# Patient Record
Sex: Female | Born: 1950 | Race: White | Hispanic: No | Smoking: Current every day smoker
Health system: Southern US, Community
[De-identification: ages and names within clinical notes are randomized; demographics above are authoritative.]

## PROBLEM LIST (undated history)

## (undated) DIAGNOSIS — K449 Diaphragmatic hernia without obstruction or gangrene: Secondary | ICD-10-CM

## (undated) DIAGNOSIS — I739 Peripheral vascular disease, unspecified: Secondary | ICD-10-CM

## (undated) DIAGNOSIS — G459 Transient cerebral ischemic attack, unspecified: Secondary | ICD-10-CM

## (undated) DIAGNOSIS — I509 Heart failure, unspecified: Secondary | ICD-10-CM

## (undated) DIAGNOSIS — Z87891 Personal history of nicotine dependence: Secondary | ICD-10-CM

## (undated) DIAGNOSIS — K222 Esophageal obstruction: Secondary | ICD-10-CM

## (undated) DIAGNOSIS — J449 Chronic obstructive pulmonary disease, unspecified: Secondary | ICD-10-CM

## (undated) DIAGNOSIS — E785 Hyperlipidemia, unspecified: Secondary | ICD-10-CM

## (undated) DIAGNOSIS — F329 Major depressive disorder, single episode, unspecified: Secondary | ICD-10-CM

## (undated) DIAGNOSIS — I10 Essential (primary) hypertension: Secondary | ICD-10-CM

## (undated) DIAGNOSIS — I779 Disorder of arteries and arterioles, unspecified: Secondary | ICD-10-CM

## (undated) DIAGNOSIS — F3289 Other specified depressive episodes: Secondary | ICD-10-CM

## (undated) DIAGNOSIS — I2510 Atherosclerotic heart disease of native coronary artery without angina pectoris: Secondary | ICD-10-CM

## (undated) DIAGNOSIS — A0472 Enterocolitis due to Clostridium difficile, not specified as recurrent: Secondary | ICD-10-CM

## (undated) DIAGNOSIS — F411 Generalized anxiety disorder: Secondary | ICD-10-CM

## (undated) DIAGNOSIS — Z923 Personal history of irradiation: Secondary | ICD-10-CM

## (undated) DIAGNOSIS — K219 Gastro-esophageal reflux disease without esophagitis: Secondary | ICD-10-CM

## (undated) DIAGNOSIS — Z9981 Dependence on supplemental oxygen: Secondary | ICD-10-CM

## (undated) DIAGNOSIS — C3490 Malignant neoplasm of unspecified part of unspecified bronchus or lung: Secondary | ICD-10-CM

## (undated) DIAGNOSIS — R51 Headache: Secondary | ICD-10-CM

## (undated) DIAGNOSIS — D126 Benign neoplasm of colon, unspecified: Secondary | ICD-10-CM

## (undated) DIAGNOSIS — I219 Acute myocardial infarction, unspecified: Secondary | ICD-10-CM

## (undated) DIAGNOSIS — R634 Abnormal weight loss: Secondary | ICD-10-CM

## (undated) DIAGNOSIS — K279 Peptic ulcer, site unspecified, unspecified as acute or chronic, without hemorrhage or perforation: Secondary | ICD-10-CM

## (undated) DIAGNOSIS — B3781 Candidal esophagitis: Secondary | ICD-10-CM

## (undated) DIAGNOSIS — J45909 Unspecified asthma, uncomplicated: Secondary | ICD-10-CM

## (undated) DIAGNOSIS — K224 Dyskinesia of esophagus: Secondary | ICD-10-CM

## (undated) DIAGNOSIS — K2940 Chronic atrophic gastritis without bleeding: Secondary | ICD-10-CM

## (undated) DIAGNOSIS — G629 Polyneuropathy, unspecified: Secondary | ICD-10-CM

## (undated) DIAGNOSIS — I4891 Unspecified atrial fibrillation: Secondary | ICD-10-CM

## (undated) DIAGNOSIS — Z9289 Personal history of other medical treatment: Secondary | ICD-10-CM

## (undated) HISTORY — DX: Personal history of other medical treatment: Z92.89

## (undated) HISTORY — DX: Diaphragmatic hernia without obstruction or gangrene: K44.9

## (undated) HISTORY — DX: Candidal esophagitis: B37.81

## (undated) HISTORY — DX: Chronic atrophic gastritis without bleeding: K29.40

## (undated) HISTORY — PX: CARPAL TUNNEL RELEASE: SHX101

## (undated) HISTORY — DX: Transient cerebral ischemic attack, unspecified: G45.9

## (undated) HISTORY — DX: Benign neoplasm of colon, unspecified: D12.6

## (undated) HISTORY — DX: Major depressive disorder, single episode, unspecified: F32.9

## (undated) HISTORY — PX: SUBCLAVIAN VEIN ANGIOPLASTY / STENTING: SHX2453

## (undated) HISTORY — DX: Acute myocardial infarction, unspecified: I21.9

## (undated) HISTORY — DX: Personal history of irradiation: Z92.3

## (undated) HISTORY — DX: Peptic ulcer, site unspecified, unspecified as acute or chronic, without hemorrhage or perforation: K27.9

## (undated) HISTORY — DX: Dyskinesia of esophagus: K22.4

## (undated) HISTORY — DX: Hyperlipidemia, unspecified: E78.5

## (undated) HISTORY — DX: Disorder of arteries and arterioles, unspecified: I77.9

## (undated) HISTORY — DX: Chronic obstructive pulmonary disease, unspecified: J44.9

## (undated) HISTORY — DX: Polyneuropathy, unspecified: G62.9

## (undated) HISTORY — DX: Gastro-esophageal reflux disease without esophagitis: K21.9

## (undated) HISTORY — DX: Peripheral vascular disease, unspecified: I73.9

## (undated) HISTORY — DX: Malignant neoplasm of unspecified part of unspecified bronchus or lung: C34.90

## (undated) HISTORY — DX: Enterocolitis due to Clostridium difficile, not specified as recurrent: A04.72

## (undated) HISTORY — DX: Esophageal obstruction: K22.2

## (undated) HISTORY — DX: Generalized anxiety disorder: F41.1

## (undated) HISTORY — PX: LAMINECTOMY: SHX219

## (undated) HISTORY — DX: Other specified depressive episodes: F32.89

## (undated) HISTORY — DX: Personal history of nicotine dependence: Z87.891

## (undated) HISTORY — PX: BREAST SURGERY: SHX581

## (undated) HISTORY — DX: Headache: R51

## (undated) HISTORY — DX: Abnormal weight loss: R63.4

---

## 1972-05-24 HISTORY — PX: TONSILLECTOMY: SUR1361

## 1974-05-24 HISTORY — PX: MANDIBLE FRACTURE SURGERY: SHX706

## 1994-02-01 HISTORY — PX: CORONARY ARTERY BYPASS GRAFT: SHX141

## 1996-05-24 HISTORY — PX: VAGINAL HYSTERECTOMY: SUR661

## 1997-10-24 ENCOUNTER — Inpatient Hospital Stay (HOSPITAL_COMMUNITY): Admission: EM | Admit: 1997-10-24 | Discharge: 1997-10-25 | Payer: Self-pay | Admitting: Emergency Medicine

## 1997-11-14 ENCOUNTER — Ambulatory Visit (HOSPITAL_COMMUNITY): Admission: RE | Admit: 1997-11-14 | Discharge: 1997-11-14 | Payer: Self-pay | Admitting: Internal Medicine

## 1998-04-04 ENCOUNTER — Encounter: Payer: Self-pay | Admitting: Internal Medicine

## 1998-04-04 ENCOUNTER — Ambulatory Visit (HOSPITAL_COMMUNITY): Admission: RE | Admit: 1998-04-04 | Discharge: 1998-04-04 | Payer: Self-pay | Admitting: Internal Medicine

## 1998-04-13 ENCOUNTER — Emergency Department (HOSPITAL_COMMUNITY): Admission: EM | Admit: 1998-04-13 | Discharge: 1998-04-13 | Payer: Self-pay | Admitting: Emergency Medicine

## 1998-04-13 ENCOUNTER — Encounter: Payer: Self-pay | Admitting: Emergency Medicine

## 1998-04-29 ENCOUNTER — Observation Stay (HOSPITAL_COMMUNITY): Admission: RE | Admit: 1998-04-29 | Discharge: 1998-04-30 | Payer: Self-pay | Admitting: Neurosurgery

## 1998-04-29 ENCOUNTER — Encounter: Payer: Self-pay | Admitting: Neurosurgery

## 1999-02-04 ENCOUNTER — Ambulatory Visit (HOSPITAL_COMMUNITY): Admission: RE | Admit: 1999-02-04 | Discharge: 1999-02-04 | Payer: Self-pay | Admitting: Cardiovascular Disease

## 1999-02-04 ENCOUNTER — Encounter: Payer: Self-pay | Admitting: Cardiovascular Disease

## 1999-05-15 ENCOUNTER — Other Ambulatory Visit: Admission: RE | Admit: 1999-05-15 | Discharge: 1999-05-15 | Payer: Self-pay | Admitting: Internal Medicine

## 1999-05-20 ENCOUNTER — Encounter: Payer: Self-pay | Admitting: Internal Medicine

## 1999-05-20 ENCOUNTER — Encounter: Admission: RE | Admit: 1999-05-20 | Discharge: 1999-05-20 | Payer: Self-pay | Admitting: Internal Medicine

## 1999-06-10 ENCOUNTER — Encounter: Admission: RE | Admit: 1999-06-10 | Discharge: 1999-06-10 | Payer: Self-pay | Admitting: Allergy

## 1999-06-10 ENCOUNTER — Encounter: Payer: Self-pay | Admitting: Allergy

## 1999-06-25 ENCOUNTER — Ambulatory Visit (HOSPITAL_COMMUNITY): Admission: RE | Admit: 1999-06-25 | Discharge: 1999-06-25 | Payer: Self-pay | Admitting: Gastroenterology

## 1999-09-10 ENCOUNTER — Encounter: Payer: Self-pay | Admitting: Emergency Medicine

## 1999-09-10 ENCOUNTER — Encounter: Payer: Self-pay | Admitting: Cardiovascular Disease

## 1999-09-10 ENCOUNTER — Inpatient Hospital Stay (HOSPITAL_COMMUNITY): Admission: EM | Admit: 1999-09-10 | Discharge: 1999-09-16 | Payer: Self-pay | Admitting: Emergency Medicine

## 1999-09-13 ENCOUNTER — Encounter: Payer: Self-pay | Admitting: Cardiovascular Disease

## 1999-09-14 ENCOUNTER — Encounter: Payer: Self-pay | Admitting: *Deleted

## 1999-09-15 ENCOUNTER — Encounter: Payer: Self-pay | Admitting: Cardiovascular Disease

## 2000-02-03 ENCOUNTER — Encounter: Payer: Self-pay | Admitting: Internal Medicine

## 2000-02-03 ENCOUNTER — Encounter: Admission: RE | Admit: 2000-02-03 | Discharge: 2000-02-03 | Payer: Self-pay | Admitting: Internal Medicine

## 2000-05-11 ENCOUNTER — Ambulatory Visit (HOSPITAL_COMMUNITY): Admission: RE | Admit: 2000-05-11 | Discharge: 2000-05-11 | Payer: Self-pay | Admitting: Cardiovascular Disease

## 2000-05-11 ENCOUNTER — Encounter: Payer: Self-pay | Admitting: Cardiovascular Disease

## 2000-08-28 ENCOUNTER — Inpatient Hospital Stay (HOSPITAL_COMMUNITY): Admission: EM | Admit: 2000-08-28 | Discharge: 2000-08-30 | Payer: Self-pay | Admitting: Emergency Medicine

## 2000-08-28 ENCOUNTER — Encounter: Payer: Self-pay | Admitting: Emergency Medicine

## 2000-08-29 HISTORY — PX: CORONARY ANGIOPLASTY WITH STENT PLACEMENT: SHX49

## 2000-10-28 ENCOUNTER — Emergency Department (HOSPITAL_COMMUNITY): Admission: EM | Admit: 2000-10-28 | Discharge: 2000-10-28 | Payer: Self-pay | Admitting: Emergency Medicine

## 2000-10-28 ENCOUNTER — Encounter: Payer: Self-pay | Admitting: Emergency Medicine

## 2000-11-03 ENCOUNTER — Ambulatory Visit (HOSPITAL_COMMUNITY): Admission: RE | Admit: 2000-11-03 | Discharge: 2000-11-03 | Payer: Self-pay | Admitting: Gastroenterology

## 2001-02-07 ENCOUNTER — Inpatient Hospital Stay (HOSPITAL_COMMUNITY): Admission: EM | Admit: 2001-02-07 | Discharge: 2001-02-08 | Payer: Self-pay

## 2001-02-07 ENCOUNTER — Encounter: Payer: Self-pay | Admitting: Cardiovascular Disease

## 2001-02-22 ENCOUNTER — Encounter: Admission: RE | Admit: 2001-02-22 | Discharge: 2001-02-22 | Payer: Self-pay | Admitting: Internal Medicine

## 2001-02-22 ENCOUNTER — Encounter: Payer: Self-pay | Admitting: Internal Medicine

## 2001-02-23 ENCOUNTER — Ambulatory Visit (HOSPITAL_COMMUNITY): Admission: RE | Admit: 2001-02-23 | Discharge: 2001-02-23 | Payer: Self-pay | Admitting: Neurosurgery

## 2001-02-23 ENCOUNTER — Encounter: Payer: Self-pay | Admitting: Neurosurgery

## 2001-02-27 ENCOUNTER — Other Ambulatory Visit: Admission: RE | Admit: 2001-02-27 | Discharge: 2001-02-27 | Payer: Self-pay | Admitting: *Deleted

## 2001-03-06 ENCOUNTER — Encounter: Admission: RE | Admit: 2001-03-06 | Discharge: 2001-06-04 | Payer: Self-pay | Admitting: Anesthesiology

## 2001-06-09 ENCOUNTER — Encounter: Admission: RE | Admit: 2001-06-09 | Discharge: 2001-09-07 | Payer: Self-pay | Admitting: Anesthesiology

## 2001-07-15 ENCOUNTER — Encounter: Payer: Self-pay | Admitting: Emergency Medicine

## 2001-07-15 ENCOUNTER — Emergency Department (HOSPITAL_COMMUNITY): Admission: EM | Admit: 2001-07-15 | Discharge: 2001-07-15 | Payer: Self-pay | Admitting: Emergency Medicine

## 2001-09-22 ENCOUNTER — Encounter: Payer: Self-pay | Admitting: Emergency Medicine

## 2001-09-22 ENCOUNTER — Inpatient Hospital Stay (HOSPITAL_COMMUNITY): Admission: EM | Admit: 2001-09-22 | Discharge: 2001-09-26 | Payer: Self-pay | Admitting: Emergency Medicine

## 2001-09-23 ENCOUNTER — Encounter: Payer: Self-pay | Admitting: Cardiovascular Disease

## 2002-01-03 ENCOUNTER — Observation Stay (HOSPITAL_COMMUNITY): Admission: EM | Admit: 2002-01-03 | Discharge: 2002-01-04 | Payer: Self-pay | Admitting: Emergency Medicine

## 2002-01-03 ENCOUNTER — Encounter: Payer: Self-pay | Admitting: Emergency Medicine

## 2002-01-16 ENCOUNTER — Inpatient Hospital Stay (HOSPITAL_COMMUNITY): Admission: EM | Admit: 2002-01-16 | Discharge: 2002-01-19 | Payer: Self-pay | Admitting: *Deleted

## 2002-01-17 HISTORY — PX: CARDIAC CATHETERIZATION: SHX172

## 2002-01-23 ENCOUNTER — Encounter (INDEPENDENT_AMBULATORY_CARE_PROVIDER_SITE_OTHER): Payer: Self-pay | Admitting: *Deleted

## 2002-01-23 ENCOUNTER — Encounter: Payer: Self-pay | Admitting: Cardiology

## 2002-01-23 ENCOUNTER — Encounter: Admission: RE | Admit: 2002-01-23 | Discharge: 2002-01-23 | Payer: Self-pay | Admitting: Cardiology

## 2002-03-05 ENCOUNTER — Other Ambulatory Visit: Admission: RE | Admit: 2002-03-05 | Discharge: 2002-03-05 | Payer: Self-pay | Admitting: *Deleted

## 2002-06-08 ENCOUNTER — Inpatient Hospital Stay (HOSPITAL_COMMUNITY): Admission: EM | Admit: 2002-06-08 | Discharge: 2002-06-09 | Payer: Self-pay | Admitting: Emergency Medicine

## 2002-06-08 ENCOUNTER — Encounter: Payer: Self-pay | Admitting: Emergency Medicine

## 2003-06-03 ENCOUNTER — Emergency Department (HOSPITAL_COMMUNITY): Admission: EM | Admit: 2003-06-03 | Discharge: 2003-06-03 | Payer: Self-pay | Admitting: Emergency Medicine

## 2003-07-29 ENCOUNTER — Other Ambulatory Visit: Admission: RE | Admit: 2003-07-29 | Discharge: 2003-07-29 | Payer: Self-pay | Admitting: *Deleted

## 2003-08-22 ENCOUNTER — Encounter: Admission: RE | Admit: 2003-08-22 | Discharge: 2003-08-22 | Payer: Self-pay | Admitting: Internal Medicine

## 2003-10-14 ENCOUNTER — Encounter (INDEPENDENT_AMBULATORY_CARE_PROVIDER_SITE_OTHER): Payer: Self-pay | Admitting: *Deleted

## 2003-10-14 ENCOUNTER — Ambulatory Visit (HOSPITAL_COMMUNITY): Admission: RE | Admit: 2003-10-14 | Discharge: 2003-10-14 | Payer: Self-pay | Admitting: Gastroenterology

## 2003-10-31 ENCOUNTER — Ambulatory Visit (HOSPITAL_COMMUNITY): Admission: RE | Admit: 2003-10-31 | Discharge: 2003-10-31 | Payer: Self-pay | Admitting: Gastroenterology

## 2003-10-31 ENCOUNTER — Encounter (INDEPENDENT_AMBULATORY_CARE_PROVIDER_SITE_OTHER): Payer: Self-pay | Admitting: *Deleted

## 2004-02-12 ENCOUNTER — Inpatient Hospital Stay (HOSPITAL_COMMUNITY): Admission: AD | Admit: 2004-02-12 | Discharge: 2004-02-14 | Payer: Self-pay | Admitting: Cardiovascular Disease

## 2004-02-13 HISTORY — PX: CARDIAC CATHETERIZATION: SHX172

## 2004-11-04 ENCOUNTER — Ambulatory Visit: Payer: Self-pay | Admitting: Gastroenterology

## 2004-12-29 ENCOUNTER — Emergency Department (HOSPITAL_COMMUNITY): Admission: EM | Admit: 2004-12-29 | Discharge: 2004-12-29 | Payer: Self-pay | Admitting: Family Medicine

## 2005-01-08 ENCOUNTER — Ambulatory Visit: Payer: Self-pay | Admitting: Gastroenterology

## 2005-01-21 ENCOUNTER — Encounter: Payer: Self-pay | Admitting: Internal Medicine

## 2005-01-21 ENCOUNTER — Encounter (INDEPENDENT_AMBULATORY_CARE_PROVIDER_SITE_OTHER): Payer: Self-pay | Admitting: *Deleted

## 2005-01-21 ENCOUNTER — Ambulatory Visit: Payer: Self-pay | Admitting: Gastroenterology

## 2005-01-21 DIAGNOSIS — D126 Benign neoplasm of colon, unspecified: Secondary | ICD-10-CM | POA: Insufficient documentation

## 2005-01-21 DIAGNOSIS — K649 Unspecified hemorrhoids: Secondary | ICD-10-CM | POA: Insufficient documentation

## 2005-06-08 ENCOUNTER — Emergency Department (HOSPITAL_COMMUNITY): Admission: EM | Admit: 2005-06-08 | Discharge: 2005-06-08 | Payer: Self-pay | Admitting: Emergency Medicine

## 2005-07-13 ENCOUNTER — Observation Stay (HOSPITAL_COMMUNITY): Admission: EM | Admit: 2005-07-13 | Discharge: 2005-07-15 | Payer: Self-pay | Admitting: Emergency Medicine

## 2005-07-14 ENCOUNTER — Ambulatory Visit: Payer: Self-pay | Admitting: Internal Medicine

## 2005-07-14 HISTORY — PX: CARDIAC CATHETERIZATION: SHX172

## 2005-07-15 ENCOUNTER — Encounter (INDEPENDENT_AMBULATORY_CARE_PROVIDER_SITE_OTHER): Payer: Self-pay | Admitting: *Deleted

## 2005-11-17 ENCOUNTER — Emergency Department (HOSPITAL_COMMUNITY): Admission: EM | Admit: 2005-11-17 | Discharge: 2005-11-17 | Payer: Self-pay | Admitting: Family Medicine

## 2006-02-10 ENCOUNTER — Other Ambulatory Visit: Admission: RE | Admit: 2006-02-10 | Discharge: 2006-02-10 | Payer: Self-pay | Admitting: *Deleted

## 2006-03-16 ENCOUNTER — Ambulatory Visit: Payer: Self-pay | Admitting: Gastroenterology

## 2006-03-17 ENCOUNTER — Ambulatory Visit: Payer: Self-pay | Admitting: Gastroenterology

## 2006-03-17 DIAGNOSIS — K2940 Chronic atrophic gastritis without bleeding: Secondary | ICD-10-CM | POA: Insufficient documentation

## 2006-03-17 DIAGNOSIS — K449 Diaphragmatic hernia without obstruction or gangrene: Secondary | ICD-10-CM | POA: Insufficient documentation

## 2006-03-27 ENCOUNTER — Emergency Department (HOSPITAL_COMMUNITY): Admission: EM | Admit: 2006-03-27 | Discharge: 2006-03-27 | Payer: Self-pay | Admitting: Emergency Medicine

## 2006-04-05 ENCOUNTER — Encounter: Admission: RE | Admit: 2006-04-05 | Discharge: 2006-04-05 | Payer: Self-pay | Admitting: Internal Medicine

## 2006-05-24 HISTORY — PX: EPIGASTRIC HERNIA REPAIR: SUR1177

## 2006-07-19 ENCOUNTER — Ambulatory Visit: Payer: Self-pay | Admitting: Gastroenterology

## 2006-07-22 ENCOUNTER — Inpatient Hospital Stay (HOSPITAL_COMMUNITY): Admission: EM | Admit: 2006-07-22 | Discharge: 2006-07-24 | Payer: Self-pay | Admitting: Emergency Medicine

## 2006-07-24 ENCOUNTER — Encounter (INDEPENDENT_AMBULATORY_CARE_PROVIDER_SITE_OTHER): Payer: Self-pay | Admitting: *Deleted

## 2006-08-02 ENCOUNTER — Encounter: Admission: RE | Admit: 2006-08-02 | Discharge: 2006-08-02 | Payer: Self-pay | Admitting: Gastroenterology

## 2006-08-02 ENCOUNTER — Ambulatory Visit: Payer: Self-pay | Admitting: Thoracic Surgery (Cardiothoracic Vascular Surgery)

## 2006-08-23 ENCOUNTER — Ambulatory Visit: Payer: Self-pay | Admitting: Thoracic Surgery (Cardiothoracic Vascular Surgery)

## 2006-08-23 ENCOUNTER — Encounter (INDEPENDENT_AMBULATORY_CARE_PROVIDER_SITE_OTHER): Payer: Self-pay | Admitting: *Deleted

## 2006-08-23 ENCOUNTER — Observation Stay (HOSPITAL_COMMUNITY)
Admission: RE | Admit: 2006-08-23 | Discharge: 2006-08-23 | Payer: Self-pay | Admitting: Thoracic Surgery (Cardiothoracic Vascular Surgery)

## 2006-09-08 ENCOUNTER — Ambulatory Visit: Payer: Self-pay | Admitting: Thoracic Surgery (Cardiothoracic Vascular Surgery)

## 2006-11-01 ENCOUNTER — Encounter
Admission: RE | Admit: 2006-11-01 | Discharge: 2006-11-01 | Payer: Self-pay | Admitting: Thoracic Surgery (Cardiothoracic Vascular Surgery)

## 2006-11-01 ENCOUNTER — Ambulatory Visit: Payer: Self-pay | Admitting: Thoracic Surgery (Cardiothoracic Vascular Surgery)

## 2006-12-13 ENCOUNTER — Ambulatory Visit: Payer: Self-pay | Admitting: Internal Medicine

## 2006-12-13 LAB — CONVERTED CEMR LAB
Basophils Absolute: 0 10*3/uL (ref 0.0–0.1)
Eosinophils Absolute: 0 10*3/uL (ref 0.0–0.6)
Eosinophils Relative: 0.3 % (ref 0.0–5.0)
HCT: 41.5 % (ref 36.0–46.0)
Lymphocytes Relative: 32.1 % (ref 12.0–46.0)
Monocytes Absolute: 0.4 10*3/uL (ref 0.2–0.7)
Neutro Abs: 4.5 10*3/uL (ref 1.4–7.7)
Platelets: 279 10*3/uL (ref 150–400)
RBC: 4.43 M/uL (ref 3.87–5.11)
RDW: 12 % (ref 11.5–14.6)
Sed Rate: 11 mm/hr (ref 0–25)
TSH: 0.92 microintl units/mL (ref 0.35–5.50)
Tissue Transglutaminase Ab, IgA: 0 units (ref <7)
WBC: 7.2 10*3/uL (ref 4.5–10.5)

## 2006-12-15 ENCOUNTER — Encounter: Payer: Self-pay | Admitting: Internal Medicine

## 2006-12-18 ENCOUNTER — Emergency Department (HOSPITAL_COMMUNITY): Admission: EM | Admit: 2006-12-18 | Discharge: 2006-12-18 | Payer: Self-pay | Admitting: Family Medicine

## 2006-12-31 ENCOUNTER — Emergency Department (HOSPITAL_COMMUNITY): Admission: EM | Admit: 2006-12-31 | Discharge: 2006-12-31 | Payer: Self-pay | Admitting: Emergency Medicine

## 2007-02-19 ENCOUNTER — Emergency Department (HOSPITAL_COMMUNITY): Admission: EM | Admit: 2007-02-19 | Discharge: 2007-02-19 | Payer: Self-pay | Admitting: Family Medicine

## 2007-03-06 ENCOUNTER — Ambulatory Visit: Payer: Self-pay | Admitting: Internal Medicine

## 2007-03-21 ENCOUNTER — Ambulatory Visit: Payer: Self-pay | Admitting: Internal Medicine

## 2007-03-21 ENCOUNTER — Encounter: Payer: Self-pay | Admitting: Internal Medicine

## 2007-03-27 ENCOUNTER — Emergency Department (HOSPITAL_COMMUNITY): Admission: EM | Admit: 2007-03-27 | Discharge: 2007-03-27 | Payer: Self-pay | Admitting: Emergency Medicine

## 2007-03-28 ENCOUNTER — Emergency Department (HOSPITAL_COMMUNITY): Admission: EM | Admit: 2007-03-28 | Discharge: 2007-03-28 | Payer: Self-pay | Admitting: Emergency Medicine

## 2007-03-28 ENCOUNTER — Encounter (INDEPENDENT_AMBULATORY_CARE_PROVIDER_SITE_OTHER): Payer: Self-pay | Admitting: *Deleted

## 2007-03-29 ENCOUNTER — Emergency Department (HOSPITAL_COMMUNITY): Admission: EM | Admit: 2007-03-29 | Discharge: 2007-03-29 | Payer: Self-pay | Admitting: Emergency Medicine

## 2007-04-14 ENCOUNTER — Inpatient Hospital Stay (HOSPITAL_COMMUNITY): Admission: RE | Admit: 2007-04-14 | Discharge: 2007-04-20 | Payer: Self-pay | Admitting: Neurosurgery

## 2007-04-23 ENCOUNTER — Inpatient Hospital Stay (HOSPITAL_COMMUNITY): Admission: EM | Admit: 2007-04-23 | Discharge: 2007-04-25 | Payer: Self-pay | Admitting: Emergency Medicine

## 2007-04-28 ENCOUNTER — Emergency Department (HOSPITAL_COMMUNITY): Admission: EM | Admit: 2007-04-28 | Discharge: 2007-04-28 | Payer: Self-pay | Admitting: Emergency Medicine

## 2007-04-30 ENCOUNTER — Inpatient Hospital Stay (HOSPITAL_COMMUNITY): Admission: EM | Admit: 2007-04-30 | Discharge: 2007-05-01 | Payer: Self-pay | Admitting: Emergency Medicine

## 2007-05-06 ENCOUNTER — Emergency Department (HOSPITAL_COMMUNITY): Admission: EM | Admit: 2007-05-06 | Discharge: 2007-05-06 | Payer: Self-pay | Admitting: Emergency Medicine

## 2007-05-22 ENCOUNTER — Observation Stay (HOSPITAL_COMMUNITY): Admission: EM | Admit: 2007-05-22 | Discharge: 2007-05-23 | Payer: Self-pay | Admitting: *Deleted

## 2007-07-30 ENCOUNTER — Encounter: Admission: RE | Admit: 2007-07-30 | Discharge: 2007-07-30 | Payer: Self-pay | Admitting: Neurosurgery

## 2007-07-31 DIAGNOSIS — K279 Peptic ulcer, site unspecified, unspecified as acute or chronic, without hemorrhage or perforation: Secondary | ICD-10-CM | POA: Insufficient documentation

## 2007-07-31 DIAGNOSIS — F329 Major depressive disorder, single episode, unspecified: Secondary | ICD-10-CM

## 2007-07-31 DIAGNOSIS — J45909 Unspecified asthma, uncomplicated: Secondary | ICD-10-CM | POA: Insufficient documentation

## 2007-07-31 DIAGNOSIS — F411 Generalized anxiety disorder: Secondary | ICD-10-CM

## 2007-07-31 DIAGNOSIS — E785 Hyperlipidemia, unspecified: Secondary | ICD-10-CM

## 2007-07-31 DIAGNOSIS — K219 Gastro-esophageal reflux disease without esophagitis: Secondary | ICD-10-CM | POA: Insufficient documentation

## 2007-07-31 DIAGNOSIS — I219 Acute myocardial infarction, unspecified: Secondary | ICD-10-CM | POA: Insufficient documentation

## 2007-07-31 DIAGNOSIS — I2510 Atherosclerotic heart disease of native coronary artery without angina pectoris: Secondary | ICD-10-CM

## 2007-08-09 ENCOUNTER — Emergency Department (HOSPITAL_COMMUNITY): Admission: EM | Admit: 2007-08-09 | Discharge: 2007-08-09 | Payer: Self-pay | Admitting: Emergency Medicine

## 2007-08-24 ENCOUNTER — Encounter: Admission: RE | Admit: 2007-08-24 | Discharge: 2007-09-21 | Payer: Self-pay | Admitting: Neurosurgery

## 2008-01-25 ENCOUNTER — Emergency Department (HOSPITAL_COMMUNITY): Admission: EM | Admit: 2008-01-25 | Discharge: 2008-01-25 | Payer: Self-pay | Admitting: Emergency Medicine

## 2008-06-30 ENCOUNTER — Emergency Department (HOSPITAL_COMMUNITY): Admission: EM | Admit: 2008-06-30 | Discharge: 2008-06-30 | Payer: Self-pay | Admitting: Emergency Medicine

## 2008-09-20 ENCOUNTER — Observation Stay (HOSPITAL_COMMUNITY): Admission: EM | Admit: 2008-09-20 | Discharge: 2008-09-21 | Payer: Self-pay | Admitting: Emergency Medicine

## 2008-09-23 ENCOUNTER — Telehealth: Payer: Self-pay | Admitting: Internal Medicine

## 2008-09-26 ENCOUNTER — Ambulatory Visit (HOSPITAL_COMMUNITY): Admission: RE | Admit: 2008-09-26 | Discharge: 2008-09-26 | Payer: Self-pay | Admitting: Cardiovascular Disease

## 2008-09-26 HISTORY — PX: CARDIAC CATHETERIZATION: SHX172

## 2009-01-10 ENCOUNTER — Encounter (INDEPENDENT_AMBULATORY_CARE_PROVIDER_SITE_OTHER): Payer: Self-pay | Admitting: *Deleted

## 2009-05-15 ENCOUNTER — Encounter: Admission: RE | Admit: 2009-05-15 | Discharge: 2009-05-15 | Payer: Self-pay | Admitting: Internal Medicine

## 2009-05-24 DIAGNOSIS — I219 Acute myocardial infarction, unspecified: Secondary | ICD-10-CM

## 2009-05-24 DIAGNOSIS — I509 Heart failure, unspecified: Secondary | ICD-10-CM

## 2009-05-24 HISTORY — DX: Heart failure, unspecified: I50.9

## 2009-05-24 HISTORY — DX: Acute myocardial infarction, unspecified: I21.9

## 2009-05-31 ENCOUNTER — Ambulatory Visit: Payer: Self-pay | Admitting: Internal Medicine

## 2009-05-31 ENCOUNTER — Inpatient Hospital Stay (HOSPITAL_COMMUNITY): Admission: EM | Admit: 2009-05-31 | Discharge: 2009-06-07 | Payer: Self-pay | Admitting: Emergency Medicine

## 2009-05-31 HISTORY — PX: CARDIAC CATHETERIZATION: SHX172

## 2009-06-02 ENCOUNTER — Encounter (INDEPENDENT_AMBULATORY_CARE_PROVIDER_SITE_OTHER): Payer: Self-pay | Admitting: Cardiovascular Disease

## 2009-06-04 ENCOUNTER — Encounter: Payer: Self-pay | Admitting: Internal Medicine

## 2009-06-18 ENCOUNTER — Encounter: Payer: Self-pay | Admitting: Internal Medicine

## 2009-07-25 ENCOUNTER — Inpatient Hospital Stay (HOSPITAL_COMMUNITY): Admission: EM | Admit: 2009-07-25 | Discharge: 2009-07-29 | Payer: Self-pay | Admitting: Emergency Medicine

## 2009-07-29 ENCOUNTER — Encounter (INDEPENDENT_AMBULATORY_CARE_PROVIDER_SITE_OTHER): Payer: Self-pay | Admitting: *Deleted

## 2009-08-14 HISTORY — PX: TRANSTHORACIC ECHOCARDIOGRAM: SHX275

## 2009-08-14 HISTORY — PX: OTHER SURGICAL HISTORY: SHX169

## 2009-08-20 ENCOUNTER — Encounter: Admission: RE | Admit: 2009-08-20 | Discharge: 2009-08-20 | Payer: Self-pay | Admitting: Neurosurgery

## 2009-08-22 ENCOUNTER — Encounter: Payer: Self-pay | Admitting: Internal Medicine

## 2009-10-02 ENCOUNTER — Observation Stay (HOSPITAL_COMMUNITY): Admission: EM | Admit: 2009-10-02 | Discharge: 2009-10-03 | Payer: Self-pay | Admitting: Emergency Medicine

## 2009-10-02 ENCOUNTER — Encounter (INDEPENDENT_AMBULATORY_CARE_PROVIDER_SITE_OTHER): Payer: Self-pay | Admitting: *Deleted

## 2009-10-03 ENCOUNTER — Encounter (INDEPENDENT_AMBULATORY_CARE_PROVIDER_SITE_OTHER): Payer: Self-pay | Admitting: *Deleted

## 2009-10-03 HISTORY — PX: CARDIAC CATHETERIZATION: SHX172

## 2009-11-03 ENCOUNTER — Ambulatory Visit: Payer: Self-pay | Admitting: Internal Medicine

## 2009-11-06 ENCOUNTER — Ambulatory Visit: Payer: Self-pay | Admitting: Internal Medicine

## 2009-11-07 ENCOUNTER — Telehealth: Payer: Self-pay | Admitting: Internal Medicine

## 2009-11-10 ENCOUNTER — Encounter: Payer: Self-pay | Admitting: Internal Medicine

## 2009-12-03 ENCOUNTER — Telehealth: Payer: Self-pay | Admitting: Internal Medicine

## 2009-12-04 ENCOUNTER — Ambulatory Visit: Payer: Self-pay | Admitting: Internal Medicine

## 2009-12-04 DIAGNOSIS — B3781 Candidal esophagitis: Secondary | ICD-10-CM | POA: Insufficient documentation

## 2009-12-04 DIAGNOSIS — R195 Other fecal abnormalities: Secondary | ICD-10-CM

## 2010-02-06 ENCOUNTER — Emergency Department (HOSPITAL_COMMUNITY): Admission: EM | Admit: 2010-02-06 | Discharge: 2010-02-06 | Payer: Self-pay | Admitting: Family Medicine

## 2010-02-17 ENCOUNTER — Telehealth: Payer: Self-pay | Admitting: Nurse Practitioner

## 2010-03-25 ENCOUNTER — Ambulatory Visit: Payer: Self-pay | Admitting: Internal Medicine

## 2010-03-25 DIAGNOSIS — J449 Chronic obstructive pulmonary disease, unspecified: Secondary | ICD-10-CM

## 2010-03-25 DIAGNOSIS — R05 Cough: Secondary | ICD-10-CM

## 2010-03-25 DIAGNOSIS — J4489 Other specified chronic obstructive pulmonary disease: Secondary | ICD-10-CM | POA: Insufficient documentation

## 2010-04-29 ENCOUNTER — Ambulatory Visit: Payer: Self-pay | Admitting: Internal Medicine

## 2010-05-01 ENCOUNTER — Telehealth: Payer: Self-pay | Admitting: Internal Medicine

## 2010-05-28 ENCOUNTER — Ambulatory Visit: Admit: 2010-05-28 | Payer: Self-pay | Admitting: Internal Medicine

## 2010-05-29 ENCOUNTER — Telehealth: Payer: Self-pay | Admitting: Internal Medicine

## 2010-06-11 ENCOUNTER — Telehealth (INDEPENDENT_AMBULATORY_CARE_PROVIDER_SITE_OTHER): Payer: Self-pay | Admitting: *Deleted

## 2010-06-15 ENCOUNTER — Ambulatory Visit
Admission: RE | Admit: 2010-06-15 | Discharge: 2010-06-15 | Payer: Self-pay | Source: Home / Self Care | Attending: Internal Medicine | Admitting: Internal Medicine

## 2010-06-15 ENCOUNTER — Encounter: Payer: Self-pay | Admitting: Internal Medicine

## 2010-06-17 ENCOUNTER — Encounter
Admission: RE | Admit: 2010-06-17 | Discharge: 2010-06-17 | Payer: Self-pay | Source: Home / Self Care | Attending: Orthopaedic Surgery | Admitting: Orthopaedic Surgery

## 2010-06-23 NOTE — Assessment & Plan Note (Signed)
Summary: Pulmonary/ new pt eval for copd/ab > start symbicort, hfa 50%   Copy to:  Dr. Beaulah Dinning Primary Provider/Referring Provider:  Larina Earthly, MD   CC:  Dyspnea.  History of Present Illness: 43 yowf quit smoking January 2011 admit for heart by Dr [name]   March 25, 2010 cc doe overall better than at [facility] but can't get from one room to next despite using spiriva daily and proventil hfa twice daily. wakes up every day with very congested cough> mucoid thick sputum and can't get clear until uses am proventil but not typically waking up at night with resp issues.    Pt denies any significant sore throat, dysphagia, itching, sneezing,  nasal congestion or excess secretions,  fever, chills, sweats, unintended wt loss, pleuritic or exertional cp, hempoptysis, change in activity tolerance  orthopnea pnd or leg swelling Pt also denies any obvious fluctuation in symptoms with weather or environmental change or other alleviating or aggravating factors.       Current Medications (verified): 1)  Nitrostat 0.4 Mg Subl (Nitroglycerin) .... As Needed 2)  Hydrocodone-Acetaminophen 5-325 Mg Tabs (Hydrocodone-Acetaminophen) .... As Needed For Pain 3)  Bayer Low Strength 81 Mg Tbec (Aspirin) .... One Tablet By Mouth Once Daily 4)  Effient 10 Mg Tabs (Prasugrel Hcl) .... One Tablet By Mouth Once Daily 5)  Lipitor 80 Mg Tabs (Atorvastatin Calcium) .... One Tablet By Mouth Once Daily 6)  Zetia 10 Mg Tabs (Ezetimibe) .... One Tablet By Mouth Once Daily 7)  Coreg 3.125 Mg Tabs (Carvedilol) .... One Tablet By Mouth Two Times A Day 8)  Singulair 10 Mg Tabs (Montelukast Sodium) .... One Tablet By Mouth Once Daily 9)  Nasonex 50 Mcg/act Susp (Mometasone Furoate) .... As Directed 10)  Proventil Hfa 108 (90 Base) Mcg/act Aers (Albuterol Sulfate) .... As Directed 11)  Spiriva Handihaler 18 Mcg Caps (Tiotropium Bromide Monohydrate) .Marland Kitchen.. 1 Puff Daily 12)  Nexium 40 Mg Cpdr (Esomeprazole Magnesium) .Marland Kitchen..  1 By Mouth Two Times A Day  Allergies (verified): 1)  ! * Amiodarone 2)  ! * Multaq 3)  ! 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) HEMORRHOIDS (ICD-455.6) POLYP, COLON (ICD-211.3) ESOPHAGEAL STRICTURE (ICD-530.3) HIATAL HERNIA (ICD-553.3) GASTRITIS, CHRONIC (ICD-535.10) CAD (ICD-414.00) PERIPHERAL NEUROPATHY (ICD-356.9) HYPERLIPIDEMIA (ICD-272.4) PEPTIC ULCER DISEASE (ICD-533.90) ATHEROSCLEROTIC CARDIOVASCULAR DISEASE (ICD-429.2) DEPRESSION (ICD-311) ANXIETY (ICD-300.00) COPD..................................................................Marland KitchenWert     - PFt's rec March 25, 2010  GERD (ICD-530.81)  Family History: Family History of Heart Disease: Mother, Sister, Father No FH of Colon Cancer: Lung Cancer, Father  Social History: Patient is a former smoker. Quit in Jan 2011- smoked for 30 trs up to 2 ppd Alcohol Use - no Illicit Drug Use - no Married   1 boy and 1 girl Disabled with back problems  Review of Systems       The patient complains of shortness of breath with activity, shortness of breath at rest, productive cough, acid heartburn, indigestion, loss of appetite, weight change, difficulty swallowing, sore throat, joint stiffness or pain, and change in color of mucus.  The patient denies non-productive cough, coughing up blood, chest pain, irregular heartbeats, abdominal pain, tooth/dental problems, headaches, nasal congestion/difficulty breathing through nose, sneezing, itching, ear ache, anxiety, depression, hand/feet swelling, rash, and fever.    Vital Signs:  Patient profile:   60 year old female Weight:      115.13 pounds O2 Sat:      98 % on  Room air Temp:     97.7 degrees F oral Pulse rate:   66 / minute BP sitting:   102 / 64  (left arm)  Vitals Entered By: Vernie Murders (March 25, 2010 9:50 AM)  O2 Flow:  Room air  Physical Exam  Additional Exam:  thin amb wf  with peculiar affect and congested sounding cough wt 115 March 25, 2010 HEENT mild turbinate edema.  Oropharynx no thrush or excess pnd or cobblestoning.  No JVD or cervical adenopathy. Mild accessory muscle hypertrophy. Trachea midline, nl thryroid. Chest was hyperinflated by percussion with diminished breath sounds and moderate increased exp time without wheeze. Hoover sign positive at mid inspiration. Regular rate and rhythm without murmur gallop or rub or increase P2 or edema.  Abd: no hsm, nl excursion. Ext warm without cyanosis or clubbing.     CXR  Procedure date:  03/25/2010  Findings:      Comparison: 10/02/2009   Findings: Trachea is midline.  Heart size normal.  Lungs are hyperinflated but clear.  No pleural fluid.  Postoperative changes are seen in the spine.   IMPRESSION: No acute findings.  Impression & Recommendations:  Problem # 1:  COPD UNSPECIFIED (ICD-496) DDX of  difficult airways managment all start with A and  include Adherence, Ace Inhibitors, Acid Reflux, Active Sinus Disease, Alpha 1 Antitripsin deficiency, Anxiety masquerading as Airways dz,  ABPA,  allergy(esp in young), Aspiration (esp in elderly), Adverse effects of DPI,  Active smokers, plus one B  = Beta blocker use.. and one C=  CHF  Active smoking stopped per pt.  Adherence:  I spent extra time with the patient today explaining optimal mdi  technique.  This improved from  25-50% - since using so much proventil would be better off using symbicort two times a day and try to reduce proaire rx  Acid reflux: optimal rx, diet reviewed  Medications Added to Medication List This Visit: 1)  Nexium 40 Mg Cpdr (Esomeprazole magnesium) .... Take one 30-60 min before first and last meals of the day 2)  Symbicort 160-4.5 Mcg/act Aero (Budesonide-formoterol fumarate) .... 2 puffs first thing  in am and 2 puffs again in pm about 12 hours later  Other Orders: T-2 View CXR (71020TC) New Patient Level V  ([address])  Patient Instructions: 1)  symbicort 160 2 puffs first thing  in am and 2 puffs again in pm about 12 hours later 2)  Change nexium to Take one 30-60 min before first and last meals of the day  3)  mucinex as needed for cough, take per bottle  4)  Please schedule a follow-up appointment in 4 weeks, sooner if needed  5)  GERD (REFLUX)  is a common cause of respiratory symptoms. It commonly presents without heartburn and can be treated with medication, but also with lifestyle changes including avoidance of late meals, excessive alcohol, smoking cessation, and avoid fatty foods, chocolate, peppermint, colas, red wine, and acidic juices such as orange juice. NO MINT OR MENTHOL PRODUCTS SO NO COUGH DROPS  6)  USE SUGARLESS CANDY INSTEAD (jolley ranchers)  7)  NO OIL BASED VITAMINS

## 2010-06-23 NOTE — Letter (Signed)
Summary: Patient [facility] Biopsy Results  [facility]  [address] [place], [place] [address]   Phone: [phone]  Fax: [phone]        November 10, 2009 MRN: [id number]    DONTE KARY [address] [place], [place]  [address]    Dear Ms. Kelter,  I am pleased to inform you that the biopsies taken during your recent endoscopic examination did not show any evidence of cancer upon pathologic examination.Yeast infection found on the biopsies  Additional information/recommendations:  __No further action is needed at this time.  Please follow-up with      your primary care physician for your other healthcare needs.  __ Please call [phone] to schedule a return visit to review      your condition.  _x_ Continue with the treatment plan as outlined on the day of your      exam.  __   Please call us if you are having persistent problems or have questions about your condition that have not been fully answered at this time.  Sincerely,  Hart Carwin MD  This letter has been electronically signed by your physician.  Appended Document: Patient Notice-Endo Biopsy Results letter mailed.

## 2010-06-23 NOTE — Assessment & Plan Note (Signed)
Summary: YEAST IN MOUTH/THROAT, DARK, FOUL SMELLING STOOLS   (DR.BRODI...    History of Present Illness Visit Type: Follow-up Visit Primary GI MD: [name] MD Primary Provider: Larina Earthly, MD  Requesting Provider: [name], MD Chief Complaint: c/o yeast in mouth and throat, BMs are black and stringy History of Present Illness:   Patient is a 60 year old female known to Dr. [name] for non-cardiac chest pain, candida esophagitis (recent), C-difficile, chronic intermittent diarrhea. She has a complicated cardiac history.   A month ago patient underwent EGD for recurrent chest pain and dysphagia with history of stricture. Found to have candida esophagitis, took five days of Diflucan with resolution of symptoms.Then, a week or so later she developed recurrent symptoms. Now her tongue and gums hurt and has visible thrush.  Uses steroid inhalers but has done so for years and rinses after use. No recent antibiotics or Prednisone.  Not diabetic.  Complains of black stringy, foul smelling stools.    GI Review of Systems      Denies abdominal pain, acid reflux, belching, bloating, chest pain, dysphagia with liquids, dysphagia with solids, heartburn, loss of appetite, nausea, vomiting, vomiting blood, weight loss, and  weight gain.      Reports black tarry stools, change in bowel habits, and  diarrhea.     Denies anal fissure, constipation, diverticulosis, fecal incontinence, heme positive stool, hemorrhoids, irritable bowel syndrome, jaundice, light color stool, liver problems, rectal bleeding, and  rectal pain.    Current Medications (verified): 1)  Nitrostat 0.4 Mg Subl (Nitroglycerin) .... As Needed 2)  Hydrocodone-Acetaminophen 5-325 Mg Tabs (Hydrocodone-Acetaminophen) .... As Needed For Pain 3)  Bayer Low Strength 81 Mg Tbec (Aspirin) .... One Tablet By Mouth Once Daily 4)  Effient 10 Mg Tabs (Prasugrel Hcl) .... One Tablet By Mouth Once Daily 5)  Lipitor 80 Mg Tabs (Atorvastatin  Calcium) .... One Tablet By Mouth Once Daily 6)  Zetia 10 Mg Tabs (Ezetimibe) .... One Tablet By Mouth Once Daily 7)  Folbic 2.5-25-2 Mg Tabs (Fa-Pyridoxine-Cyancobalamin) .... One Tablet By Mouth Once Daily 8)  Coreg 3.125 Mg Tabs (Carvedilol) .... One Tablet By Mouth Two Times A Day 9)  Singulair 10 Mg Tabs (Montelukast Sodium) .... One Tablet By Mouth Once Daily 10)  Nasonex 50 Mcg/act Susp (Mometasone Furoate) .... As Directed 11)  Proventil Hfa 108 (90 Base) Mcg/act Aers (Albuterol Sulfate) .... As Directed 12)  Carafate 1 Gm/43ml Susp (Sucralfate) .... Take 2 Teaspoons By Mouth Two Times A Day 13)  Spiriva Handihaler 18 Mcg Caps (Tiotropium Bromide Monohydrate) .Marland Kitchen.. 1 Puff Daily 14)  Nexium 40 Mg Cpdr (Esomeprazole Magnesium) .Marland Kitchen.. 1 By Mouth Two Times A Day  Allergies (verified): 1)  ! * Amiodarone 2)  ! * Multaq 3)  ! * Plavix  Past History:  Past Medical History: Reviewed history from 07/31/2007 and no changes required. 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 Family History of Heart Disease: Mother, Sister,  Father No FH of  Colon Cancer:  Social History: Reviewed history from 10/29/2009 and no changes required. Patient is a former smoker.  Alcohol Use - no Illicit Drug Use - no Married   1 boy and 1 girl  Review of Systems       The patient complains of cough and sore throat.  The patient denies allergy/sinus, anemia, anxiety-new, arthritis/joint pain, back pain, blood in urine, breast changes/lumps, change in vision, confusion, coughing up blood, depression-new, fainting, fatigue, fever, headaches-new, hearing problems, heart murmur, heart rhythm changes, itching, muscle pains/cramps, night sweats, nosebleeds, shortness of breath, skin rash, sleeping problems, swelling of feet/legs, swollen lymph glands, thirst - excessive, urination - excessive, urination changes/pain, urine leakage, vision changes, and voice change.    Vital Signs:  Patient profile:   60 year old female Height:      66 inches Weight:      115 pounds BMI:     18.63 Pulse rate:   64 / minute Pulse rhythm:   regular BP sitting:   78 / 48  (left arm)  Vitals Entered By: Milford Cage NCMA (December 04, 2009 9:51 AM)  Physical Exam  General:  Well developed, well nourished, no acute distress. Head:  Normocephalic and atraumatic. Eyes:  Conjunctiva pink, no icterus.  Mouth:  Thick white plaque covering erythematous tongue Neck:  no obvious masses  Lungs:  Clear throughout to auscultation. Heart:  Regular rate and rhythm; no murmurs, rubs,  or bruits. Abdomen:  Abdomen soft, nontender, nondistended. No obvious masses or hepatomegaly.Normal bowel sounds.  Rectal:  No external or internal lesions appreciated. Stool light [place], heme negative.  Msk:  Symmetrical with no gross deformities. Normal posture. Neurologic:  Alert and  oriented x4;  grossly normal neurologically. Skin:  Intact without significant lesions or rashes. Cervical Nodes:  No significant cervical adenopathy. Psych:  Alert and cooperative. Normal mood and  affect.   Impression & Recommendations:  Problem # 1:  CANDIDIASIS, ESOPHAGEAL (ICD-112.84) Assessment Deteriorated Recurrent, not sure why. Encouraged her to continue rinsing mouth out well after steroid inhalers. Will give another course of Diflucan, seven days this time. Also, add Nystatin swish and swallow. If refractory to treatment patient may need different anti-fungal agent, possibly intravenously.   Problem # 2:  ATHEROSCLEROTIC CARDIOVASCULAR DISEASE (ICD-429.2) Assessment: Comment Only  Problem # 3:  CAD (ICD-414.00) Assessment: Comment Only Remote CABG. Acute MI in January 2011.   Problem # 4:  GERD (ICD-530.81) Assessment: Comment Only Continue PPI.   Problem # 5:  NONSPECIFIC ABNORMAL FINDING IN STOOL CONTENTS (ICD-792.1) Assessment: Comment Only Complains of stringy, black stool (no Pepto or iron products).  On exam stool is light [place] and heme negative so gastrointestinal bleed not likely.  Patient will continue to monitor stools as she is on Effient.   Patient Instructions: 1)  We phone in prescriptions for Nystatin swish and swallow and Diflucan tablets to [facility]. 2)  Nexium 40 Mg capsule samples. 3)  Copy sent to : Marijo File, MD  4)                         [name], Md 5)  The medication list was reviewed and reconciled.  All changed / newly prescribed medications were explained.  A complete medication list was provided to the patient / caregiver. Prescriptions: NYSTATIN 100000 UNIT/ML SUSP (NYSTATIN) Swish and swallow 5 cc 4 times daily  #300 cc x 1  Entered by:   Lowry Ram NCMA   Authorized by:   Willette Cluster NP   Signed by:   Lowry Ram NCMA on 12/04/2009   Method used:   Telephoned to ...       [id number]       [address]       [place], [place]  [address]       Ph: [phone]       Fax: [phone]   RxID:   [id number] DIFLUCAN 100 MG TABS (FLUCONAZOLE) Take 1 tab  daily x 7 days  #7 x 0   Entered by:   Lowry Ram NCMA   Authorized by:   Willette Cluster NP   Signed by:   Lowry Ram NCMA on 12/04/2009   Method used:   Telephoned to ...       [id number]       [address]       [place], [place]  [address]       Ph: [phone]       Fax: [phone]   RxID:   [id number]

## 2010-06-23 NOTE — Assessment & Plan Note (Signed)
Summary: Pulmonary/ ext ov with hfa 50% p coaching   Copy to:  Dr. Beaulah Dinning Primary Provider/Referring Provider:  Larina Earthly, MD   CC:  Dyspnea-somewhat improved.  History of Present Illness: 60 yowf quit smoking January 2011 when admit for heart problems  by Dr [name]   March 25, 2010 cc doe overall better than [facility] but can't get from one room to next despite using spiriva daily and proventil hfa twice daily. wakes up every day with very congested cough> mucoid thick sputum and can't get clear until uses am proventil but not typically waking up at night with resp issues.   rec symbicort 160 2 puffs first thing  in am and 2 puffs again in pm about 12 hours later Change nexium to Take one 30-60 min before first and last meals of the day  mucinex as needed for cough, take per bottle > can't really   GERD diet   April 29, 2010 ov Dyspnea-somewhat improved, overall satisfied with less sob, less cough and no need for saba.  Pt denies any significant sore throat, dysphagia, itching, sneezing,  nasal congestion or excess secretions,  fever, chills, sweats, unintended wt loss, pleuritic or exertional cp, hempoptysis, change in activity tolerance  orthopnea pnd or leg swelling Pt also denies any obvious fluctuation in symptoms with weather or environmental change or other alleviating or aggravating factors.         Clinical Reports Reviewed:  CXR:  03/25/2010: CXR Results:  Comparison: 10/02/2009   Findings: Trachea is midline.  Heart size normal.  Lungs are hyperinflated but clear.  No pleural fluid.  Postoperative changes are seen in the spine.   IMPRESSION: No acute findings.   Current Medications (verified): 1)  Bayer Low Strength 81 Mg Tbec (Aspirin) .... One Tablet By Mouth Once Daily 2)  Effient 10 Mg Tabs (Prasugrel Hcl) .... One Tablet By Mouth Once Daily 3)  Lipitor 80 Mg Tabs (Atorvastatin Calcium) .... One Tablet By Mouth Once Daily 4)  Zetia 10 Mg  Tabs (Ezetimibe) .... One Tablet By Mouth Once Daily 5)  Coreg 3.125 Mg Tabs (Carvedilol) .... One Tablet By Mouth Two Times A Day 6)  Singulair 10 Mg Tabs (Montelukast Sodium) .... One Tablet By Mouth Once Daily 7)  Nasonex 50 Mcg/act Susp (Mometasone Furoate) .... As Directed 8)  Spiriva Handihaler 18 Mcg Caps (Tiotropium Bromide Monohydrate) .Marland Kitchen.. 1 Puff Daily 9)  Nexium 40 Mg Cpdr (Esomeprazole Magnesium) .... Take One 30-60 Min Before First and Last Meals of The Day 10)  Symbicort 160-4.5 Mcg/act  Aero (Budesonide-Formoterol Fumarate) .... 2 Puffs First Thing  in Am and 2 Puffs Again in Pm About 12 Hours Later 11)  Proventil Hfa 108 (90 Base) Mcg/act Aers (Albuterol Sulfate) .... As Directed 12)  Nitrostat 0.4 Mg Subl (Nitroglycerin) .... As Needed 13)  Hydrocodone-Acetaminophen 5-325 Mg Tabs (Hydrocodone-Acetaminophen) .... As Needed For Pain 14)  Mucinex Dm 30-600 Mg Xr12h-Tab (Dextromethorphan-Guaifenesin) .... Per Bottle As Needed  Allergies (verified): 1)  ! * Amiodarone 2)  ! * Multaq 3)  ! 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) HEMORRHOIDS (ICD-455.6) POLYP, COLON (ICD-211.3) ESOPHAGEAL STRICTURE (ICD-530.3) HIATAL HERNIA (ICD-553.3) GASTRITIS, CHRONIC (ICD-535.10) CAD (ICD-414.00) PERIPHERAL NEUROPATHY (ICD-356.9) HYPERLIPIDEMIA (ICD-272.4) PEPTIC ULCER DISEASE (ICD-533.90) ATHEROSCLEROTIC CARDIOVASCULAR DISEASE (ICD-429.2) DEPRESSION (ICD-311) ANXIETY (ICD-300.00) COPD..................................................................Marland KitchenWert     - PFt's rec  April 29, 2010      - [facility]  50% p coaching April 29, 2010  GERD (ICD-530.81)  Vital Signs:  Patient profile:   60 year old female Weight:      115.38 pounds O2 Sat:      96 % on Room air Temp:     97.9 degrees F oral Pulse rate:   73 / minute BP sitting:   112 / 80  (left arm)  Vitals Entered By: Vernie Murders  (April 29, 2010 9:36 AM)  O2 Flow:  Room air  Physical Exam  Additional Exam:  thin amb wf with peculiar affect and minimal congested sounding cough wt 115 March 25, 2010 > 115 December 7 , 2011  HEENT mild turbinate edema.  Oropharynx no thrush or excess pnd or cobblestoning.  No JVD or cervical adenopathy. Mild accessory muscle hypertrophy. Trachea midline, nl thryroid. Chest was hyperinflated by percussion with diminished breath sounds and moderate increased exp time without wheeze. Hoover sign positive at mid inspiration. Regular rate and rhythm without murmur gallop or rub or increase P2 or edema.  Abd: no hsm, nl excursion. Ext warm without cyanosis or clubbing.     Impression & Recommendations:  Problem # 1:  COPD UNSPECIFIED (ICD-496) DDX of  difficult airways managment all start with A and  include Adherence, Ace Inhibitors, Acid Reflux, Active Sinus Disease, Alpha 1 Antitripsin deficiency, Anxiety masquerading as Airways dz,  ABPA,  allergy(esp in young), Aspiration (esp in elderly), Adverse effects of DPI,  Active smokers, plus one B  = Beta blocker use.. and one C=  CHF  Active smoking stopped per pt.  Adherence:  I spent extra time with the patient today explaining optimal mdi  technique.  This improved from  25-50%  but still needs work   confused with meds and inhalers. discussed better organization with her.   Acid reflux: optimal rx, diet reviewed  Baseline pft's requested next ov    Each maintenance medication was reviewed in detail including most importantly the difference between maintenance and as needed and under what circumstances the prns are to be used. See instructions for specific recommendations   Medications Added to Medication List This Visit: 1)  Proventil Hfa 108 (90 Base) Mcg/act Aers (Albuterol sulfate) .... 2 puffs up to every 4 hours 2)  Mucinex Dm 30-600 Mg Xr12h-tab (Dextromethorphan-guaifenesin) .... Per bottle as needed 3)  Nasonex 50  Mcg/act Susp (Mometasone furoate) .Marland Kitchen.. 1-2 puffs every 12 hours as needed 4)  Prednisone 10 Mg Tabs (Prednisone) .... 4 each am x 2days, 2x2days, 1x2days and stop  Other Orders: Est. Patient Level IV ([address])  Patient Instructions: 1)  Work on inhaler technique:  relax and blow all the way out then take a nice smooth deep breath back in, triggering the inhaler at same time you start breathing in and hold for a few seconds 2)  only proventil if needed 3)  stop qvar 4)  Prednisone 4 each am x 2days, 2x2days, 1x2days and stop  5)   Think of your medications in 3 separate categories and keep them separate and bring them back in separate bags  6)  a )  The ones you take no matter what daily on a scheduled basis 7)  b)  The ones you only take if needed for specific problems 8)  c)   The ones you take for a short course and stop, like antibiotics and prednisone. 9)  Please schedule a follow-up appointment in 4 weeks, sooner if needed with pft's on return  10)    Prescriptions: PREDNISONE 10 MG  TABS (PREDNISONE) 4 each am x 2days, 2x2days, 1x2days and stop  #14 x 0   Entered and Authorized by:   Nyoka Cowden MD   Signed by:   Nyoka Cowden MD on 04/29/2010   Method used:   [facility]ronically to        [phone]       [address]       [place], [place]  [address]       Ph: [phone]       Fax: [phone]   RxID:   [id number]

## 2010-06-23 NOTE — Op Note (Signed)
Summary: Epigastric Hernia   NAME:  [name], [name]               ACCOUNT NO.:  [id number]   MEDICAL RECORD NO.:  [id number]          PATIENT TYPE:  INP   LOCATION:  2899                         FACILITY:  [facility]   PHYSICIAN:  Salvatore Decent. Dorris Fetch, M.D.DATE OF BIRTH:  Aug 01, 1950   DATE OF PROCEDURE:  08/23/2006  DATE OF DISCHARGE:  08/23/2006                               OPERATIVE REPORT   PREOPERATIVE DIAGNOSIS:  Epigastric hernia.   POSTOPERATIVE DIAGNOSIS:  Epigastric hernia.   PROCEDURE:  Repair of epigastric hernia with Prolene mesh.   SURGEON:  Salvatore Decent. Dorris Fetch, M.D.   ASSISTANT:  General.   FINDINGS:  Small defect in the subxiphoid location, no clearly defined  edges, diastasis of the rectus sheath.   CLINICAL NOTE:  [name] is a 60 year old female with a history of  coronary bypass grafting in the past by Dr. Particia Lather. She recently  has been having difficulty with pain in the epigastric area.  She has  been through an extensive workup with no identifiable cause.  Recently,  the patient has been experiencing the sensation that her intestine is  getting caught just below the bottom of her breast bone. She feels a  lump there which then will subsequently subside with relief of her  symptoms.  On examination, there was no clearly defined hernia defect.  The patient was advised that exploration of the area was possible or she  could be managed conservatively.  She strongly wished to proceed with  exploration with a plan to repair any defect with mesh. The indications,  risks, benefits and alternatives were discussed in detail with the  patient.  She did see Dr. [name] and have cardiac clearance  prior to the procedure.   OPERATIVE NOTE:  [name] was brought the preop holding area on August 23, 2006.  There, intravenous access was obtained by anesthesia.  Intravenous antibiotics were administered.  The patient had PAS hose  placed for DVT prophylaxis.   She was taken to the operating room,  anesthetized, and intubated without incident.  The chest and abdomen  were prepped and draped in the usual sterile fashion.   An incision was made over the subxiphoid area and carried onto the upper  epigastrium.  It was carried through the skin and subcutaneous tissue.  The tissue overlying the rectus sheath was elevated.  There was a small  defect without clearly defined edges just below the xiphoid. There were  no herniated contents present.  There was thinning of the fascia over  the anterior/superior portion of the rectus muscle bilaterally. The  relatively small defect could not be closed primarily. The area then was  covered with a polypropylene mesh and the smaller defect was sutured in  the midline with running 3-0 Prolene suture incorporating the  polypropylene mesh. The outer edges of the polypropylene mesh then were  sutured circumferentially to the rectus fascia inferiorly and laterally  and to the xiphoid and costal margin superiorly, reinforcing the entire  area.  Meticulous hemostasis was achieved.  The wound was copiously  irrigated with warm  saline. The incision was closed in three layers with  a running #1 Vicryl suture in  the deep subcutaneous tissue, running 2-0 Vicryl sutures superficially,  and a 3-0 Vicryl subcuticular suture.  All sponge, needle and instrument  counts were correct at the end of the procedure.  The patient was taken  from the operating room to the post anesthesia care unit extubated and  in good condition.      Salvatore Decent Dorris Fetch, M.D.  Electronically Signed     SCH/MEDQ  D:  08/23/2006  T:  08/23/2006  Job:  [id number]

## 2010-06-23 NOTE — Progress Notes (Signed)
Summary: Triage  Medications Added DIFLUCAN 100 MG TABS (FLUCONAZOLE) 1 by mouth once daily for 10 days       Phone Note Call from Patient Call back at [facility] Phone [phone]   Caller: Patient Call For: [name] Summary of Call: Throat and Esophagus is full of "white sores" Initial call taken by: Karna Christmas,  February 17, 2010 8:07 AM  Follow-up for Phone Call        The pt saw [name] RNP in July.  She is having recurrent white sores on her throat and mouth. This started about 1 week ago.  She still had some of the Nystatin but it doesn't seem to be helping.  The pt's primary GI MD is Dr. [name]. Follow-up by: Joselyn Glassman,  February 17, 2010 9:52 AM  Additional Follow-up for Phone Call Additional follow up Details #1::        Spoke to [name] RNP, who was working at [facility] .  She said she hasn't seen her since July so she thinks I should send this to Dr Delia Chimes nurse.  Additional Follow-up by: Joselyn Glassman,  February 17, 2010 9:57 AM    Additional Follow-up for Phone Call Additional follow up Details #2::    please restart Diflucan 100mg , #10 1 by mouth once daily, 1 refill. Follow-up by: Hart Carwin MD,  February 17, 2010 12:36 PM  Additional Follow-up for Phone Call Additional follow up Details #3:: Details for Additional Follow-up Action Taken: Patient  aware of Dr. Regino Schultze recommendations Additional Follow-up by: Darcey Nora RN, CGRN,  February 17, 2010 2:02 PM  New/Updated Medications: DIFLUCAN 100 MG TABS (FLUCONAZOLE) 1 by mouth once daily for 10 days Prescriptions: DIFLUCAN 100 MG TABS (FLUCONAZOLE) 1 by mouth once daily for 10 days  #10 x 1   Entered by:   Darcey Nora RN, CGRN   Authorized by:   Hart Carwin MD   Signed by:   Darcey Nora RN, CGRN on 02/17/2010   Method used:   [facility]  [address] [phone]       [address]       [place], [place]  [address]  Ph: [phone]       Fax: [phone]   RxID:   [id number]

## 2010-06-23 NOTE — Miscellaneous (Signed)
Summary: rr med  Clinical Lists Changes  Observations: Added new observation of ALLERGY REV: Done (11/06/2009 15:40)

## 2010-06-23 NOTE — Assessment & Plan Note (Signed)
Summary: non cardiac chest pain.[name]    History of Present Illness Visit Type: consult  Primary GI MD: [name] MD Primary Provider: Larina Earthly, MD  Requesting Provider: [name], MD Chief Complaint: Chest pain non-cardiac, GERD, and dysphagia History of Present Illness:   This is a 60 year old African American female with a complicated GI and cardiac history. She has  recurrent dysphagia and odynophagia as well chest pain. A hospitalization on 5/12/11ruled out cardiac cause. She had a history of cardiac arrest and an MI in January 2011. She had a repair of an incisional hernia in April 2008 and was hospitalized for pseudomembranous colitis in the past. She is a former patient of Dr Victorino [place], who was evaluated for esophageal reflux.in the past. A barium esophagram in September 2003 showed abnormal peristalsis, 13 mm tablet passed without delay. A repeat upper endoscopy in 2007 required dilatation of the stricture. Her last endoscopy in October 2008 was negative and a CT scan of the abdomen in May 2005 showed no significant lesion. A repeat CT scan in November 2008 showed some diverticuli and degenerative joint disease of the lumbosacral spine. Her last colonoscopy in 2006 did not show any polyps.   GI Review of Systems    Reports abdominal pain, acid reflux, chest pain, dysphagia with liquids, dysphagia with solids, and  heartburn.     Location of  Abdominal pain: upper abdomen.    Denies belching, bloating, loss of appetite, nausea, vomiting, vomiting blood, weight loss, and  weight gain.        Denies anal fissure, black tarry stools, change in bowel habit, constipation, diarrhea, diverticulosis, fecal incontinence, heme positive stool, hemorrhoids, irritable bowel syndrome, jaundice, light color stool, liver problems, rectal bleeding, and  rectal pain.    Current Medications (verified): 1)  Nitrostat 0.4 Mg Subl (Nitroglycerin) .... As Needed 2)  Hydrocodone-Acetaminophen 5-325  Mg Tabs (Hydrocodone-Acetaminophen) .... As Needed For Pain 3)  Omeprazole 20 Mg Cpdr (Omeprazole) .... One Tablet By Mouth Two Times A Day 4)  Bayer Low Strength 81 Mg Tbec (Aspirin) .... One Tablet By Mouth Once Daily 5)  Effient 10 Mg Tabs (Prasugrel Hcl) .... One Tablet By Mouth Once Daily 6)  Lipitor 80 Mg Tabs (Atorvastatin Calcium) .... One Tablet By Mouth Once Daily 7)  Zetia 10 Mg Tabs (Ezetimibe) .... One Tablet By Mouth Once Daily 8)  Folbic 2.5-25-2 Mg Tabs (Fa-Pyridoxine-Cyancobalamin) .... One Tablet By Mouth Once Daily 9)  Coreg 3.125 Mg Tabs (Carvedilol) .... One Tablet By Mouth Two Times A Day 10)  Singulair 10 Mg Tabs (Montelukast Sodium) .... One Tablet By Mouth Once Daily 11)  Nasonex 50 Mcg/act Susp (Mometasone Furoate) .... As Directed 12)  Proventil Hfa 108 (90 Base) Mcg/act Aers (Albuterol Sulfate) .... As Directed  Allergies (verified): 1)  ! * Amiodarone 2)  ! * Multaq 3)  ! * Plavix  Past History:  Past Medical History: Reviewed history from 07/31/2007 and no changes required. 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 Patient is a former smoker.  Alcohol Use - no Illicit Drug Use - no  Review of Systems       The patient complains of allergy/sinus.  The patient denies anemia, anxiety-new, arthritis/joint pain, back pain, blood in urine, breast changes/lumps, change in vision, confusion, cough, coughing up blood, depression-new, fainting, fatigue, fever, headaches-new, hearing problems, heart murmur, heart rhythm changes, itching, menstrual pain, muscle pains/cramps, night sweats, nosebleeds, pregnancy symptoms, shortness of breath, skin rash, sleeping problems, sore throat, swelling of feet/legs, swollen lymph glands, thirst - excessive , urination - excessive , urination changes/pain, urine leakage, vision changes, and voice change.         Pertinent positive and negative review of systems were noted in the above HPI. All other ROS was otherwise negative.   Vital Signs:  Patient profile:   60 year old female Height:      66 inches Weight:      118 pounds BMI:     19.11 BSA:     1.60 Pulse rate:   68 / minute Pulse rhythm:   regular BP sitting:   124 / 60  (left arm) Cuff size:   regular  Vitals Entered By: Ok Anis CMA (November 03, 2009 10:21 AM)  Physical Exam  General:  Well developed, well nourished, no acute distress. Eyes:  PERRLA, no icterus. Mouth:  No deformity or lesions, dentition normal. Neck:  Supple; no masses or thyromegaly. Lungs:  Clear throughout to auscultation. Heart:  Regular rate and rhythm; no murmurs, rubs,  or bruits. Msk:  Symmetrical with no gross deformities. Normal posture. Extremities:  no edema.   Impression & Recommendations:  Problem # 1:  ESOPHAGEAL STRICTURE (ICD-530.3) Patient has recurrent solid food dysphagia and odynophagia. We need to rule out a recurrent esophageal stricture. We will switch her from Prilosec to samples of Nexium 40 mg twice a  day and add Carafate slurry 10 cc p.o. b.i.d. We will schedule the patient for a repeat upper endoscopy with dilatation as well.  Problem # 2:  MYOCARDIAL INFARCTION (ICD-410.90) Patient has chest pain although cardiac causes are ruled out. I believe her symptoms are chronically related to her esophagus.  Problem # 3:  POLYP, COLON (ICD-211.3) There was a polyp mentioned on colonoscopy in 2006 but no pathology was obtained. A history of prior polyps was also mention on a colonoscopy in 2001. Her first colonoscopy was done in 1995 and it was normal. She has no family history of colon cancer. I think her recall colonoscopy should be in 10 years which would be in August 2016.  Other Orders: EGD SAV (EGD SAV)  Patient Instructions: 1)  Nexium 40 mg p.o. b.i.d. take samples. 2)  Upper endoscopy with dilatation. 3)  Carafate slurry 10 cc p.o. b.i.d. 4)  Recall colonoscopy August 2016. 5)  Copy sent to : Dr Elvera Lennox.Avva 6)  The medication list was reviewed and reconciled.  All changed / newly prescribed medications were explained.  A complete medication list was provided to the patient / caregiver. Prescriptions: CARAFATE 1 GM/10ML SUSP (SUCRALFATE) Take 2 teaspoons by mouth two times a day  #8 ounces x 0   Entered by:   Lamona Curl CMA (AAMA)   Authorized by:   Hart Carwin MD   Signed by:   Lamona Curl CMA (AAMA) on 11/03/2009  Method used:   [facility]       [address]       [place], [place]  [address]       Ph: [phone]       Fax: [phone]   RxID:   [id number]

## 2010-06-23 NOTE — Letter (Signed)
Summary: EGD Instructions  [facility]  [address] [place], [place] [address]   Phone: [phone]  Fax: [phone]       MAISON AGRUSA    03/28/1951    MRN: [id number]       Procedure Day /Date: Thursday 11/06/09     Arrival Time: 2:00 pm     Procedure Time: 3:00 pm     Location of Procedure:                    _x  _ [facility] (4th Floor)  PREPARATION FOR ENDOSCOPY   On 11/06/09 THE DAY OF THE PROCEDURE:  1.   No solid foods, milk or milk products are allowed after midnight the night before your procedure.  2.   Do not drink anything colored red or purple.  Avoid juices with pulp.  No orange juice.  3.  You may drink clear liquids until 1:00 pm, which is 2 hours before your procedure.                                                                                                CLEAR LIQUIDS INCLUDE: Water Jello Ice Popsicles Tea (sugar ok, no milk/cream) Powdered fruit flavored drinks Coffee (sugar ok, no milk/cream) Gatorade Juice: apple, white grape, white cranberry  Lemonade Clear bullion, consomm, broth Carbonated beverages (any kind) Strained chicken noodle soup Hard Candy   MEDICATION INSTRUCTIONS  Unless otherwise instructed, you should take regular prescription medications with a small sip of water as early as possible the morning of your procedure.                   OTHER INSTRUCTIONS  You will need a responsible adult at least 58 years of age to accompany you and drive you home.   This person must remain in the waiting room during your procedure.  Wear loose fitting clothing that is easily removed.  Leave jewelry and other valuables at home.  However, you may wish to bring a book to read or an iPod/MP3 player to listen to music as you wait for your procedure to start.  Remove all body piercing jewelry and leave at home.  Total time from sign-in until discharge is approximately 2-3 hours.  You should  go home directly after your procedure and rest.  You can resume normal activities the day after your procedure.  The day of your procedure you should not:   Drive   Make legal decisions   Operate machinery   Drink alcohol   Return to work  You will receive specific instructions about eating, activities and medications before you leave.    The above instructions have been reviewed and explained to me by   Lamona Curl CMA Duncan Dull)  November 03, 2009 11:07 AM     I fully understand and can verbalize these instructions _____________________________ Date 11/03/09

## 2010-06-23 NOTE — Progress Notes (Signed)
Summary: Medication  Medications Added DIFLUCAN 100 MG TABS (FLUCONAZOLE) Take 1 tablet by mouth once a day x 5 days       Phone Note Call from Patient Call back at Home Phone [phone]   Caller: Patient Call For: Dr. [name] Reason for Call: Talk to Nurse Summary of Call: Diflucan rx [phone]  Initial call taken by: Karna Christmas,  November 07, 2009 8:13 AM  Follow-up for Phone Call        looks like prescription was never sent from Endo...Marland KitchenMarland KitchenI will [facility]. Follow-up by: Lamona Curl CMA Duncan Dull),  November 07, 2009 9:01 AM    New/Updated Medications: DIFLUCAN 100 MG TABS (FLUCONAZOLE) Take 1 tablet by mouth once a day x 5 days Prescriptions: DIFLUCAN 100 MG TABS (FLUCONAZOLE) Take 1 tablet by mouth once a day x 5 days  #5 x 0   Entered by:   Lamona Curl CMA (AAMA)   Authorized by:   Hart Carwin MD   Signed by:   Lamona Curl CMA (AAMA) on 11/07/2009   Method used:   [facility]  [address] [phone][facility] (retail)       [address]       [place], [place]  [address]       Ph: [phone]       Fax: [phone]   RxID:   [id number]

## 2010-06-23 NOTE — Progress Notes (Signed)
Summary: TRIAGE   Phone Note Call from Patient Call back at Home Phone [phone]   Caller: Patient Call For: Dr. [name] Reason for Call: Talk to Nurse Summary of Call: would like samaples of Nexium also reporting yeast infection in mouth and down throat also reportin that "bowels are stringy" Initial call taken by: Vallarie Mare,  December 03, 2009 12:19 PM  Follow-up for Phone Call        Message left for patient to callback. Laureen Ochs LPN  December 03, 2009 2:16 PM  Message left for patient to callback. Laureen Ochs LPN  December 03, 2009 4:37 PM   Additional Follow-up for Phone Call Additional follow up Details #1::        1)Was given Diflucan for 5 days, after Candida was found on Endoscopy 11-06-09.  Pt. states her yeast returned 2 weeks ago, her PCP gave her mouthwash, but pt. states the yeast keeps comming back.   2) Pt. c/o 'stringy" dark and foul smelling stools for 3 days. Also abd. tenderness.  Pt. would like to be seen soon. She will see Willette Cluster NP this morning at 9:30am. Additional Follow-up by: Laureen Ochs LPN,  December 04, 2009 8:50 AM

## 2010-06-23 NOTE — Discharge Summary (Signed)
Summary: Chest Pain    NAME:  [name], [name]               ACCOUNT NO.:  [id number]      MEDICAL RECORD NO.:  [id number]          PATIENT TYPE:  INP      LOCATION:  2807                         FACILITY:  [facility]      PHYSICIAN:  [name], M.D.   DATE OF BIRTH:  11-May-1951      DATE OF ADMISSION:  10/02/2009   DATE OF DISCHARGE:  10/03/2009                                  DISCHARGE SUMMARY      DISCHARGE DIAGNOSES:   1. Chest pain, worrisome for unstable angina, myocardial infarction       ruled out this admission, catheterization done by Dr. Lynnea Ferrier       showing no significant progression of coronary artery disease,       please see his cath note for complete details.   2. Known coronary artery disease with coronary artery bypass grafting       in 1995, out-of-hospital arrest and myocardial infarction in       January 2011 with catheterization at that time showing an occluded       marginal that is being treated medically.   3. Mild left ventricular dysfunction with an ejection fraction of 50%       as an outpatient.   4. Chronic obstructive pulmonary disease with prior history of       smoking.   5. Peripheral vascular disease with prior left subclavian artery       stenting that was patent when the patient was cath-ed in January       2011.   6. Treated hypertension.   7. Chronic back pain followed by Dr. Jordan Likes.   8. Treated dyslipidemia.      HOSPITAL COURSE:  Ms. [name] is a well-known 60 year old female with   coronary artery disease as described above.  She has had multiple   admissions for chest pain.  In January of this year, she presented with   an out-of-hospital VF arrest and was shocked 3 times.  Catheterization   then revealed an occluded marginal that Dr. [name] felt was the culprit   lesion and she has been treated medically.  She was evaluated by the EP   Service at that time and it was not felt ultimately that she needed a   defibrillator or amiodarone.   She presented to the emergency room Oct 02, 2009, with severe 9/10 chest pain while walking into the school.   She took two nitroglycerin without relief.  She came to the emergency   room and continued to have pain despite 40 mcg of nitroglycerin.  Her   enzymes were negative and her EKG was normal.  Morphine eventually eased   her symptoms.  She was admitted and her enzymes have been negative.  She   saw Dr. Clarene Duke in the morning of the 13th and was not having chest pain.   We suggested she ambulate to see if her symptoms recurred, if so we will   plan on catheterization.  Later in the afternoon of the  13th, the   patient felt that she wanted to proceed with catheterization since she   felt like her symptoms on admission were similar to her pre-MI symptoms.   She was taken to Cath Lab by Dr. Lynnea Ferrier who reportedly saw no   significant progression of coronary artery disease from her June 03, 2009, cath.  He feels she can be discharged later on the 13th.  Please   see med rec for complete details of her discharge medications.      LABORATORY DATA:  White count 5.9, hemoglobin 12, hematocrit 34.1, and   platelets 194.  CK-MB and troponins are negative x4.  Urinalysis is   unremarkable.  TSH is 0.59.  Sodium 142, potassium 3.5, BUN 4, and   creatinine 0.54.  Liver functions are normal.  EKG, sinus rhythm without   acute changes.  Chest x-ray shows COPD and emphysema.  D-dimer on   admission was less than 0.22.  INR was 1.15.      DISPOSITION:  The patient is discharged in stable condition and will   follow up with Dr. [name] in a couple of weeks.               [name], P.A.         ______________________________   [name], M.D.         Lenard Lance  D:  10/03/2009  T:  10/04/2009  Job:  [id number]      Electronically Signed by Corine Shelter P.A. on 10/06/2009 04:51:01 PM   Electronically Signed by [name] M.D. on 10/07/2009 04:54:09 PM

## 2010-06-23 NOTE — Discharge Summary (Signed)
Summary: Chest Pain  NAME:  [name], [name]               ACCOUNT NO.:  [id number]   MEDICAL RECORD NO.:  [id number]          PATIENT TYPE:  INP   LOCATION:  6526                         FACILITY:  [facility]   PHYSICIAN:  [name], M.D.   DATE OF BIRTH:  [birth year]   DATE OF ADMISSION:  07/13/2005  DATE OF DISCHARGE:  07/15/2005                                 DISCHARGE SUMMARY   [name] is a 60 year old female patient of Dr. [name] who presented to the  hospital with complaints of chest pain. She has a history of coronary artery  disease and in the past underwent coronary artery bypass grafting in 1995.  This time the pain continued through the night and the patient was unable to  sleep. Also had shortness of breath and had to take three nitroglycerins  with partial relief. Eventually she was brought to the emergency room.   She was admitted on rule out MI protocol and underwent coronary angiography.   HOSPITAL PROCEDURE:  Cardiac catheterization performed on July 14, 2005,  by Dr. [name]. It revealed patent grafts, but the patient does have severe  native vessel disease with ___________ grafts and previously stented areas  of the native RCA are patent.   HOSPITAL CONSULTATION:  [facility] saw the patient on July 14, 2005. Dr.  Leone Payor did feel like at this time that GI workup would be necessary. They  suggest that the patient continue PPI and to see how things are going and if  she does not improve then they will see her an outpatient.   We cycled her enzymes and they were negative. Laboratories were as follows:  Hemoglobin was 12.2, hematocrit 34.7, white blood cell count 5.9, platelet  count 166,000. Sodium 142, potassium 3.7,  BUN 6, creatinine 0.7.   Her lipid profile revealed a total cholesterol of 304, triglycerides 180,  HDL 46, LDL 222.   Dr. [name] saw the patient on the day of discharge and considered her to be  stable for discharge home.   DISCHARGE  MEDICATIONS:  1.  Norvasc 5 mg daily.  2.  Lipitor 80 mg daily.  3.  Aspirin 81 mg daily.  4.  Nexium 40 mg daily.  5.  Foltx daily.  6.  Singulair 10 mg daily.  7.  Albuterol p.r.n. as before.  8.  Nasonex p.r.n. as before.   DISCHARGE DIET:  Low fat, low cholesterol diet.   DISCHARGE ACTIVITY:  No driving, no lifting greater than 5 pounds for three  days; no strenuous activity. The patient was instructed to avoid spicy fried  food.   DISCHARGE FOLLOWUP:  Dr. [name] will see her on March 14th at 2:30 p.m.      Raymon Mutton, P.A.      [name], M.D.  Electronically Signed    MK/MEDQ  D:  07/15/2005  T:  07/15/2005  Job:  [id number]   cc:   [facility]

## 2010-06-23 NOTE — Letter (Signed)
Summary: [facility]  [facility]   Imported By: Sherian Rein 10/30/2009 11:52:40  _____________________________________________________________________  External Attachment:    Type:   Image     Comment:   External Document

## 2010-06-23 NOTE — Consult Note (Signed)
Summary: Chest Pain   NAME:  [name], [name]               ACCOUNT NO.:  [id number]   MEDICAL RECORD NO.:  [id number]          PATIENT TYPE:  INP   LOCATION:  6526                         FACILITY:  [facility]   PHYSICIAN:  Iva Boop, M.D. LHCDATE OF BIRTH:  [birth year]   DATE OF CONSULTATION:  07/14/2005  DATE OF DISCHARGE:  07/15/2005                                   CONSULTATION   REQUESTING PHYSICIAN:  Dr. [name].   REASON FOR CONSULTATION:  Chest pain, ? gastrointestinal causes.   ASSESSMENT:  Chest pain.  Cardiac catheterization has shown patent grafts  and she has ruled out for myocardial infarction.  She had about a day and a  half of chest pain.  She is on Prilosec twice daily, so I do not think  esophageal reflux is the cause, as she had good control of heartburn and has  not had repeated episodes of chest pain and given the duration.  The  possibility of esophageal spasm may exist, though again I would not think  that it would last for a day and a half.  In addition, panic attack is  possible.  She had some fatigue as well.   RECOMMENDATIONS AND PLAN:  I would observe her and continue her current  medications.  It has not happened frequently enough to initiate any therapy  for panic attacks or esophageal spasm, as best I can tell.  She had an EGD  in 2002 by Dr. Victorino Dike that demonstrated what he thought was some  esophageal spasm.  She had a hiatal hernia.  He dilated her esophagus.  She  does not have dysphagia now.  She had a barium swallow in 2003 that was  normal.  She has also had an ultrasound of the abdomen and a HIDA scan with  ejection fraction in 2005 for right upper quadrant that were okay.  There  was some question of renal abnormality, but this was not confirmed on CT  scanning in 2005.   HISTORY:  [age]-year-old Native American woman that was admitted with  severe substernal chest pressure like an elephant on her chest that went on  for  about a day and a half and was not relieved by nitroglycerin.  She has  known coronary artery disease and had a coronary artery bypass grafting  before and had a cardiac catheterization today which showed patent grafts.  There was a question of noncardiac chest pain.  She denies any dysphagia,  weight loss or anorexia.  For about a day and a half, she was very tired and  had this severe pressure-like pain.  She was a little sweaty, a little  nauseated and a little tachycardic.  She does not sleep well, but that has  always been that way.  She denies any overt anxiety, though she says I've  had a lot on me.  Her husband had been very ill in the summer, but is  recovering.  They have a 2-year-old child.  She did not elaborate, though  she did go on to say she did not want  any nerve pills.   DRUG ALLERGIES:  PLAVIX causes hives.  METHYLPREDNISOLONE causes swelling.   MEDICATIONS ON ADMISSION:  1.  Prilosec 20 mg twice daily.  2.  Singulair 10 mg daily.  3.  Lipitor 80 mg daily.  4.  Foltx daily.  5.  Aspirin 81 mg twice a day.  6.  Albuterol two puffs 4 times a day.  7.  Nasonex p.r.n.  8.  Nitroglycerin p.r.n.   PAST MEDICAL HISTORY:  1.  Coronary artery disease, as above.  2.  Peripheral vascular disease.  3.  Reflux disease and hiatal hernia as mentioned.  4.  Hypertension.  5.  Dyslipidemia.  6.  Anxiety.  7.  Colonoscopy, August '06 showing mixed hemorrhoids (small), 4-mm polypoid      lesion removed, but it was normal tissue.  8.  History of bleeding peptic ulcer disease.  9.  Lumbar laminectomy with chronic back pain.  10. Prior carpal tunnel release.  11. Hysterectomy without oophorectomy in 1998.  12. 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   SOCIAL HISTORY:  Former smoker, married, lives in [place], social  history as otherwise noted.   FAMILY HISTORY:  Family history of lung disease, diabetes, breast cancer.   REVIEW OF SYSTEMS:  She has had some increased  dyspnea on exertion.  She has  had some urinary frequency.  She has been a little weak, as noted.  She has  nocturia.  All other systems appear negative, other than those things  mentioned above.   PHYSICAL EXAMINATION:  GENERAL:  Physical exam reveals a pleasant middle-  aged Native American woman in no acute distress.  VITAL SIGNS:  Temperature 98.2, blood pressure 106/52, pulse 54,  respirations 20.  HEENT:  Eyes anicteric.  Mouth free of lesions.  NECK:  Supple.  CHEST:  Clear.  Nontender chest wall.  HEART:  S1 and S2.  No rubs or gallops.  ABDOMEN:  Soft and nontender without organomegaly or mass.  EXTREMITIES:  There is a pressure dressing in the right groin.  Extremities  are without edema.  SKIN:  No acute rash.  NEUROLOGIC:  She is alert and oriented x3.  Affect is slightly flat I think.   LABORATORY DATA:  Hemoglobin 12, hematocrit 34, white count 5.9, platelets  256,000.  Coagulations normal.  CMET normal except for albumin 3.6, total  protein 5.6, cholesterol 304, triglycerides 180.  Troponins and CK-MBs  negative x2.   EKG:  Normal except what looks like perhaps slight low voltage.  She had  sinus bradycardia on one.   ASSESSMENT:  Additional thoughts would be perhaps pericarditis, though I  suspect that would be seen at catheterization or evident on x-ray, though  she had what I think are some low voltages in her limb leads, but not so  much in the precordial leads.  Other consideration would be microvascular  angina, though that seems unlike.   Also another consideration would be a respiratory process, given her asthma,  and so forth, though it is atypical for that.   I appreciate the opportunity to care for this patient.      Iva Boop, M.D. [facility]  Electronically Signed     CEG/MEDQ  D:  07/14/2005  T:  07/15/2005  Job:  [id number]   cc:   [name], M.D.  Fax: [phone]   Larina Earthly, M.D.  Fax: [phone]

## 2010-06-23 NOTE — Progress Notes (Signed)
Summary: update   Phone Note Call from Patient Call back at Home Phone [phone]   Caller: Patient Call For: Dr. [name] Reason for Call: Talk to Nurse Summary of Call: yeast infection not improving per pt Initial call taken by: Vallarie Mare,  May 01, 2010 11:00 AM  Follow-up for Phone Call        No answer at home number. Jesse Fall RN  May 01, 2010 11:12 AM Spoke with patient and explained to her that the yeast infection is coming from all the inhalers and steriods she is on. Instructed patient to call the MD that has prescribed all of these and have them treat her. Follow-up by: Jesse Fall RN,  May 01, 2010 11:50 AM

## 2010-06-23 NOTE — Procedures (Signed)
Summary: CATH    NAMEMAELIN, KURKOWSKI               ACCOUNT NO.:  [id number]      MEDICAL RECORD NO.:  [id number]          PATIENT TYPE:  INP      LOCATION:  2807                         FACILITY:  [facility]      PHYSICIAN:  Ritta Slot, MD     DATE OF BIRTH:  [birth year]      DATE OF PROCEDURE:  10/03/2009   DATE OF DISCHARGE:                               CARDIAC CATHETERIZATION      PATIENT PROFILE:  Ms. [name] is a very pleasant 60 year old   African American female who underwent coronary artery bypass grafting in   1995 as well as history of left subclavian artery stenting in the past.   She comes in now with complaining of chest pain.  Troponin is negative   as well as ECG.  She is brought to the [facility] Cardiac   Catheter[facility] for further evaluation and possible   progression of coronary artery disease.      After informed consent, she was placed in supine position in the Second   Floor [facility] Cardiac Catheter[facility] Laboratory.  The right groin   was prepped and draped in the usual sterile fashion.  She received 2 mg   of Versed, 50 mcg of fentanyl for light sedation.  Lidocaine 10 cc of 1%   was infiltrated into the right groin.  The RFA was accessed using the   Seldinger technique with a 5-French sheath.  After each access, the RFA   was performed without complications.  A JR-4 catheter was passed into   the aorta.  Visualization of the RCA, SVG to RCA, SVG to D1 and OM1 and   limit the LAD was used for the JL-4 catheter.  The JL-4 catheter was   then exchanged over an exchange wire for the JL-4 catheter.  So, the JL-   4 catheter was then engaged in the left main coronary artery.  The   visualization of the left coronary circulation was then obtained.      FINDINGS:   1. AO pressure 103/51/71.  LV pressure 113/10 with a mean of 12, LVP       130/4 (19), AOP 124/60 (86).   2. Coronaries.  The right coronary artery had a 95% occlusion   proximally with a stent in its midportion.  It was occluded and the       distal proximal stent 100%.  The LAD had a 99% occlusion at the       proximal portion, but did transmit all the way down to the apical       portion of the LAD.  There was retrograde competitive flow with the       lead insertion to the LAD.  There is a 100% occlusion in the       proximal side.      SVG to RCA is found to be patent, free of any disease and inserting into   bifurcation the PLA and PDA.  There is no significant disease in the SVG  to RCA.  The SVG to the sequential D1 and OM1 was found to be patent,   free of any disease and its subsequent territories with the diagonal OM1   territory was also free of any disease.  The LIMA to the LAD was found   to be patent and with a good insertion to the LAD and free of any   disease.  Left subclavian stent was also found to be patent without any   evidence of obstruction.      LV function was performed with 36 cm contrast at 12 mL per second.   There was no significant wall motion abnormalities with normal EF of   65%.      FINAL CONCLUSIONS:  Patent LIMA to the LAD, patient SVG to the D1 and   OM1, patent SVGs to RCA, patent left subclavian artery stent, 100%   native RCA, 100% circumflex, 100% distal LAD occlusion.  Normal LV   function.      PLAN:  Continue her medical therapy.  There is no obvious cardiac chest   pain.  She will continue on with the current medical therapy.               Ritta Slot, MD            HS/MEDQ  D:  10/03/2009  T:  10/04/2009  Job:  [id number]      Electronically Signed by Ritta Slot MD on 10/14/2009 12:19:49 PM

## 2010-06-23 NOTE — Discharge Summary (Signed)
Summary: CHEST PAIN-NON CARDIAC   NAME:  [name], [name]               ACCOUNT NO.:  [id number]   MEDICAL RECORD NO.:  [id number]          PATIENT TYPE:  INP   LOCATION:  2009                         FACILITY:  [facility]   PHYSICIAN:  [name], M.D.   DATE OF BIRTH:  18-Aug-1950   DATE OF ADMISSION:  07/22/2006  DATE OF DISCHARGE:  07/24/2006                               DISCHARGE SUMMARY   DISCHARGE DIAGNOSES:  1. Chest pain, myocardial infarction ruled out.  2. Palpitations.  3. Known coronary disease with a history of coronary artery bypass      grafting in the past with multiple catheterizations since, last one      being February of 2007 revealing patent grafts and normal left      ventricular function.  4. Anxiety.  5. Treated hypotension.  6. Gastroesophageal reflux disease.  7. Treated dyslipidemia.   HOSPITAL COURSE:  The patient is a 60 year old female followed by Dr.  [name] with a history of coronary disease.  She had bypass surgery in the  past and has had multiple caths and interventions since, her last  catheterization was in February of 2007.  She had patent grafts and she  is being treated medically.  She presented to the emergency room on  July 22, 2006 with chest pain worrisome for unstable angina.  She  was admitted to telemetry, started on IV heparin, CK/MB and troponin  were negative.  Her heparin was stopped the next day.  She was a little  hypotensive and we held her Norvasc.  She admit to some PVCs and  palpitations prior to having chest pain.  We added low dose beta  blocker.  The patient was seen by Dr. Tresa Endo on July 24, 2006.  She  related to Dr. Tresa Endo after a long discussion that she has a lot of  stress at home, apparently her children have had problems with drug  abuse and she is caring for a grandson of one of her children.  Dr.  Tresa Endo started her on Lexapro.  We feel that she can be followed up as an  outpatient.  At discharge, it was noted  that the patient was taking  Nexium, Zegerid, Prilosec and omeprazole.  I asked her to contact Dr.  Richarda Osmond office about this and clarify the medicine of choice.  Other medicines at discharge, Lipitor 80 mg daily, Zetia 10 mg daily,  aspirin 81 mg daily, Singulair 10 mg daily, Spiriva once daily,  omeprazole 20 mg daily, Lexapro 10 mg daily, Coreg 3.125 mg daily,  Foltex once daily.  She will stop her Norvasc.   LABORATORY DATA:  White count 7.5, hemoglobin 12.2, hematocrit 35.1,  platelets 238.  Sodium 141, potassium 3.4, BUN 3, creatinine 0.56.  LDL  is 102, cholesterol is 172, CK/MB and troponin are negative times three.  Chest x-ray shows no acute findings.  INR is 1.2.   DISPOSITION:  The patient is discharged in stable condition.  She will  follow up with Dr. [name].  She has been instructed to contact Dr. Richarda Osmond office  as well.   DICTATED BY:  Abelino Derrick, P.A.      [name], M.D.  Electronically Signed     JB/MEDQ  D:  07/24/2006  T:  07/24/2006  Job:  [id number]   cc:   Ulyess Mort, MD

## 2010-06-23 NOTE — Procedures (Signed)
Summary: Upper Endoscopy w/DIL  Patient: [name] Note: All result statuses are Final unless otherwise noted.  Tests: (1) Upper Endoscopy w/DIL (UED)  UED Upper Endoscopy w/DIL                             DONE     [facility]     [address]     [place], [place]  [address]           ENDOSCOPY PROCEDURE REPORT           PATIENT:  Whitney, [name]  MR#:  [id number]     BIRTHDATE:  [birth year], 58 yrs. old  GENDER:  female           ENDOSCOPIST:  Hedwig Morton. [name], MD     ASSISTANT:           PROCEDURE DATE:  11/06/2009     PROCEDURE:  EGD with biopsy     ASA CLASS:  Class III     INDICATIONS:  1) chest pain  2) dysphagia EGD 2007 with dilation           MEDICATIONS:   Versed 5 mg, Fentanyl 50 mcg     TOPICAL ANESTHETIC:  Exactacain Spray           DESCRIPTION OF PROCEDURE:   After the risks benefits and     alternatives of the procedure were thoroughly explained, informed     consent was obtained.  The [id number] endoscope was     introduced through the mouth and advanced to the second portion of     the duodenum, without limitations.  The instrument was slowly     withdrawn as the mucosa was carefully examined.     <<PROCEDUREIMAGES>>           Esophagitis was found in the total esophagus. candida esophagitis,     heavy exudate predominantly in proximal stomach With standard     forceps, a biopsy was obtained and sent to pathology (see image1,     image2, image7, and image8).  The examination was otherwise normal     (see image3, image4, image5, and image6).    Dilation was then     performed at the           COMPLICATIONS:  None           ENDOSCOPIC IMPRESSION:     1) Esophagitis in the total esophagus     2) Otherwise normal examination.     Candida esophagitis, no stricture     RECOMMENDATIONS:     Diflucan 100mg , # 5 1 po qd,           REPEAT EXAM:  In 0 year(s) for.           ______________________________     Hedwig Morton. [name], MD         CC:  Chilton Greathouse, M.D.           n.     eSIGNED:   Hedwig Morton. Rakwon Letourneau at 11/06/2009 03:35 PM           Nicholaus Corolla, [id number]  Note: An exclamation mark (!) indicates a result that was not dispersed into the flowsheet. Document Creation Date: 11/06/2009 3:36 PM _______________________________________________________________________  (1) Order result status: Final Collection or observation date-time: 11/06/2009 15:19 Requested date-time:  Receipt date-time:  Reported date-time:  Referring Physician:   Ordering Physician: Verlee Monte  [name] [phone]) Specimen Source:  Source: Launa Grill Order Number: [id number] Lab site:

## 2010-06-23 NOTE — Discharge Summary (Signed)
Summary: Coronary Artery Disease    NAME:  [name], [name]               ACCOUNT NO.:  [id number]      MEDICAL RECORD NO.:  [id number]          PATIENT TYPE:  INP      LOCATION:  3706                         FACILITY:  [facility]      PHYSICIAN:  Nicki Guadalajara, M.D.     DATE OF BIRTH:  [birth year]      DATE OF ADMISSION:  07/25/2009   DATE OF DISCHARGE:  07/29/2009                                  DISCHARGE SUMMARY      DISCHARGE DIAGNOSES:   1. Coronary artery disease with recent ventricular fibrillation arrest       in January 2011.   2. Paroxysmal atrial fibrillation with maintaining sinus rhythm now.   3. Ejection fraction of 40% by catheterization in January 2011.   4. Positive tobacco abuse.   5. Chronic obstructive pulmonary disease.   6. Dyslipidemia.   7. Gastroesophageal reflux disease.   8. Chronic back pain.   9. Symptomatic bradycardia secondary to beta-blocker therapy.      DISCHARGE MEDICATIONS:   1. Amiodarone 200 mg p.o. daily.   2. Effient 10 mg p.o. daily.   3. Zetia 10 mg p.o. nightly.4. Lipitor 80 mg p.o. nightly.   5. Spiriva 18 mcg 1 inhalation daily.   6. Omeprazole 20 mg p.o. daily.   7. Albuterol inhaler b.i.d. p.r.n.   8. Nasonex nasal spray 2 sprays each nostril daily.   9. Singulair 10 mg p.o. nightly.   10.Aspirin 81 mg p.o. daily.   11.Nitroglycerin sublingual 0.4 mg q.5 minutes p.r.n. x3.   12.Vicodin q.4 h. p.r.n.   13.Folic acid over-the-counter 1 tablet daily.   14.Please note the discontinuation of all beta-blockers upon discharge       home.      BRIEF HOSPITAL COURSE:  [name] is a pleasant 60 year old female   patient of Dr. Hazle Coca who is well known to him.  She was recently   admitted following a v fib arrest in January 2011.  She was also noted   to have paroxysmal AFib and was started on amiodarone at that time.  She   has a history of COPD and continues tobacco abuse.  She is allergic to   PLAVIX and is unable to take secondary to  hives.  She also has   intolerance to MULTAQ.  She stated that she started having chest pain on   the morning of admission of July 25, 2009.  She stated that it felt like   an elephant was sitting on her chest.  Nitroglycerin sublingual did not   relieve this pain and it was associated with diaphoresis, dyspnea, and   nausea.  She was unable to receive nitroglycerin IV because of   borderline hypotension in the emergency department.  She was admitted   and started on IV heparin.  Her EKG showed no acute changes and her   cardiac enzymes were negative x3.  She continued to have some occasional   chest pain despite morphine, GI cocktail, and Vicodin.  She was able to  sleep well.  Over the next couple of days, her chest pain dissipated.   She did experience some symptomatic bradycardia beginning on July 26, 2009.  She complained of general weakness and fatigue.  Dr. [name]   thought that this was secondary to her beta-blocker therapy and beta-   blockers were then held from that point on.  The patient continued to   improve over the next 24 hours and then on the morning of July 27, 2009,   Dr. [name] thought that one more day in the hospital for beta-blocker   washout would suffice before discharge home.  The patient ambulated in   the hallway without chest pain or shortness of breath.  The patient was   seen and examined on the morning of July 29, 2009, by Dr. Nicki Guadalajara   who feels that she is stable for discharge home.  She did continue to   have some bradycardia throughout the night without chest pain or   shortness of breath.  The patient will be discharged home and placed on   a cardiac event monitor as an outpatient.  She will follow up with Dr.   [name] in several weeks.  She already has test scheduled for   echocardiogram and stress test following her v fib arrest from January   of this year.  The patient will have these tests performed and then see   Dr. [name] on August 22, 2009.        LABORATORY STUDIES ON THIS ADMISSION:  Chemistries at discharge shows a   sodium of 139, potassium 3.9, chloride 101, CO2 of 31, glucose 104, BUN   7, and creatinine 0.68.  CBC shows white blood cell count of 6.3,   hemoglobin 14.0, hematocrit 40.3, and platelets 197.  Cardiac enzymes   were negative.  Lipid profile shows a total cholesterol of 130,   triglycerides 37, HDL 51, and LDL of 72.  Hemoglobin A1c of 5.5.  TSH of   1.398.  D-dimer of less than 0.22.      DISCHARGE CONDITION:  Stable.      DISCHARGE INSTRUCTIONS:   1. Increase activity slowly.   2. Eat a low-sodium, heart-healthy diet.   3. May shower and bathe.   4. Please follow up with [facility] for the       scheduled outpatient tests this month.  Please follow up with       [facility] on August 06, 2009, at 9 a.m. for a       continuous cardiac event monitor to be placed.  Please follow up       with Dr. [name] in April 2011 to review the results of these tests.      FOLLOWUP APPOINTMENTS:   1. On August 06, 2009, at 9 a.m. for cardiac event monitor.   2. Dr. [name] on Friday, August 22, 2009, at 9:45 for followup testing       and hospitalization.               Sudie Grumbling, FNP-C         ______________________________   Nicki Guadalajara, M.D.         TY/MEDQ  D:  07/29/2009  T:  07/30/2009  Job:  [id number]      Electronically Signed by Sudie Grumbling  on 08/07/2009 07:35:40 AM   Electronically Signed by Nicki Guadalajara M.D. on 09/01/2009 05:30:22 PM

## 2010-06-23 NOTE — Letter (Signed)
Summary: [facility]   Imported By: Sherian Rein 10/30/2009 11:51:41  _____________________________________________________________________  External Attachment:    Type:   Image     Comment:   External Document

## 2010-06-25 NOTE — Assessment & Plan Note (Signed)
Summary: Pulmonary/ f/u copd with hfa @ 75% p coaching   Copy to:  Dr. Beaulah Dinning Primary Provider/Referring Provider:  Larina Earthly, MD   CC:  Follow-up w/ PFT's...pt c/o cough w/ yellowish sputum but says it is better...no change in her breathing.  History of Present Illness: 13 yowf quit smoking January 2011 when admit for heart problems  by Dr [name]   March 25, 2010 cc doe overall better than at discharge from [facility] but can't get from one room to next despite using spiriva daily and proventil hfa twice daily. wakes up every day with very congested cough> mucoid thick sputum and can't get clear until uses am proventil but not typically waking up at night with resp issues.   rec symbicort 160 2 puffs first thing  in am and 2 puffs again in pm about 12 hours later Change nexium to Take one 30-60 min before first and last meals of the day  mucinex as needed for cough, take per bottle > can't really   GERD diet   April 29, 2010 ov Dyspnea-somewhat improved, overall satisfied with less sob, less cough and no need for saba.  Work on inhaler technique:  relax and blow all the way out then take a nice smooth deep breath back in, triggering the inhaler at same time you start breathing in and hold for a few seconds only proventil if needed stop qvar Prednisone 4 each am x 2days, 2x2days, 1x2days and stop   June 15, 2010  ov cc  cough w/ yellowish sputum but says it is better...no change in her breathing overall. Pt denies any significant sore throat, dysphagia, itching, sneezing,  nasal congestion or excess secretions,  fever, chills, sweats, unintended wt loss, pleuritic or exertional cp, hempoptysis, change in activity tolerance  orthopnea pnd or leg swelling Pt also denies any obvious fluctuation in symptoms with weather or environmental change or other alleviating or aggravating factors.       Preventive Screening-Counseling & Management  Alcohol-Tobacco     Smoking Status:  quit  Current Medications (verified): 1)  Bayer Low Strength 81 Mg Tbec (Aspirin) .... One Tablet By Mouth Once Daily 2)  Effient 10 Mg Tabs (Prasugrel Hcl) .... One Tablet By Mouth Once Daily 3)  Lipitor 80 Mg Tabs (Atorvastatin Calcium) .... One Tablet By Mouth Once Daily 4)  Zetia 10 Mg Tabs (Ezetimibe) .... One Tablet By Mouth Once Daily 5)  Singulair 10 Mg Tabs (Montelukast Sodium) .... One Tablet By Mouth Once Daily 6)  Spiriva Handihaler 18 Mcg Caps (Tiotropium Bromide Monohydrate) .Marland Kitchen.. 1 Puff Daily 7)  Omeprazole 20 Mg Cpdr (Omeprazole) .Marland Kitchen.. 1 By Mouth Two Times A Day 8)  Symbicort 160-4.5 Mcg/act  Aero (Budesonide-Formoterol Fumarate) .... 2 Puffs First Thing  in Am and 2 Puffs Again in Pm About 12 Hours Later 9)  Proventil Hfa 108 (90 Base) Mcg/act Aers (Albuterol Sulfate) .... 2 Puffs Up To Every 4 Hours 10)  Nitrostat 0.4 Mg Subl (Nitroglycerin) .... As Needed 11)  Hydrocodone-Acetaminophen 5-325 Mg Tabs (Hydrocodone-Acetaminophen) .... As Needed For Pain 12)  Mucinex Dm 30-600 Mg Xr12h-Tab (Dextromethorphan-Guaifenesin) .... Per Bottle As Needed 13)  Fluticasone Propionate 50 Mcg/act Susp (Fluticasone Propionate) .Marland Kitchen.. 1-2 Sprays Two Times A Day As Needed 14)  Diflucan 100 Mg Tabs (Fluconazole) .Marland Kitchen.. 1 By Mouth Once Daily X 5 Days  Allergies (verified): 1)  ! * Amiodarone 2)  ! * Multaq 3)  ! 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) HEMORRHOIDS (ICD-455.6) POLYP, COLON (ICD-211.3) ESOPHAGEAL STRICTURE (ICD-530.3) HIATAL HERNIA (ICD-553.3) GASTRITIS, CHRONIC (ICD-535.10) CAD (ICD-414.00) PERIPHERAL NEUROPATHY (ICD-356.9) HYPERLIPIDEMIA (ICD-272.4) PEPTIC ULCER DISEASE (ICD-533.90) ATHEROSCLEROTIC CARDIOVASCULAR DISEASE (ICD-429.2) DEPRESSION (ICD-311) ANXIETY (ICD-300.00) COPD..................................................................Marland KitchenWert     - PFt's 06/15/10 FEV1  1.54 (61%) 55  ratio no better p B2 and DLC0 62%     - HFA  50% p coaching April 29, 2010 > 75% June 15, 2010  GERD (ICD-530.81)  Vital Signs:  Patient profile:   60 year old female Height:      66 inches (167.64 cm) Weight:      117 pounds (53.18 kg) BMI:     18.95 O2 Sat:      96 % on Room air Temp:     98.3 degrees F (36.83 degrees C) oral Pulse rate:   63 / minute BP sitting:   118 / 78  (left arm) Cuff size:   regular  O2 Sat at Rest %:  96 O2 Flow:  Room air CC: Follow-up w/ PFT's...pt c/o cough w/ yellowish sputum but says it is better...no change in her breathing Comments Medications reviewed with patient Michel Bickers [facility]  June 15, 2010 11:34 AM   Physical Exam  Additional Exam:  thin amb wf with peculiar affect and minimal congested sounding cough wt 115 March 25, 2010 > 115 December 7 , 2011 > 117 June 15, 2010  HEENT mild turbinate edema.  Oropharynx no thrush or excess pnd or cobblestoning.  No JVD or cervical adenopathy. Mild accessory muscle hypertrophy. Trachea midline, nl thryroid. Chest was hyperinflated by percussion with diminished breath sounds and moderate increased exp time without wheeze. Hoover sign positive at mid inspiration. Regular rate and rhythm without murmur gallop or rub or increase P2 or edema.  Abd: no hsm, nl excursion. Ext warm without cyanosis or clubbing.     Impression & Recommendations:  Problem # 1:  COPD UNSPECIFIED (ICD-496) GOLD II and relatively well compensated on present rx   I spent extra time with the patient today explaining optimal mdi  technique.  This improved from  50-75% with coaching   Each maintenance medication was reviewed in detail including most importantly the difference between maintenance and as needed and under what circumstances the prns are to be used. See instructions for specific recommendations   Medications Added to Medication List This Visit: 1)  Omeprazole 20 Mg Cpdr (Omeprazole) .Marland Kitchen.. 1 by mouth two  times a day 2)  Fluticasone Propionate 50 Mcg/act Susp (Fluticasone propionate) .Marland Kitchen.. 1-2 sprays two times a day as needed  Other Orders: Est. Patient Level IV ([address])  Patient Instructions: 1)  If your breathing continues to bother you the next step is to have Dr [name] try you on a different form coreg since blocks the full effect of symbicort  2)  omeprazole should be taken 30-60 min before first and last meal  3)  Please schedule a follow-up appointment as needed.  Appended Document: Pulmonary/ f/u copd with hfa @ 75% p coaching Late add: note she is on coreg and has airflow obstruction but not much asthma/ if worsens need to consider Bystolic, the most beta -1  selective Beta blocker available in sample form, with bisoprolol the most selective generic choice  on the market.

## 2010-06-25 NOTE — Progress Notes (Signed)
Summary: nos appt  Phone Note Call from Patient   Caller: [email or web address]  Call For: wert Summary of Call: Rsc nos from 1/5 to 1/23. Initial call taken by: Darletta Moll,  May 29, 2010 9:57 AM

## 2010-06-25 NOTE — Progress Notes (Signed)
Summary: thrush > rx diflucan  Phone Note Call from Patient Call back at Home Phone [phone]   Caller: Patient Call For: wert Reason for Call: Talk to Nurse Summary of Call: Patient calling saying she has thrush.  She states on roof of mouth, side of cheeks, and tongue.  Patient says she has used magic mouthwash with no help.  Asking for rx to help w/ this.  [facility] [address] Initial call taken by: Lehman Prom,  June 11, 2010 12:27 PM  Follow-up for Phone Call        The patient c/o white patches on the back of her tongue, sides of her cheeks and in her throat. She has been using MMW but this is nor helping. The Symbicort is new for her and she says she has been rinsing her mouth well after using the Symbicort. She is requesting Diflucan be called to her pharmacy. Pls advise.Michel Bickers CMA  June 11, 2010 2:18 PM ok 100mg  daily x 5 days Follow-up by: Nyoka Cowden MD,  June 11, 2010 4:25 PM    New/Updated Medications: DIFLUCAN 100 MG TABS (FLUCONAZOLE) 1 by mouth once daily x 5 days Prescriptions: DIFLUCAN 100 MG TABS (FLUCONAZOLE) 1 by mouth once daily x 5 days  #5 x 0   Entered by:   Vernie Murders   Authorized by:   Nyoka Cowden MD   Signed by:   Vernie Murders on 06/11/2010   Method used:   [facility]  [address] [phone][facility] (retail)       [address]       [place], [place]  [address]       Ph: [phone]       Fax: [phone]   RxID:   [id number]   Appended Document: thrush > rx diflucan Rx was sent to pharm. pt aware.

## 2010-06-26 NOTE — Consult Note (Signed)
[facility]   Imported By: Roderic Ovens 06/16/2009 12:27:06  _____________________________________________________________________  External Attachment:    Type:   Image     Comment:   External Document

## 2010-07-13 ENCOUNTER — Telehealth: Payer: Self-pay | Admitting: Internal Medicine

## 2010-07-14 ENCOUNTER — Telehealth (INDEPENDENT_AMBULATORY_CARE_PROVIDER_SITE_OTHER): Payer: Self-pay | Admitting: *Deleted

## 2010-07-14 DIAGNOSIS — Z9289 Personal history of other medical treatment: Secondary | ICD-10-CM

## 2010-07-14 HISTORY — DX: Personal history of other medical treatment: Z92.89

## 2010-07-15 ENCOUNTER — Telehealth (INDEPENDENT_AMBULATORY_CARE_PROVIDER_SITE_OTHER): Payer: Self-pay | Admitting: *Deleted

## 2010-07-21 NOTE — Progress Notes (Signed)
Summary: PA for [facility] until 07-15-2012  Phone Note Other Incoming   Caller: [facility] [address] Summary of Call: [facility]'s S[facility]ort need prior auth.  Called ins at [phone] and got this approved x 2 years.  Approval will expire on 07-15-2012.  Called and spoke with [facility] and informed them med approved.  Aundra Millet Reynolds LPN  July 15, 2010 4:20 PM  Initial call taken by: Arman Filter LPN,  July 15, 2010 4:20 PM

## 2010-07-21 NOTE — Progress Notes (Signed)
Summary: Medication   Phone Note Call from Patient Call back at Home Phone [phone]   Caller: Patient Call For: Dr. [name] Reason for Call: Talk to Nurse Summary of Call: Needs her Symbicort refilled.........[facility] Initial call taken by: Karna Christmas,  July 13, 2010 10:52 AM  Follow-up for Phone Call        left message for patient that she needs to get symbicort from Dr Judie Petit.Wert since he is her pulmonologist and this is a pulmonology drug. Follow-up by: Lamona Curl CMA (AAMA),  July 13, 2010 1:52 PM

## 2010-07-21 NOTE — Progress Notes (Signed)
Summary: rx-SYMBICORT  Phone Note Call from Patient Call back at Home Phone [phone]   Caller: Patient Call For: wert Reason for Call: Refill Medication, Talk to Nurse Summary of Call: Patient states she has been getting samples of symbicort but is now needing a rx sent to pharmacy.  [facility] [address] Initial call taken by: Lehman Prom,  July 14, 2010 11:57 AM  Follow-up for Phone Call        Pt aware that RX has been sent to pharmacy as requested.Reynaldo Minium CMA  July 14, 2010 12:05 PM     Prescriptions: SYMBICORT 160-4.5 MCG/ACT  AERO (BUDESONIDE-FORMOTEROL FUMARATE) 2 puffs first thing  in am and 2 puffs again in pm about 12 hours later  #1 x 5   Entered by:   Reynaldo Minium CMA   Authorized by:   Nyoka Cowden MD   Signed by:   Reynaldo Minium CMA on 07/14/2010   Method used:   [facility]        [facility]  [address] [phone][facility] (retail)       [address]       [place], [place]  [address]       Ph: [phone]       Fax: [phone]   RxID:   [id number]

## 2010-07-30 ENCOUNTER — Telehealth (INDEPENDENT_AMBULATORY_CARE_PROVIDER_SITE_OTHER): Payer: Self-pay | Admitting: *Deleted

## 2010-07-31 ENCOUNTER — Encounter (HOSPITAL_COMMUNITY)
Admission: RE | Admit: 2010-07-31 | Discharge: 2010-07-31 | Disposition: A | Discharge status: Home / Self Care | Payer: Medicaid Other | Source: Ambulatory Visit | Attending: Orthopedic Surgery | Admitting: Orthopedic Surgery

## 2010-07-31 LAB — COMPREHENSIVE METABOLIC PANEL
Albumin: 4.8 g/dL (ref 3.5–5.2)
BUN: 3 mg/dL — ABNORMAL LOW (ref 6–23)
Creatinine, Ser: 0.66 mg/dL (ref 0.4–1.2)
Total Bilirubin: 0.7 mg/dL (ref 0.3–1.2)
Total Protein: 7.4 g/dL (ref 6.0–8.3)

## 2010-07-31 LAB — DIFFERENTIAL
Basophils Relative: 1 % (ref 0–1)
Eosinophils Absolute: 0 10*3/uL (ref 0.0–0.7)
Lymphs Abs: 2.1 10*3/uL (ref 0.7–4.0)
Neutrophils Relative %: 69 % (ref 43–77)

## 2010-07-31 LAB — URINALYSIS, ROUTINE W REFLEX MICROSCOPIC
Nitrite: NEGATIVE
Specific Gravity, Urine: 1.005 (ref 1.005–1.030)
pH: 6 (ref 5.0–8.0)

## 2010-07-31 LAB — APTT: aPTT: 35 seconds (ref 24–37)

## 2010-07-31 LAB — PROTIME-INR: INR: 0.98 (ref 0.00–1.49)

## 2010-07-31 LAB — SURGICAL PCR SCREEN: Staphylococcus aureus: NEGATIVE

## 2010-07-31 LAB — CBC
Platelets: 256 10*3/uL (ref 150–400)
RBC: 4.97 MIL/uL (ref 3.87–5.11)
WBC: 8.6 10*3/uL (ref 4.0–10.5)

## 2010-07-31 LAB — VITAMIN D 25 HYDROXY (VIT D DEFICIENCY, FRACTURES): Vit D, 25-Hydroxy: 12 ng/mL — ABNORMAL LOW (ref 30–89)

## 2010-08-04 ENCOUNTER — Inpatient Hospital Stay (HOSPITAL_COMMUNITY): Payer: Medicaid Other

## 2010-08-04 ENCOUNTER — Other Ambulatory Visit: Payer: Self-pay | Admitting: Orthopedic Surgery

## 2010-08-04 ENCOUNTER — Inpatient Hospital Stay (HOSPITAL_COMMUNITY)
Admission: RE | Admit: 2010-08-04 | Discharge: 2010-08-11 | DRG: 457 | Disposition: A | Discharge status: Home / Self Care | Payer: Medicaid Other | Source: Ambulatory Visit | Attending: Orthopedic Surgery | Admitting: Orthopedic Surgery

## 2010-08-04 DIAGNOSIS — B370 Candidal stomatitis: Secondary | ICD-10-CM | POA: Diagnosis not present

## 2010-08-04 DIAGNOSIS — Z01810 Encounter for preprocedural cardiovascular examination: Secondary | ICD-10-CM

## 2010-08-04 DIAGNOSIS — I2510 Atherosclerotic heart disease of native coronary artery without angina pectoris: Secondary | ICD-10-CM | POA: Diagnosis present

## 2010-08-04 DIAGNOSIS — D649 Anemia, unspecified: Secondary | ICD-10-CM | POA: Diagnosis present

## 2010-08-04 DIAGNOSIS — Z01812 Encounter for preprocedural laboratory examination: Secondary | ICD-10-CM

## 2010-08-04 DIAGNOSIS — M4000 Postural kyphosis, site unspecified: Principal | ICD-10-CM | POA: Diagnosis present

## 2010-08-04 DIAGNOSIS — IMO0002 Reserved for concepts with insufficient information to code with codable children: Secondary | ICD-10-CM | POA: Diagnosis present

## 2010-08-04 DIAGNOSIS — E876 Hypokalemia: Secondary | ICD-10-CM | POA: Diagnosis present

## 2010-08-04 LAB — PROTIME-INR
INR: 1.4 (ref 0.00–1.49)
Prothrombin Time: 17.4 seconds — ABNORMAL HIGH (ref 11.6–15.2)

## 2010-08-04 LAB — CBC
HCT: 25 % — ABNORMAL LOW (ref 36.0–46.0)
Hemoglobin: 8.7 g/dL — ABNORMAL LOW (ref 12.0–15.0)
MCH: 32 pg (ref 26.0–34.0)
MCHC: 34.8 g/dL (ref 30.0–36.0)
MCV: 91.9 fL (ref 78.0–100.0)
RBC: 2.72 MIL/uL — ABNORMAL LOW (ref 3.87–5.11)

## 2010-08-04 LAB — FIBRINOGEN: Fibrinogen: 203 mg/dL — ABNORMAL LOW (ref 204–475)

## 2010-08-04 NOTE — Progress Notes (Signed)
Summary: thrush---rx for diflucan  Phone Note Call from Patient Call back at Home Phone [phone]   Caller: Patient Call For: wert Reason for Call: Talk to Nurse Summary of Call: The patient c/o white patches on the back of her tongue, sides of her cheeks and in her throat.  She has had this before from using Symbicort asking for rx.  [facility] call taken by: Lehman Prom,  July 30, 2010 4:50 PM  Follow-up for Phone Call        called and spoke with pt.  pt states she has had thrush in the past and believe she has it again.  pt c/o white patches on her throat and cheeks and mouth is sore.  pt states she uses symbicort and does rinse mouth out well after use.  pt requesting rx for thrush.  please advise.  thank you.  Aundra Millet Reynolds LPN  July 29, 1476 5:16 PM  Allergies (verified):  1)  ! * Amiodarone 2)  ! * Multaq 3)  ! * Plavix  Additional Follow-up for Phone Call Additional follow up Details #1::        OK to send diflucan 150 mg, # 3, 1 daily, no ref Additional Follow-up by: Waymon Budge MD,  July 30, 2010 5:34 PM    Additional Follow-up for Phone Call Additional follow up Details #2::    rx sent to pharmacy.  pt aware. Arman Filter LPN  July 30, 2954 5:37 PM   New/Updated Medications: DIFLUCAN 150 MG TABS (FLUCONAZOLE) Take 1 tablet by mouth once a day until gone. Prescriptions: DIFLUCAN 150 MG TABS (FLUCONAZOLE) Take 1 tablet by mouth once a day until gone.  #3 x 0   Entered by:   Arman Filter LPN   Authorized by:   Waymon Budge MD   Signed by:   Arman Filter LPN on 21/30/8657   Method used:   [facility]        [facility]  [address] [phone][facility] ([facility]ail[facility]       [address]       [place], [place]  [address]       Ph: [phone]       Fax: [phone]   RxID:   [id number]

## 2010-08-05 LAB — TYPE AND SCREEN
ABO/RH(D): B POS
Antibody Screen: POSITIVE
PT AG Type: NEGATIVE

## 2010-08-05 LAB — CBC
Hemoglobin: 12 g/dL (ref 12.0–15.0)
MCH: 31.4 pg (ref 26.0–34.0)
Platelets: 158 10*3/uL (ref 150–400)
RBC: 3.82 MIL/uL — ABNORMAL LOW (ref 3.87–5.11)

## 2010-08-05 LAB — BASIC METABOLIC PANEL
BUN: 6 mg/dL (ref 6–23)
Calcium: 7.6 mg/dL — ABNORMAL LOW (ref 8.4–10.5)
GFR calc non Af Amer: >60 mL/min (ref >60)
Glucose, Bld: 124 mg/dL — ABNORMAL HIGH (ref 70–99)
Potassium: 3.7 mEq/L (ref 3.5–5.1)
Sodium: 137 mEq/L (ref 135–145)

## 2010-08-05 LAB — PROTIME-INR
INR: 1.4 (ref 0.00–1.49)
Prothrombin Time: 17.4 seconds — ABNORMAL HIGH (ref 11.6–15.2)
Prothrombin Time: 16.2 seconds — ABNORMAL HIGH (ref 11.6–15.2)

## 2010-08-06 LAB — POCT I-STAT 7, (LYTES, BLD GAS, ICA,H+H)
Acid-Base Excess: 1 mmol/L (ref 0.0–2.0)
Acid-base deficit: 1 mmol/L (ref 0.0–2.0)
Bicarbonate: 25.6 mEq/L — ABNORMAL HIGH (ref 20.0–24.0)
Calcium, Ion: 1.2 mmol/L (ref 1.12–1.32)
HCT: 26 % — ABNORMAL LOW (ref 36.0–46.0)
HCT: 33 % — ABNORMAL LOW (ref 36.0–46.0)
O2 Saturation: 100 %
O2 Saturation: 100 %
Potassium: 4.2 mEq/L (ref 3.5–5.1)
Sodium: 139 mEq/L (ref 135–145)
TCO2: 27 mmol/L (ref 0–100)
pO2, Arterial: 444 mmHg — ABNORMAL HIGH (ref 80.0–100.0)

## 2010-08-06 LAB — CBC
HCT: 26.5 % — ABNORMAL LOW (ref 36.0–46.0)
Hemoglobin: 9.1 g/dL — ABNORMAL LOW (ref 12.0–15.0)
MCH: 32.3 pg (ref 26.0–34.0)
MCHC: 35.1 g/dL (ref 30.0–36.0)
RDW: 13.4 % (ref 11.5–15.5)

## 2010-08-06 LAB — BASIC METABOLIC PANEL
CO2: 28 mEq/L (ref 19–32)
Calcium: 6.9 mg/dL — ABNORMAL LOW (ref 8.4–10.5)
Creatinine, Ser: 0.4 mg/dL (ref 0.4–1.2)
GFR calc non Af Amer: >60 mL/min (ref >60)
Glucose, Bld: 90 mg/dL (ref 70–99)

## 2010-08-06 LAB — PROTIME-INR
INR: 1.31 (ref 0.00–1.49)
Prothrombin Time: 16.5 seconds — ABNORMAL HIGH (ref 11.6–15.2)

## 2010-08-07 LAB — CBC
HCT: 31.2 % — ABNORMAL LOW (ref 36.0–46.0)
Hemoglobin: 10.7 g/dL — ABNORMAL LOW (ref 12.0–15.0)
MCV: 91.5 fL (ref 78.0–100.0)
RBC: 3.41 MIL/uL — ABNORMAL LOW (ref 3.87–5.11)
RDW: 12.7 % (ref 11.5–15.5)
WBC: 8.1 10*3/uL (ref 4.0–10.5)

## 2010-08-07 LAB — BASIC METABOLIC PANEL
CO2: 29 mEq/L (ref 19–32)
Chloride: 102 mEq/L (ref 96–112)
Creatinine, Ser: 0.44 mg/dL (ref 0.4–1.2)
GFR calc Af Amer: >60 mL/min (ref >60)
Potassium: 4 mEq/L (ref 3.5–5.1)
Sodium: 135 mEq/L (ref 135–145)

## 2010-08-07 LAB — PROTIME-INR: INR: 1.14 (ref 0.00–1.49)

## 2010-08-08 LAB — BASIC METABOLIC PANEL
BUN: 3 mg/dL — ABNORMAL LOW (ref 6–23)
CO2: 29 mEq/L (ref 19–32)
Chloride: 100 mEq/L (ref 96–112)
Potassium: 3.6 mEq/L (ref 3.5–5.1)

## 2010-08-08 LAB — CBC
Hemoglobin: 10.6 g/dL — ABNORMAL LOW (ref 12.0–15.0)
MCV: 89.8 fL (ref 78.0–100.0)
Platelets: 175 10*3/uL (ref 150–400)
RBC: 3.32 MIL/uL — ABNORMAL LOW (ref 3.87–5.11)
WBC: 6.4 10*3/uL (ref 4.0–10.5)

## 2010-08-08 LAB — TYPE AND SCREEN
ABO/RH(D): B POS
Antibody Screen: POSITIVE
Donor AG Type: NEGATIVE
Unit division: 0

## 2010-08-08 LAB — PROTIME-INR
INR: 1.1 (ref 0.00–1.49)
Prothrombin Time: 14.4 seconds (ref 11.6–15.2)

## 2010-08-09 LAB — CBC
HCT: 28.6 % — ABNORMAL LOW (ref 36.0–46.0)
HCT: 30.4 % — ABNORMAL LOW (ref 36.0–46.0)
HCT: 37.9 % (ref 36.0–46.0)
Hemoglobin: 9.9 g/dL — ABNORMAL LOW (ref 12.0–15.0)
Hemoglobin: 10.9 g/dL — ABNORMAL LOW (ref 12.0–15.0)
Hemoglobin: 13.2 g/dL (ref 12.0–15.0)
MCHC: 34.8 g/dL (ref 30.0–36.0)
MCHC: 34.9 g/dL (ref 30.0–36.0)
MCHC: 35.3 g/dL (ref 30.0–36.0)
MCHC: 35.5 g/dL (ref 30.0–36.0)
MCV: 95.6 fL (ref 78.0–100.0)
MCV: 95.8 fL (ref 78.0–100.0)
MCV: 96.2 fL (ref 78.0–100.0)
MCV: 96.6 fL (ref 78.0–100.0)
Platelets: 156 10*3/uL (ref 150–400)
Platelets: 178 10*3/uL (ref 150–400)
Platelets: 186 10*3/uL (ref 150–400)
Platelets: 206 10*3/uL (ref 150–400)
RBC: 3.21 MIL/uL — ABNORMAL LOW (ref 3.87–5.11)
RBC: 3.22 MIL/uL — ABNORMAL LOW (ref 3.87–5.11)
RBC: 3.37 MIL/uL — ABNORMAL LOW (ref 3.87–5.11)
RBC: 3.72 MIL/uL — ABNORMAL LOW (ref 3.87–5.11)
RBC: 3.94 MIL/uL (ref 3.87–5.11)
RDW: 12.6 % (ref 11.5–15.5)
RDW: 12.7 % (ref 11.5–15.5)
WBC: 15.6 10*3/uL — ABNORMAL HIGH (ref 4.0–10.5)
WBC: 5.4 10*3/uL (ref 4.0–10.5)
WBC: 5.7 10*3/uL (ref 4.0–10.5)
WBC: 9.5 10*3/uL (ref 4.0–10.5)

## 2010-08-09 LAB — HEPARIN LEVEL (UNFRACTIONATED)
Heparin Unfractionated: 0.16 IU/mL — ABNORMAL LOW (ref 0.30–0.70)
Heparin Unfractionated: 0.34 IU/mL (ref 0.30–0.70)

## 2010-08-09 LAB — PROTIME-INR
INR: 1.17 (ref 0.00–1.49)
Prothrombin Time: 15.1 seconds (ref 11.6–15.2)
Prothrombin Time: 14.6 seconds (ref 11.6–15.2)

## 2010-08-09 LAB — BASIC METABOLIC PANEL
BUN: 5 mg/dL — ABNORMAL LOW (ref 6–23)
CO2: 22 mEq/L (ref 19–32)
CO2: 27 mEq/L (ref 19–32)
CO2: 27 mEq/L (ref 19–32)
CO2: 29 mEq/L (ref 19–32)
Calcium: 7.7 mg/dL — ABNORMAL LOW (ref 8.4–10.5)
Calcium: 8.6 mg/dL (ref 8.4–10.5)
Calcium: 8.6 mg/dL (ref 8.4–10.5)
Calcium: 8.6 mg/dL (ref 8.4–10.5)
Chloride: 103 mEq/L (ref 96–112)
Chloride: 105 mEq/L (ref 96–112)
Chloride: 105 mEq/L (ref 96–112)
Chloride: 110 mEq/L (ref 96–112)
Creatinine, Ser: 0.57 mg/dL (ref 0.4–1.2)
Creatinine, Ser: 0.64 mg/dL (ref 0.4–1.2)
Creatinine, Ser: 0.65 mg/dL (ref 0.4–1.2)
GFR calc Af Amer: >60 mL/min (ref >60)
GFR calc Af Amer: >60 mL/min (ref >60)
GFR calc Af Amer: >60 mL/min (ref >60)
GFR calc Af Amer: >60 mL/min (ref >60)
GFR calc Af Amer: >60 mL/min (ref >60)
GFR calc non Af Amer: >60 mL/min (ref >60)
GFR calc non Af Amer: >60 mL/min (ref >60)
GFR calc non Af Amer: >60 mL/min (ref >60)
Glucose, Bld: 116 mg/dL — ABNORMAL HIGH (ref 70–99)
Potassium: 3.4 mEq/L — ABNORMAL LOW (ref 3.5–5.1)
Potassium: 3.1 mEq/L — ABNORMAL LOW (ref 3.5–5.1)
Sodium: 136 mEq/L (ref 135–145)
Sodium: 133 mEq/L — ABNORMAL LOW (ref 135–145)
Sodium: 135 mEq/L (ref 135–145)
Sodium: 137 mEq/L (ref 135–145)
Sodium: 141 mEq/L (ref 135–145)
Sodium: 141 mEq/L (ref 135–145)

## 2010-08-09 LAB — LIPID PANEL
Cholesterol: 161 mg/dL (ref 0–200)
HDL: 59 mg/dL (ref >39)
Total CHOL/HDL Ratio: 2.7 RATIO
Triglycerides: 55 mg/dL (ref <150)

## 2010-08-09 LAB — POCT I-STAT, CHEM 8
Creatinine, Ser: 0.6 mg/dL (ref 0.4–1.2)
Hemoglobin: 12.9 g/dL (ref 12.0–15.0)
Potassium: 3 mEq/L — ABNORMAL LOW (ref 3.5–5.1)
Sodium: 137 mEq/L (ref 135–145)

## 2010-08-09 LAB — HEPATIC FUNCTION PANEL
AST: 55 U/L — ABNORMAL HIGH (ref 0–37)
Albumin: 3.1 g/dL — ABNORMAL LOW (ref 3.5–5.2)
Alkaline Phosphatase: 88 U/L (ref 39–117)
Total Bilirubin: 0.8 mg/dL (ref 0.3–1.2)

## 2010-08-09 LAB — DIFFERENTIAL
Basophils Relative: 0 % (ref 0–1)
Basophils Relative: 0 % (ref 0–1)
Basophils Relative: 1 % (ref 0–1)
Eosinophils Absolute: 0.1 10*3/uL (ref 0.0–0.7)
Eosinophils Absolute: 0.1 10*3/uL (ref 0.0–0.7)
Eosinophils Relative: 1 % (ref 0–5)
Eosinophils Relative: 1 % (ref 0–5)
Lymphocytes Relative: 27 % (ref 12–46)
Lymphs Abs: 0.9 10*3/uL (ref 0.7–4.0)
Lymphs Abs: 1.3 10*3/uL (ref 0.7–4.0)
Lymphs Abs: 1.7 10*3/uL (ref 0.7–4.0)
Lymphs Abs: 1.8 10*3/uL (ref 0.7–4.0)
Lymphs Abs: 5.5 10*3/uL — ABNORMAL HIGH (ref 0.7–4.0)
Monocytes Absolute: 0.4 10*3/uL (ref 0.1–1.0)
Monocytes Absolute: 0.5 10*3/uL (ref 0.1–1.0)
Monocytes Absolute: 0.9 10*3/uL (ref 0.1–1.0)
Monocytes Relative: 6 % (ref 3–12)
Monocytes Relative: 6 % (ref 3–12)
Monocytes Relative: 7 % (ref 3–12)
Monocytes Relative: 7 % (ref 3–12)
Monocytes Relative: 8 % (ref 3–12)
Monocytes Relative: 9 % (ref 3–12)
Neutro Abs: 3.7 10*3/uL (ref 1.7–7.7)
Neutro Abs: 4 10*3/uL (ref 1.7–7.7)
Neutro Abs: 4.2 10*3/uL (ref 1.7–7.7)
Neutro Abs: 7 10*3/uL (ref 1.7–7.7)
Neutrophils Relative %: 58 % (ref 43–77)
Neutrophils Relative %: 64 % (ref 43–77)
Neutrophils Relative %: 66 % (ref 43–77)
Neutrophils Relative %: 72 % (ref 43–77)
Neutrophils Relative %: 74 % (ref 43–77)
Neutrophils Relative %: 75 % (ref 43–77)

## 2010-08-09 LAB — CARDIAC PANEL(CRET KIN+CKTOT+MB+TROPI)
CK, MB: 1.8 ng/mL (ref 0.3–4.0)
CK, MB: 1.9 ng/mL (ref 0.3–4.0)
CK, MB: 106.1 ng/mL (ref 0.3–4.0)
CK, MB: 118.9 ng/mL (ref 0.3–4.0)
CK, MB: 75.8 ng/mL (ref 0.3–4.0)
Relative Index: 12.5 — ABNORMAL HIGH (ref 0.0–2.5)
Relative Index: 14.2 — ABNORMAL HIGH (ref 0.0–2.5)
Total CK: 113 U/L (ref 7–177)
Total CK: 124 U/L (ref 7–177)
Total CK: 155 U/L (ref 7–177)
Total CK: 738 U/L — ABNORMAL HIGH (ref 7–177)
Total CK: 746 U/L — ABNORMAL HIGH (ref 7–177)
Total CK: 953 U/L — ABNORMAL HIGH (ref 7–177)
Troponin I: 1.2 ng/mL (ref 0.00–0.06)

## 2010-08-09 LAB — COMPREHENSIVE METABOLIC PANEL
Albumin: 2.7 g/dL — ABNORMAL LOW (ref 3.5–5.2)
Alkaline Phosphatase: 71 U/L (ref 39–117)
BUN: 3 mg/dL — ABNORMAL LOW (ref 6–23)
Calcium: 8.1 mg/dL — ABNORMAL LOW (ref 8.4–10.5)
Potassium: 3.8 mEq/L (ref 3.5–5.1)
Total Protein: 4.8 g/dL — ABNORMAL LOW (ref 6.0–8.3)

## 2010-08-09 LAB — EXPECTORATED SPUTUM ASSESSMENT W GRAM STAIN, RFLX TO RESP C

## 2010-08-09 LAB — POCT CARDIAC MARKERS
CKMB, poc: <1 ng/mL — ABNORMAL LOW (ref 1.0–8.0)
Myoglobin, poc: 101 ng/mL (ref 12–200)

## 2010-08-09 LAB — BRAIN NATRIURETIC PEPTIDE: Pro B Natriuretic peptide (BNP): 508 pg/mL — ABNORMAL HIGH (ref 0.0–100.0)

## 2010-08-10 LAB — CBC
HCT: 33.9 % — ABNORMAL LOW (ref 36.0–46.0)
MCHC: 35.1 g/dL (ref 30.0–36.0)
Platelets: 269 10*3/uL (ref 150–400)
RDW: 12.3 % (ref 11.5–15.5)

## 2010-08-10 LAB — BASIC METABOLIC PANEL
BUN: 5 mg/dL — ABNORMAL LOW (ref 6–23)
Calcium: 8.9 mg/dL (ref 8.4–10.5)
GFR calc non Af Amer: >60 mL/min (ref >60)
Glucose, Bld: 93 mg/dL (ref 70–99)
Sodium: 137 mEq/L (ref 135–145)

## 2010-08-10 LAB — PROTIME-INR
INR: 1.11 (ref 0.00–1.49)
Prothrombin Time: 14.5 seconds (ref 11.6–15.2)

## 2010-08-11 LAB — CARDIAC PANEL(CRET KIN+CKTOT+MB+TROPI)
CK, MB: 1.3 ng/mL (ref 0.3–4.0)
Relative Index: INVALID (ref 0.0–2.5)
Total CK: 69 U/L (ref 7–177)
Troponin I: 0.01 ng/mL (ref 0.00–0.06)

## 2010-08-11 LAB — URINALYSIS, ROUTINE W REFLEX MICROSCOPIC
Glucose, UA: NEGATIVE mg/dL
Protein, ur: NEGATIVE mg/dL
pH: 6 (ref 5.0–8.0)

## 2010-08-11 LAB — COMPREHENSIVE METABOLIC PANEL
ALT: 15 U/L (ref 0–35)
AST: 18 U/L (ref 0–37)
Albumin: 3.4 g/dL — ABNORMAL LOW (ref 3.5–5.2)
Alkaline Phosphatase: 87 U/L (ref 39–117)
BUN: 4 mg/dL — ABNORMAL LOW (ref 6–23)
Chloride: 112 mEq/L (ref 96–112)
GFR calc Af Amer: >60 mL/min (ref >60)
Potassium: 3.5 mEq/L (ref 3.5–5.1)
Sodium: 142 mEq/L (ref 135–145)
Total Bilirubin: 0.5 mg/dL (ref 0.3–1.2)

## 2010-08-11 LAB — CBC
HCT: 32.9 % — ABNORMAL LOW (ref 36.0–46.0)
HCT: 34.1 % — ABNORMAL LOW (ref 36.0–46.0)
Hemoglobin: 12 g/dL (ref 12.0–15.0)
MCH: 31.3 pg (ref 26.0–34.0)
MCV: 89.8 fL (ref 78.0–100.0)
Platelets: 295 10*3/uL (ref 150–400)
Platelets: 201 10*3/uL (ref 150–400)
RBC: 3.6 MIL/uL — ABNORMAL LOW (ref 3.87–5.11)
RDW: 12.2 % (ref 11.5–15.5)
WBC: 7.3 10*3/uL (ref 4.0–10.5)
WBC: 5.9 10*3/uL (ref 4.0–10.5)
WBC: 7.1 10*3/uL (ref 4.0–10.5)
WBC: 8 10*3/uL (ref 4.0–10.5)

## 2010-08-11 LAB — CK TOTAL AND CKMB (NOT AT ARMC)
Relative Index: INVALID (ref 0.0–2.5)
Total CK: 77 U/L (ref 7–177)

## 2010-08-11 LAB — PROTIME-INR
Prothrombin Time: 14.9 seconds (ref 11.6–15.2)
Prothrombin Time: 14.6 seconds (ref 11.6–15.2)

## 2010-08-11 LAB — BASIC METABOLIC PANEL
BUN: 4 mg/dL — ABNORMAL LOW (ref 6–23)
Calcium: 8.9 mg/dL (ref 8.4–10.5)
Creatinine, Ser: 0.51 mg/dL (ref 0.4–1.2)
Creatinine, Ser: 0.52 mg/dL (ref 0.4–1.2)
GFR calc Af Amer: >60 mL/min (ref >60)
GFR calc non Af Amer: >60 mL/min (ref >60)
GFR calc non Af Amer: >60 mL/min (ref >60)

## 2010-08-11 LAB — DIFFERENTIAL
Basophils Absolute: 0 10*3/uL (ref 0.0–0.1)
Basophils Relative: 0 % (ref 0–1)
Eosinophils Absolute: 0 10*3/uL (ref 0.0–0.7)
Eosinophils Relative: 0 % (ref 0–5)
Monocytes Absolute: 0.5 10*3/uL (ref 0.1–1.0)

## 2010-08-11 LAB — D-DIMER, QUANTITATIVE: D-Dimer, Quant: <0.22 ug/mL-FEU (ref 0.00–0.48)

## 2010-08-11 LAB — POCT CARDIAC MARKERS: Myoglobin, poc: 33.2 ng/mL (ref 12–200)

## 2010-08-17 LAB — CBC
HCT: 35.1 % — ABNORMAL LOW (ref 36.0–46.0)
HCT: 35.6 % — ABNORMAL LOW (ref 36.0–46.0)
HCT: 40.3 % (ref 36.0–46.0)
Hemoglobin: 12.4 g/dL (ref 12.0–15.0)
MCHC: 34.7 g/dL (ref 30.0–36.0)
MCHC: 34.7 g/dL (ref 30.0–36.0)
MCV: 96.9 fL (ref 78.0–100.0)
MCV: 97.3 fL (ref 78.0–100.0)
MCV: 97.6 fL (ref 78.0–100.0)
MCV: 97.6 fL (ref 78.0–100.0)
Platelets: 191 10*3/uL (ref 150–400)
Platelets: 197 10*3/uL (ref 150–400)
Platelets: 200 10*3/uL (ref 150–400)
Platelets: 216 10*3/uL (ref 150–400)
RBC: 3.63 MIL/uL — ABNORMAL LOW (ref 3.87–5.11)
RBC: 3.91 MIL/uL (ref 3.87–5.11)
RDW: 13.5 % (ref 11.5–15.5)
RDW: 14.1 % (ref 11.5–15.5)
WBC: 6.3 10*3/uL (ref 4.0–10.5)
WBC: 7 10*3/uL (ref 4.0–10.5)
WBC: 7.9 10*3/uL (ref 4.0–10.5)
WBC: 8.1 10*3/uL (ref 4.0–10.5)

## 2010-08-17 LAB — HEPARIN LEVEL (UNFRACTIONATED)
Heparin Unfractionated: 0.3 IU/mL (ref 0.30–0.70)
Heparin Unfractionated: 0.32 IU/mL (ref 0.30–0.70)
Heparin Unfractionated: 0.42 IU/mL (ref 0.30–0.70)
Heparin Unfractionated: <0.1 IU/mL — ABNORMAL LOW (ref 0.30–0.70)

## 2010-08-17 LAB — BASIC METABOLIC PANEL
BUN: 4 mg/dL — ABNORMAL LOW (ref 6–23)
BUN: 6 mg/dL (ref 6–23)
BUN: 7 mg/dL (ref 6–23)
CO2: 31 mEq/L (ref 19–32)
Chloride: 101 mEq/L (ref 96–112)
Chloride: 105 mEq/L (ref 96–112)
Chloride: 106 mEq/L (ref 96–112)
Creatinine, Ser: 0.62 mg/dL (ref 0.4–1.2)
Creatinine, Ser: 0.68 mg/dL (ref 0.4–1.2)
GFR calc Af Amer: >60 mL/min (ref >60)
GFR calc Af Amer: >60 mL/min (ref >60)
GFR calc non Af Amer: >60 mL/min (ref >60)
Glucose, Bld: 104 mg/dL — ABNORMAL HIGH (ref 70–99)
Glucose, Bld: 95 mg/dL (ref 70–99)
Potassium: 3.7 mEq/L (ref 3.5–5.1)
Potassium: 4.1 mEq/L (ref 3.5–5.1)
Potassium: 4.1 mEq/L (ref 3.5–5.1)
Sodium: 140 mEq/L (ref 135–145)

## 2010-08-17 LAB — COMPREHENSIVE METABOLIC PANEL
AST: 23 U/L (ref 0–37)
Albumin: 3.5 g/dL (ref 3.5–5.2)
BUN: 5 mg/dL — ABNORMAL LOW (ref 6–23)
Calcium: 8 mg/dL — ABNORMAL LOW (ref 8.4–10.5)
Creatinine, Ser: 0.63 mg/dL (ref 0.4–1.2)
GFR calc Af Amer: >60 mL/min (ref >60)
Total Bilirubin: 0.4 mg/dL (ref 0.3–1.2)
Total Protein: 5.5 g/dL — ABNORMAL LOW (ref 6.0–8.3)

## 2010-08-17 LAB — CARDIAC PANEL(CRET KIN+CKTOT+MB+TROPI)
Relative Index: INVALID (ref 0.0–2.5)
Troponin I: 0.02 ng/mL (ref 0.00–0.06)

## 2010-08-17 LAB — DIFFERENTIAL
Basophils Absolute: 0 10*3/uL (ref 0.0–0.1)
Basophils Relative: 0 % (ref 0–1)
Eosinophils Absolute: 0 10*3/uL (ref 0.0–0.7)
Eosinophils Relative: 0 % (ref 0–5)
Lymphocytes Relative: 27 % (ref 12–46)
Lymphs Abs: 1.7 10*3/uL (ref 0.7–4.0)
Monocytes Absolute: 0.5 10*3/uL (ref 0.1–1.0)
Monocytes Relative: 6 % (ref 3–12)
Neutro Abs: 5.4 10*3/uL (ref 1.7–7.7)
Neutrophils Relative %: 67 % (ref 43–77)

## 2010-08-17 LAB — URINALYSIS, ROUTINE W REFLEX MICROSCOPIC
Bilirubin Urine: NEGATIVE
Hgb urine dipstick: NEGATIVE
Ketones, ur: NEGATIVE mg/dL
Specific Gravity, Urine: 1.007 (ref 1.005–1.030)
Urobilinogen, UA: 0.2 mg/dL (ref 0.0–1.0)

## 2010-08-17 LAB — TSH: TSH: 1.398 u[IU]/mL (ref 0.350–4.500)

## 2010-08-17 LAB — LIPID PANEL
LDL Cholesterol: 72 mg/dL (ref 0–99)
Total CHOL/HDL Ratio: 2.5 RATIO
VLDL: 7 mg/dL (ref 0–40)

## 2010-08-17 LAB — PROTIME-INR
INR: 1.14 (ref 0.00–1.49)
Prothrombin Time: 14.5 seconds (ref 11.6–15.2)

## 2010-08-17 LAB — BRAIN NATRIURETIC PEPTIDE: Pro B Natriuretic peptide (BNP): 44 pg/mL (ref 0.0–100.0)

## 2010-08-17 LAB — CK TOTAL AND CKMB (NOT AT ARMC)
CK, MB: 1.2 ng/mL (ref 0.3–4.0)
Relative Index: INVALID (ref 0.0–2.5)
Total CK: 75 U/L (ref 7–177)

## 2010-08-17 LAB — POCT I-STAT, CHEM 8
BUN: 4 mg/dL — ABNORMAL LOW (ref 6–23)
Calcium, Ion: 1.13 mmol/L (ref 1.12–1.32)
HCT: 37 % (ref 36.0–46.0)
Sodium: 140 mEq/L (ref 135–145)
TCO2: 27 mmol/L (ref 0–100)

## 2010-08-17 LAB — POCT CARDIAC MARKERS
Myoglobin, poc: 49.6 ng/mL (ref 12–200)
Troponin i, poc: <0.05 ng/mL (ref 0.00–0.09)

## 2010-08-17 LAB — APTT: aPTT: 37 seconds (ref 24–37)

## 2010-08-17 LAB — HEMOGLOBIN A1C: Mean Plasma Glucose: 111 mg/dL

## 2010-08-17 LAB — TROPONIN I: Troponin I: 0.01 ng/mL (ref 0.00–0.06)

## 2010-08-17 LAB — MAGNESIUM: Magnesium: 1.7 mg/dL (ref 1.5–2.5)

## 2010-08-17 NOTE — Op Note (Signed)
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  POSTOPERATIVE DIAGNOSIS:  Thoracolumbar kyphosis status post L2-S1 fusion, solid L2-3 fusion.  OPERATIVE PROCEDURE:  Osteotomy L1-2 including L1-2 disk excision, correction of thoracolumbar kyphosis, fusion T10-L2 utilizing autogenous iliac crest and local graft plus Bioglass posteriorly and posterolaterally; insertion of pedicle screws bilaterally T10 through L1 with rods.  OPERATIVE NOTE:  The patient was placed under general endotracheal anesthesia.  Foley catheter was placed in the bladder.  Intravenous vancomycin was infused prophylactically.  Sequential compression devices were placed on both lower extremities.  She was positioned prone on a Jackson frame.  Thoracolumbar area was prepped with DuraPrep and draped in rectangular fashion.  The drapes were secured with Ioban.  A time-out was held and the usual parameters identified and discussed.  The previous midline incision was re-incised in its upper one-half and the incision extended proximally to approximately the T9-10 junction. The skin was initially scored, the subcutaneous tissue injected with mixture of 0.25% plain Marcaine and 1% lidocaine with epinephrine. Dissection was then carried down to the intact spinous processes and the paraspinal muscles mobilized bilaterally.  Great care was taken at the upper level of the previous fusion because of the fact there had been a laminectomy at that level.  Because of the  kyphosis and the patient's body habitus, very thin, the location of the screws was easily identified.  Cross-table lateral radiograph was taken to determine the degree to which the kyphosis had corrected with foam positioning.  It had corrected some, but certainly incompletely.  Therefore, we knew we would need to do an osteotomy.  When we had exposed the transverse processes of T10, T11, T12, and L1, pedicle holes were made initially on the right side and then on the left.  This was done in the usual fashion and each instance, identifying the base of the transverse process and junction with superior articular process, perforating the pedicle and making a hole through the pedicle into the vertebral body.  Each hole was carefully palpated with a ball-tip probe to make sure it was circumferentially intact and sounded for depth.  The holes were all then pretapped and the 5.5 mm screws inserted.  Cross-table lateral radiographs were taken to ensure satisfactory position of screws.  One screw at T11 on the right appeared possibly to have penetrated through the superior endplate and was removed and the hole palpated and the circumference of the hole was intact and the screw was reinserted.  We then performed a posterior osteotomy at L1-2.  There was a large left disk herniation at L1-2 which I felt should be removed because when we straightened  that it would straighten out the kyphosis it probably would compress the exiting L1 nerve root.  Having removed the facet joint completely as part of the osteotomy, we exposed the lateral part of the disk and incised it and enucleated a moderate quantity of degenerate material and found one extruded fragment deep to the L1 nerve root.  I then attached side-to-side rod connectors between the L2 and L3 screws of the previously placed construct and then measured and cut rods which would extend from those side connectors which were placed laterally on the  existing rod and connected to rods proximally.  This was done in such a manner that we reduced the proximal screws to the rods to reduce the kyphosis.  When I had partial reduction, a cross-table lateral radiograph was taken which showed that there was no translocation occurring of the L1 on the L2 body posteriorly.  I had visualized the operative area on numerous occasions during the reduction.  We then continued further reduction of the proximal spine onto the rods which were anchored with the side-to-side connectors at the L2-3 level.  Once we had completed the reduction, another lateral radiograph was taken as well as a PA radiograph showing satisfactory reduction of the kyphosis without introducing any coronal plane balance.  At this point, all of the major screw couplings were given a final coupling maneuver and the side-to-side connector screws tightened and the appropriate screws torqued.  Not mentioned above is the fact that there was no hint of loosening or failure of healing at the L2-3 level previously fused in conjunction with the L2 to S1 fusion.  I did harvest a right posterior iliac bone graft through a small oblique incision over the right posterior iliac crest using a graft-retaining bone harvester.  This was mixed with bone which was removed during the osteotomy and with 10 mL of Bioglass.  I decorticated the transverse processes on the right side of L1 and L2 and placed copious quantities of graft between the transverse processes at that level.  I took a high-speed bur and performed thoracic facet joint resections at T10-11, T11-12, and T12-L1.  More mixture of graft and Bioglass was inserted at those levels as well.  We had another final inspection of the site of the osteotomy.  The dura did not appear compressed.  The defect was filled with FloSeal.  A 15-gauge Blake drain was then placed subfascially and brought out through the skin to the right side where it  was secured with a nylon suture.  The thoracolumbar fascia was closed using combination of interrupted and continuous #1 Vicryl sutures.  An 8-inch drain was run from the bone graft harvest site medially into the central wound and out through the skin to the right side and again secured with 2-0 nylon suture.  The deep fascia over the bone graft site was closed with one #1 Vicryl suture.  The subcutaneous tissue was closed with 2-0 interrupted Vicryl sutures.  The subcutaneous tissue of the main central wound was closed using interrupted and continuous 2-0 Vicryl suture.  The skin of both wounds was closed using continuous subcuticular running undyed 3-0 Vicryl suture.  Skin edges were reinforced with Steri-Strips. Nonadherent dressing was then applied.  There were no intraoperative complications.  Throughout the procedure, spinal cord monitoring was carried out.  Prior to attachment of rods, all screws were stimulated and levels of distal EMG activity were all in a safe zone.  Motor potentials and somatosensory evoked potentials  were stable throughout the procedure.  One of the tibialis anterior responses decreased in amplitude but nothing else and this was apparently at a point, there was change in anesthesia agents.  It was judged that it was not of significance because it was not associated with any other motor changes in either lower extremity and the site of action would have been L1-2 where one would not expect to have a specific and singular root affected.  Blood loss estimated between 1 and 1.5 liters.  The patient received Cell Saver blood and donor blood.  At the time of dictation, she has not yet been transferred to recovery room, so no examination is recorded here.  She will be transferred from the recovery room to a Step-Down Unit.  Sponge and needle counts were correct.     Nelda Severe, MD     MT/MEDQ  D:  08/04/2010  T:  08/05/2010  Job:   [id number]  Electronically Signed by Nelda Severe MD on 08/17/2010 02:13:23 PM

## 2010-08-26 NOTE — Discharge Summary (Signed)
  [name], [name]               ACCOUNT NO.:  [id number]  MEDICAL RECORD NO.:  [id number]           PATIENT TYPE:  I  LOCATION:  5003                         FACILITY:  [facility]  PHYSICIAN:  Nelda Severe, MD      DATE OF BIRTH:  February 13, 1951  DATE OF ADMISSION:  08/04/2010 DATE OF DISCHARGE:  08/11/2010                              DISCHARGE SUMMARY   FINAL DIAGNOSIS:  Thoracic kyphosis status post L2 to S1 fusion with L2- 3 pseudoarthrosis.  BRIEF HISTORY:  Ms. [name] was brought to [facility] on August 04, 2010, and underwent surgery by Dr. Nelda Severe to include osteotomy L1-2, disk excision at L1-2, thoracolumbar fusion with iliac crest bone graft harvest.  She was then admitted to step-down unit on 2600 for close observation.  A cardiac doctor helped [place] with her care due to her cardiac history.  Her cardiologist is Dr. [name] with [facility].  Her stay was uneventful.  She was then transferred to 5000.  She continued with physical therapy for ambulation and mobility.  Her hemoglobin was stabilized.  Potassium was dropped as low as 2.8.  She was given multiple doses of potassium currently at 3.4. She did get thrush in her mouth which is a usual occurrence for this lady because of all the oral medication she takes to control her seasonal allergies.  We did order Magic mouthwash and fluconazole p.o. tablets to help with the thrush.  Currently, she is walking independently with a rolling walker.  Her incision looks excellent. There is no sign of infection, no erythema, no active drainage.  She does have a TLSO brace that she can don and doff with minimal amount of assistance.  Both lower extremities are grossly neurovascularly motor intact.  She will be discharged home under the care of her family.  She will continue with her current home medications.  Medications that we are sending her home on will be: 1. Magic mouthwash swish and swallow 5 mL  q.4 p.r.n. 2. Hydrocodone 10/325 one to two q.4 p.r.n. for pain control. 3. Fentanyl patch 25 mcg q.72 h. 4. Robaxin [phone] mg q.6 p.r.n. for muscle spasms.  She will follow up in our office in approximately 3 weeks.  Walk for exercise.  No bending, stooping, lifting.  Diet is regular.  No dressing is needed.  Disposition is stable.     Lianne Cure, P.A.   ______________________________ Nelda Severe, MD    MC/MEDQ  D:  08/11/2010  T:  08/11/2010  Job:  [id number]  Electronically Signed by Lianne Cure P.A. on 08/20/2010 08:37:02 AM Electronically Signed by Nelda Severe MD on 08/26/2010 09:05:38 AM

## 2010-09-01 LAB — TROPONIN I: Troponin I: <0.01 ng/mL (ref 0.00–0.06)

## 2010-09-01 LAB — COMPREHENSIVE METABOLIC PANEL
ALT: 15 U/L (ref 0–35)
CO2: 29 mEq/L (ref 19–32)
Calcium: 8.4 mg/dL (ref 8.4–10.5)
Chloride: 106 mEq/L (ref 96–112)
Creatinine, Ser: 0.57 mg/dL (ref 0.4–1.2)
GFR calc non Af Amer: >60 mL/min (ref >60)
Glucose, Bld: 85 mg/dL (ref 70–99)
Sodium: 138 mEq/L (ref 135–145)
Total Bilirubin: 0.6 mg/dL (ref 0.3–1.2)

## 2010-09-01 LAB — CARDIAC PANEL(CRET KIN+CKTOT+MB+TROPI)
CK, MB: 1.6 ng/mL (ref 0.3–4.0)
Total CK: 85 U/L (ref 7–177)

## 2010-09-01 LAB — LIPID PANEL
Cholesterol: 181 mg/dL (ref 0–200)
LDL Cholesterol: 110 mg/dL — ABNORMAL HIGH (ref 0–99)

## 2010-09-01 LAB — CBC
Hemoglobin: 13.3 g/dL (ref 12.0–15.0)
MCHC: 34.7 g/dL (ref 30.0–36.0)
MCV: 95.8 fL (ref 78.0–100.0)
RBC: 3.99 MIL/uL (ref 3.87–5.11)
WBC: 6.1 10*3/uL (ref 4.0–10.5)

## 2010-09-01 LAB — CK TOTAL AND CKMB (NOT AT ARMC)
CK, MB: 1.5 ng/mL (ref 0.3–4.0)
Relative Index: INVALID (ref 0.0–2.5)
Total CK: 109 U/L (ref 7–177)

## 2010-09-02 LAB — CBC
HCT: 38.3 % (ref 36.0–46.0)
Hemoglobin: 13.6 g/dL (ref 12.0–15.0)
MCV: 94.4 fL (ref 78.0–100.0)
Platelets: 210 10*3/uL (ref 150–400)
RBC: 4.06 MIL/uL (ref 3.87–5.11)
WBC: 8.1 10*3/uL (ref 4.0–10.5)

## 2010-09-02 LAB — POCT CARDIAC MARKERS
CKMB, poc: <1 ng/mL — ABNORMAL LOW (ref 1.0–8.0)
Myoglobin, poc: 44.3 ng/mL (ref 12–200)
Troponin i, poc: <0.05 ng/mL (ref 0.00–0.09)
Troponin i, poc: <0.05 ng/mL (ref 0.00–0.09)

## 2010-09-02 LAB — DIFFERENTIAL
Eosinophils Absolute: 0 10*3/uL (ref 0.0–0.7)
Eosinophils Relative: 0 % (ref 0–5)
Lymphocytes Relative: 28 % (ref 12–46)
Lymphs Abs: 2.3 10*3/uL (ref 0.7–4.0)
Monocytes Absolute: 0.5 10*3/uL (ref 0.1–1.0)
Monocytes Relative: 7 % (ref 3–12)

## 2010-09-02 LAB — D-DIMER, QUANTITATIVE: D-Dimer, Quant: 0.26 ug/mL-FEU (ref 0.00–0.48)

## 2010-09-02 LAB — BASIC METABOLIC PANEL
BUN: 7 mg/dL (ref 6–23)
Chloride: 103 mEq/L (ref 96–112)
GFR calc non Af Amer: >60 mL/min (ref >60)
Potassium: 3.5 mEq/L (ref 3.5–5.1)
Sodium: 138 mEq/L (ref 135–145)

## 2010-09-08 LAB — CBC
HCT: 43.8 % (ref 36.0–46.0)
Platelets: 230 10*3/uL (ref 150–400)
WBC: 6.7 10*3/uL (ref 4.0–10.5)

## 2010-09-08 LAB — BASIC METABOLIC PANEL
BUN: 3 mg/dL — ABNORMAL LOW (ref 6–23)
Calcium: 9.7 mg/dL (ref 8.4–10.5)
GFR calc non Af Amer: >60 mL/min (ref >60)
Potassium: 4 mEq/L (ref 3.5–5.1)
Sodium: 140 mEq/L (ref 135–145)

## 2010-09-08 LAB — DIFFERENTIAL
Eosinophils Relative: 0 % (ref 0–5)
Lymphocytes Relative: 18 % (ref 12–46)
Lymphs Abs: 1.2 10*3/uL (ref 0.7–4.0)
Neutro Abs: 4.8 10*3/uL (ref 1.7–7.7)

## 2010-09-08 LAB — POCT CARDIAC MARKERS
CKMB, poc: 1 ng/mL (ref 1.0–8.0)
Myoglobin, poc: 58.5 ng/mL (ref 12–200)
Troponin i, poc: <0.05 ng/mL (ref 0.00–0.09)

## 2010-09-08 NOTE — H&P (Signed)
  [name], [name]               ACCOUNT NO.:  [id number]  MEDICAL RECORD NO.:  [id number]           PATIENT TYPE:  O  LOCATION:  SDS                          FACILITY:  [facility]  PHYSICIAN:  Nelda Severe, MD      DATE OF BIRTH:  28-Nov-1950  DATE OF ADMISSION:  07/31/2010 DATE OF DISCHARGE:  07/31/2010                             HISTORY & PHYSICAL   BRIEF HISTORY:  She has had lumbar pain for many years.  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  Medical history includes a drug allergy to Plavix.  Current medications include: 1. Nitrostat 0.4 mg p.r.n. 2. Norco 5 p.r.n. 3. Omeprazole 20 mg twice daily. 4. Baby aspirin daily. 5. Effient 10 mg daily. 6. Lipitor 80 mg at bedtime. 7. Zetia 10 mg daily. 8. Carvedilol 3.125 b.i.d. 9. Singulair 10 mg daily. 10.Symbicort 160/4.5 two puffs twice daily. 11.Nasonex twice daily. 12.Spiriva 18 mg once daily. 13.Amitriptyline 25 mg once at bedtime.  Past surgical history includes open heart surgery with 8-vessel bypass in 1995, left arterial stent in 1995, and then another stent in 2000. She has also had the 2 lumbar surgeries as mentioned above.  Medical history includes asthma, bronchitis, COPD, coronary artery disease, congestive heart failure, hiatal hernia, reflux, gastric ulcer.  Family history includes mother and father both have coronary artery disease, brain cancer and lung cancer causing death.  REVIEW OF SYSTEMS:  Today, she reports no fever, no chills, no chest pain.  She does have positive thrush in her mouth secondary to inhaler use.  No chronic cough.  She does have shortness of breath upon exertion.  No nausea or vomiting noted.  She does have occasional diarrhea.  On general exam, she is normocephalic in appearance.  Her pupils are equal, round, reactive to light.  Neck is supple.  No adenopathy. Active range of motion is full.  Chest is  clear to auscultation bilaterally.  No wheezes are noted.  Heart regular rate and rhythm.  No murmurs noted.  Abdomen is soft, nontender to palpation.  Positive bowel sounds were auscultated.  Skin on her thoracolumbar area is clean, dry, and intact.  No rashes noted.  She has pseudoarthrosis on MRI at L2-3 as well as on CT scan.  Our operative plan is to extend her fusion T10 to L3 and to check S1 with posterior iliac crest bone graft harvest by Dr. Nelda Severe.     Lianne Cure, P.A.   ______________________________ Nelda Severe, MD    MC/MEDQ  D:  07/31/2010  T:  08/01/2010  Job:  [id number]  Electronically Signed by Lianne Cure P.A. on 09/04/2010 08:48:12 AM Electronically Signed by Nelda Severe MD on 09/08/2010 05:54:17 PM

## 2010-10-06 NOTE — Cardiovascular Report (Signed)
NAMEMEILING, HENDRIKS NO.:  [id number]   MEDICAL RECORD NO.:  [id number]          PATIENT TYPE:  OIB   LOCATION:  2899                         FACILITY:  [facility]   PHYSICIAN:  [name], M.D.   DATE OF BIRTH:  02-13-1951   DATE OF PROCEDURE:  DATE OF DISCHARGE:  09/26/2008                            CARDIAC CATHETERIZATION   [name] is a 60 year old, thin-appearing, married, native American  female whose husband, [name], is also patient of mine.  She has a history  of coronary artery bypass grafting in 1995 as well as left subclavian  artery PTA and stenting for LIMA steal at that time.   The other problems include hypertension, hyperlipidemia, and GERD.   Gastroenterologist is Dr. Lina [name].  I catheterized her on July 14, 2005, revealing patent grafts, normal LV function.  Left subclavian  stent was patent as well.  She did have a 50% left renal artery stenosis  which we were following by duplex ultrasound.  She suffers from chronic  back pain and has required multiple surgeries by Dr. Jordan Likes.  She was seen  in the ER on February 7 as well as recently on April 30 for chest pain,  rule out MI.  She says the pain was similar to her cardiac pain.  She  presents now for outpatient diagnostic coronary arteriography to define  her anatomy, rule out ischemic etiology.   DESCRIPTION OF PROCEDURE:  The patient was brought to the second floor  [facility] cardiac cath lab in the postabsorptive state.  She was  premedicated with p.o. Valium, IV Versed, and fentanyl.  Her right groin  was prepped and shaved in usual sterile fashion.  Xylocaine 1% was used  for local anesthesia.  A 6-French sheath was inserted into the right  femoral artery using standard Seldinger technique.  A 6-French right and  left Judkins diagnostic catheter as well as 6-French pigtail catheter  were used for selective coronary angiography, left ventriculography,  selective vein graft  angiography, selective left subclavian and IMA  angiography, distal abdominal aortography, and selective right and left  renal artery angiography.  Visipaque dye was used for the entirety of  the case.  Retrograde aortic , left ventricular, and pullback pressures  were recorded.   HEMODYNAMICS:  1. Aortic systolic pressure 140, diastolic pressure 68.  2. Left ventricular systolic pressure 141, end-diastolic pressure 16.   SELECTIVE CORONARY ANGIOGRAPHY:  1. Left main:  Left main had 50-70% ostial stenosis.  2. LAD:  The LAD was diffusely and segmentally diseased in the 70-80%      range, both in the proximal and mid third.  There was competitive      flow distally.  3. Left circumflex:  Occluded proximally.  4. Right coronary:  Dominant vessel with 90% proximal stenosis      followed by occlusion of the genu of the vessel.  There was a      visible stent that was patent in the mid RCA.  5. Vein graft to the PDA and PLA sequentially, widely patent.  6. Vein graft to the diag,  OM1 and OM2 sequentially, widely patent.  7. Sequential LIMA to the diag and mid LAD widely patent with a patent      subclavian stent.   LEFT VENTRICULOGRAPHY:  RAO left ventriculogram was performed using 25  cc of Visipaque dye at 12ccper second.  The overall LVEF was estimated  greater than 60% without focal wall motion abnormalities.   DISTAL ABDOMINAL AORTOGRAPHY:  Distal abdominal aortogram was performed  using 20 cc of Visipaque dye at 20 cc per second.  The right renal  artery appeared to have mild disease.  The left renal artery was  obscured by her back hardware.  A renal abdominal aorta and iliac  bifurcation appear free of significant atherosclerotic changes.   Selective right and left renal artery angiography revealed 30% ostial  right and 40% ostial left renal artery stenosis.   IMPRESSION:  [name] has patent grafts, normal left ventricular  function, and patent subclavian stent.  Anatomy is  unchanged from prior  catheterization 3 years ago.  I think her back and chest pain is  noncardiac.  She will be discharged home later today as an outpatient  and will see me back in 1-2 weeks for followup.      [name], M.D.  Electronically Signed     JB/MEDQ  D:  09/26/2008  T:  09/27/2008  Job:  [id number]   cc:   [facility]  [facility]  Larina Earthly, M.D.

## 2010-10-06 NOTE — Assessment & Plan Note (Signed)
OFFICE VISIT   [name], [name]  DOB:  03-04-1951                                        November 01, 2006  CHART #:  [id number]   The patient is a 60 year old woman who presented earlier this year with  a complaint of an epigastric hernia.  She underwent a repair of that on  08/23/2006.  The Prolene mesh was used.  She did have weakness and  diastasis of the rectus sheath, and there was a small defect in the  subxyphoid area.  She was still having some soreness at that office  visit, which was only about 2 weeks postoperatively, and that  subsequently resolved.  She said then about 3 weeks ago, without any  antecedent trauma or coughing or other event, she started having pain in  that area once again, it was localized to the lower portion of her rib  cage as well as at the surgical incision site.  She says the pain is  there all the time.  She has been taking Tylenol.  She has not felt any  bulging, and she has not been able to take nonsteroidals due to a  history of bleeding ulcers.   PHYSICAL EXAMINATION:  The patient is a well-appearing, 60 year old  female in no acute distress.  Her blood pressure is 106/60, pulse 60,  respirations are 18.  Her oxygen saturation is 98% on room air.  Her  incision is well healed.  There is no evidence of herniation.  There is  some tenderness along the lower costal margins bilaterally, more so  there than at the incision site.   Chest x-ray shows her wires intact, no obvious abnormality.  There is  some calcification in the costal cartilages seen on the lateral view.   IMPRESSION:  The patient is doing well at this point in time.  It is  sort of unusual, and I do not have a good explanation for her pain  without antecedent trauma.  She may have some costochondritis due to  coughing and things of that nature.  I do not feel any hernia, and the  wound is well healed without signs of infection.  I have encouraged her  to  use Tylenol as needed for pain, and give this some time and it should  resolve with time.   Salvatore Decent Dorris Fetch, M.D.  Electronically Signed   SCH/MEDQ  D:  11/01/2006  T:  11/01/2006  Job:  [id number]   cc:   [name], M.D.  Larina Earthly, M.D.

## 2010-10-06 NOTE — Discharge Summary (Signed)
[name], KOPECKY               ACCOUNT NO.:  [id number]   MEDICAL RECORD NO.:  [id number]          PATIENT TYPE:  INP   LOCATION:  3001                         FACILITY:  [facility]   PHYSICIAN:  Kathaleen Maser. Pool, M.D.    DATE OF BIRTH:  1951-05-17   DATE OF ADMISSION:  04/14/2007  DATE OF DISCHARGE:  04/20/2007                               DISCHARGE SUMMARY   SERVICE:  Neurosurgery.   FINAL DIAGNOSES:  L2-3, L3-4, L4-5 and L5-S1 degenerative disease with  stenosis and chronic pain.   HISTORY OF PRESENT ILLNESS:  Ms. Buresh is a 60 year old female with  history of severe back and bilateral extremity pain, worse on the left  side.  The patient is status post previous left-sided L4-5 laminotomy  and microdiskectomy.  She presents now with severe pain, failing all  conservative management.  Workup demonstrates evidence of stenosis at L2-  3, L3-4, L4-5, and L5-S1.  The patient presents now for a four-level  decompression and fusion with instrumentation.   HOSPITAL COURSE:  The patient was taken to the operating room where an  uncomplicated 4-level decompression and fusion with instrumentation was  performed.  Postoperatively, the patient did reasonably well, although  she awakened with some new numbness, paresthesias and weakness in her  right L5 nerve root distribution.  This gradually improved throughout  the patient's hospital course.  The patient had voluntary dorsiflexion,  but it was 3/5 initially postop and 4/5 at time of discharge.  Throughout her hospital course, the patient's wound is healing well.  There is no infection or other problems.  Main difficulty was that of  postoperative pain management.  This was gradually improved with various  oral narcotic medications.  The patient was mobilized with assistance of  physical and occupational therapy.  She was tolerating a regular diet.  At the time of discharge, her overall pain management and activity level  was adequate.   CONDITION ON DISCHARGE:  Improved.   DISCHARGE DISPOSITION:  The patient was discharged home.  She will  follow up my office in 1 week.           ______________________________  Kathaleen Maser. Pool, M.D.     HAP/MEDQ  D:  05/16/2007  T:  05/16/2007  Job:  [id number]

## 2010-10-06 NOTE — Discharge Summary (Signed)
[name], [name]               ACCOUNT NO.:  [id number]   MEDICAL RECORD NO.:  [id number]          PATIENT TYPE:  OBV   LOCATION:  3703                         FACILITY:  [facility]   PHYSICIAN:  [name], M.D.   DATE OF BIRTH:  09/28/1950   DATE OF ADMISSION:  09/20/2008  DATE OF DISCHARGE:  09/21/2008                               DISCHARGE SUMMARY   DISCHARGE DIAGNOSES:  1. Back pain felt to be musculoskeletal.  2. Coronary artery disease with history of bypass in 1995.  Last      catheterization in 2007.  The left internal mammary artery was      patent and the saphenous vein graft was patent to the posterior      descending artery and the diagonal.  3. Peripheral vascular disease with history of stent to the left      subclavian artery.  4. Dyslipidemia.  5. Chronic lower back pain with surgical repair.  6. Gastroesophageal reflux disease.  7. History of chronic obstructive pulmonary disease, asthma.  8. Tobacco use.  9. Sinus bradycardia, unable to use beta-blockers.   DISCHARGE CONDITION:  Improved.   PROCEDURES:  None.   DISCHARGE MEDICATIONS:  1. Albuterol inhaler 2 puffs every 4 hours.  2. Nasonex spray 2 sprays daily.  3. Omeprazole 20 mg daily.  4. Nitroglycerin 0.4 mg sublingual as needed.  5. Propoxyphene/ACT as needed.  6. Singulair 10 mg daily.  7. Spiriva 18 mcg 1 puff daily.  8. Zetia 10 mg daily.  9. Vicodin as before for pain.  10.Nitroglycerin 1/150 as before as needed.   DISCHARGE INSTRUCTIONS:  1. Low-sodium heart-healthy diet.  2. Stop smoking.  3. Follow with Dr. [name] and office will call with date and time.  4. We will schedule you for a monitor to wear a secondary to slow      heart rate.   HISTORY OF PRESENT ILLNESS:  A 60 year old female with complex cardiac  history including bypass grafting, multiple interventions, presented to  [facility] on September 20, 2008 via EMS with complaints of back and  chest pain, has had fatigue and  tiredness for several days.  While  sitting in a picnic table, she experienced severe pain between her  shoulder blades and radiating into her chest straight through from the  back to the front.  She complained of shortness of breath and  diaphoresis with the pain and reported 10/10 on arrival to the emergency  room.  It was worse than she had had in the past, morphine with minimal  relief.  EKG revealed sinus rhythm without acute changes and negative  cardiac markers.  She continued to experience mild discomfort.  She does  have chronic back pain, but this is lower back pain.  Her current pain  on admission was more T3.  No associated symptoms except is already  stated.   PAST MEDICAL HISTORY:  1. Coronary artery disease as stated.  EF 60%.  2. Vascular disease.  3. Dyslipidemia.  4. Chronic back pain.  5. COPD.  6. Continued tobacco use.   Please note  on review of systems, she did complain of increasing fatigue  at home.   ALLERGIES:  PLAVIX.   PHYSICAL EXAMINATION AT DISCHARGE:  Blood pressure 97/40, pulse 51,  respiratory rate is 18, temperature 98.3, oxygen saturation 98% on 3  liters.  Lungs were clear.  Heart sounds S1, S2 and regular rate and  rhythm, does well.   CBC on admission; hemoglobin 13.6, hematocrit 38.3, WBC 8.1, platelets  210.  Basic metabolic panel; sodium 138, potassium 3.5, chloride 103,  CO2 27, glucose 104, BUN 7, creatinine 0.71, calcium 8.7.  LFTs; total  bili 0.6, SGOT 19, alkaline phos 96, SGPT 15, total protein 5.7, albumin  3.4, and calcium 8.4.   Lipids; total cholesterol 181, triglycerides 58, HDL 59, LDL 110.   D-dimer was 0.26.  Cardiac markers were negative, then CK-MB 109 with MB  of 1.5 and troponin I of 0.01 and followup with 78, CK-MB 1.6, troponin  I less than 0.01 and third CK-MB was negative and troponin as well.   TSH 4.202.  Glycohemoglobin 5.4.  PTT was 36 and protime was 14.3 with  an INR of 1.1.   RADIOLOGY:  Emphysema  without acute cardiopulmonary process.   HOSPITAL COURSE:  The patient was brought in for chest pain and back  pain more specifically back pain.  Her cardiac enzymes were negative and  by the next morning, her pain had resolved and she had no complaints.  Dr. Jacinto Halim saw her and felt she was stable to discharge home.  She will  continue her Vicodin at home and follow up with  primary care, but additionally during her time in the hospital, heart  rates were dropped down to 45.  She had complained of increasing fatigue  and we will put on CardioNet monitor to ensure she is not overly  bradycardic which could be aggravating her complaints.  The patient was  discharged home and will follow up.      Darcella Gasman. Annie Paras, N.P.      [name], M.D.  Electronically Signed    LRI/MEDQ  D:  09/23/2008  T:  09/24/2008  Job:  [id number]

## 2010-10-06 NOTE — Op Note (Signed)
NAMEANNALISSE, [name]               ACCOUNT NO.:  [id number]   MEDICAL RECORD NO.:  [id number]          PATIENT TYPE:  INP   LOCATION:  3001                         FACILITY:  [facility]   PHYSICIAN:  Kathaleen Maser. Pool, M.D.    DATE OF BIRTH:  [birth year]   DATE OF PROCEDURE:  04/14/2007  DATE OF DISCHARGE:                               OPERATIVE REPORT   PREOPERATIVE DIAGNOSIS:  L2-3, L3-4, L4-5 and L5-S1  stenosis/degenerative disk disease.   POSTOPERATIVE DIAGNOSIS:  L2-3, L3-4, L4-5 and L5-S1  stenosis/degenerative disk disease.   PROCEDURE NAME:  L4-5 redo laminectomy with L4-L5 foraminotomies.  L2-3,  L3-4 and L5-S1 decompressive laminectomies with foraminotomies, more  than would be required for interbody fusion alone.  L2-3, L3-4, L4-5,  and L5-S1 posterior lumbar interbody fusion utilizing Tangent interbody  allograft wedge, Telamon interbody PEEK cage and local autografting.  L2  through S1 posterolateral arthrodesis utilizing segmental pedicle screw  fixation and local autograft.   SURGEON:  Kathaleen Maser. Pool, M.D.   ASSISTANT:  Tia Alert, M.D.   ANESTHESIA:  General orotracheal.   PREMEDICATION:  [name] is a 60 year old female with history of severe  back and bilateral lower extremity pain, worse on the left.  The patient  is status post previous left-sided L4-5 laminotomy and microdiskectomy.  She presents now with severe pain, failing all conservative management.  Workup demonstrates evidence of severe stenosis at L2-3, L3-4 and L4-5  with some element of recurrent disk herniation, L4-5, on the left side.  She has marked facet arthropathy and stenosis at L5-S1.  We discussed  options of her management including the possibly of moving forward with  a L2-S1 decompression and fusion with instrumentation.  The patient is  aware of the risks and benefits and wishes to proceed.   OPERATIVE NOTE:  The patient was placed on operating table in supine  position.  After adequate  level of anesthesia achieved, the patient  positioned prone onto Wilson frame and appropriate padded.  The  patient's lumbar regions were prepped and draped sterilely.  A 10 blade  was used to make a linear skin incision overlying the L1, 2, 3, 4, 5 and  S1 levels.  This was carried down sharply in the midline. Subperiosteal  dissection was performed utilizing the lamina and facet joints of L2,  L3, L4, L5 and S1 as well as transverse processes of the aforementioned  level and the sacral ala bilaterally.  Deep self-retaining retractor was  placed.  Intraoperative fluoroscopy was used.  Levels were confirmed.  Decompressive laminectomies were then performed using Leksell rongeurs,  Kerrison rongeurs, and high-speed drill to remove the entire lamina of  L2, entire lamina of L3, entire lamina of L4, entire lamina of L5 and  the superior aspect of the lamina of S1.  Previous epidural scar at L4-5  was dissected free and cleared prior to the laminectomy.  Inferior  facetectomies at L2, L3, L4, and L5 were performed bilaterally.  Superior facetectomies at L3, L4, L5 and S1 were performed bilaterally.  All bone was cleaned and used in later  autografting.  Ligament flavum  was then elevated and resected in piecemeal fashion using Kerrison  rongeurs.  Decompressive foraminotomies were then performed along the  course of exiting L2, 3, 4 and 5 and S1 nerve roots bilaterally.  Starting first at L5-S1, thecal sac and nerve roots gently mobilized and  retracted towards the midline starting on the patient's right side.  Epidural venous plexus was coagulated and cut.  Disk space was then  incised with a 15 blade in rectangular fashion.  Wide disk clean out was  then achieved using pituitary rongeurs, upward angled pituitary rongeurs  and Epstein curettes.  All elements of the disk herniation and loose  __________  degenerative disk material was removed from the interspace.  The procedure then repeated on  the contralateral side and then repeated  bilaterally at L4-5, L3-4 and L2-3.  Returning back to L5-S1, disk space  distracted to 8 mm.  An 8-mm distractor was left in the patient's right  side.  Thecal sac nerve root was protected on the left side.  Disk space  was then reamed and then cut with 8-mm Tangent instruments.  Soft  tissues were removed from the interspace.  Disk space further  curettaged.  An 8 x 26-mm Tangent wedge was then impacted into place and  recessed approximately 1 mm from the posterior cortical margin.  Distractor was removed.  The patient's right side thecal sac nerve root  was protected on the right side.  The disk space once again reamed and  then cut with 8-mm Tangent instrument.  Soft tissue was removed from the  interspace.  Morcellized autograft was packed in the interspace.  An 8 x  22-mm Telamon cage was then impacted in the interspace and recessed  approximately 2 mm in the posterior cortical margin.  Interbody fusion  was achieved at L4-5, L3-4 and L2-3 in a similar fashion.  Pedicles at  L2, 3, 4, 5 and S1 were then identified using surface landmarks and  intraoperative fluoroscopy.  Superficial bone around the pedicles was  then removed using high-speed drill.  Pedicles were then probed using  pedicle awl.  Each pedicle awl track was probed and found be solid in  bone.  Each pedicle awl track was then tapped with a 4.25-mm screw tap.  Screw tap hole was probed and found be solid in bone.  The 5.75 x 40-mm  radius screws were placed bilaterally at L2.  The 5.75 x 45-mm screws  were placed bilaterally at L3 and L4.  The 5.75 x 40-mm screws were  placed bilaterally at L5.  The 6.75 x 35-mm screws were placed  bilaterally at S1.  Transverse processes and sacral ala were then  decorticated using a high speed drill.  Morselized autograft was packed  posterolaterally for later fusion.  Titanium rod was then cut, contoured  and placed over the screw heads from  L2-S1.  Locking caps were then  placed over the screw heads.  Locking caps were engaged with construct  under compression.  Transverse connectors were placed.  Final images  revealed good position of bone grafts and hardware and proper operative  level and normal alignment of the spine.  Gelfoam was placed topically  for hemostasis and found to be good.  A medium Hemovac drain was left in  the operative space.  Wounds were then closed in layers with Vicryl  suture.  Steri-Strips and sterile dressing were applied.  There were no  complications.  The patient tolerated  the procedure well, and she  returned to the recovery room for postoperative care.           ______________________________  Kathaleen Maser. Pool, M.D.     HAP/MEDQ  D:  04/14/2007  T:  04/15/2007  Job:  [id number]

## 2010-10-06 NOTE — H&P (Signed)
[name], [name] NO.:  [id number]   MEDICAL RECORD NO.:  [id number]          PATIENT TYPE:  EMS   LOCATION:  MAJO                         FACILITY:  [facility]   PHYSICIAN:  [name], M.D.   DATE OF BIRTH:  Jul 08, 1950   DATE OF ADMISSION:  01/22/2007  DATE OF DISCHARGE:  12/18/2006                              HISTORY & PHYSICAL   CHIEF COMPLAINT:  Shoulder pain.   HISTORY OF PRESENT ILLNESS:  Patient is a 60 year old female with a  history of coronary disease.  She had bypass surgery in 1995.  She has  had multiple subsequent catheterizations.  The last catheterization was  in February of 2007 and at that time she had patent grafts.  She  recently was seen in the office and had a nuclear study March 03, 2007  that was negative for ischemia.  She has normal LV function.  She was  admitted in March of this year with noncardiac pain which was felt to be  emotional related.  She presents now to the emergency room with knife-  like pain across her shoulders.  She said at home, she had some  associated diaphoresis and nausea.  She took nitroglycerin with no  relief.  She presents now for further evaluation.   PAST MEDICAL HISTORY:  Remarkable for:  1. Coronary disease as described above.  2. She also has vascular disease and had a left subclavian artery      angioplasty in 1999 because of jeopardized LIMA circulation.  Her      last Doppler studies were in August of 2007 and these were good.  3. She has had a previous ventral hernia repair after her bypass.  4. She has had a remote hysterectomy, tonsillectomy and carpal tunnel      syndrome surgery.  5. She has had previous back surgery in 1999.  6. She has gastroesophageal reflux and peptic ulcer disease and is      followed by Dr. [name].  7. She has COPD.  8. She has treated dyslipidemia.   CURRENT MEDICATIONS:  Are:  1. Lipitor 81 mg a day.  2. Zetia 10 mg a day.  3. Norvasc 5 mg a day.  4.  Aspirin 81 mg a day.  5. Prilosec 20 mg a day.  6. Singulair 10 mg a day.  7. Spiriva once a day.  8. Albuterol inhaler p.r.n.   SHE IS ALLERGIC TO PLAVIX WHICH CAUSED HIVES.   SOCIAL HISTORY:  She smokes half a pack a day.  She is married with to  children and two grandchildren.   FAMILY HISTORY:  Remarkable for coronary artery disease.  Her father had  an MI in his 96's.  She has two brothers with coronary disease in their  40's.   REVIEW OF SYSTEMS:  Recently, she was treated by Dr. Beaulah Dinning for  bronchitis.  She was given a course of antibiotics and steroids.  She  has also been seen by Dr. [name] and recently had a colon and small-  bowel biopsy for weight loss and chronic diarrhea.  PHYSICAL EXAMINATION:  VITAL SIGNS:  Blood pressure 107/60, pulse 63,  temp 98.8.  GENERAL:  She is a thin Native-American female in no acute distress.  HEENT:  Normocephalic.  Extraocular movements are intact.  Sclerae are  not icteric.  NECK:  Without JVD or bruit.  CHEST:  Reveals few expiratory wheezes.  CARDIAC EXAM:  Reveals regular rate and rhythm without murmur, rub or  gallop.  ABDOMEN:  Nontender.  No hepatosplenomegaly.  No bruits.  EXTREMITIES:  Reveals essentially equal pulses.  No femoral artery  bruits.  NEURO EXAM:  Grossly intact.   EKG shows sinus rhythm without acute changes.   Troponins are negative.  Hemoglobin 13.9, sodium 139, potassium 4.5, BUN  5.   IMPRESSION:  1. Shoulder pain, rule out myocardial infarction.  2. Coronary disease with coronary artery bypass grafting in 1995,      negative nuclear study March 03, 2007.  3. Peripheral vascular disease with left subclavian artery PTA in      1999.  4. Treated dyslipidemia.  5. Gastroesophageal reflux and chronic diarrhea, followed by Dr.      [name].  6. Smoking, COPD, followed by Dr. Beaulah Dinning.  7. PLAVIX ALLERGY.   PLAN:  Patient was seen by Dr. Jacinto Halim in the emergency room.  He  suggested we  consider Ranexa although reviewing her last cath data there  did not appear to be any small-vessel disease.  Will have her follow up  with Dr. [name].      Abelino Derrick, P.A.      [name], M.D.  Electronically Signed    LKK/MEDQ  D:  03/27/2007  T:  03/27/2007  Job:  [id number]

## 2010-10-06 NOTE — Assessment & Plan Note (Signed)
[facility]                         GASTROENTEROLOGY OFFICE NOTE   MECHELE, KITTLESON                      MRN:          [id number]  DATE:03/06/2007                            DOB:          [birth year]    Ms. Riolo is a 60 year old white female, former patient of Dr.  Doreatha Martin  [facility], followed by Dr.  Felipa Eth. We have been treating her for past 2-  1/2 months for diarrhea. She is having 4-5 bowel movements a day with 2-  3 of them at night. Her stool cultures, O&P, C-diff and cultures, WBCs  were all negative. Her tissue transglutaminase level  have been normal.  She has not responded to Flagyl 250 t.i.d. for ten days or Bentyl 10 mg  p.o. t.i.d. She is otherwise feeling well. Denies any abdominal pain,  fever, but she has continued to lose weight. She is now down to 126  pounds. Normal weight being 135 pounds. She denies any excessive stress.   MEDICATIONS:  1. Lipitor 80 mg p.o. daily.  2. Zetia 10 mg p.o. daily.  3. Amlodipine besylate 5 mg p.o. daily.  4. Folic acid.  5. Nitrostat.  6. Aspirin 81 mg p.o. daily.  7. Singulair.  8. Proventil.  9. Spiriva.  10.Nasonex.  11.She propoxyphene/APAP p.r.n. bedtime.  12.Albuterol 90 mg two puffs q4 hours.   PHYSICAL EXAMINATION:  Blood pressure 100/60, pulse 62 and weight 126  pounds. She was alert and oriented in no distress.  Sclerae nonicteric.  SKIN: Warm and dry. No palmar erythema.  LUNGS: Clear to auscultation.  COR: Normal S1, normal S2.  ABDOMEN: Soft with markedly hyperactive bowel sounds in all quadrants.  There was mild tenderness diffusely and with some tympany and moderate  distention. Liver edge at costal margin.  RECTAL: Not repeated.  EXTREMITIES: No edema.   IMPRESSION:  A 60 year old white female with ongoing diarrhea which  occurs during the day as well as nocturnally suggesting organic  etiology. She has not responded to the usual treatments and has so far  negative GI  evaluation.   PLAN:  1. Upper endoscopy with small bowel biopsies.  2. Flexible sigmoidoscopy with colon biopsy to rule out microscopic      colitis.  3. Decrease Prilosec to 200 mg p.o. daily because may cause diarrhea.  4. Trial of Cipro 250 p.o. b.i.d. x10 days.  5. Questran 4 grams daily in a pack with water one hour away from all      other medications.     Hedwig Morton. [name], MD  Electronically Signed    DMB/MedQ  DD: 03/06/2007  DT: 03/06/2007  Job #: [id number]   cc:   Larina Earthly, M.D.

## 2010-10-06 NOTE — Discharge Summary (Signed)
NAMEMARAJADE, [name]               ACCOUNT NO.:  [id number]   MEDICAL RECORD NO.:  [id number]          PATIENT TYPE:  OBV   LOCATION:  2023                         FACILITY:  [facility]   PHYSICIAN:  [name], M.D.   DATE OF BIRTH:  [birth year]   DATE OF ADMISSION:  05/22/2007  DATE OF DISCHARGE:  05/23/2007                               DISCHARGE SUMMARY   DISCHARGE DIAGNOSES:  1. Chest pain, myocardial infarction ruled out.  2. Known coronary disease with bypass surgery in 1995, catheterization      in February 2007 showing patent grafts and negative nuclear study      as an outpatient in October 2008.  3. Fatigue.  4. Anxiety and depression.  5. Treated hypertension.  6. Treated dyslipidemia.  7. Smoking.  8. Esophageal reflux.  9. Peripheral vascular disease with previous left subclavian artery      angioplasty.   HOSPITAL COURSE:  [name] is a 60 year old female well-known to [place]  with history of coronary disease as described above.  She has had a  negative cath in February 2007 and negative nuclear study in October  2008.  She had back surgery in November 2008.  Since then she says she  hasn't been right.  She presented to the emergency room May 22, 2007 with generalized fatigue and some sharp chest pain.  She was  actually evaluated by the emergency room physician and discharged home  but the husband refused to take her.  They asked that we be consulted.  We went ahead and admitted the patient to telemetry to rule out MI.  Initial enzymes were negative.  We have prescribed her antidepressants  in the past as her primary care doctor, but she does not take them.  She  complained of back pain.  She has had back pain since her back surgery.  She is on Dilaudid but ran out at home.  She was admitted to telemetry,  started on Dilaudid p.r.n. for back pain.  Her enzymes were negative.  Dr. Lynnea Ferrier wanted to get an echocardiogram and feels she can be  discharged May 23, 2007.  At discharge, the patient again had  generalized complaints.  I offered to call her primary care doctor to  see if they could see her but she declined.  The husband and the patient  agreed to be discharged and will follow up with Dr. Felipa Eth as an  outpatient.  I set her up to see Dr. Jordan Likes tomorrow on May 24, 2007  at noon for back pain.  She has been given a prescription for 10 2 mg  Dilaudid tablets.  Dr. [name] will see her in follow up and we can follow  up her echocardiogram at that time.  We have encouraged her to  reconsider resuming her antidepressants as that seems to be  contributing.   LABORATORY DATA:  White count 8, hemoglobin 12.5, hematocrit 37.5,  platelets 333.  Sodium 140, potassium 3.8, BUN 1.2, creatinine 0.4.  LFTs were normal.  CK-MB and troponins were negative.  TSH is pending at  discharge.  D-dimer is 0.4.  UA is negative.  INR is 1.  EKG shows sinus  rhythm with nonspecific T-wave inversion in V2.   DISPOSITION:  The patient is discharged in stable condition and will  follow up with Dr. Jordan Likes tomorrow for her back pain.  We have encouraged  her to follow up with her primary care doctor to see if we cannot find a  antidepressant that will suit her.      Abelino Derrick, P.A.      [name], M.D.  Electronically Signed    LKK/MEDQ  D:  05/23/2007  T:  05/23/2007  Job:  [id number]   cc:   [name], M.D.  Larina Earthly, M.D.  Henry A. Pool, M.D.

## 2010-10-06 NOTE — Assessment & Plan Note (Signed)
[name]                         GASTROENTEROLOGY OFFICE NOTE   [name], [name]                      MRN:          [id number]  DATE:12/13/2006                            DOB:          [birth year]    HISTORY OF PRESENT ILLNESS:  [name] is a very nice 60 year old former  patient of Dr. Victorino Dike who is here with acute diarrhea.  She has had  diarrhea off and on now for several months.  She was actually seen as an  acute walk-in by Dr. Russella Dar in February, but the main problem at that  time was incisional hernia which was since then repaired surgically by  Dr. Simona Huh.  The diarrhea occurs postprandially, but also at night.  She  is having 5-6 loose bowel movements a day.  There have not been any  normal stools.  There is occasional blood when she wipes related to  rectal irritation . She denies fever.  Her weight has decreased about 4  pounds from 132 to 128 pounds.  The patient has been tolerating regular  food.  [name] has history of pseudomembranous colitis in 1995.  She  denies taking a recent antibiotic or traveling.  She is an ex-smoker  with coronary artery disease status post coronary artery bypass graft in  1995.  She has COPD, high blood pressure.  Dr. Corinda Gubler has done numerous  upper endoscopies and colonoscopies.  Last colonoscopy on August 2006  showed colon polyps.  Last endoscopy with dilatation, October 2007  showed gastritis.  The patient has been maintained on omeprazole 20 mg  daily.   MEDICATIONS:  1. Lipitor 80 mg p.o. daily.  2. Zetia 10 mg p.o. daily.  3. Amlodipine 5 mg p.o. daily.  4. Multivitamin.  5. Singulair 10 mg p.o. daily.  6. Qvar inhaler.  7. Proventil inhaler.  8. Spiriva spray.  9. Nasonex.  10.Darvocet p.r.n.  11.Omeprazole 200 mg p.o. q.i.d.  12.Albuterol 90 mg two puffs q.4 h.   PHYSICAL EXAMINATION:  Blood pressure 120/74, pulse 64, weight 128  pounds.  She appeared overall suntanned, in no  distress.  Sclerae nonicteric.  NECK:  Supple.  No adenopathy.  LUNGS:  Clear to auscultation.  COR:  With post sternotomyscar, well-healed, and recent repair of the  ventral hernia.  Normal S1, S2.  ABDOMEN:  Soft, but diffusely tender in all quadrants, mostly in the  left lower quadrant.  There was no distention. Bowel sounds were  somewhat hypoactive.  Left upper quadrant and right upper quadrant did  not show any palpable mass.  RECTAL:  Stool soft, hemoccult negative.   IMPRESSION:  A 60 year old white female with subacute diarrheal illness  who had infectious colitis who had inflammatory bowel disease.  Last  colonoscopy one and a half years ago.  Did not show evidence of IBD.  We  need to think of possibility of Crohn's pseudomembranous colitis,  although, there is no clear history of her taking antibiotics.  1. 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  The nocturnal diarrhea usually does not  occur in      IBS, and that is why I am suspecting there may be some organic      problem.  Other course of diarrhea would be sprue or      hyperthyroidism.  Get a TSH too.   PLAN:  1. Stool for O&P, WBC, culture, and C. difficile toxin.  2. CBC, sed rate, tissue transglutaminase and TSH.  3. Flagyl 250 mg p.o. q.i.d. for 10 days with one refill by E.      prescribing.  4. Bentyl 10 mg p.o. t.i.d. a.c., dispensed 90, also by E.      prescribing.  5. I will see her again in six weeks.     Hedwig Morton. [name], MD  Electronically Signed    DMB/MedQ  DD: 12/13/2006  DT: 12/13/2006  Job #: [id number]   cc:   Larina Earthly, M.D.

## 2010-10-09 NOTE — Op Note (Signed)
NAMELARRY, [name]               ACCOUNT NO.:  [id number]   MEDICAL RECORD NO.:  [id number]          PATIENT TYPE:  INP   LOCATION:  2899                         FACILITY:  [facility]   PHYSICIAN:  Salvatore Decent. Dorris Fetch, M.D.DATE OF BIRTH:  1950/06/16   DATE OF PROCEDURE:  08/23/2006  DATE OF DISCHARGE:  08/23/2006                               OPERATIVE REPORT   PREOPERATIVE DIAGNOSIS:  Epigastric hernia.   POSTOPERATIVE DIAGNOSIS:  Epigastric hernia.   PROCEDURE:  Repair of epigastric hernia with Prolene mesh.   SURGEON:  Salvatore Decent. Dorris Fetch, M.D.   ASSISTANT:  General.   FINDINGS:  Small defect in the subxiphoid location, no clearly defined  edges, diastasis of the rectus sheath.   CLINICAL NOTE:  [name] is a 60 year old female with a history of  coronary bypass grafting in the past by Dr. Particia Lather. She recently  has been having difficulty with pain in the epigastric area.  She has  been through an extensive workup with no identifiable cause.  Recently,  the patient has been experiencing the sensation that her intestine is  getting caught just below the bottom of her breast bone. She feels a  lump there which then will subsequently subside with relief of her  symptoms.  On examination, there was no clearly defined hernia defect.  The patient was advised that exploration of the area was possible or she  could be managed conservatively.  She strongly wished to proceed with  exploration with a plan to repair any defect with mesh. The indications,  risks, benefits and alternatives were discussed in detail with the  patient.  She did see Dr. [name] and have cardiac clearance  prior to the procedure.   OPERATIVE NOTE:  [name] was brought the preop holding area on August 23, 2006.  There, intravenous access was obtained by anesthesia.  Intravenous antibiotics were administered.  The patient had PAS hose  placed for DVT prophylaxis.  She was taken to the  operating room,  anesthetized, and intubated without incident.  The chest and abdomen  were prepped and draped in the usual sterile fashion.   An incision was made over the subxiphoid area and carried onto the upper  epigastrium.  It was carried through the skin and subcutaneous tissue.  The tissue overlying the rectus sheath was elevated.  There was a small  defect without clearly defined edges just below the xiphoid. There were  no herniated contents present.  There was thinning of the fascia over  the anterior/superior portion of the rectus muscle bilaterally. The  relatively small defect could not be closed primarily. The area then was  covered with a polypropylene mesh and the smaller defect was sutured in  the midline with running 3-0 Prolene suture incorporating the  polypropylene mesh. The outer edges of the polypropylene mesh then were  sutured circumferentially to the rectus fascia inferiorly and laterally  and to the xiphoid and costal margin superiorly, reinforcing the entire  area.  Meticulous hemostasis was achieved.  The wound was copiously  irrigated with warm saline. The incision was closed  in three layers with  a running #1 Vicryl suture in  the deep subcutaneous tissue, running 2-0 Vicryl sutures superficially,  and a 3-0 Vicryl subcuticular suture.  All sponge, needle and instrument  counts were correct at the end of the procedure.  The patient was taken  from the operating room to the post anesthesia care unit extubated and  in good condition.      Salvatore Decent Dorris Fetch, M.D.  Electronically Signed     SCH/MEDQ  D:  08/23/2006  T:  08/23/2006  Job:  [id number]

## 2010-10-09 NOTE — Cardiovascular Report (Signed)
[place]. [facility]  Patient:    MYLEIGH, AMARA Visit Number: [id number] MRN: [id number]          Service Type: MED Location: [phone] Attending Physician:  Berry, Jonathan [place] Dictated by:   Lennette Bihari, M.D. Proc. Date: 09/25/01 Admit Date:  09/22/2001 Discharge Date: 09/26/2001                          Cardiac Catheterization  INDICATIONS:  Ms. Liberta Gimpel is a pleasant 60 year old female with known coronary artery disease.  She had suffered a DMI in 1995.  She is status post CBG revascularization surgery, with a LIMA to the LAD and second diagonal sequential vein to the first diagonal, OM1 circumflex sequential vein to the RCA PDA and PLA vessels.  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  Her last coronary intervention was done on the native right coronary artery proximal; to have been totally occluded due to high-grade stenosis proximal to an acute marginal, jeopardizing flow in this region.  The patient has had recurrent episodes of chest pain, leading to her hospitalization with possible unstable angina.  She is now referred for repeat catheterization.  PROCEDURAL DETAILS:  Left heart catheterization was done via the right femoral artery utilizing 6-French Judkins 4 left and right coronary catheters.  A 6-French pigtail catheter was used for biplane cine left ventriculography, as well as distal aortography.  Selective angiography was also done in the left subclavian artery, because of the patients mild bruit in the right subclavian region; this was done as well.  Hemostasis was achieved with direct manual pressure.  HEMODYNAMIC DATA: 1. Central aortic pressure:  145/65. 2. Left ventricular pressure:  145/06. Post a-wave 27.  ANGIOGRAPHIC RESULTS: 1. LEFT MAIN CORONARY ARTERY:  Had ostial disease of 70-80%, and bifurcated    into LAD and left  circumflex system. 2. LEFT ANTERIOR DESCENDING CORONARY ARTERY:  The proximal LAD had 95%    stenoses, and a region of several septal perforator arteries. A flushing    fill was seen distally, and also a diagonal noted competitive flow. 3. LEFT CIRCUMFLEX CORONARY ARTERY:  The native proximal circumflex was    totally occluded. 4. RIGHT CORONARY ARTERY:  Had calcium ostially, with narrowing of 20%    approximately.  There was a widely patent stent in the proximal to mid    RCA.  There was 50% in the area of narrowing just proximal to the acute    marginal branch, and then there was 70% stenosis distal to this acute    marginal branch, prior to the vessel being totally occluded. 5. GRAFTS:    a. The vein graft supplying the right coronary artery was widely patent.       There was no significant distal disease beyond the graft anastomosis.    b. The vein graft sequentially supplying the diagonal circumflex marginal       and distal circumflex vessel was widely patent.    c. The LIMA to the LAD was widely patent.  SELECTIVE ANGIOGRAPHY:  Done into the left subclavian showed a widely patent stent, without evidence for in-stent restenosis.  There was no evidence for significant narrowing in the left subclavian region.  LEFT VENTRICULOGRAPHY:  Biplane cine left ventriculography revealed normal global LV contractility, with focal inferior and posterolateral contractility.  AORTOGRAPHY:  Distal aortography showed  no significant renal artery narrowings.  There was mild 20% narrowing in the infrarenal aorta.  IMPRESSION: 1. Normal global LV function, with mild inferior and posterolateral    hypocontractility. 2. Severe native coronary artery obstructive disease, with 70-80% ostial    left main stenosis, 95% proximal LAD stenosis, a total occlusion of    the proximal circumflex, and total occlusion of the distal right coronary    with a widely patent stent in the proximal to mid right coronary  artery    (with narrowings of 50% just proximal to the acute margin takeoff). 3. Patent vein graft supplying the right coronary artery PD/PLA system. 4. Patent sequential vein graft to the diagonal one obtuse marginal and    distal circumflex. 5. Patent LIMA supplying the LAD diagonal two distribution. 6. Widely patent stent in the left subclavian artery, without any potential    for jeopardized flow in the LIMA to LAD system. 7. Mild infrarenal aortic narrowing of approximately 20%.  RECOMMENDATION:  Medical therapy.  Due to the patients chronic angina, she may be a good candidate for ECP therapy. Dictated by:   Lennette Bihari, M.D. Attending Physician:  Berry, Jonathan [place] DD:  09/25/01 TD:  09/26/01 Job: [id number]

## 2010-10-09 NOTE — H&P (Signed)
[facility]  Patient:    [name], [name] Visit Number: [id number] MRN: [id number]          Service Type: PMG Location: TPC Attending Physician:  Rolly Salter Dictated by:   Jewel Baize. Stevphen Rochester, M.D. Admit Date:  03/06/2001   CC:         Julio Sicks, M.D.   History and Physical  REASON FOR CONSULTATION:  [name] comes to center pain management today as a kind referral from Julio Sicks, M.D. We are consulted for consideration of lumbar epidural injection.  HISTORY OF PRESENT ILLNESS:  She is a pleasant, 60 year old complaining of pain in the paralumbar region with left radicular component relating her pain as an 8/10 on a subjective scale; dull, achy and throbbing. It is extends in L5-S1 and L4-5 distribution. I reviewed the MRI and imaging which reveals degenerative components at multiple levels. She has apparently had surgical intervention at the lower lumbar segments and has had good relief cycling but is breaking through. She relates her pain as progressive for a number of months with declining restorative sleep capacity. It does improve with medications, made worse with walking, bending, and sitting. She relates no antecedent injury.  MEDICATIONS:  Altace, albuterol inhalers, Singular, amoxicillin, Atrovent, Prilosec, Darvocet, Estratest, Nasonex, Zocor, nitroglycerin p.r.n.  ALLERGIES:  She has no known drug allergies.  REVIEW OF SYSTEMS:  States no wish to harm self or others. Fourteen-point review of systems, health and history form reviewed.  PAST MEDICAL HISTORY:  Four MIs, first in 1995. She has had six stent placements, CABG with angioplasty four months ago but is currently asymptomatic. She is not anticoagulated.  PAST SURGICAL HISTORY:  Coronary artery bypass in 1995, breast biopsies that are negative, and a hysterectomy.  FAMILY HISTORY:  Remarkable for lung disease, cancer, diabetes, hypertension, and heart disease. Many  members of her family have heart disease.  SOCIAL HISTORY:  She is a previous smoker at 50-pack year, has quit. She is currently not working and was in [employer]. She is married. Does not drink alcohol.  REVIEW OF SYSTEMS, FAMILY AND SOCIAL HISTORY:  Otherwise noncontributory to the pain problem.  PHYSICAL EXAMINATION:  GENERAL:  A pleasant female sitting comfortably in bed. Gait, affect, appearance is normal. Oriented x 3.  HEENT:  Unremarkable.  CHEST:  Clear to auscultation and percussion.  HEART:  She has a regular rate and rhythm without murmur, gallop, or rub.  ABDOMEN:  Soft, nontender, benign. No hepatosplenomegaly.  MUSCULOSKELETAL:  She has diffuse paralumbar myofascial discomfort, impaired flexion, extension lateral rotational pain.  NEUROLOGICAL:  ______ and Luisa Hart equivocal. Straight leg lift impaired, left greater than right. No other overt neurological deficit in motor or sensory reflexes.  IMPRESSION:  ______ spinal disease, lumbar spine.  PLAN: 1. Transforaminal epidural injection or possibly translaminar injection in    combination, directed leftward to most symptomatic side. We will see her in    follow-up after she completes her amoxicillin course, and she does not have    a driver today. She is instructed to bring a driver to follow up with [place] in    one week. 2. We will provide analgesic capacity with Norco at least for a short period    of time with full intent to move to non-narcotic medication alternatives    over time as we hopefully have improvement in response to the injection    cycling. Neurontin will also be used for radicular component and trazodone  for restorative sleep capacity.  Full disclosure. Risks, complications, and options fully outlined. Instructed to maintain contact with primary care and Julio Sicks, M.D. Dictated by:   Jewel Baize Stevphen Rochester, M.D. Attending Physician:  Rolly Salter DD:  03/07/01 TD:  03/07/01 Job:  [id number]

## 2010-10-09 NOTE — Assessment & Plan Note (Signed)
[facility]                           GASTROENTEROLOGY OFFICE NOTE   JENNET, SCROGGIN                      MRN:          [id number]  DATE:03/16/2006                            DOB:          [birth year]    [name] comes in, says she is having some dysphagia with acid reflux.  No  vomiting, some trouble swallowing.  Also states everything I eat gives me  diarrhea, been going on for 3 weeks.   PHYSICAL EXAMINATION:  She looks great as always.  Weighs 130.  Blood  pressure 100/58.  Pulse 68 and regular.  Oropharynx was negative.  NECK:  Was negative.  CHEST:  Was clear.  HEART:  Regular rhythm.  ABDOMEN:  Soft, no mass or organomegaly.  No bruits or rubs.  RECTAL:  Deferred.  EXTREMITIES:  Unremarkable.   IMPRESSION:  1. Gastroesophageal reflux disease with probably stricture requiring      dilatation.  I asked her whether she thought we ought to do this and      see what more she felt that was indicated.  2. Had recent increasing diarrhea of questionable etiology.  3. History of asthma.  4. History of anxiety/depression.  5. History of peptic ulcer disease.  6. 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  7. Status post colon polyps.   RECOMMENDATIONS:  To get an EGD with dilatation.  I put her on some Xifaxan,  I gave her samples, two 200 mg tablets daily and probiotics and we will see  how she responds to the above.  In the meantime, we will schedule for her  the endo with dilatation and we will be able to see how she responds after  we do the procedure and treat her more specifically.            ______________________________  Ulyess Mort, MD      SML/MedQ  DD:  03/16/2006  DT:  03/16/2006  Job #:  [id number]

## 2010-10-09 NOTE — Assessment & Plan Note (Signed)
[facility]                         GASTROENTEROLOGY OFFICE NOTE   ASTORIA, CONDON                      MRN:          [id number]  DATE:07/19/2006                            DOB:          [birth year]    [name] is seen today in Dr. Richarda Osmond absence for recurrent  problems with intermittent bulging in her epigastric area associated  with significant pain. She is status post CABG and has noticed the bulge  at the inferior aspect of her incision over the xiphoid process and  epigastric area. She is able to reduce the bulge, but it does cause  significant pain and cramping in her abdomen and then she will  frequently have a small amount of diarrhea after this. She has no other  gastrointestinal complaints today.   CURRENT MEDICATIONS:  As listed on the chart - updated and reviewed.   MEDICATION ALLERGIES:  PLAVIX.   PHYSICAL EXAMINATION:  GENERAL:  In no acute distress.  VITAL SIGNS:  Weight 132.2 pounds, blood pressure 124/78, pulse 60 and  regular.  CHEST:  Clear to auscultation bilaterally.  CARDIAC:  Regular rate and rhythm without murmurs appreciated. There is  a small defect at the inferior aspect of the incision.  ABDOMEN:  Soft, nontender, nondistended, normoactive bowel sounds. No  palpable organomegaly or masses or hernias.   ASSESSMENT/PLAN:  Suspected small incisional hernia at the inferior  aspect of her median sternotomy. She will return to [facility] for further  evaluation and management.     Venita Lick. Russella Dar, MD, [facility]  Electronically Signed    MTS/MedQ  DD: 07/19/2006  DT: 07/19/2006  Job #: [id number]

## 2010-10-09 NOTE — Discharge Summary (Signed)
[place]. [facility]  Patient:    [name], [name]                      MRN: [id number] Adm. Date:  [id number] Disc. Date: [id number] Attending:  Virgina Evener CC:         Runell Gess, M.D.                           Discharge Summary  ADMISSION DIAGNOSES: 1. Unstable angina, rule out myocardial infarction. 2. Peripheral vascular disease, status post left subclavian stent. 3. Hyperlipidemia. 4. Asthma.  DISCHARGE DIAGNOSES: 1. Unstable angina, status post percutaneous transluminal coronary angioplasty    stent to right coronary artery. 2. Atherosclerotic peripheral vascular disease, status post left subclavian    artery stent insertion. 3. Hyperlipidemia. 4. Asthma.  DISCHARGE MEDICATIONS: 1. Enteric-coated aspirin 325 mg q.d. p.o. 2. Plavix 75 mg q.d. p.o. 3. Altace 205 mg q.d. p.o. 4. Zocor 80 mg q.d. p.o. 5. Singulair 10 mg q.d. p.o. 6. Albuterol two puffs p.r.n. 7. Atrovent inhaler two puffs p.r.n.  PROCEDURE:  Cardiac catheterization with PTCA and stent to proximal RCA performed on April 7, by [name], M.D.  COMPLICATIONS:  None.  CONSULTING PHYSICIANS:  None.  HOSPITAL COURSE:  The patient was admitted on August 28, 2000, with complaints of recurrent chest pain that was not elevated by Nitroderm patch and increased shortness of breath.  The patient has a long history of coronary artery disease and she underwent a coronary artery bypass graft in 1995 (LIMA to LAD, SVG to obtuse marginal, SVG to PDA).  A recent catheterization was performed in September of 2000 which showed patent saphenuos vein graft and no intervention required at that time.  On admission her enzymes were CK 115, CK-MB 16, and troponin less than 0.01.  CBC showed white blood cells 7.1, hemoglobin 13.7, hematocrit 39.5, and platelets 281.  The patient was started on nitroglycerin, Plavix, and heparin.   The next day, April 8, Dr. Tresa Endo performed cardiac  catheterization and it showed a high grade lesion in native RCA 95% stenosis jeopardizing large area.  PTCA was performed and stent placed to proximal native RCA with reduction of stenotic area from 95% to 0%.  The patient tolerated the procedure well.  She was given a bolus and drip of Integrilin IV and transferred to the unit in stable condition.  The next day she was ready to discharge home and her physical examination on discharge day was benign.  The patient was afebrile. Heart rate was 58, respirations 20, blood pressure 102/60, and saturations O2 99% on 2 liters.  The chest was clear to auscultation bilaterally and heart revealed a regular rate and rhythm.  Abdomen was benign.  No lower extremity edema with intact peripheral pulses.  At the time of discharge, the metabolic panel revealed a sodium 140, potassium 3.1, chloride 108, CO2 28, glucose 113, BUN 9, creatinine 0.6, and calcium 8.5.  The patient was given 40 mEq of potassium chloride to replete hypokalemia.  EKG showed sinus rhythm with marked bradycardia, but the patient stayed asymptomatic.  Her cardiac enzymes stayed negative throughout the hospital stay.  She was discharged home in stable condition.  DISCHARGE INSTRUCTIONS:  Avoid driving or any strenuous physical activity the first 72 hours after the procedure.  Call our office if any signs of bleeding, oozing, redness, or swelling observed.  Discharge diet low  cholesterol, low fat, low salt diet.  FOLLOW-UP:  A follow-up appointment was scheduled with Dr. [name] on Sep 21, 2000, at 10 a.m.  The full cardiac report dictated by Dr. Tresa Endo follows. DD:  08/30/00 TD:  08/30/00 Job: [id number]

## 2010-10-09 NOTE — Discharge Summary (Signed)
[place]  Patient:    [name], [name] Visit Number: [id number] MRN: [id number]          Service Type: MED Location: 2000 2014 01 Attending Physician:  Berry, Jonathan [place] Dictated by:   Raymon Mutton, P.A. Admit Date:  02/07/2001 Discharge Date: 02/08/2001                             Discharge Summary  DATE OF BIRTH:  [birth year].  DISCHARGE DIAGNOSES: 1. Coronary artery disease, status post coronary artery bypass grafting and    multiple percutaneous coronary interventions. 2. Gastroesophageal reflux disease. 3. Hypertension. 4. Hyperlipidemia.  DISCHARGE MEDICATIONS: 1. Aspirin 325 mg daily. 2. Altace 2.5 mg daily. 3. Zocor 80 mg daily. 4. Singulair 10 mg daily. 5. Albuterol and Atrovent, resume previous doses. 6. Estratest one pill daily. 7. Prilosec 20 mg two pills daily.  HOSPITAL PROCEDURES:  On February 08, 2001, the patient underwent coronary angiography performed by Runell Gess, M.D.  The procedure revealed left ventricular systolic function with ejection fraction 60% without wall motion abnormalities, 70% stenosis of the left main artery, 90% stenosis of the LAD in the proximal portion, and second diagonal branch had 95% stenosis.  His circumflex artery was totalled proximally, right coronary artery totalled in the mid portion after the takeoff of marginal branch.  Proximal RCA had 40 to 50% segmental stenosis. Vein graft remained widely patent to the diagonal ramus and obtuse marginal branch and to the distal RCA and PLA.  LIMA to the LAD was widely patent as well as patent was subclavian stent.  HOSPITAL COMPLICATIONS:  None.  HOSPITAL CONSULTS:  None.  HOSPITAL COURSE:  A 60 year old [place] female with history of coronary artery disease and status post CABG in 1995 who was admitted to the hospital after she developed an episode of one week history of recurrent chest pain and pressure which was  nitrate responsive.  She underwent cardiac catheterization in April of 2002, and at that time she had stent placed to the mid right coronary artery. This was performed by Dr. Nicholaus Bloom.  At that time, during coronary angiography, grafts were patent. Cardiac enzymes were negative with CK 86, CK-MB 0.6 and troponin 0.02 first set and CK 71, CK-MB 0.5 and troponin 0.01 second set; CK 73, CK-MB 0.4 and troponin less than 0.01 third set.  Her EKG showed sinus bradycardia with 62 beats per minute and T-wave inversion in lead aVL and this was old.  No new acute changes are noted on EKG.  She was admitted to rule out early in-stent restenosis and underwent coronary angiography on February 08, 2001, with results outlined above.  Dr. [name] started treating her empirically with reflux and planned to obtained a gastroenterology evaluation and possible esophagogastroduodenoscopy.  Later that evening I was paged to come and evaluate the patient because she was very determined to be discharged without being evaluated by gastroenterologist.  She said that she underwent EGD and colonoscopy approximately three months ago which was done by Ulyess Mort, M.D.  She was found to have hiatal hernia and gastroesophageal reflux disease and esophageal stricture which was dilated during the procedure.  At the time of evaluation, she was pain-free and free of shortness of breath. Her groin was stable and did not show any signs of hematoma, external bleeding, or oozing.  I can talk to Dr. [name] and obtain his approval for her discharge  home and discharge the patient later on February 08, 2001, with plan to have follow-up with Dr. [name] on outpatient basis on February 28, 2001, at 2:40 p.m.  LABORATORY:  White blood cell count 6.1, hemoglobin 14.1, hematocrit 40.1, platelets 235.  Potassium 141, sodium 3.6, chloride 1.8, CO2 27, glucose 98, BUN 7, creatinine 0.7.  Calcium 9.6.  Liver function test was within  normal range.  DISPOSITION:  The patient was discharged home with instructions to avoid driving or any strenuous vigorous physical activity for 72 hours.  She was advised to call our office should she develop any problems like increased swelling or pain, bleeding or oozing from the groin site.  DISCHARGE FOLLOW-UP:  Appointment with Dr. [name] on February 28, 2001, at 2:40 p.m.  DISCHARGE DIET:  Low fat, low cholesterol, low sodium diet. Dictated by:   Raymon Mutton, P.A. Attending Physician:  Berry, Jonathan [place] DD:  02/23/01 TD:  02/24/01 Job: [id number]

## 2010-10-09 NOTE — Discharge Summary (Signed)
NAME:  [name], [name]                         ACCOUNT NO.:  [id number]   MEDICAL RECORD NO.:  [id number]                   PATIENT TYPE:  INP   LOCATION:  3714                                 FACILITY:  [facility]   PHYSICIAN:  Runell Gess, M.D.             DATE OF BIRTH:  16-Jan-1951   DATE OF ADMISSION:  01/16/2002  DATE OF DISCHARGE:  01/19/2002                                 DISCHARGE SUMMARY   ADMISSION DIAGNOSES:  1. Unstable angina.  2. Coronary artery disease; a)  Status post CABG times seven vessels in     1995; b)  Status post multiple percutaneous interventions in the past.     Last catheterization Sep 25, 2001 with plans for medical therapy and EECP.  3. Hypertension.  4. Hyperlipidemia.  5. Chronic obstructive pulmonary disease/asthma.  6. Peripheral vascular disease status post left subclavian stenting in the     past.  7. Remote tobacco use.   DISCHARGE DIAGNOSES:  1. Unstable angina.  2. Coronary artery disease; a)  Status post CABG times seven vessels in     1995; b)  Status post multiple percutaneous interventions in the past.     Last catheterization Sep 25, 2001 with plans for medical therapy and EECP.  3. Hypertension.  4. Hyperlipidemia.  5. Chronic obstructive pulmonary disease/asthma.  6. Peripheral vascular disease status post left subclavian stenting in the     past.  7. Remote tobacco use.  8. Status post cardiac catheterization on January 17, 2002 revealing patent     bypass grafts.  No significant change from last catheterization and plans     for continued medical therapy.   HISTORY OF PRESENT ILLNESS:  The patient is a 60 year-old female who  presented to the emergency room on January 16, 2002 with complaints of chest  pain.  At about 12:30 p.m. that day she had onset of anterior chest pain  that went to her left arm. At that time she was walking in her house.  She  did have shortness of breath, nausea and diaphoresis associated with it. She  took three sublingual nitroglycerin five minutes apart with minimal relief  but then the pain returned.  She came to the emergency room at about 4 p.m.  with her husband. She described the pain as a heaviness and pressure, 8/10.  She was given IV nitroglycerin in the emergency room and it was still an  8/10.  She was given 2 mg IV of morphine and it came to a 7/10.  She also  said that she had had an increasing amount of tiredness and fatigue with  minimal physical activity recently.  Her last anginal episode was about  three weeks ago and she was admitted at that time and ruled out with enzymes  and discharged home.  She had been in E[facility]P prior to that, but did miss the  last week of  the EECP therapy because her husband had been in the hospital  at that time.   On exam at that time there were no significant physical abnormalities.  She  had a heart rate of 79 and blood pressure 103/55.  The first set of enzymes  were negative.  EKG showed no significant change from January 04, 2002.   At this time she was on IV heparin and IV nitroglycerin.  We would plan to  admit her, check serial enzymes and rule out MI.  We would have to review  films and the case with Dr. [name] given her recurrent chest pain and  frequent readmissions with chest pain.  She had just been hospitalized  several weeks ago and discharged home with no repeat catheterization.  It  was felt that we would decide in the morning whether repeat catheterization  was necessary.   HOSPITAL COURSE:  On January 17, 2002 the patient continued to complain of  continuing chest pain.  Enzymes remained negative and EKG remained without  significant change.  Dr. Alanda Amass discussed the risks and benefits of the  cardiac catheterization with the patient and she was willing to proceed.   On January 17, 2002 the patient underwent cardiac catheterization by Dr.  Susa Griffins.  She was found to have:  A patent LIMA to LAD, patent SVG  to  diagonal OM and OM, patent SVG  to RCA, patent RCA stent, patent left  subclavian artery stent.  It was felt that she had no significant unbypassed  disease. Dr. Alanda Amass felt that we need to continue medical therapy and  gave the patient reassurance. As well, he felt that she needed follow-up  with GI and as well she needed a psychiatry consult.  The ejection fraction  was greater than 50%.  There seemed to be psychosocial overlay and this is  the reason for feeling that a psychiatric evaluation was necessary.   On January 18, 2002 we obtained a GI consult and planned to obtain a  psychiatric consult.   On January 18, 2002 she was evaluated by Dr. [name] from GI.  It was felt  that she had chronic chest pain both exertional and at rest.  She had a past  history of GERD.  As well, she had a history of colon polyps and colonoscopy  in June 2002.  As well, she had esophageal dyskinesia.  At that time, Dr.  [name] recommended a barium swallow with cine esophagogram as an outpatient.  She should also try a trial of Flexeril 10 mg t.i.d.   On January 19, 2002 we had originally been planning to await psychiatric  consult. However, at this time the patient refused to wait for psychiatric  evaluation and refused their consultation.  Therefore, at that time we  planned for discharge home.  She had remained medically stable.  She was  hemodynamically stable with a blood pressure of 97/44 and a heart rate of  48.  Her medications were actually adjusted because of this low blood  pressure.  Norvasc and Imdur were stopped. At this time she was seen and  evaluated by Dr. Lenise Herald who deemed her stable for discharge home  prior to psychiatric consultation.  As well, we reviewed with her the  possible need for antidepressants and she did not want any antidepressants.   HOSPITAL PROCEDURES:  1. Cardiac catheterization on January 17, 2002 by Dr. Susa Griffins.    Please see dictated report as it  is quite  lengthy.  She was found to     have:  a)  Mid RCA stent to protect the obtuse marginal branch which was     widely patent; b)  Mild segmental wall motion abnormality with a well     preserved left ventricular function with an ejection fraction greater     than 50%; c)  Widely patent left subclavian stent; d)  Widely patent     graft unchanged from previous multiple catheterizations the last of which     was May 5; e)  Patent LIMA to LAD, patent SVG to diagonal and OM, patent     SVG to RCA.  As well, she had no significant native vessel disease distal     to the anastomoses sites.  Dr. Alanda Amass gave her reassurance and planned     to continue medical therapy.  He planned a GI consult and psychiatric     consult for significant depression and psychosocial overlay.   HOSPITAL CONSULTS:  1. Gastrointestinal consultation by Dr. [name] on January 18, 2002 which     was for evaluation of her ongoing chronic chest pain.  It was felt that     she had multifactorial chest pain which was chronic.  She planned for a     barium swallow with cine esophagram as an outpatient.  She planned for a     trial of Flexeril 10 mg t.i.d.  It was noted that the patient had a     history of GERD.  As well, she has had a history of colon polyps at     colonoscopy in June 2002.  As well, she had esophageal dyskinesia and     also had some dysphagia secondary to the esophageal dyskinesis.  2. Psychiatric consultation had been called, but the patient refused this     evaluation and wanted to be discharged home without psychiatric     consultation.  As well, she refused to take antidepressants as they were     offered to her at the time of discharge.   LABORATORY DATA:  Cholesterol 243, triglycerides 60, HDL 70, LDL 161.  Cardiac enzymes showed CK's of 83, 72 and 74; MB's of 1.3, 1.3 and 1.1;  Troponin 0.03 and 0.02.  Sodium 141, potassium 3.8, chloride 106, glucose  78, BUN 3, creatinine 0.6.  Liver  function tests were normal.  PT 13.2.  INR  1.0. PTT was elevated on IV heparin.  Admission white count 9.1, hemoglobin  13.6, hematocrit 39.7 and platelets 302,000; these parameters remained  stable with no significant variation during the hospitalization.   RADIOLOGY:  Chest x-ray showed no active disease.   EKG showed normal sinus rhythm.  There was some T wave inversion in AVL and  V2 but this was not significantly changed from her EKG on January 04, 2002.   DISCHARGE MEDICATIONS:  1. Altace 2.5 mg once a day.  2. Foltx two tablets a day.  3. Aspirin 325 mg a day.  4. Singulair 10 mg once a day.  5. Estratest once a day.  6. Lipitor 80 mg once a day.  7. Prilosec 20 mg increased to twice a day for the next one or two weeks and     then decrease back to once a day.  8. Albuterol and Atrovent inhalers as before.  9. Nitroglycerin sublingual as needed.  10.      Flexeril 10 mg three times a day per Dr. [name].  ACTIVITY:  No strenuous activity, lifting greater than five pounds, driving  or sexual activity for three days.   DIET:  Low salt, low fat, low cholesterol, low spice diet.   WOUND CARE:  May gently wash groin site with warm water and soap.   SPECIAL INSTRUCTIONS:  Call [phone] if any bruising, bleeding or increased  pain at groin site.   FOLLOW UP:  1. Keep appointment with Dr. [name] for September 4.  2. Call Dr. Regino Schultze office and make an appointment to see her after     September 2.  She was scheduled for a swallowing study at [facility] on Tuesday, September 2 at 10:45 a.m.  She will need to     follow-up with Dr. [name] after this.   She is to stop taking Norvasc and Imdur.   Again, I have offered her a prescription for an antidepressant and she  refused.  As well, she refused psychiatric consultation prior to her  discharge home.       Mary B. Remer Macho, P.A.-C.                   Runell Gess, M.D.    MBE/MEDQ  D:  01/31/2002   T:  02/01/2002  Job:  [id number]   cc:   Hedwig Morton. [name], M.D. [facility]

## 2010-10-09 NOTE — Discharge Summary (Signed)
NAMESHAENA, [name]               ACCOUNT NO.:  [id number]   MEDICAL RECORD NO.:  [id number]          PATIENT TYPE:  INP   LOCATION:  2009                         FACILITY:  [facility]   PHYSICIAN:  [name], M.D.   DATE OF BIRTH:  05-28-1950   DATE OF ADMISSION:  07/22/2006  DATE OF DISCHARGE:  07/24/2006                               DISCHARGE SUMMARY   DISCHARGE DIAGNOSES:  1. Chest pain, myocardial infarction ruled out.  2. Palpitations.  3. Known coronary disease with a history of coronary artery bypass      grafting in the past with multiple catheterizations since, last one      being February of 2007 revealing patent grafts and normal left      ventricular function.  4. Anxiety.  5. Treated hypotension.  6. Gastroesophageal reflux disease.  7. Treated dyslipidemia.   HOSPITAL COURSE:  The patient is a 60 year old female followed by Dr.  [name] with a history of coronary disease.  She had bypass surgery in the  past and has had multiple caths and interventions since, her last  catheterization was in February of 2007.  She had patent grafts and she  is being treated medically.  She presented to the emergency room on  July 22, 2006 with chest pain worrisome for unstable angina.  She  was admitted to telemetry, started on IV heparin, CK/MB and troponin  were negative.  Her heparin was stopped the next day.  She was a little  hypotensive and we held her Norvasc.  She admit to some PVCs and  palpitations prior to having chest pain.  We added low dose beta  blocker.  The patient was seen by Dr. Tresa Endo on July 24, 2006.  She  related to Dr. Tresa Endo after a long discussion that she has a lot of  stress at home, apparently her children have had problems with drug  abuse and she is caring for a grandson of one of her children.  Dr.  Tresa Endo started her on Lexapro.  We feel that she can be followed up as an  outpatient.  At discharge, it was noted that the patient was taking  Nexium, Zegerid, Prilosec and omeprazole.  I asked her to contact Dr.  Richarda Osmond office about this and clarify the medicine of choice.  Other medicines at discharge, Lipitor 80 mg daily, Zetia 10 mg daily,  aspirin 81 mg daily, Singulair 10 mg daily, Spiriva once daily,  omeprazole 20 mg daily, Lexapro 10 mg daily, Coreg 3.125 mg daily,  Foltex once daily.  She will stop her Norvasc.   LABORATORY DATA:  White count 7.5, hemoglobin 12.2, hematocrit 35.1,  platelets 238.  Sodium 141, potassium 3.4, BUN 3, creatinine 0.56.  LDL  is 102, cholesterol is 172, CK/MB and troponin are negative times three.  Chest x-ray shows no acute findings.  INR is 1.2.   DISPOSITION:  The patient is discharged in stable condition.  She will  follow up with Dr. [name].  She has been instructed to contact Dr. Richarda Osmond office as well.   DICTATED BY:  Abelino Derrick, P.A.      [name], M.D.  Electronically Signed     JB/MEDQ  D:  07/24/2006  T:  07/24/2006  Job:  [id number]   cc:   Ulyess Mort, MD

## 2010-10-09 NOTE — Cardiovascular Report (Signed)
[place]  Patient:    [name], [name]                      MRN: [id number] Proc. Date: 08/29/00 Adm. Date:  [id number] Attending:  Virgina Evener CC:         Runell Gess, M.D.  Orville Govern (Office)  Cath Lab   Cardiac Catheterization  NAME OF PROCEDURE:   CARDIOLOGIST:  Lennette Bihari, M.D., F.A.C.C.  INDICATIONS:  Ms. [name] is a 60 year old black female who is status post initial bypass surgery in 1995 with a LIMA to the LAD, vein sequentially to her diagonal, intermediate and marginal, and vein to the PDA.  Her last catheterization September 2000 showed patent grafts.  The patient also has peripheral vascular disease and is status post left subclavian stenting, March 1999.  Additional problems include GERD, asthma and hyperlipidemia.  She was admitted to [facility] yesterday with recurrent symptoms of chest pain similar to her prior angina.  She was treated with Lovenox as well as IV nitroglycerin.  She did have mild T wave inversion in lead V2 on ECG. She is now referred for extended repeat catheterization.  HEMODYNAMIC DATA:  Central aortic pressure 120/56.  Left ventricular pressure 120/15.  ANGIOGRAPHIC DATA:  Left main coronary artery had diffuse 70-80% ostial and proximal stenosis.  The left anterior descending is a moderate-sized vessel that gave rise to two proximal diagonal vessels.  There was diffuse 90% stenosis between the first and second diagonal vessels.  There also was 70% narrowing of the region of the first diagonal vessel.  The circumflex vessel was totally occluded proximally.  The right coronary artery had 50% proximal stenosis.  There was a 95% eccentric stenosis in the proximal right coronary artery before takeoff of a large acute marginal branch.  After the acute marginal branch the right coronary artery had diffuse narrowing of 60-70% and it was totally occluded. The  vein graft to the right coronary artery was widely patent.  There was no disease in the PDA or PLA system.  Of note, this vein graft did not supple the acute marginal branch, which was jeopardized from 95% proximal native right coronary artery stenosis.  The sequential vein graft to the first diagonal, obtuse marginal/intermediate and distal circumflex was widely patent.  Next, the left subclavian artery stent was widely patent.  The LIMA anastomosed into junction of the second diagonal and LAD, and was free of disease.  Biplane cine left ventriculography revealed normal LV function with mild hypocontractility inferiorly and posterolaterally.  Ejection fraction is probably 50-55%.  Next, distal aortography showed 20% left renal artery smooth narrowing.  There was 20% mild infrarenal narrowing.  After completion of the diagnostic catheterization the angiograms were reviewed with the patient in detail.  The patient continues to have patent grafts.  However, she does have progressive disease in her native proximal right coronary artery with 95% stenosis, which is jeopardizing a large acute marginal branch.  Even though the vein graft to the distal right coronary artery was widely patent this acute marginal branch was not supplied by this saphenous vein conduit. For this reason decision was made to attempt coronary intervention.  Six French sheath was upgraded to a 7 [place] system.  Double bolus of Integrelin and weight-adjusted heparinization were administered, and the patient received 3,000 units of heparin.  Initial attempt was performed with a 7 [place] FR4 guide.  However, catheter backup  was difficult and ultimately this was exchanged for a hockey stick 7 [place] guide.  A Patriot wire was advanced down the acute marginal vessel.  Initial attempts at performing primary stenting were unsuccessful.  Consequently the stent was removed and predilatation was done with a 2.5 x 15 mm  Maverick balloon up to four atmospheres.  A 3.0 x 18 mm S7 stent was then successfully deployed and dilated to 3.15 mm size.  Scout angiography confirmed an excellent angiographic result.  The 95% stenosis was reduced to 0%.  There was TIMII-3 flow.  There was no evidence for dissection.  IMPRESSIONS: 1. Normal left ventricular function with mild inferior and posterolateral    hypocontractility. 2. Significant native coronary obstructive disease with 70-80% diffuse left    main stenosis; 90% diffuse proximal left anterior descending stenosis with    70% first diagonal stenosis; total occlusion of the proximal right coronary    artery; and, progressive 95% eccentric stenosis in the proximal native    right coronary artery jeopardizing a large acute marginal branch, which    arose from the right coronary artery prior to the distal right coronary    artery occlusion. 3. Patent vein graft to the posterior descending artery, posterolateral    artery system. 4. Patent sequential vein graft to the first diagonal, obtuse marginal vessel    and distal circumflex coronary artery. 5. Patent stent in the left subclavian artery with a patent left internal    mammary artery to the second diagonal left anterior descending territory. 6. Successful percutaneous transluminal coronary angioplasty stenting of the    native right coronary artery with a 95% stenosis being reduced to 0%    (3.0 x 18 mm S7 stent) done with double bolus Integrelin and weight-    adjusted heparinization. DD:  08/29/00 TD:  08/29/00 Job: [id number]

## 2010-10-09 NOTE — Procedures (Signed)
[facility]  Patient:    [name], [name] Visit Number: [id number] MRN: [id number]          Service Type: PMG Location: TPC Attending Physician:  Rolly Salter Proc. Date: 03/21/01 Admit Date:  03/06/2001   CC:         Julio Sicks, M.D.   Procedure Report  [name] comes to [facility] today for continuation and management of low back pain.  We are planning a transforaminal epidural injection at L4-5 and 5-1, as she does lateralize her symptoms.  She is instructed to maintain contact with Dr. Jordan Likes.  In fact, she has had significant improvement after transforaminal epidural #1, with improvement in range of motion, functional indices, and decreased medication usage.  She relates no new neurological features.  Do not believe further diagnostics are warranted at this time.  Objectively, she has diffuse paralumbar myofascial discomfort, impaired flexion and extension, lateral rotational pain.  She has no new neurological features motor, sensory, reflex.  IMPRESSION:  Degenerative spinal disease of lumbar spine.  PLAN:  Transforaminal epidural injection.  This will be at independent needle injection points, L4-5 and 5-1, left side.  Follow up with Dr. Jordan Likes for further directed care as well as with [place] if further management physician is desired.  DESCRIPTION OF PROCEDURE:  The patient is taken to the fluoroscopy suite and placed in the prone position, the back prepped and draped in the usual fashion.  Using a 22 gauge spinal needle, I advanced to the left transforaminal epidural space at L5-S1 and L4-5 at independent needle access points.  I confirmed placement in multiple fluoroscopic positions.  I used ICP-200.  With withdraw technique, I inject 2 cc of lidocaine 1% MPF and 20 mg of Aristocort at each location.  She tolerated this procedure well.  No complication from the injection, discharge instructions given.  She was  discussed within the context of activities of daily living, risk of fall instructions given.  Extensive consultation. Attending Physician:  Rolly Salter DD:  03/21/01 TD:  03/22/01 Job: [id number]

## 2010-10-09 NOTE — H&P (Signed)
[facility]  Patient:    MAISLEY, HAINSWORTH Visit Number: [id number] MRN: [id number]          Service Type: PMG Location: TPC Attending Physician:  Rolly Salter Dictated by:   Jewel Baize Stevphen Rochester, M.D. Admit Date:  03/06/2001                           History and Physical  There was no dictation on this report. Dictated by:   Jewel Baize Stevphen Rochester, M.D. Attending Physician:  Rolly Salter DD:  03/07/01 TD:  03/07/01 Job: [id number]

## 2010-10-09 NOTE — Cardiovascular Report (Signed)
NAME:  [name], FESSENDEN                         ACCOUNT NO.:  [id number]   MEDICAL RECORD NO.:  [id number]                   PATIENT TYPE:  INP   LOCATION:  3714                                 FACILITY:  [facility]   PHYSICIAN:  Richard A. Alanda Amass, M.D.          DATE OF BIRTH:  [birth year]   DATE OF PROCEDURE:  01/17/2002  DATE OF DISCHARGE:                              CARDIAC CATHETERIZATION   PROCEDURES PERFORMED:  1. Retrograde central aortic catheterization.  2. Selective coronary angiography by Judkins technique.  3. Left ventricular angiogram, RAO, LAO projection.  4. Subselective left internal mammary artery, left subclavian injection,     saphenous vein graft angiographies.  5. Abdominal angiogram, hand injection.   DESCRIPTION OF PROCEDURE:  Catheterization was performed under 1% Xylocaine  anesthesia with the patient in the postabsorptive state after 5 mg of Valium  p.o. premedication. The CRFA was entered with an anterior puncture using an  18 thin wall needle and a 6 [place] Daig side-arm sheath was inserted without  difficulty. Diagnostic coronary angiography was performed with a 6 French 4  cm taper preformed coronary and pigtail catheters using Omnipaque dye  throughout the procedure. Selective LIMA was done with the right coronary  catheter, subselective. Left subclavian injection was done by hand injection  with the right coronary catheter.  Saphenous vein graft angiography was done  using the right coronary catheter. LV angiogram was done in the RAO and LAO  projection at 25 cc 14 cc/sec. and 20 cc 12 cc/sec. respectively.  Pullback  pressure of the CA showed no gradient across the aortic valve.  Hand  injections above the level of the renal arteries revealed single patent  renal arteries with no significant narrowing visualized and no immediate  infrarenal atherosclerotic disease of significance. The catheter was  removed, side-arm sheath was flushed.  Heparin was  on hold on coming to the  catheterization lab.  ACT was 120. During the procedure, the patient  received 2 mg of morphine sulfate and 25 mg of fentanyl for sedation. She  tolerated the procedure well and was transferred to the holding area for  sheath removal and pressure hemostasis.   HEMODYNAMIC DATA:  PRESSURES:  1. LV: 143/0; LVEDP 16-18 mmHg.  2. CA:  143/70 mmHg.  3. There is no gradient across the aortic valve on catheter pullback.  4. Fluoroscopy reveals 1 to 2+ right and left coronary calcification.  No     intracardiac or valvular calcification.   LEFT VENTRICULAR ANGIOGRAM:  LV angiogram demonstrated hypokinesis of the  distal third of the anterolateral wall and the inferior wall.  There was  hypo - akinesis of the posterior apical segment.  Estimated EF was greater  than 50%. There was no mitral regurgitation present.   CORONARY ANGIOGRAPHY:  1. The main left coronary had 50-70% narrowing with catheter damping with a     6 French catheter. This is unchanged.  2. The left anterior descending artery had a 90-95% stenosis beyond the     first diagonal branch, which was grafted. This was unchanged. There was     bidirectional flow to the LIMA and good flow to the native circumflex     through the sequential diagonal graft. The distal LAD had no significant     stenosis, was a fairly large vessel, coursed to the apex of the heart and     there were two small diagonal branches, patent beyond the LIMA insertion     after the mid LAD.  3. The first diagonal branch had 90-95% proximal stenosis which was     unchanged and was a relatively small vessel.  4. The circumflex was totally occluded after a small marginal branch in the     proximal third and this is unchanged.  5. The right coronary had 60% narrowing in the proximal third, a widely     patent stent in the midportion (April 8, 202002) and 40-50% narrowing     beyond the stent before the RV branch. The RV branch was widely  patent,     trifurcated distally with good flow. The right coronary was totally     occluded beyond the RV branch.  6. Saphenous vein graft to the distal RV supplied the PDA and PLA branches     and an intermediate branch was widely patent.  There is mild irregularity     near the ostia but no significant stenosis, good flow throughout and no     significant disease beyond the distal anastomosis.  7. The LIMA was widely patent.  There was irregularity with 40% subclavian     narrowing beyond the LIMA.  The left vertebral was antegrade and the left     subclavian had less than 20% narrowing within the stent with no     transstenotic gradient.  The LIMA was widely patent with an excellent     anastomosis to the mid LAD.  8. Sequential saphenous vein grafts to the diagonal #1 and two mid and     distal OM branches was widely patent.  The branches were relatively     small, but there was good flow and no anastomotic stenosis or stenosis     beyond and this is unchanged.   DISCUSSION:  This debilitated 60 year old Native American [place] is married,  has had considerable stress at home with her husband, continues to smoke and  has had a numerable hospital admissions for chest pain, and  catheterizations. She underwent multivessel CABG on February 01, 1994, for  severe three-vessel and main left coronary disease. Since that time, she has  had multiple catheterizations showing widely patent grafts which were  essentially unchanged from her current angiogram. She has had an  intercurrent stent placed to the mid RCA before the total occlusion in the  event that she was having ischemia from the RV branch (August 29, 2000, Dr.  Tresa Endo 3.0/18 S7 stent). There has been no long-term re-stenosis of this and  there is minor disease beyond this and the RV branch does not appear  significantly jeopardized.  She is readmitted now with chest pain without myocardial infarction with a  very flat and somewhat  inappropriate affect, and admitting to some  considerable anxiety at home. She has had prior upper GI disease under the  care of Dr. Victorino Dike.   The etiology of her chest pain is not clear.  She had received a  course of  EECP as an outpatient, which may have been somewhat helpful but is difficult  to measure. I believe she may be a candidate for GI evaluation which has not  been done for over a year in case there is a GI component to her refractory  and recurrent chest pain syndrome, which does not appear to be coronary  related or typically related to any findings to suggest coronary spasm.   Recommend reassurance. Consider for GI consultation and psychiatry  consultation.  Cardiac followup with Dr. [name].   CATHETERIZATION DIAGNOSES:  1. Arteriosclerotic heart disease, status post coronary artery bypass graft     x5, February 01, 1994.  2. Mid right coronary artery stent to protect obtuse marginal branch widely     patent on this study (August 29, 2000, stent).  3. Mild segmental wall motion abnormalities, well preserved left ventricular     function, ejection fraction greater than 50%.  4. Widely patent left subclavian stent, remote.  5. Widely patent grafts, unchanged from previous multiple catheterizations,     last of which was Sep 25, 2001.  6. Anxiety and depression.  7. Cigarette abuse.  8. Hyperlipidemia.  9. Systemic hypertension, normal renal arteries.  10.      Chronic obstructive pulmonary disease, bronchitis.  11.      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                                               Richard A. Alanda Amass, M.D.    RAW/MEDQ  D:  01/17/2002  T:  01/19/2002  Job:  [id number]   cc:   Runell Gess, M.D.  [phone], [address]  [place]  [place] [address]  Fax: [phone]   [facility]   Virgilina R. Felipa Eth, M.D.   Lennette Bihari, M.D.  [address]  [place]  [place] [address]  Fax: [phone]   Ulyess Mort, M.D. [facility]

## 2010-10-09 NOTE — Discharge Summary (Signed)
[place]  Patient:    [name], [name] Visit Number: [id number] MRN: [id number]          Service Type: MED Location: [phone] Attending Physician:  Berry, Jonathan [place] Dictated by:   Abelino Derrick, P.A.C. Admit Date:  09/22/2001 Discharge Date: 09/26/2001   CC:         Ravi R. Felipa Eth, M.D.   Discharge Summary  DISCHARGE DIAGNOSES: 1. Unstable angina. 2. Known coronary disease with catheterization this admission demonstrating    patent grafts. 3. Coronary artery bypass grafting in 1995. 4. Treated hyperlipidemia. 5. Treated hypertension. 6. Asthmatic chronic obstructive pulmonary disease. 7. Gastroesophageal reflux disease.  HOSPITAL COURSE:  Patient is a 60 year old female known to Dr. [name] with a history of coronary disease.  She had bypass surgery after a DMI in 1995.  She has been catheterized previously.  Last year she had patent grafts and normal LV function.  She presented Sep 22, 2001 with recurrent chest pain consistent with unstable angina.  She was seen by Dr. [name], started on heparin, and set up for catheterization.  Enzymes were negative.  Catheterization was done by Dr. Tresa Endo Sep 25, 2001 which revealed patent grafts with a patent LIMA to the LAD, patent SVG to the diagonal OM I and OM II, and a patent SVG to the RCA. She had good LV function with normal renal arteries, normal iliacs.  PLAN:  Medical therapy.  Dr. Tresa Endo has arranged for her to have CCP as an outpatient.  She will follow up with Dr. [name] after this.  DISCHARGE MEDICATIONS:  1. Altace 2.5 mg q.d.  2. Lipitor 80 mg q.d.  3. Toprol XL 25 mg one-half tablet q.d.  4. Imdur 30 mg q.d.  5. Nitroglycerin sublingual p.r.n.  6. Foltx two tablets q.d.  7. Prilosec 20 mg q.d.  8. Estratest q.d.  9. Coated aspirin q.d. 10. Inhalers as taken at home which include albuterol, Atrovent, Flovent, and     Serevent. 11. Singulair 10 mg q.d. 12. Imdur 30 mg  q.d.  LABORATORIES:  White count 7.1, hemoglobin 12, hematocrit 34, platelets 219,000.  INR 1.0.  Kidney function shows sodium 142, potassium 3.6, BUN 8, creatinine 0.8.  CK, MB, troponin negative x3.  Lipid profile showed a total cholesterol of 182, HDL 52, LDL 120.  Chest x-ray showed prior median sternotomy with no active disease.  EKG revealed sinus rhythm, sinus bradycardia without acute changes.  DISPOSITION:  Patient is discharged in stable condition.  Will follow up with Dr. [name] in a few weeks. Dictated by:   Abelino Derrick, P.A.C. Attending Physician:  Berry, Jonathan [place] DD:  09/26/01 TD:  09/28/01 Job: [id number]

## 2010-10-09 NOTE — Consult Note (Signed)
[name], [name]               ACCOUNT NO.:  [id number]   MEDICAL RECORD NO.:  [id number]          PATIENT TYPE:  INP   LOCATION:  6526                         FACILITY:  [facility]   PHYSICIAN:  Iva Boop, M.D. LHCDATE OF BIRTH:  1950/11/18   DATE OF CONSULTATION:  07/14/2005  DATE OF DISCHARGE:  07/15/2005                                   CONSULTATION   REQUESTING PHYSICIAN:  Dr. [name].   REASON FOR CONSULTATION:  Chest pain, ? gastrointestinal causes.   ASSESSMENT:  Chest pain.  Cardiac catheterization has shown patent grafts  and she has ruled out for myocardial infarction.  She had about a day and a  half of chest pain.  She is on Prilosec twice daily, so I do not think  esophageal reflux is the cause, as she had good control of heartburn and has  not had repeated episodes of chest pain and given the duration.  The  possibility of esophageal spasm may exist, though again I would not think  that it would last for a day and a half.  In addition, panic attack is  possible.  She had some fatigue as well.   RECOMMENDATIONS AND PLAN:  I would observe her and continue her current  medications.  It has not happened frequently enough to initiate any therapy  for panic attacks or esophageal spasm, as best I can tell.  She had an EGD  in 2002 by Dr. Victorino Dike that demonstrated what he thought was some  esophageal spasm.  She had a hiatal hernia.  He dilated her esophagus.  She  does not have dysphagia now.  She had a barium swallow in 2003 that was  normal.  She has also had an ultrasound of the abdomen and a HIDA scan with  ejection fraction in 2005 for right upper quadrant that were okay.  There  was some question of renal abnormality, but this was not confirmed on CT  scanning in 2005.   HISTORY:  [age]-year-old Native American woman that was admitted with  severe substernal chest pressure like an elephant on her chest that went on  for about a day and a half  and was not relieved by nitroglycerin.  She has  known coronary artery disease and had a coronary artery bypass grafting  before and had a cardiac catheterization today which showed patent grafts.  There was a question of noncardiac chest pain.  She denies any dysphagia,  weight loss or anorexia.  For about a day and a half, she was very tired and  had this severe pressure-like pain.  She was a little sweaty, a little  nauseated and a little tachycardic.  She does not sleep well, but that has  always been that way.  She denies any overt anxiety, though she says I've  had a lot on me.  Her husband had been very ill in the summer, but is  recovering.  They have a 20-year-old child.  She did not elaborate, though  she did go on to say she did not want any nerve pills.  DRUG ALLERGIES:  PLAVIX causes hives.  METHYLPREDNISOLONE causes swelling.   MEDICATIONS ON ADMISSION:  1.  Prilosec 20 mg twice daily.  2.  Singulair 10 mg daily.  3.  Lipitor 80 mg daily.  4.  Foltx daily.  5.  Aspirin 81 mg twice a day.  6.  Albuterol two puffs 4 times a day.  7.  Nasonex p.r.n.  8.  Nitroglycerin p.r.n.   PAST MEDICAL HISTORY:  1.  Coronary artery disease, as above.  2.  Peripheral vascular disease.  3.  Reflux disease and hiatal hernia as mentioned.  4.  Hypertension.  5.  Dyslipidemia.  6.  Anxiety.  7.  Colonoscopy, August '06 showing mixed hemorrhoids (small), 4-mm polypoid      lesion removed, but it was normal tissue.  8.  History of bleeding peptic ulcer disease.  9.  Lumbar laminectomy with chronic back pain.  10. Prior carpal tunnel release.  11. Hysterectomy without oophorectomy in 1998.  12. 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   SOCIAL HISTORY:  Former smoker, married, lives in [place], social  history as otherwise noted.   FAMILY HISTORY:  Family history of lung disease, diabetes, breast cancer.   REVIEW OF SYSTEMS:  She has had some increased dyspnea on exertion.  She  has  had some urinary frequency.  She has been a little weak, as noted.  She has  nocturia.  All other systems appear negative, other than those things  mentioned above.   PHYSICAL EXAMINATION:  GENERAL:  Physical exam reveals a pleasant middle-  aged Native American woman in no acute distress.  VITAL SIGNS:  Temperature 98.2, blood pressure 106/52, pulse 54,  respirations 20.  HEENT:  Eyes anicteric.  Mouth free of lesions.  NECK:  Supple.  CHEST:  Clear.  Nontender chest wall.  HEART:  S1 and S2.  No rubs or gallops.  ABDOMEN:  Soft and nontender without organomegaly or mass.  EXTREMITIES:  There is a pressure dressing in the right groin.  Extremities  are without edema.  SKIN:  No acute rash.  NEUROLOGIC:  She is alert and oriented x3.  Affect is slightly flat I think.   LABORATORY DATA:  Hemoglobin 12, hematocrit 34, white count 5.9, platelets  256,000.  Coagulations normal.  CMET normal except for albumin 3.6, total  protein 5.6, cholesterol 304, triglycerides 180.  Troponins and CK-MBs  negative x2.   EKG:  Normal except what looks like perhaps slight low voltage.  She had  sinus bradycardia on one.   ASSESSMENT:  Additional thoughts would be perhaps pericarditis, though I  suspect that would be seen at catheterization or evident on x-ray, though  she had what I think are some low voltages in her limb leads, but not so  much in the precordial leads.  Other consideration would be microvascular  angina, though that seems unlike.   Also another consideration would be a respiratory process, given her asthma,  and so forth, though it is atypical for that.   I appreciate the opportunity to care for this patient.      Iva Boop, M.D. [facility]  Electronically Signed     CEG/MEDQ  D:  07/14/2005  T:  07/15/2005  Job:  [id number]   cc:   [name], M.D.  Fax: [phone]   Larina Earthly, M.D.  Fax: [phone]

## 2010-10-09 NOTE — Discharge Summary (Signed)
NAMEVASILISA, VORE NO.:  [id number]   MEDICAL RECORD NO.:  [id number]          PATIENT TYPE:  INP   LOCATION:  6526                         FACILITY:  [facility]   PHYSICIAN:  [name], M.D.   DATE OF BIRTH:  [birth year]   DATE OF ADMISSION:  07/13/2005  DATE OF DISCHARGE:  07/15/2005                                 DISCHARGE SUMMARY   [name] is a 60 year old female patient of Dr. [name] who presented to the  hospital with complaints of chest pain. She has a history of coronary artery  disease and in the past underwent coronary artery bypass grafting in 1995.  This time the pain continued through the night and the patient was unable to  sleep. Also had shortness of breath and had to take three nitroglycerins  with partial relief. Eventually she was brought to the emergency room.   She was admitted on rule out MI protocol and underwent coronary angiography.   HOSPITAL PROCEDURE:  Cardiac catheterization performed on July 14, 2005,  by Dr. [name]. It revealed patent grafts, but the patient does have severe  native vessel disease with ___________ grafts and previously stented areas  of the native RCA are patent.   HOSPITAL CONSULTATION:  [facility] saw the patient on July 14, 2005. Dr.  Leone Payor did feel like at this time that GI workup would be necessary. They  suggest that the patient continue PPI and to see how things are going and if  she does not improve then they will see her an outpatient.   We cycled her enzymes and they were negative. Laboratories were as follows:  Hemoglobin was 12.2, hematocrit 34.7, white blood cell count 5.9, platelet  count 166,000. Sodium 142, potassium 3.7,  BUN 6, creatinine 0.7.   Her lipid profile revealed a total cholesterol of 304, triglycerides 180,  HDL 46, LDL 222.   Dr. [name] saw the patient on the day of discharge and considered her to be  stable for discharge home.   DISCHARGE MEDICATIONS:  1.  Norvasc 5  mg daily.  2.  Lipitor 80 mg daily.  3.  Aspirin 81 mg daily.  4.  Nexium 40 mg daily.  5.  Foltx daily.  6.  Singulair 10 mg daily.  7.  Albuterol p.r.n. as before.  8.  Nasonex p.r.n. as before.   DISCHARGE DIET:  Low fat, low cholesterol diet.   DISCHARGE ACTIVITY:  No driving, no lifting greater than 5 pounds for three  days; no strenuous activity. The patient was instructed to avoid spicy fried  food.   DISCHARGE FOLLOWUP:  Dr. [name] will see her on March 14th at 2:30 p.m.      Raymon Mutton, P.A.      [name], M.D.  Electronically Signed    MK/MEDQ  D:  07/15/2005  T:  07/15/2005  Job:  [id number]   cc:   [facility]

## 2010-10-09 NOTE — Cardiovascular Report (Signed)
[name], [name]               ACCOUNT NO.:  [id number]   MEDICAL RECORD NO.:  [id number]          PATIENT TYPE:  INP   LOCATION:  4741                         FACILITY:  [facility]   PHYSICIAN:  Darlin Priestly, M.D.DATE OF BIRTH:  [birth year]   DATE OF PROCEDURE:  02/13/2004  DATE OF DISCHARGE:  02/14/2004                              CARDIAC CATHETERIZATION   PROCEDURES:  1.  Left heart catheterization.  2.  Coronary angiography.  3.  Left ventriculogram.  4.  Saphenous vein graft.  5.  Left pulmonary angiography.  6.  Abdominal aortogram.  7.  Left subclavian.   ATTENDING:  Dr. Lenise Herald.   COMPLICATIONS:  None.   INDICATIONS:  Ms. [name] is a 60 year old female, patient of Dr. [name], with a history of coronary artery disease status post coronary artery  bypass surgery approximately 10 years ago consisting of LIMA to LAD,  sequential vein graft to diagonal and first and second OM and sequential  vein graft to PDA and posterolateral branch.  She also has a history of  peripheral vascular disease status post PTA stenting of her left subclavian.  She was readmitted on February 12, 2004 with recurrent chest pain and  subsequently ruled out for myocardial infarction.  Her last Cardiolite scan  was in January 2005 with no significant ischemia.  Her last cardiac  catheterization was in 2003 revealing widely patent grafts.  She is now  referred for repeat catheterization to reassess to her coronary artery  disease.   DESCRIPTION OF PROCEDURE:  After informed written consent the patient was  brought to the cardiac catheterization lab.  The right groin was shaved,  prepped and draped in the usual sterile fashion.  ECG monitor was  established.  Using the modified Seldinger technique a #6 French arterial  sheath to the right femoral artery.  A 6 French diagnostic catheter was used  to perform diagnostic angiography.   The left main is a medium-size vessel which is  diffusely diseased up to 70%  in its proximal portion.   LAD is a medium-size vessel which coursed to the apex and goes to three  diagonal branches.  There is competitive flow into the mid and distal LAD  via a patent LIMA to the LAD.  There is no significant disease in the IMA or  distal IMA insertion.   The first diagonal is a medium-size vessel which also fills via competitive  flow.  There is a widely patent sequential vein graft to the first diagonal  and continues on to the first and second OM.  There is 50% stenosis in the  first diagonal beyond the vein graft touchdown, but no significant disease  in the OMs.   The second and third diagonals are medium-size vessels with no significant  disease.   The left circumflex was totally occluded in its proximal portion.   As previously stated, the first and second OMs fill via a sequential vein  graft from the first diagonal and are widely patent.   The right coronary artery is a medium-size vessel which is diffusely  diseased  up to 90% in its proximal segment, 60% mid, and totally occluded in  its mid segment.  The PDA and posterolateral branch fill via a patent  saphenous vein graft sequentially into the PDA and posterolateral branch.  There is no significant disease in the body of the graft, or distal to the  graft insertion.   Left ventriculogram reveals a mildly depressed EF of 45-50% with mild global  hypokinesis.   Left subclavian angiogram reveals a widely patent left subclavian stent.   Her abdominal aortogram reveals 40% left ostial renal artery stenosis with  mild distal aortic disease.   Hemodynamics:  Systemic arterial pressure 99/57, LV system pressure 100/1,  LVEP of 14.   CONCLUSION:  1.  Significant left main and three vessel coronary artery disease.  2.  Patent left internal mammary artery to left anterior descending artery.  3.  Patent sequential vein graft to the first diagonal, first and second       obtuse marginal with no significant disease beyond the graft insertion.  4.  Patent sequential vein graft to the posterior descending artery and      posterolateral branch.  5.  Mildly depressed left ventricular function with wall motion      abnormalities as noted above.  6.  Widely patent left subclavian stent.  7.  There is 40% left renal artery ostial disease.  8.  Mild distal aortic disease.       RHM/MEDQ  D:  02/13/2004  T:  02/14/2004  Job:  [id number]

## 2010-10-09 NOTE — Cardiovascular Report (Signed)
[place]  Patient:    [name], [name] Visit Number: [id number] MRN: [id number]          Service Type: MED Location: 2000 2014 01 Attending Physician:  Berry, Jonathan [place] Dictated by:   Runell Gess, M.D. Proc. Date: 02/08/01 Admit Date:  02/07/2001   CC:         Second Floor [place] [facility]  [facility], [place] [address] [place], [place] [address]             Lennon Alstrom. Felipa Eth, M.D.   Cardiac Catheterization  INDICATIONS:  [name] is a 60 year old Native American female with a long history of CAD and PVOD.  She has had coronary artery bypass grafting and left subclavian artery PTA and stenting.  Her last intervention was in April of this year, at which time Dr. Tresa Endo put in a stent in her native right, jeopardizing a large acute marginal branch.  Her grafts were patent at that time.  Over the last several days, she has noticed increasing symptoms consistent with unstable angina requiring sublingual nitroglycerin.  She was admitted from the office for IV heparin and nitroglycerin, rule out MI, and diagnostic coronary arteriography.  PROCEDURE DESCRIPTION:  The patient was brought to the second floor  Cardiac Catheterization Laboratory in the postabsorptive state.  She was premedicated with p.o. Valium.  Her right groin was prepped and shaved in the usual sterile fashion.  One percent Xylocaine was used for local anesthesia.  A 6-French sheath was inserted into the right femoral artery using the standard Seldinger technique.  Six French right and left Judkins diagnostic catheters, along with a 6-French pigtail catheter were used for selective coronary angiography, left ventriculography, left subclavian artery angiography, and selective IMA angiography, as well as vein graft angiography. Supravalvular aortography was also performed in the LAO cranial view. Omnipaque dye  was used for the entirety of the case.  Retrograde aortic and left ventricular and pullback pressures were recorded.  HEMODYNAMICS: 1. Aortic systolic pressure 110, diastolic pressure 55. 2. Left ventricular systolic pressure 110, end-diastolic pressure 18.  SELECTIVE CORONARY ANGIOGRAPHY: 1. Left main:  The left main had approximately 70% ostial/proximal stenosis. 2. Left anterior descending:  The LAD had complex segmental 95% stenosis in    the proximal portion after two moderate sized diagonal branches.  The first    diagonal branch appeared to be bypassed.  The second diagonal branch was    small and had 95% proximal stenosis. 3. Ramus intermedius branch:  Totaled ostially. 4. Circumflex:  Totaled proximally. 5. Right coronary artery:  Totaled in the mid portion after the takeoff of an    acute marginal branch.  The proximal RCA had approximately 40-50% segmental    stenosis.  The stent in the native mid RCA was widely patent.  VEIN GRAFTS: 1. Vein graft to the diagonal, ramus, and obtuse marginal branch sequentially    was widely patent. 2. Vein graft to the distal right and PLA was widely patent. 3. LIMA to the LAD was widely patent, as was the subclavian stent.  LEFT VENTRICULOGRAPHY:  RAO left ventriculogram was performed using 20 cc of Omnipaque dye at 10 cc/sec.  The overall LVEF was estimated at greater than 60%, without focal wall motion abnormalities.  SUPRAVALVULAR AORTOGRAPHY:  Supravalvular aortogram was performed in the LAO cranial view using 20 cc of Omnipaque dye at 20 cc/sec.  There was no evidence of aortic  dissection.  The arch vessels appeared intact.  IMPRESSION:  [name] has widely patent grafts with normal systolic function and no evidence of aortic dissection.  I am unclear of the etiology of her chest pain, but I do not feel that there is ischemia substrate.  IV heparin and nitroglycerin were discontinued.  An ACT was measured and the sheaths were  removed.  Pressure was held on the groin to achieve hemostasis. The patient left the laboratory in stable condition.  PLAN:  The plan is to treat her empirically for antireflux and obtain a GI evaluation, possibly resulting in EGD. Dictated by:   Runell Gess, M.D. Attending Physician:  Berry, Jonathan [place]az[place] DD:  02/08/01 TD:  02/08/01 Job: [id number]

## 2010-10-09 NOTE — Discharge Summary (Signed)
[place]  Patient:    Whitney, [name]                      MRN: [id number] Adm. Date:  [id number] Disc. Date: [id number] Attending:  Ruta Hinds Dictator:   Darcella Gasman. Annie Paras, F.N.P.C. CC:         Richard A. Alanda Amass, M.D.             Fransisco Hertz, M.D.             Ravi R. Felipa Eth, M.D.             Runell Gess, M.D.                           Discharge Summary  DISCHARGE DIAGNOSES:  1. Pneumonia.  2. Severe back and chest pain.     a. Negative myocardial infarction.     b. Negative pulmonary embolus.     c. Negative PVT.  3. Headache, resolved.     a. Normal CT of head.  4. Hypotension that was symptomatic with dizziness, improved and stable.  5. Chronic obstructive pulmonary disease.  6. Coronary artery disease.  7. Labyrinthitis.  DISCHARGE CONDITION: Improved.  OPERATION/PROCEDURE: On September 15, 1999 the patient underwent pulmonary arteriogram by Dr. Maple Hudson with radiology.  DISCHARGE MEDICATIONS:  1. Nitro-Dur patch 1 on at bedtime and off at 5 a.m.  2. Sublingual nitroglycerin p.r.n.  3. Estratest q.d.  4. Aspirin q.d.  5. Zocor 80 mg q.h.s.  6. Albuterol 90 mg 2 puffs q.i.d.  7. Atrovent inhaler 2 puffs b.i.d.  8. Flovent 110 mcg 2 puffs b.i.d.  9. Serevent MDI 2 puffs b.i.d. 10. Singulair 10 mg at h.s. 11. Nasonex spray. 12. Altace 1.25 mg q.d. 13. Augmentin 500 mg b.i.d. 14. Z-pak for five days as directed.  DISCHARGE ACTIVITY: As tolerated.  DISCHARGE DIET: Low-fat/low-salt diet.  FOLLOW-UP: See Dr. [name] on Sep 30, 1999 at 9:50 a.m.  HISTORY OF PRESENT ILLNESS: This patient is a 60 year old married white female with a history of bypass surgery in 1995 and PTCA and stents since that time, with patent grafts in September 2000, who presented to the ER on September 10, 1999.  She had been doing well but had some back pain the evening prior to admission and went to bed and on the day of admission felt bad  around 11:30 a.m.  She developed chest pressure with radiation to her back, up into her neck, and down the left arm.  Associated symptoms included nausea and vomiting x 2 and diaphoresis.  She took two sublingual nitroglycerin with little help. EMS was called and she was given another sublingual nitroglycerin without help.  She stated the angina was different from before bypass surgery, which was at that time burning in her throat.  In the emergency room she continued with significant pain with nitroglycerin drip at 5 and a total of 4 morphine IV.  EKG was without changes.  Dr. Mayford Knife evaluated her in the ER.  The pain continued.  It was difficult to go up on nitroglycerin secondary to borderline blood pressure of 100.  IV fluids were started.  PAST MEDICAL HISTORY:  1. Coronary artery bypass graft in 1995 with LIMA to the LAD, sequential     saphenous vein graft to the diagonal, intermediate, and OM, saphenous vein     graft to the potassium descending.  The last catheterization was in     September 2000 with patent grafts noted.  2. History of asthma.  3. Hyperlipidemia.  4. Stent implant to left subclavian artery in March 1999.  ALLERGIES: No known drug allergies.  OUTPATIENT MEDICATIONS:  1. Nitro-Dur patch.  2. Sublingual nitroglycerin.  3. Estratest.  4. Aspirin.  5. Zocor.  6. Albuterol.  7. Atrovent.  8. Flovent.  9. Serevent. 10. Singulair. 11. Nasonex.  FAMILY HISTORY, SOCIAL HISTORY, REVIEW OF SYSTEMS: See History and Physical.  DISCHARGE PHYSICAL EXAMINATION:  VITAL SIGNS: Blood pressure 105/60, heart rate 60, respirations 20, temperature 97.8 degrees.  CHEST: Clear to auscultation bilaterally.  HEART: S1 and S2, regular rate and rhythm, 3/6 systolic murmur.  EXTREMITIES: Without edema.  LABORATORY DATA: Hemoglobin 13.4, hematocrit 38.2, WBC 11.2, MCV 93.8 on admission, with platelets of 267,000.  After that WBC came down to 5.5, hemoglobin remained  stable.  On admission neutrophils were 74%, lymphocytes 18%, monocytes 5%, eosinophils 1%, basophils 0%, leukocytes 2%.  Sedimentation rate was 15.  Pro time 14.7, INR 1.3, PTT 35, on heparin was therapeutic.  Chemistries showed a sodium of 139, potassium 3.6, chloride 105, CO2 28, glucose 88, BUN 6, creatinine 0.5.  Calcium 9, total protein 6.8, albumin 3.8, AST 17, ALT 13, ALP 74, total bilirubin 0.4.  Magnesium 2.  Amylase 62, lipase 21.  Phosphorus 3.5.  Cardiac enzymes ranged from 71-87, MB less than 0.3, and troponin I less than 0.03.  Lipids showed a total cholesterol of 179, triglyceride 74, HDL 40, LDL 124. Urinalysis was clear.  Blood cultures showed no growth at five days x 2 sites. Urine culture showed no growth.  RADIOLOGY: On September 10, 1999 bilateral lower lobe segmental areas of atelectasis or atelectatic pneumonia were noted.  Follow-up CT of the chest with contrast showed no evidence of PE, probable pleural based pneumonia in the right mid lateral lung field; other consideration that it could represent an area of resolving pulmonary infarction, with Hampton hump type deformity. No acute pulmonary embolic disease was identified.  Views of the lower extremities revealed no DVT.  PA and lateral chest x-ray on September 13, 1999 showed stable peripheral right middle lobe pneumonia, linear atelectasis in the left lower lobe, and on September 14, 1999 CT of the head showed no intracranial hemorrhage or enhancing mass seen.  EKG on admission showed sinus rhythm, no significant change from previous tracing of 1999.  On September 11, 1999 EKG again showed sinus rhythm, no significant change, and on September 14, 1999 showed no changes.  On September 15, 1999 pulmonary angiogram showed no PE.  HOSPITAL COURSE: [name] was admitted on September 10, 1999 with significant chest pain unrelieved with nitroglycerin and morphine.  She was admitted to  the transitional care unit.  No acute EKG changes were  seen and she was placed on nitroglycerin and heparin as well as IV fluids for moderate borderline blood pressure.  She complained of headaches with no relief from Darvocet and she was switched to Fioricet.  She continued with some productive cough and chest x-ray revealed pneumonia.  Spiral CT revealed no pulmonary embolism.  By September 12, 1999 her IV fluids and heparin were discontinued and she was to ambulate in her room.  She was transferred to 2000 on September 14, 1999 and blood pressure dropped.  She was given additional boluses of IV fluids and a dopamine drip was started, which stabilized her blood pressure.  Blood cultures were negative.  She had no fever, and she also complained of headache.  Infectious disease consultation was obtained as well a CT of her head, which revealed no masses and no hemorrhage.  Secondary to CT with questionable pulmonary infarction a pulmonary angiogram was done, which was negative.  She continued to improve and by September 15, 1999 her dopamine could be weaned with a stable blood pressure, and she was ready for discharge home. She will follow up as an outpatient with Dr. Alanda Amass. DD:  10/11/99 TD:  10/14/99 Job: [id number]

## 2010-10-09 NOTE — Discharge Summary (Signed)
NAME:  [name], [name]                         ACCOUNT NO.:  [id number]   MEDICAL RECORD NO.:  [id number]                   PATIENT TYPE:  INP   LOCATION:  4709                                 FACILITY:  [facility]   PHYSICIAN:  Runell Gess, M.D.             DATE OF BIRTH:  [birth year]   DATE OF ADMISSION:  01/03/2002  DATE OF DISCHARGE:  01/04/2002                                 DISCHARGE SUMMARY   ADMISSION DIAGNOSES:  1. Chest pain, questionable etiology.  2. Hypertension.  3. Hyperlipidemia.  4. Coronary artery disease.     a. Status post myocardial infarction in 1995 with a stent to the mid        right coronary artery.     b. Status post coronary artery bypass graft with left internal mammary        artery to left anterior descending, saphenous vein graft to obtuse        marginal and OD, saphenous vein graft to right coronary artery.     c. Last catheterization Sep 25, 2001, with patent grafts and plan for        continuing medical therapy and enhanced external counter-pulsation.  5. Peripheral vascular disease.   DISCHARGE DIAGNOSES:  1. Chest pain, questionable etiology.  Negative cardiac enzymes.  EKG with     no change.  2. Hypertension.  3. Hyperlipidemia.  4. Coronary artery disease.     a. Status post myocardial infarction in 1995 with a stent to the mid        right coronary artery.     b. Status post coronary artery bypass graft with left internal mammary        artery to left anterior descending, saphenous vein graft to obtuse        marginal and OD, and saphenous vein graft to right coronary artery.     c. Last catheterization Sep 25, 2001, with patent grafts and plan for        continuing medical therapy and enhanced external counter-pulsation.  5. Peripheral vascular disease.   HISTORY OF PRESENT ILLNESS:  The patient is a 60 year old American [place]  female with a history of hypertension, hyperlipidemia, peripheral vascular  disease, and a history of  CAD.  She had an MI in 1995 with a stent to the  mid RCA.  She ultimately had bypass surgery in 1995.  Her last  catheterization was Sep 25, 2001 which revealed patent graft and she was  continued medical therapy and for EECP.   She reports that she went to all of her EECP except for the last five  treatments and this was because her husband was in the hospital at that  time.   She states that over the last several months, she has been experiencing  occasional chest tightness and occasionally receives some nitroglycerin but  would gain relief with one nitroglycerin and it was only a  slight amount of  occasional discomfort.   However, starting this past Monday, January 01, 2002, she had onset of chest  tightness while at rest.  There was radiation to the left arm.  She did have  shortness of breath and nausea and diaphoresis but no emesis.  Throughout  the day on Monday and Tuesday, she continued to have chest tightness.  She  would take a second nitroglycerin with some relief but then it would quickly  return and she would use a third nitroglycerin.  Again, she would have some  relief but it would quickly return even after the third nitroglycerin.  That  had occurred all day Monday and Tuesday.  On our interview on that Wednesday  in the emergency room, she was continuing to have 7/10 chest tightness as  well as left arm pain. She was on IV nitroglycerin at a low dose because her  systolic blood pressure was in the 90s.  At the time she was evaluated by  Cristy Hilts. Jacinto Halim, M.D.  He thought the chest pain was atypical and pleuritic.  It was reproducible with palpation and had been continuous for two days.  EKG showed no changes.  At that time, he planned pain control for NSAIDs.  He turned off the nitroglycerin and felt that we do not need to add heparin.  Plan to admit her to check serial enzymes, rule out MI, and felt that if  everything remained normal, she could be discharged home the  following day.   HOSPITAL COURSE:  On January 04, 2002, the patient has no further chest pain  or shortness of breath.  Blood pressure was stable at 98/48, heart rate is  bradycardic in the 40s and 50s.  Oxygen saturation 92% on room air.  She has  maintained sinus rhythm with no ectopy.  EKG has shown sinus rhythm with no  changes.  Cardiac enzymes were negative x3.  At this time, she was seen and  evaluated by Darlin Priestly, M.D., who deems her stable for discharge  home.   CONSULTATIONS:  None.   PROCEDURES:  None.   An EKG shows normal sinus rhythm, no STT change.   LABORATORY DATA:  White count 8.8, hemoglobin 14.2, hematocrit 41.9,  platelets 296.  Sodium 142, potassium 3.7, glucose 92, BUN 4, creatinine  0.5.  Liver function tests normal.  First set of enzymes show CK 101, MB  1.6, troponin less than 0.01.  Second set CK 83, MB 1.4, troponin less than  0.01. Third set CK 73, MB 1.2, troponin less than 0.01.   DISCHARGE MEDICATIONS:  1. Lipitor 80 mg once a day.  2. Foltx two tablets a day.  3. Estratest once a day.  4. Enteric coated aspirin 325 mg once a day.  5. Singulair as before.  6. Nasonex two sprays each nostril two times a day.  7. Altace 2.5 mg once a day.  8. Prilosec 20 mg once a day.  9. Albuterol inhaler two puffs four times a day.  10.      Imdur 30 mg once a day as before.  11.      Nitroglycerin 0.4 mg sublingual as directed.  12.      Norvasc 2.5 mg once a day.  13.      She states that she already has Vioxx and anti-inflammatories at     home and have instructed her to use those over the next week.   DIET:  Low salt,  low fat, low cholesterol diet.    FOLLOWUP:  The patient will follow up with Dr. [name] January 25, 2002, at  11:45 a.m.  I have instructed her to call the office at [phone] if she has  further significant chest pain that she is concerned about.     Mary B. Remer Macho, P.A.-C.                   Runell Gess, M.D.     MBE/MEDQ  D:  01/04/2002  T:  01/08/2002  Job:  [id number]   cc:   Daleen Bo R. Felipa Eth, M.D.   Runell Gess, M.D.

## 2010-10-09 NOTE — Cardiovascular Report (Signed)
NAMEADAMARIE, [name] NO.:  [id number]   MEDICAL RECORD NO.:  [id number]          PATIENT TYPE:  INP   LOCATION:  6526                         FACILITY:  [facility]   PHYSICIAN:  [name], M.D.   DATE OF BIRTH:  [birth year]   DATE OF PROCEDURE:  07/14/2005  DATE OF DISCHARGE:                              CARDIAC CATHETERIZATION   HISTORY OF PRESENT ILLNESS:  Ms. Schriner is a 60 year old Native American  female with history of coronary artery disease status post coronary artery  bypass grafting x5 February 01, 1994. She has had left subclavian artery  PTA and stenting. She has had multiple catheterizations in the past. Has a  history of hyperlipidemia, some hypertension, and GERD. She is admitted  July 13, 2005 for unstable angina. She ruled out for myocardial  infarction. There are no acute EKG changes. She presents now for diagnostic  coronary angiography to rule out ischemic etiology.   DESCRIPTION OF PROCEDURE:  The patient was brought to the second floor [facility] cardiac catheterization lab in the appropriate altered stated.  She was premedicated with p.o. Valium, IV Versed, and Fentanyl. The right  groin was prepped and draped in the usual sterile fashion. Xylocaine 1% was  used for local anesthesia. A 6 French sheath was inserted into the right  femoral artery using standard Seldinger technique. A 6 French right and left  Judkins diagnostic catheter as well as a 6 French pigtail catheter were used  for selective coronary angiography, left ventriculography, selective I&A  angiography, selective vein graft angiography, and distal abdominal  aortography. Visipaque dye was used for the entirety of the case. Retrograde  aortic, left ventricular, and pull-back pressures.   HEMODYNAMICS:  1.  Aortic systolic pressure 115. Diastolic pressure 77.  2.  Left ventricular systolic pressure 160. End-diastolic pressure 14.   SELECTIVE CORONARY  ANGIOGRAPHY:  1.  Left main:  60-70% percent ostial/proximal stenosis.  2.  LAD:  70% sequential/segmental proximal mid 70% and 90% stenosis. There      was competitive flow in the first diagonal branch, which had 80%      segmental proximal stenosis.  3.  Left circumflex:  Occluded proximally.  4.  Right coronary artery:  Occluded at the genu.  5.  Vein graft to the PDA/POA, widely patent.  6.  Vein graft to the diagonal, ramus, and OM widely patent.  7.  Left subclavian artery stent widely patent with LIMA to the LAD, which      is widely patent.  8.  Left ventriculography:  RAO left ventriculogram was performed using 25      cc of Visipaque dye, 12 cc per second. The overall LD EF was estimated      at greater than 60% without focal wall motion abnormalities.  9.  Distal abdominal aortography:  Performed using 20 cc of Visipaque, 20 cc      per second. There was approximately 40% to 50% proximal left renal      artery stenosis. The intra-renal abdominal aorta had an iliac      bifurcation __________  IMPRESSION/PLAN:  Ms. Freedman has essentially patent grafts, normal left  ventricular function. I believe her chest pain is non-cardiac. She does have  a history of gastroesophageal reflux disease. I am going to get Dr. Victorino Dike, her gastroenterologist, to see her for further evaluation. The  sheath was removed and pressure was held in the groin to achieve hemostasis.  The patient left the lab in stable condition.      [name], M.D.  Electronically Signed     JB/MEDQ  D:  07/14/2005  T:  07/15/2005  Job:  [id number]   cc:   [facility]  [address]  [place]. [address]   Larina Earthly, M.D.  Fax: [phone]   Cardiac Catheterization Lab Second Floor [facility]

## 2010-10-09 NOTE — Procedures (Signed)
[facility]  Patient:    [name], [name] Visit Number: [id number] MRN: [id number]          Service Type: PMG Location: TPC Attending Physician:  Rolly Salter Dictated by:   Jewel Baize. Stevphen Rochester, M.D. Proc. Date: 03/14/02 Admit Date:  03/06/2001                             Procedure Report  INDICATIONS: 1. Shirleymae Hauth [facility] for scheduled transforaminal epidural injection.  We planned left transforaminal epidural injection at L5-S1 and L4-5.  The risks, complications, and options were fully outlined.  She will assess this within context of activities of daily living to predicate further injection based on need and responsiveness. 2. RS medical devices applied.  The technologist is present. 3. Review of medications: She is appropriate.  Objectively, she has diffuse paralumbar and myofascial discomfort, impaired flexion and extension, and lateral rotational pain.  Straight leg lifting impaired, left greater than right.  She has no new neurological features.  IMPRESSION:  Degenerative spinal disease, lumbar spine.  PLAN:  Lumbar epidural by transforaminal epidural approach to the left side. She is consented.  This will be with independent needle access points.  PROCEDURE:  The patient was taken to the fluoroscopy suite and placed in the prone position.  The back was prepped and draped in the usual fashion.  Using a 22-gauge spinal needle, I advanced to the left transforaminal epidural space at L5-S1 and L4-5, and I confirmed placement of multiple fluoroscopic positions.  This is at independent needle access points.  I used Isovue 200 to confirm placement.  I then injected and withdraw needle technique at each location, 2 cc of Lidocaine 1% NPF and 20 mg of Aristocort.  He tolerated this procedure well.  No complications from the injection. Discharge instructions given.  Extensive consultation after expectations  and potential risks to follow.  She tolerated it well.  She had a good diminution of pain perception.  I will see her in followup for second series. Dictated by:   Jewel Baize Stevphen Rochester, M.D. Attending Physician:  Rolly Salter DD:  03/14/01 TD:  03/15/01 Job: [id number]

## 2010-10-09 NOTE — Consult Note (Signed)
[facility]  Patient:    [name], [name] Visit Number: [id number] MRN: [id number]          Service Type: PMG Location: TPC Attending Physician:  Rolly Salter Dictated by:   Celene Kras, M.D. Proc. Date: 06/27/01 Admit Date:  06/09/2001   CC:         Julio Sicks, M.D.   Consultation Report  [name] [facility].  I evaluate and review her health and history form, 14 point review of systems.  1. Tayllor has been doing fairly well in the interim.  She has had no decline    in functional indices or advancing neurological symptoms.  The    transforaminal epidural did help her, and I think if she has any further    decline at this point, would either contemplate a neuroplasty or possibly a    stimulator. 2. We will go ahead and renew her Darvocet, as she uses this sparingly and    appropriately, and within the context of the patient care agreement. 3. Predicate any further injection on further decline.  Lifestyle enhancements    discussed such as cigarette cessation.  OBJECTIVE:  She has diffuse paralumbar myofascial discomfort, impaired flexion and extension, lateral rotational pain.  Neurologically intact, motor, sensory, reflex.  IMPRESSION:  Degenerative spinal disease of lumbar spine.  PLAN:  Conservative management.  Discharge instructions given.  We will see her in follow-up.  This will be at 1-2 months.  Instructed to maintain contact with Dr. Dalphine Handing office as well. Dictated by:   Celene Kras, M.D. Attending Physician:  Rolly Salter DD:  06/27/01 TD:  06/27/01 Job: [id number]

## 2010-10-09 NOTE — Consult Note (Signed)
[facility]  Patient:    [name], [name] Visit Number: [id number] MRN: [id number]          Service Type: PMG Location: TPC Attending Physician:  Rolly Salter Dictated by:   Jewel Baize. Stevphen Rochester, M.D. Admit Date:  03/06/2001   CC:         Julio Sicks, M.D.   Consultation Report  Graycen Degan [facility].  I evaluate and review health and history form, 14 point review of systems.  She has had approximately 75% diminution in pain perception, improved range of motion and functional indexes.  I do not believe further interventional procedure is warranted at this time.  Best outcome position discussed, inclusive of cigarette cessation, and she is attempting in this regard.  I will follow up with her in one month.  I would like her to return to Dr. Jordan Likes for further directive care.  Should she break through, would consider moving down the decision tree for further interventional procedures as directed through his office but at this time she is doing quite well, only occasionally taking a pain medication to control symptoms and relates no advancing neurological or musculoskeletal features.  OBJECTIVE  BACK:  She has diffuse paralumbar and myofascial discomfort.  Impaired flexion/extension, lateral rotational pain.  This is improved.  No new neurological features, motor, sensory, reflexia.  IMPRESSION:  Degenerative spinal disease, lumbar spine.  PLAN:  Conservative management.  Follow up in one to two months.  Should she continue to improve, we will just see her on a p.r.n. basis as discussed with the patient.  Return to Dr. Jordan Likes. Dictated by:   Jewel Baize Stevphen Rochester, M.D. Attending Physician:  Rolly Salter DD:  04/11/01 TD:  04/11/01 Job: [id number]

## 2010-10-09 NOTE — H&P (Signed)
NAME:  [name], [name]                         ACCOUNT NO.:  [id number]   MEDICAL RECORD NO.:  [id number]                   PATIENT TYPE:  INP   LOCATION:  2005                                 FACILITY:  [facility]   PHYSICIAN:  Aram Candela. Tysinger, M.D.              DATE OF BIRTH:  27-Jan-1951   DATE OF ADMISSION:  06/08/2002  DATE OF DISCHARGE:  06/09/2002                                HISTORY & PHYSICAL   CHIEF COMPLAINT:  Chest pain.   HISTORY OF THE PRESENT ILLNESS:  This is a 60 year old female patient of Dr.  [name] with a history of coronary artery disease, who began to have  chest pain at about noon today.  It was severe and in her left chest,  radiating to her left arm and caused nausea and shortness of breath as well  as diaphoresis.  She was brought to the emergency department by her family  and has had nitroglycerin and morphine; the morphine helped just a little  bit but really has had no sustained pain relief.  She can have angina  several times a week and generally it is relieved by nitroglycerin.  She  will be admitted to telemetry.   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  On her last cardiac  catheterization, she was shown to have an ejection fraction of 50%.  Her  coronary artery bypass grafting was done on February 01, 1994 and  variously, her old records list that as being x5 or x7.  She has also had a  percutaneous coronary angioplasty with stenting in her mid right coronary  artery on August 29, 2000.  She has had a left subclavian stent and also  breast biopsies which were negative and she is status post hysterectomy.   DRUG ALLERGIES:  The patient has no known drug  allergies.   MEDICATIONS:  1. Enteric-coated aspirin 325 mg one every day.  2. Altace 2.5 mg one every day.  3. Foltx two every day.  4. Prilosec 20 mg one every day.  5. Estratest one every day.  6. Isosorbide 30 mg one every day.  7. Nasonex 50 mg two sprays each nostril bilaterally twice a day.  8. Albuterol 17 mg two puffs four times a day.  9. Nitroglycerin 0.4 mg one sublingually as needed.   FAMILY HISTORY:  The patient's family history is positive for heart disease,  lung disease, cancer, diabetes mellitus and hypertension.   SOCIAL HISTORY:  The patient is married.  She does not use alcohol.  She  does not smoke.  She does not abuse drugs.  She does use caffeine.  She is  right-handed.   REVIEW OF SYSTEMS:  CONSTITUTIONAL:  The  patient denies fevers but she has  had chills and sweats.  Her weight is about the same as usual.  She has some  ankle edema but she sleeps fair.  EYES:  The patient denies diplopia,  blurring, contacts, glaucoma and cataracts.  She uses reading glasses.  EARS, NOSE, MOUTH AND THROAT:  The patient denies deafness or tinnitus,  dysgeusia, sores in her mouth and dentures.  She does have some rhinorrhea  and occasionally sneezes.  CARDIOVASCULAR:  The patient has chest pain as  noted.  She occasionally has racing palpitations.  She had shortness of  breath with her chest pain today.  She does not have paroxysmal nocturnal  dyspnea or orthopnea.  She does have some claudication.  RESPIRATORY:  The  patient has had a cough since Thanksgiving, not productive of sputum.  She  wheezes a lot.  She does not smoke.  She snores sometimes.  GASTROINTESTINAL:  The patient denies dysphasia, diarrhea or constipation.  She had nausea today with her chest pain.  She has indigestion at times.  GENITOURINARY:  The patient denies dysuria, pyuria, hematuria, anuria,  hesitation, frequency.  She has nocturia x4 and she is post hysterectomy.  MUSCULOSKELETAL:  The patient  has low back pain.  She denies myalgias or  falls.  She does have fatigue and weakness.  Her gait is steady.  SKIN:  The  patient denies rashes or other skin problems.  BREASTS:  The patient denies  masses, lumps, tenderness or discharge of the breasts.  NEUROLOGIC:  The  patient denies faintness, syncope, dizziness, numbness, seizures, the signs  and symptoms of a stroke but she does have headaches.  PSYCHIATRIC:  The  patient denies depression, anorexia and hallucinations, but she does have  some stress and anxiety.  ENDOCRINE:  The patient denies thyroid disease,  diabetes mellitus, excessive thirst, excessive hunger and excessive urine  volume output.  HEMATOLOGIC:  The patient denies bruising or bleeding  easily.  LYMPHATIC:  The patient denies adenopathy of the neck, axillae and  groin.  ALLERGIC:  The patient has no known drug allergies and denies any  non-drug allergies.  ALL OTHER SYSTEMS:  All other systems are negative.   PHYSICAL EXAMINATION:  VITAL SIGNS:  The patient's temperature is 96.9  degrees orally, her pulse is 58, her respirations 16, her blood pressure  95/47.  Her weight is 120 pounds, her height 5 feet 6 inches.  Her O2  saturation is 95%.  Her age is 60.  GENERAL:  The patient is a well-developed, well-nourished female patient in  no acute distress.  PSYCHIATRIC:  The patient has a flat and distant affect but she is  cooperative and appropriate.  HEENT:  The patient's pupils are equal, round, reactive to light and  accommodate.  They are 2 mm in diameter.  Extraocular movements are intact.  She is normocephalic, atraumatic.  Her mouth is moist.  Her oropharynx is  benign.  NECK:  The patient's neck is supple with a midline trachea.  She is without  jugular venous distention, bruit, thyromegaly or hepatojugular reflux.  CHEST:  The patient's chest is clear to auscultation and percussion.  She has a thoracotomy scar down the center of her chest and she is  eupneic.  BREASTS:  The patient's breasts are of normal contour without discharge or  tenderness.  ADENOPATHY:  The patient is without cervical adenopathy.  CARDIAC:  The patient has a regular rate and rhythm.  S1 and S2  are clearly  heard.  She is without murmurs, gallops, rubs or clicks appreciated.  ABDOMEN:  The patient has positive bowel sounds.  Her abdomen is soft and  nontender.  She is not obese and not distended.  GU:  The patient is post hysterectomy and nontender over her bladder.  EXTREMITIES:  The patient moves all extremities x4.  Her strength is 5/5 in  her upper and lower extremities.  She is without ankle edema.  SKIN:  The patient's skin is warm and dry without jaundice, cyanosis, pallor  or rashes.  She has a brisk capillary refill.  NEUROLOGIC:  The patient is conscious, alert and oriented to person, place,  time and situation.  Cranial nerve II-XII are grossly intact.   LABORATORIES AND X-RAYS:  The patient's sodium is 141, her potassium 3.6,  her chloride 107, her CO2 26, her BUN 7, her creatinine 0.8, her blood  glucose 101, her alkaline phosphatase is high at 123.  Her PT is 13.3, her  INR is 1.0, her APPT is 37.  Her CK is 100, her MB is 1.5, her index is 1.5.  Her troponin I is 0.01.  Her white blood cell count is 8.6, her hemoglobin  is 15.0, her hematocrit is 43.0, her platelets are 273,000; her basophils  are 2%, her absolute basophils are 0.2.   Her chest x-ray shows no acute disease, status post thoracotomy.   Her EKG shows normal sinus rhythm at a rate of 64.   IMPRESSION:  1. Chest pain.  2. Coronary artery disease.  3. Hypertension.  4. Hyperlipidemia.  5. Gastroesophageal reflux disease.  6. Chronic obstructive pulmonary disease.   PLAN:  1. Admit to telemetry.  2. Admission labs and chest x-rays and EKG.  3. Heparin by pharmacy protocol.  4. Home medications.  5. Nitroglycerin drip.  6. Analgesia.     Arletha Pili. Dahlia Client. Aleen Campi, M.D.    LMK/MEDQ  D:  06/08/2002  T:  06/09/2002  Job:  [id number]

## 2010-10-29 ENCOUNTER — Ambulatory Visit (HOSPITAL_COMMUNITY)
Admission: RE | Admit: 2010-10-29 | Discharge: 2010-10-29 | Disposition: A | Discharge status: Home / Self Care | Payer: Medicaid Other | Source: Ambulatory Visit | Attending: Orthopedic Surgery | Admitting: Orthopedic Surgery

## 2010-10-29 DIAGNOSIS — M7989 Other specified soft tissue disorders: Secondary | ICD-10-CM

## 2010-10-29 DIAGNOSIS — M79609 Pain in unspecified limb: Secondary | ICD-10-CM | POA: Insufficient documentation

## 2010-11-09 ENCOUNTER — Telehealth: Payer: Self-pay | Admitting: Internal Medicine

## 2010-11-09 MED ORDER — FLUCONAZOLE 100 MG PO TABS: 100.0000 mg | ORAL_TABLET | Freq: Every day | ORAL | Status: AC

## 2010-11-09 NOTE — Telephone Encounter (Signed)
Pt c/o white sores on tongue, throat, and sides of mouth with pain x 3 days. Pt states she brushes her teeth three times a day, also rinse, gargles, and spit after she uses her inhalers. States MW has written Diflucan x 5 days in the past with mouthwash and that has been the only thing that gives her relief. Will send to "doc of the day" for review. Please advise. Thanks.

## 2010-11-09 NOTE — Telephone Encounter (Signed)
It could be yeast infection from steroids, Would use Magic mouthwash 5cc swish and swallow, #1,  is cannot get an answer from her PCP.

## 2010-11-09 NOTE — Telephone Encounter (Signed)
Ok for diflucan as before

## 2010-11-09 NOTE — Telephone Encounter (Signed)
Rx was sent to pharm- diflucan 100 mg daily x 5 days- Spoke with pt and notified her this has been taken care of.

## 2010-11-09 NOTE — Telephone Encounter (Signed)
Patient states she is using inhalers and now has "white sores in my mouth." Patient instructed to call the MD that prescribes the inhalers to treat this.

## 2010-11-10 NOTE — Telephone Encounter (Addendum)
lmom for pt to call back. There is a note from Dr Thurston Hole office - they ordered Diflucan.

## 2010-11-11 ENCOUNTER — Telehealth: Payer: Self-pay | Admitting: *Deleted

## 2010-11-11 NOTE — Telephone Encounter (Signed)
Pt returned by call and I informed her, Dr [name] was going to order MM, but I saw where Dr Mellody Drown ordered Diflucan and that will take care of her mouth. Pt is wearing a Fentanyl patch since recently having back surgery. She reports constipation and is taking 2 stool softeners bid. Advise her Miralax daily is ok to take. Pt stated understanding.

## 2010-12-14 ENCOUNTER — Telehealth: Payer: Self-pay | Admitting: Internal Medicine

## 2010-12-14 MED ORDER — FLUCONAZOLE 100 MG PO TABS: 100.0000 mg | ORAL_TABLET | Freq: Every day | ORAL | Status: AC

## 2010-12-14 NOTE — Telephone Encounter (Signed)
Ok diflucan 100  Mg daily x 3 days then ov to regroup, bring all meds

## 2010-12-14 NOTE — Telephone Encounter (Signed)
Pt aware of recs. Rx sent and appt set for 12-16-10. Carron Curie, CMA

## 2010-12-14 NOTE — Telephone Encounter (Signed)
Called and spoke with pt.  Pt states she has thrush again.  States she is rinsing mouth out after using Symbicort but has developed "painful sores in mouth".  Sores are white and on her tongue, gums, throat and cheeks.  Requesting rx for Diflucan tabs.  MW, please advise.  Thanks.

## 2010-12-16 ENCOUNTER — Ambulatory Visit (INDEPENDENT_AMBULATORY_CARE_PROVIDER_SITE_OTHER): Payer: Medicaid Other | Admitting: Internal Medicine

## 2010-12-16 ENCOUNTER — Encounter: Payer: Self-pay | Admitting: Internal Medicine

## 2010-12-16 VITALS — BP 100/62 | HR 71 | Temp 98.0°F | Ht 66.0 in | Wt 108.2 lb

## 2010-12-16 DIAGNOSIS — J449 Chronic obstructive pulmonary disease, unspecified: Secondary | ICD-10-CM

## 2010-12-16 DIAGNOSIS — J4489 Other specified chronic obstructive pulmonary disease: Secondary | ICD-10-CM

## 2010-12-16 NOTE — Patient Instructions (Addendum)
Take prilosec Take 30- 60 min before your first and last meals of the day   GERD (REFLUX)  is an extremely common cause of respiratory symptoms, many times with no significant heartburn at all.    It can be treated with medication, but also with lifestyle changes including avoidance of late meals, excessive alcohol, smoking cessation, and avoid fatty foods, chocolate, peppermint, colas, red wine, and acidic juices such as orange juice.  NO MINT OR MENTHOL PRODUCTS SO NO COUGH DROPS  USE SUGARLESS CANDY INSTEAD (jolley ranchers or Stover's)  NO OIL BASED VITAMINS   Try using  the spacer with symbicort  Work on perfecting your inhaler technique:  relax and gently blow all the way out then take a nice smooth deep breath back in, triggering the inhaler at same time you start breathing in.  Hold for up to 5 seconds if you can.  Rinse and gargle with water when done   If your mouth or throat starts to bother you,   I suggest you time the inhaler to your dental care and after using the inhaler(s) brush teeth and tongue with a baking soda containing toothpaste and when you rinse this out, gargle with it first to see if this helps your mouth and throat.     Please schedule a follow up office visit in 6 weeks, call sooner if needed - bring spacer with you 

## 2010-12-16 NOTE — Assessment & Plan Note (Signed)
GOLD II with prominent upper airway symptoms and poor use of hfa  See instructions for specific recommendations which were reviewed directly with the patient who was given a copy with highlighter outlining the key components.   The proper method of use, as well as anticipated side effects, of this metered-dose inhaler are discussed and demonstrated to the patient. Improved to 75% with coaching   ? Also acid related upper airway symptoms >  Add ppi short term

## 2010-12-16 NOTE — Progress Notes (Deleted)
dd

## 2010-12-16 NOTE — Progress Notes (Signed)
Subjective:     Patient ID: [name], female   DOB: [birth year], 60 y.o.   MRN: [id number]  HPI  32 yowf quit smoking January 2011 when admit for heart problems by Dr [name].  March 25, 2010 cc doe overall better than [facility] but can't get from one room to next despite using spiriva daily and proventil hfa twice daily. wakes up every day with very congested cough> mucoid thick sputum and can't get clear until uses am proventil but not typically waking up at night with resp issues. rec symbicort 160 2 puffs first thing in am and 2 puffs again in pm about 12 hours later  Change nexium to Take one 30-60 min before first and last meals of the day  mucinex as needed for cough, take per bottle GERD diet    April 29, 2010 ov Dyspnea-somewhat improved, overall satisfied with less sob, less cough and no need for saba.  rec no change rx  12/16/2010 ov/[name] cc thrush recurrent on symbicort rx with diflucan still poor technique, has spacer but not using  Pt denies any significant sore throat, dysphagia, itching, sneezing,  nasal congestion or excess/ purulent secretions,  fever, chills, sweats, unintended wt loss, pleuritic or exertional cp, hempoptysis, orthopnea pnd or leg swelling.    Also denies any obvious fluctuation of symptoms with weather or environmental changes or other aggravating or alleviating factors.  Sleeping ok without nocturnal  or early am exac of resp c/o's or need for noct saba.           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)  HEMORRHOIDS (ICD-455.6)  POLYP, COLON (ICD-211.3)  ESOPHAGEAL STRICTURE (ICD-530.3)  HIATAL HERNIA (ICD-553.3)  GASTRITIS, CHRONIC (ICD-535.10)  CAD (ICD-414.00)  PERIPHERAL NEUROPATHY (ICD-356.9)  HYPERLIPIDEMIA (ICD-272.4)  PEPTIC ULCER DISEASE (ICD-533.90)  ATHEROSCLEROTIC CARDIOVASCULAR DISEASE (ICD-429.2)  DEPRESSION (ICD-311)  ANXIETY (ICD-300.00)    COPD..................................................................Marland KitchenWert  - PFt's 06/15/10 GOLD II see overview GERD (ICD-530.81)      Review of Systems     Objective:   Physical Exam     thin amb wf with peculiar affect and minimal congested sounding cough  wt 115 March 25, 2010 > 115 December 7 , 2011 >  108 12/16/2010  HEENT mild turbinate edema. Oropharynx no thrush or excess pnd or cobblestoning. No JVD or cervical adenopathy. Mild accessory muscle hypertrophy. Trachea midline, nl thryroid. Chest was hyperinflated by percussion with diminished breath sounds and moderate increased exp time without wheeze. Hoover sign positive at mid inspiration. Regular rate and rhythm without murmur gallop or rub or increase P2 or edema. Abd: no hsm, nl excursion. Ext warm without cyanosis or clubbing. Assessment:         Plan:

## 2011-01-01 ENCOUNTER — Telehealth: Payer: Self-pay | Admitting: Internal Medicine

## 2011-01-01 MED ORDER — NYSTATIN 100000 UNIT/ML MT SUSP: 5.0000 mL | Freq: Three times a day (TID) | OROMUCOSAL | Status: AC

## 2011-01-01 NOTE — Telephone Encounter (Signed)
Script for Nystatin swish and spit sent to her pharmacy. Have her call next week to let us know how she is doing.

## 2011-01-01 NOTE — Telephone Encounter (Signed)
Called, spoke with pt who c/o white sores in mouth x 2 days.  States she is using the spacer with the symbicort.  After each use brushing with baking soda toothpaste.  She is requesting rx for this.  MW out of office today.  RB, pls advise.  Thanks!  [facility] Allergies verified  Allergies  Allergen Reactions  . Amiodarone   . Clopidogrel Bisulfate     REACTION: hives

## 2011-01-01 NOTE — Telephone Encounter (Signed)
Called, spoke with pt.  She is aware rx for nystatin swish and spit sent to pharmacy.  She is to use 5 mLs tid and call next week to let us know how she is doing.  She verbalized understanding of these instructions.

## 2011-01-08 ENCOUNTER — Telehealth: Payer: Self-pay | Admitting: Internal Medicine

## 2011-01-08 NOTE — Telephone Encounter (Signed)
Returning call.  [phone]

## 2011-01-08 NOTE — Telephone Encounter (Signed)
lmomtcb x1 

## 2011-01-08 NOTE — Telephone Encounter (Signed)
Pt returned call of triage nurse.  [name]

## 2011-01-08 NOTE — Telephone Encounter (Signed)
ATC x2.  Line busy.  WCB. 

## 2011-01-11 MED ORDER — FLUCONAZOLE 100 MG PO TABS: 100.0000 mg | ORAL_TABLET | Freq: Every day | ORAL | Status: AC

## 2011-01-11 NOTE — Telephone Encounter (Signed)
Pt returning call from last week can be reached at [phone].Raylene Everts

## 2011-01-11 NOTE — Telephone Encounter (Signed)
Called, spoke with pt.  She is aware MW recs she take diflucan 100mg  x 3 days and then come in for OV with all of her meds, inhalers, spacers with her to discuss this in more detail because it is recurrent.  She verbalized understanding of this and aware rx sent to [facility].  ROV scheduled for Aug 27 at 10am with MW -- pt aware.

## 2011-01-11 NOTE — Telephone Encounter (Addendum)
Pt c/o oral thrush. She says there are white patches on her tongue and the back of her throat. She also says her mouth feels sore and raw. She has been using the Nystatin but it is not helping. She did have a few days of relief back in July when she took Diflucan for 3 days but the thrush came right back. She says she is rinsing her mouth several times a day and uses a spacer with the Symbicort. Pls advise. Pls call pt back at cell # [phone].  Allergies  Allergen Reactions  . Amiodarone   . Clopidogrel Bisulfate     REACTION: hives

## 2011-01-11 NOTE — Telephone Encounter (Signed)
Ok to do diflucan 100 mg x 3 days then ov with all meds/ inhalers/spacers in hand to go over this issue in more detail since it is recurrent

## 2011-01-18 ENCOUNTER — Encounter: Payer: Medicaid Other | Admitting: Internal Medicine

## 2011-01-18 NOTE — Patient Instructions (Signed)
Take prilosec Take 30- 60 min before your first and last meals of the day   GERD (REFLUX)  is an extremely common cause of respiratory symptoms, many times with no significant heartburn at all.    It can be treated with medication, but also with lifestyle changes including avoidance of late meals, excessive alcohol, smoking cessation, and avoid fatty foods, chocolate, peppermint, colas, red wine, and acidic juices such as orange juice.  NO MINT OR MENTHOL PRODUCTS SO NO COUGH DROPS  USE SUGARLESS CANDY INSTEAD (jolley ranchers or Stover's)  NO OIL BASED VITAMINS   Try using  the spacer with symbicort  Work on perfecting your inhaler technique:  relax and gently blow all the way out then take a nice smooth deep breath back in, triggering the inhaler at same time you start breathing in.  Hold for up to 5 seconds if you can.  Rinse and gargle with water when done   If your mouth or throat starts to bother you,   I suggest you time the inhaler to your dental care and after using the inhaler(s) brush teeth and tongue with a baking soda containing toothpaste and when you rinse this out, gargle with it first to see if this helps your mouth and throat.     Please schedule a follow up office visit in 6 weeks, call sooner if needed - bring spacer with you

## 2011-01-18 NOTE — Progress Notes (Signed)
Subjective:     Patient ID: [name], female   DOB: June 18, 1950, 60 y.o.   MRN: [id number]  HPI  89 yowf quit smoking January 2011 when admit for heart problems by Dr [name].  March 25, 2010 cc doe overall better than [facility] but can't get from one room to next despite using spiriva daily and proventil hfa twice daily. wakes up every day with very congested cough> mucoid thick sputum and can't get clear until uses am proventil but not typically waking up at night with resp issues. rec symbicort 160 2 puffs first thing in am and 2 puffs again in pm about 12 hours later  Change nexium to Take one 30-60 min before first and last meals of the day  mucinex as needed for cough, take per bottle GERD diet    April 29, 2010 ov Dyspnea-somewhat improved, overall satisfied with less sob, less cough and no need for saba.  rec no change rx  12/16/2010 ov/Wert cc thrush recurrent on symbicort rx with diflucan still poor technique, has spacer but not using Take prilosec Take 30- 60 min before your first and last meals of the day   GERD (REFLUX) diet   Try using  the spacer with symbicort  Work on perfecting your inhaler technique   01/18/2011 f/u ov/Wert cc   Pt denies any significant sore throat, dysphagia, itching, sneezing,  nasal congestion or excess/ purulent secretions,  fever, chills, sweats, unintended wt loss, pleuritic or exertional cp, hempoptysis, orthopnea pnd or leg swelling.    Also denies any obvious fluctuation of symptoms with weather or environmental changes or other aggravating or alleviating factors.  Sleeping ok without nocturnal  or early am exac of resp c/o's or need for noct saba.           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)  HEMORRHOIDS (ICD-455.6)  POLYP, COLON (ICD-211.3)  ESOPHAGEAL STRICTURE (ICD-530.3)  HIATAL HERNIA (ICD-553.3)  GASTRITIS, CHRONIC (ICD-535.10)  CAD  (ICD-414.00)  PERIPHERAL NEUROPATHY (ICD-356.9)  HYPERLIPIDEMIA (ICD-272.4)  PEPTIC ULCER DISEASE (ICD-533.90)  ATHEROSCLEROTIC CARDIOVASCULAR DISEASE (ICD-429.2)  DEPRESSION (ICD-311)  ANXIETY (ICD-300.00)  COPD..................................................................Marland KitchenWert  - PFt's 06/15/10 GOLD II see overview GERD (ICD-530.81)      Review of Systems     Objective:   Physical Exam     thin amb wf with peculiar affect and minimal congested sounding cough  wt 115 March 25, 2010 > 115 December 7 , 2011 >  108 12/16/2010 > 01/18/2011  HEENT mild turbinate edema. Oropharynx no thrush or excess pnd or cobblestoning. No JVD or cervical adenopathy. Mild accessory muscle hypertrophy. Trachea midline, nl thryroid. Chest was hyperinflated by percussion with diminished breath sounds and moderate increased exp time without wheeze. Hoover sign positive at mid inspiration. Regular rate and rhythm without murmur gallop or rub or increase P2 or edema. Abd: no hsm, nl excursion. Ext warm without cyanosis or clubbing. Assessment:         Plan:

## 2011-02-01 ENCOUNTER — Ambulatory Visit: Payer: Medicaid Other | Admitting: Internal Medicine

## 2011-02-15 LAB — URINALYSIS, ROUTINE W REFLEX MICROSCOPIC
Bilirubin Urine: NEGATIVE
Ketones, ur: NEGATIVE
Nitrite: NEGATIVE
Protein, ur: NEGATIVE
Urobilinogen, UA: 0.2
pH: 6.5

## 2011-02-15 LAB — BASIC METABOLIC PANEL
BUN: 1 — ABNORMAL LOW
Calcium: 9.6
Creatinine, Ser: 0.57
GFR calc non Af Amer: >60
Glucose, Bld: 89

## 2011-02-24 LAB — RAPID STREP SCREEN (MED CTR MEBANE ONLY): Streptococcus, Group A Screen (Direct): NEGATIVE

## 2011-02-26 ENCOUNTER — Ambulatory Visit: Payer: Medicaid Other | Admitting: Internal Medicine

## 2011-02-26 LAB — DIFFERENTIAL
Lymphocytes Relative: 24
Lymphs Abs: 1.9
Monocytes Relative: 6
Neutrophils Relative %: 68

## 2011-02-26 LAB — CBC
MCHC: 33.3
MCV: 97.3
RBC: 3.86 — ABNORMAL LOW
RDW: 15.9 — ABNORMAL HIGH

## 2011-02-26 LAB — URINALYSIS, ROUTINE W REFLEX MICROSCOPIC
Bilirubin Urine: NEGATIVE
Hgb urine dipstick: NEGATIVE
Ketones, ur: NEGATIVE
Nitrite: NEGATIVE
Urobilinogen, UA: 0.2

## 2011-02-26 LAB — CARDIAC PANEL(CRET KIN+CKTOT+MB+TROPI)
CK, MB: 0.9
CK, MB: 0.9
Relative Index: INVALID
Relative Index: INVALID
Total CK: 56
Total CK: 67
Troponin I: 0.02
Troponin I: 0.03

## 2011-02-26 LAB — POCT CARDIAC MARKERS
CKMB, poc: <1 — ABNORMAL LOW
CKMB, poc: <1 — ABNORMAL LOW
Myoglobin, poc: 109
Operator id: 234501
Troponin i, poc: <0.05
Troponin i, poc: <0.05

## 2011-02-26 LAB — COMPREHENSIVE METABOLIC PANEL
ALT: 12
AST: 15
CO2: 25
Calcium: 9
Creatinine, Ser: 0.42
GFR calc Af Amer: >60
GFR calc non Af Amer: >60
Glucose, Bld: 98
Sodium: 140
Total Protein: 5.4 — ABNORMAL LOW

## 2011-02-26 LAB — D-DIMER, QUANTITATIVE: D-Dimer, Quant: 0.4

## 2011-02-26 LAB — TSH: TSH: 0.725

## 2011-02-26 NOTE — Progress Notes (Signed)
Subjective:     Patient ID: [name], female   DOB: 1950-06-28, 60 y.o.   MRN: [id number]  HPI  11 yowf quit smoking January 2011 when admit for heart problems by Dr [name].  March 25, 2010 cc doe overall better than [facility] but can't get from one room to next despite using spiriva daily and proventil hfa twice daily. wakes up every day with very congested cough> mucoid thick sputum and can't get clear until uses am proventil but not typically waking up at night with resp issues. rec symbicort 160 2 puffs first thing in am and 2 puffs again in pm about 12 hours later  Change nexium to Take one 30-60 min before first and last meals of the day  mucinex as needed for cough, take per bottle GERD diet    April 29, 2010 ov Dyspnea-somewhat improved, overall satisfied with less sob, less cough and no need for saba.  rec no change rx  12/16/2010 ov/Wert cc thrush recurrent on symbicort rx with diflucan still poor technique, has spacer but not using Take prilosec Take 30- 60 min before your first and last meals of the day   GERD (REFLUX) diet   Try using  the spacer with symbicort  Work on perfecting your inhaler technique   02/26/2011 f/u ov/Wert cc   Pt denies any significant sore throat, dysphagia, itching, sneezing,  nasal congestion or excess/ purulent secretions,  fever, chills, sweats, unintended wt loss, pleuritic or exertional cp, hempoptysis, orthopnea pnd or leg swelling.    Also denies any obvious fluctuation of symptoms with weather or environmental changes or other aggravating or alleviating factors.  Sleeping ok without nocturnal  or early am exac of resp c/o's or need for noct saba.           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)  HEMORRHOIDS (ICD-455.6)  POLYP, COLON (ICD-211.3)  ESOPHAGEAL STRICTURE (ICD-530.3)  HIATAL HERNIA (ICD-553.3)  GASTRITIS, CHRONIC (ICD-535.10)  CAD  (ICD-414.00)  PERIPHERAL NEUROPATHY (ICD-356.9)  HYPERLIPIDEMIA (ICD-272.4)  PEPTIC ULCER DISEASE (ICD-533.90)  ATHEROSCLEROTIC CARDIOVASCULAR DISEASE (ICD-429.2)  DEPRESSION (ICD-311)  ANXIETY (ICD-300.00)  COPD..................................................................Marland KitchenWert  - PFt's 06/15/10 GOLD II see overview GERD (ICD-530.81)      Review of Systems     Objective:   Physical Exam     thin amb wf with peculiar affect and minimal congested sounding cough  wt 115 March 25, 2010 > 115 December 7 , 2011 >  108 12/16/2010 > 02/26/2011  HEENT mild turbinate edema. Oropharynx no thrush or excess pnd or cobblestoning. No JVD or cervical adenopathy. Mild accessory muscle hypertrophy. Trachea midline, nl thryroid. Chest was hyperinflated by percussion with diminished breath sounds and moderate increased exp time without wheeze. Hoover sign positive at mid inspiration. Regular rate and rhythm without murmur gallop or rub or increase P2 or edema. Abd: no hsm, nl excursion. Ext warm without cyanosis or clubbing. Assessment:         Plan:

## 2011-03-01 LAB — URINE CULTURE: Colony Count: >=100000

## 2011-03-01 LAB — DIFFERENTIAL
Basophils Relative: 0
Basophils Relative: 0
Eosinophils Absolute: 0 — ABNORMAL LOW
Eosinophils Absolute: 0 — ABNORMAL LOW
Eosinophils Absolute: 0.1 — ABNORMAL LOW
Eosinophils Relative: 0
Eosinophils Relative: 1
Lymphs Abs: 1.4
Lymphs Abs: 1.6
Lymphs Abs: 1.8
Monocytes Relative: 4
Monocytes Relative: 6
Monocytes Relative: 7
Neutrophils Relative %: 66
Neutrophils Relative %: 68

## 2011-03-01 LAB — I-STAT 8, (EC8 V) (CONVERTED LAB)
BUN: 7
Chloride: 100
Chloride: 105
Glucose, Bld: 82
Glucose, Bld: 94
Hemoglobin: 11.9 — ABNORMAL LOW
Potassium: 3.8
Potassium: 3.8
Sodium: 137
TCO2: 32
pH, Ven: 7.351 — ABNORMAL HIGH
pH, Ven: 7.417 — ABNORMAL HIGH

## 2011-03-01 LAB — BASIC METABOLIC PANEL
Chloride: 106
Creatinine, Ser: 0.58
GFR calc Af Amer: >60
Potassium: 3.8
Sodium: 137

## 2011-03-01 LAB — CBC
HCT: 32.3 — ABNORMAL LOW
HCT: 35.6 — ABNORMAL LOW
Hemoglobin: 11.3 — ABNORMAL LOW
MCHC: 33.7
MCHC: 34.5
MCV: 94
MCV: 96
MCV: 96.7
RBC: 3.44 — ABNORMAL LOW
RBC: 3.47 — ABNORMAL LOW
RBC: 3.71 — ABNORMAL LOW
WBC: 6.6
WBC: 7.2
WBC: 9.1

## 2011-03-01 LAB — URINALYSIS, ROUTINE W REFLEX MICROSCOPIC
Bilirubin Urine: NEGATIVE
Bilirubin Urine: NEGATIVE
Glucose, UA: NEGATIVE
Hgb urine dipstick: NEGATIVE
Ketones, ur: 15 — AB
Ketones, ur: NEGATIVE
Nitrite: POSITIVE — AB
Protein, ur: NEGATIVE
Specific Gravity, Urine: 1.017
Urobilinogen, UA: 0.2
pH: 5.5

## 2011-03-01 LAB — URINE MICROSCOPIC-ADD ON

## 2011-03-01 LAB — POCT I-STAT CREATININE: Operator id: 282201

## 2011-03-02 LAB — CBC
HCT: 24.9 — ABNORMAL LOW
HCT: 25.6 — ABNORMAL LOW
HCT: 26.1 — ABNORMAL LOW
HCT: 34.6 — ABNORMAL LOW
HCT: 46.4 — ABNORMAL HIGH
Hemoglobin: 11.6 — ABNORMAL LOW
Hemoglobin: 15.9 — ABNORMAL HIGH
Hemoglobin: 8.1 — ABNORMAL LOW
Hemoglobin: 8.4 — ABNORMAL LOW
Hemoglobin: 8.7 — ABNORMAL LOW
Hemoglobin: 8.8 — ABNORMAL LOW
Hemoglobin: 9.2 — ABNORMAL LOW
Hemoglobin: 13.5
MCHC: 33.6
MCHC: 34.1
MCHC: 34.2
MCHC: 34.5
MCHC: 34.5
MCHC: 35.5
MCV: 94.9
MCV: 95.2
MCV: 96.1
MCV: 96.4
Platelets: 154
Platelets: 169
Platelets: 221
RBC: 2.45 — ABNORMAL LOW
RBC: 2.58 — ABNORMAL LOW
RBC: 2.69 — ABNORMAL LOW
RBC: 2.7 — ABNORMAL LOW
RBC: 3.64 — ABNORMAL LOW
RBC: 4.8
RBC: 4.14
RDW: 12.7
RDW: 13
RDW: 13.1
RDW: 13.2
RDW: 13.6
RDW: 13.9
WBC: 10.7 — ABNORMAL HIGH
WBC: 13.5 — ABNORMAL HIGH
WBC: 14.2 — ABNORMAL HIGH
WBC: 7.2
WBC: 8.9
WBC: 8.6

## 2011-03-02 LAB — BASIC METABOLIC PANEL
BUN: 1 — ABNORMAL LOW
CO2: 28
CO2: 29
Calcium: 8.5
Chloride: 103
GFR calc Af Amer: >60
GFR calc non Af Amer: >60
GFR calc non Af Amer: >60
Glucose, Bld: 88
Glucose, Bld: 96
Potassium: 4.1
Sodium: 138
Sodium: 139

## 2011-03-02 LAB — DIFFERENTIAL
Basophils Absolute: 0.7 — ABNORMAL HIGH
Basophils Relative: 7 — ABNORMAL HIGH
Eosinophils Absolute: 0 — ABNORMAL LOW
Eosinophils Relative: 0
Eosinophils Relative: 1
Lymphocytes Relative: 27
Lymphs Abs: 2.5
Lymphs Abs: 1.7
Monocytes Absolute: 0.5
Monocytes Absolute: 1
Monocytes Absolute: 0.3
Monocytes Relative: 10
Monocytes Relative: 4
Neutro Abs: 6.5
Neutrophils Relative %: 76

## 2011-03-02 LAB — URINALYSIS, ROUTINE W REFLEX MICROSCOPIC
Glucose, UA: NEGATIVE
Hgb urine dipstick: NEGATIVE
Hgb urine dipstick: NEGATIVE
Nitrite: NEGATIVE
Specific Gravity, Urine: 1.008
Specific Gravity, Urine: 1.019
Urobilinogen, UA: 0.2
pH: 5.5

## 2011-03-02 LAB — POCT CARDIAC MARKERS
CKMB, poc: <1 — ABNORMAL LOW
CKMB, poc: <1 — ABNORMAL LOW
CKMB, poc: <1 — ABNORMAL LOW
Operator id: 198171
Troponin i, poc: <0.05

## 2011-03-02 LAB — I-STAT 8, (EC8 V) (CONVERTED LAB)
BUN: 3 — ABNORMAL LOW
Chloride: 108
Chloride: 108
Glucose, Bld: 86
Hemoglobin: 13.6
Hemoglobin: 13.9
Operator id: 282201
Potassium: 3.8
Potassium: 4.5
Sodium: 138
Sodium: 139
TCO2: 28

## 2011-03-02 LAB — POCT I-STAT CREATININE
Creatinine, Ser: 0.7
Operator id: 282201

## 2011-03-02 LAB — POCT I-STAT 4, (NA,K, GLUC, HGB,HCT)
Glucose, Bld: 108 — ABNORMAL HIGH
Potassium: 4.5
Sodium: 140

## 2011-03-02 LAB — APTT: aPTT: 32

## 2011-03-02 LAB — TYPE AND SCREEN: ABO/RH(D): B POS

## 2011-03-02 LAB — CULTURE, BLOOD (ROUTINE X 2)
Culture: NO GROWTH
Culture: NO GROWTH

## 2011-03-02 LAB — PREGNANCY, URINE: Preg Test, Ur: NEGATIVE

## 2011-03-05 ENCOUNTER — Telehealth: Payer: Self-pay | Admitting: Internal Medicine

## 2011-03-05 MED ORDER — CLOTRIMAZOLE 10 MG MT TROC: OROMUCOSAL | Status: DC

## 2011-03-05 NOTE — Telephone Encounter (Signed)
I spoke with pt and she c/o thrush x 2 days. She c/o sores on her tongue, white puss in her mouth and back of throat, gums hurt x 2 days. Pt last OV 12/16/10 and NS x 2. I offered OV but pt refused stating she had to take her husband to the cancer center. Pt is requesting something be called in for her. Please advise, Dr. Sherene Sires, thanks  Allergies  Allergen Reactions  . Amiodarone   . Clopidogrel Bisulfate     REACTION: hives     Carver Fila, CMA

## 2011-03-05 NOTE — Telephone Encounter (Signed)
Pt aware of MW recs and rx has been sent. Pt aware she needs OV and no more call ins. Pt verbalized understanding but stated she could not make one right now

## 2011-03-05 NOTE — Telephone Encounter (Signed)
Clotrimazole troche 10 mg qid prn (#12)  No refills, no more call ins s ov

## 2011-03-29 ENCOUNTER — Ambulatory Visit: Payer: Medicaid Other | Admitting: Internal Medicine

## 2011-04-02 ENCOUNTER — Ambulatory Visit: Payer: Medicaid Other | Admitting: Internal Medicine

## 2011-06-13 DIAGNOSIS — R634 Abnormal weight loss: Secondary | ICD-10-CM | POA: Insufficient documentation

## 2011-06-13 HISTORY — DX: Abnormal weight loss: R63.4

## 2011-07-21 ENCOUNTER — Other Ambulatory Visit: Payer: Self-pay | Admitting: Internal Medicine

## 2011-07-23 ENCOUNTER — Telehealth: Payer: Self-pay | Admitting: Internal Medicine

## 2011-07-23 MED ORDER — BUDESONIDE-FORMOTEROL FUMARATE 160-4.5 MCG/ACT IN AERO: 2.0000 | INHALATION_SPRAY | Freq: Two times a day (BID) | RESPIRATORY_TRACT | Status: DC

## 2011-07-23 NOTE — Telephone Encounter (Signed)
Contacted pt and refilled medication. Pt aware and nothing else needed.

## 2011-07-27 ENCOUNTER — Ambulatory Visit: Payer: Medicaid Other | Attending: Rehabilitation | Admitting: Rehabilitative and Restorative Service Providers"

## 2011-07-27 DIAGNOSIS — IMO0001 Reserved for inherently not codable concepts without codable children: Secondary | ICD-10-CM | POA: Insufficient documentation

## 2011-07-27 DIAGNOSIS — M545 Low back pain, unspecified: Secondary | ICD-10-CM | POA: Insufficient documentation

## 2011-07-27 DIAGNOSIS — M6281 Muscle weakness (generalized): Secondary | ICD-10-CM | POA: Insufficient documentation

## 2011-07-30 ENCOUNTER — Ambulatory Visit (INDEPENDENT_AMBULATORY_CARE_PROVIDER_SITE_OTHER): Payer: Medicaid Other | Admitting: Internal Medicine

## 2011-07-30 ENCOUNTER — Encounter: Payer: Self-pay | Admitting: Internal Medicine

## 2011-07-30 DIAGNOSIS — B3781 Candidal esophagitis: Secondary | ICD-10-CM

## 2011-07-30 DIAGNOSIS — J449 Chronic obstructive pulmonary disease, unspecified: Secondary | ICD-10-CM

## 2011-07-30 MED ORDER — FAMOTIDINE 20 MG PO TABS: ORAL_TABLET | ORAL | Status: DC

## 2011-07-30 NOTE — Progress Notes (Signed)
Subjective:     Patient ID: [name], female   DOB: [birth year].   MRN: [id number]   Brief patient profile:  60 yowf quit smoking January 2011 when admit for heart problems by Dr [name].  March 25, 2010 cc doe overall better than [facility] but can't get from one room to next despite using spiriva daily and proventil hfa twice daily. wakes up every day with very congested cough> mucoid thick sputum and can't get clear until uses am proventil but not typically waking up at night with resp issues. rec symbicort 160 2 puffs first thing in am and 2 puffs again in pm about 12 hours later  Change nexium to Take one 30-60 min before first and last meals of the day  mucinex as needed for cough, take per bottle GERD diet    April 29, 2010 ov Dyspnea-somewhat improved, overall satisfied with less sob, less cough and no need for saba.   rec no change rx  12/16/2010 ov/Kydan Shanholtzer cc thrush recurrent on symbicort rx with diflucan still poor technique, has spacer but not using Take prilosec Take 30- 60 min before your first and last meals of the day  GERD (REFLUX) diet  Try using  the spacer with symbicort Work on perfecting your inhaler technique   07/30/2011 f/u ov/Mynor Witkop states quit smoking completely 02/2011 Patient c/o thrush in mouth x 4 weeks. Also c/o wheezing and cough with thick white mucus. Feels needs saba daily but when pulled her meds out it was qvar, not albuterol she appears to be using.  Dog ate spacer, [name] refusing to see her, confused with names and timing of meds   Pt denies any significant sore throat, dysphagia, itching, sneezing,  nasal congestion or excess/ purulent secretions,  fever, chills, sweats, unintended wt loss, pleuritic or exertional cp, hempoptysis, orthopnea pnd or leg swelling.    Also denies any obvious fluctuation of symptoms with weather or environmental changes or other aggravating or alleviating factors.  Sleeping ok without nocturnal  or early  am exac of resp c/o's or need for noct saba.           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)  HEMORRHOIDS (ICD-455.6)  POLYP, COLON (ICD-211.3)  ESOPHAGEAL STRICTURE (ICD-530.3)  HIATAL HERNIA (ICD-553.3)  GASTRITIS, CHRONIC (ICD-535.10)  CAD (ICD-414.00)  PERIPHERAL NEUROPATHY (ICD-356.9)  HYPERLIPIDEMIA (ICD-272.4)  PEPTIC ULCER DISEASE (ICD-533.90)  ATHEROSCLEROTIC CARDIOVASCULAR DISEASE (ICD-429.2)  DEPRESSION (ICD-311)  ANXIETY (ICD-300.00)  COPD..................................................................Marland KitchenWert  - PFt's 06/15/10 GOLD II see overview GERD (ICD-530.81)           Objective:   Physical Exam     thin amb wf with peculiar affect and minimal congested sounding cough  wt 115 March 25, 2010 > 115 December 7 , 2011 >  108 12/16/2010 > 07/30/2011  106 HEENT mild turbinate edema. Oropharynx no obvious thrush or excess pnd or cobblestoning. No JVD or cervical adenopathy. Mild accessory muscle hypertrophy. Trachea midline, nl thryroid. Chest was hyperinflated by percussion with diminished breath sounds and moderate increased exp time without wheeze. Hoover sign positive at mid inspiration. Regular rate and rhythm without murmur gallop or rub or increase P2 or edema. Abd: no hsm, nl excursion. Ext warm without cyanosis or clubbing. Assessment:         Plan:

## 2011-07-30 NOTE — Assessment & Plan Note (Addendum)
-   PFT's 06/15/10 FEV1  1.54 (61%) ratio 55 and DLC0 62% no better p B2   - HFA 50% p coaching 07/30/2011   DDX of  difficult airways managment all start with A and  include Adherence, Ace Inhibitors, Acid Reflux, Active Sinus Disease, Alpha 1 Antitripsin deficiency, Anxiety masquerading as Airways dz,  ABPA,  allergy(esp in young), Aspiration (esp in elderly), Adverse effects of DPI,  Active smokers, plus two Bs  = Bronchiectasis and Beta blocker use..and one C= CHF   ? Still Active smoking greatest concern > she denies  ? Beta blocker effect, admits not using coreg consistently anyway "makes me feel real bad" Adherence is always the initial "prime suspect" and is a multilayered concern that requires a "trust but verify" approach in every patient - starting with knowing how to use medications, especially inhalers, correctly, keeping up with refills and understanding the fundamental difference between maintenance and prns vs those medications only taken for a very short course and then stopped and not refilled. Struggling with concept of med reconciliation.  To keep things simple, I have asked the patient to first separate medicines that are perceived as maintenance, that is to be taken daily "no matter what", from those medicines that are taken on only on an as-needed basis and I have given the patient examples of both, and then return to see our NP to generate a  detailed  medication calendar which should be followed until the next physician sees the patient and updates it.   If not willing to do this then we will not be seeing her back in pulmonary clinic for regular f/u  The proper method of use, as well as anticipated side effects, of this metered-dose inhaler are discussed and demonstrated to the patient. Improved only to 50% despite ext coaching

## 2011-07-30 NOTE — Assessment & Plan Note (Signed)
No obvious thrush but she is at risk due poor understanding / use of hfa/ spacers. Using 2 ICS!  Rec stop qvar, regoup within 2 weeks with all active meds in hand

## 2011-07-30 NOTE — Patient Instructions (Signed)
Work on inhaler technique:  relax and gently blow all the way out then take a nice smooth deep breath back in, triggering the inhaler at same time you start breathing in.  Hold for up to 5 seconds if you can.  Rinse and gargle with water when done   If your mouth or throat starts to bother you,   I suggest you time the inhaler to your dental care and after using the inhaler(s) brush teeth and tongue with a baking soda containing toothpaste and when you rinse this out, gargle with it first to see if this helps your mouth and throat.     Add pepcid 20 mg one at bedtime  Stop qvar and coreg (corevidol)  GERD (REFLUX)  is an extremely common cause of respiratory symptoms(just like yours) , many times with no significant heartburn at all.    It can be treated with medication, but also with lifestyle changes including avoidance of late meals, excessive alcohol, smoking cessation, and avoid fatty foods, chocolate, peppermint, colas, red wine, and acidic juices such as orange juice.  NO MINT OR MENTHOL PRODUCTS SO NO COUGH DROPS  USE SUGARLESS CANDY INSTEAD (jolley ranchers or Stover's)  NO OIL BASED VITAMINS - use powdered substitutes.    See Tammy NP w/in 2 weeks with all your medications, even over the counter meds, separated in two separate bags, the ones you take no matter what vs the ones you stop once you feel better and take only as needed when you feel you need them.   Tammy  will generate for you a new user friendly medication calendar that will put [place] all on the same page re: your medication use.     Without this process, it simply isn't possible to assure that we are providing  your outpatient care  with  the attention to detail we feel you deserve.   If we cannot assure that you're getting that kind of care,  then we cannot manage your problem effectively from this clinic.  Once you have seen Tammy and we are sure that we're all on the same page with your medication use she will arrange  follow up with me.

## 2011-08-03 ENCOUNTER — Ambulatory Visit: Payer: Medicaid Other | Admitting: Rehabilitative and Restorative Service Providers"

## 2011-08-06 ENCOUNTER — Ambulatory Visit: Payer: Medicaid Other | Admitting: Rehabilitation

## 2011-08-10 ENCOUNTER — Encounter: Payer: Medicaid Other | Admitting: Rehabilitative and Restorative Service Providers"

## 2011-08-11 ENCOUNTER — Telehealth: Payer: Self-pay | Admitting: Internal Medicine

## 2011-08-11 ENCOUNTER — Encounter: Payer: Medicaid Other | Admitting: Rehabilitative and Restorative Service Providers"

## 2011-08-11 NOTE — Telephone Encounter (Signed)
Pt returned call. [name]  

## 2011-08-11 NOTE — Telephone Encounter (Signed)
lmomtcb x1 

## 2011-08-12 ENCOUNTER — Emergency Department (HOSPITAL_COMMUNITY)
Admission: EM | Admit: 2011-08-12 | Discharge: 2011-08-12 | Discharge status: Against Medical Advice | Payer: Medicaid Other | Attending: Emergency Medicine | Admitting: Emergency Medicine

## 2011-08-12 ENCOUNTER — Ambulatory Visit (HOSPITAL_COMMUNITY): Admission: RE | Admit: 2011-08-12 | Payer: Medicaid Other | Source: Ambulatory Visit

## 2011-08-12 ENCOUNTER — Encounter (HOSPITAL_COMMUNITY): Payer: Self-pay | Admitting: *Deleted

## 2011-08-12 DIAGNOSIS — R05 Cough: Secondary | ICD-10-CM | POA: Insufficient documentation

## 2011-08-12 DIAGNOSIS — R0602 Shortness of breath: Secondary | ICD-10-CM | POA: Insufficient documentation

## 2011-08-12 DIAGNOSIS — IMO0001 Reserved for inherently not codable concepts without codable children: Secondary | ICD-10-CM | POA: Insufficient documentation

## 2011-08-12 DIAGNOSIS — R059 Cough, unspecified: Secondary | ICD-10-CM | POA: Insufficient documentation

## 2011-08-12 NOTE — ED Notes (Signed)
Pt in c/o cough, congestion, body aches, sore throat, and shortness of breath x2 days

## 2011-08-12 NOTE — Telephone Encounter (Signed)
ATC line busy, WCB 

## 2011-08-13 ENCOUNTER — Encounter: Payer: Medicaid Other | Admitting: Adult Health

## 2011-08-13 NOTE — Telephone Encounter (Signed)
[email or web address].Ravonda Brecheen, CMA  

## 2011-08-16 NOTE — Telephone Encounter (Signed)
LMOMTCB x 1 

## 2011-08-17 ENCOUNTER — Ambulatory Visit: Payer: Medicaid Other | Admitting: Rehabilitative and Restorative Service Providers"

## 2011-08-17 NOTE — Telephone Encounter (Signed)
Her problem is she doesn't know her meds and has not complied with my recommendation to follow up with Tammy to get meds straightened out.  I would be happy to let her see any of the other pulmonary doctors (if they'll accept her)  but she first needs to comply with this recommendation or none of our docs will be able to help her.  Since Dr Beaulah Dinning has refused to see her the only other option she has is [facility]  Please copy this note to her primary doctor, [facility]

## 2011-08-17 NOTE — Telephone Encounter (Signed)
Spoke with pt. She states does not want to return to see MW and requests a different doc. Does not have a preference who she sees. Please advise, thanks!

## 2011-08-17 NOTE — Telephone Encounter (Signed)
lmtcb

## 2011-08-18 NOTE — Telephone Encounter (Signed)
lmomtcb  

## 2011-08-18 NOTE — Telephone Encounter (Signed)
Called, spoke with pt.  I informed her MW would be ok for her seeing another dr in the office but first, we will need her to come in to see Tammy for a med calander, and at that time, Tammy have her f/u with another dr.  Algis Downs she will need to bring ALL MEDICATIONS she is currently taking with her, even if she takes them only prn.  Advised Tammy will not be able to do this calender without her actual medications present at the OV.  She verbalized understanding of this.  Also, she c/o thrush "since my last appt with Dr. Sherene Sires."  States she has white sores in her throat and checks.  Offered OV today but pt unable to come.  Requesting a morning appt.  Scheduled med calender with TP on April 3 at 9 am and advised she will address the thrush at this time as well.  Pt ok with this date and time.  She will call back if anything further is needed.

## 2011-08-18 NOTE — Telephone Encounter (Signed)
Pt returning call

## 2011-08-25 ENCOUNTER — Ambulatory Visit (INDEPENDENT_AMBULATORY_CARE_PROVIDER_SITE_OTHER): Payer: Medicaid Other | Admitting: Adult Health

## 2011-08-25 ENCOUNTER — Ambulatory Visit (INDEPENDENT_AMBULATORY_CARE_PROVIDER_SITE_OTHER)
Admission: RE | Admit: 2011-08-25 | Discharge: 2011-08-25 | Disposition: A | Discharge status: Home / Self Care | Payer: Medicaid Other | Source: Ambulatory Visit | Attending: Adult Health | Admitting: Adult Health

## 2011-08-25 ENCOUNTER — Encounter: Payer: Self-pay | Admitting: Adult Health

## 2011-08-25 VITALS — BP 102/62 | HR 65 | Temp 97.0°F | Ht 66.0 in | Wt 105.0 lb

## 2011-08-25 DIAGNOSIS — J449 Chronic obstructive pulmonary disease, unspecified: Secondary | ICD-10-CM

## 2011-08-25 DIAGNOSIS — B3781 Candidal esophagitis: Secondary | ICD-10-CM

## 2011-08-25 DIAGNOSIS — I2510 Atherosclerotic heart disease of native coronary artery without angina pectoris: Secondary | ICD-10-CM

## 2011-08-25 DIAGNOSIS — Z23 Encounter for immunization: Secondary | ICD-10-CM

## 2011-08-25 MED ORDER — CLOTRIMAZOLE 10 MG MT TROC: 10.0000 mg | Freq: Every day | OROMUCOSAL | Status: DC

## 2011-08-25 MED ORDER — NEBIVOLOL HCL 2.5 MG PO TABS: 2.5000 mg | ORAL_TABLET | Freq: Every day | ORAL | Status: DC

## 2011-08-25 NOTE — Progress Notes (Signed)
Subjective:     Patient ID: [name], female   DOB: 07/11/1950.   MRN: [id number]   Brief patient profile:  60 yowf quit smoking January 2011 when admit for heart problems by Dr [name].  March 25, 2010 cc doe overall better than at discharge from [facility] but can't get from one room to next despite using spiriva daily and proventil hfa twice daily. wakes up every day with very congested cough> mucoid thick sputum and can't get clear until uses am proventil but not typically waking up at night with resp issues. rec symbicort 160 2 puffs first thing in am and 2 puffs again in pm about 12 hours later  Change nexium to Take one 30-60 min before first and last meals of the day  mucinex as needed for cough, take per bottle GERD diet    April 29, 2010 ov Dyspnea-somewhat improved, overall satisfied with less sob, less cough and no need for saba.   rec no change rx  12/16/2010 ov/Wert cc thrush recurrent on symbicort rx with diflucan still poor technique, has spacer but not using Take prilosec Take 30- 60 min before your first and last meals of the day  GERD (REFLUX) diet  Try using  the spacer with symbicort Work on perfecting your inhaler technique   07/30/2011 f/u ov/Wert states quit smoking completely 02/2011 Patient c/o thrush in mouth x 4 weeks. Also c/o wheezing and cough with thick white mucus. Feels needs saba daily but when pulled her meds out it was qvar, not albuterol she appears to be using.  Dog ate spacer, [name] refusing to see her, confused with names and timing of meds >>stop QVAR , and Coreg, added pepcid At bedtime    08/25/2011 Follow up and med review  Patient returns for a followup office visit and medication review. We reviewed all her medications and organize them into a medication calendar with patient education. It appears that she is taking her medications correctly. Patient states that she continues to have cough and congestion, and has been seen by her primary care  doctor and her allergist. Symptoms are starting to improve. However, she complains that she has thrush in the back of her throat. We discussed several strategies to help prevent thrush with rinsing and gargling after her inhaler use. She denies any hemoptysis, chest pain, orthopnea, PND, or leg swelling.    ROS:  Constitutional:   No  weight loss, night sweats,  Fevers, chills, + fatigue, or  lassitude.  HEENT:   No headaches,  Difficulty swallowing,  Tooth/dental problems, or  Sore throat,                No sneezing, itching, ear ache, + nasal congestion, post nasal drip,   CV:  No chest pain,  Orthopnea, PND, swelling in lower extremities, anasarca, dizziness, palpitations, syncope.   GI  No heartburn, indigestion, abdominal pain, nausea, vomiting, diarrhea, change in bowel habits, loss of appetite, bloody stools.   Resp:    No coughing up of blood.  No change in color of mucus.  No wheezing.  No chest wall deformity  Skin: no rash or lesions.  GU: no dysuria, change in color of urine, no urgency or frequency.  No flank pain, no hematuria   MS:  No joint pain or swelling.  No decreased range of motion.  No back pain.  Psych:  No change in mood or affect. No depression or anxiety.  No memory loss.  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)  HEMORRHOIDS (ICD-455.6)  POLYP, COLON (ICD-211.3)  ESOPHAGEAL STRICTURE (ICD-530.3)  HIATAL HERNIA (ICD-553.3)  GASTRITIS, CHRONIC (ICD-535.10)  CAD (ICD-414.00)  PERIPHERAL NEUROPATHY (ICD-356.9)  HYPERLIPIDEMIA (ICD-272.4)  PEPTIC ULCER DISEASE (ICD-533.90)  ATHEROSCLEROTIC CARDIOVASCULAR DISEASE (ICD-429.2)  DEPRESSION (ICD-311)  ANXIETY (ICD-300.00)  COPD..................................................................Marland KitchenWert  - PFt's 06/15/10 GOLD II see overview -med calendar  08/25/2011  GERD (ICD-530.81)           Objective:   Physical Exam   wt 115  March 25, 2010 > 115 December 7 , 2011 >  108 12/16/2010 > 07/30/2011  106 >105 08/25/2011  HEENT mild turbinate edema. Oropharynx with scattered white patches  No JVD or cervical adenopathy. Mild accessory muscle hypertrophy. Trachea midline, nl thryroid. Chest was hyperinflated by percussion with diminished breath sounds and moderate increased exp time without wheeze.  Regular rate and rhythm without murmur gallop or rub or increase P2 or edema. Abd: no hsm, nl excursion. Ext warm without cyanosis or clubbing. Assessment:         Plan:

## 2011-08-25 NOTE — Patient Instructions (Signed)
Begin Bystolic 2.5mg  daily in place of Coreg  Mycelex troche five times daily for 7 days for thrush I will call with xray results.  Pneumovax today  Follow up in 4 weeks and As needed

## 2011-08-25 NOTE — Assessment & Plan Note (Signed)
Coreg was stopped last ov , will begin low dose bystolic as she has extensive hx of CAD/PVD s/p CABG and stents   follow up with cards as planned

## 2011-08-25 NOTE — Assessment & Plan Note (Addendum)
Recurrent exacerbation. Patient is exposed to secondary smoke in her home. She seems to be clinically, improving with recent, steroids, and antibiotic use. We'll check chest x-ray today. Pneumovax. Also, today. Patient's continue on Spiriva, and Symbicort. Advised to brush rinse and gargle after inhaler use. Patient's medications were reviewed today and patient education was given. Computerized medication calendar was adjusted/completed

## 2011-08-25 NOTE — Assessment & Plan Note (Signed)
mycelex troches x 1 week

## 2011-08-26 ENCOUNTER — Telehealth: Payer: Self-pay | Admitting: Internal Medicine

## 2011-08-26 DIAGNOSIS — J449 Chronic obstructive pulmonary disease, unspecified: Secondary | ICD-10-CM

## 2011-08-26 DIAGNOSIS — J45909 Unspecified asthma, uncomplicated: Secondary | ICD-10-CM

## 2011-08-26 NOTE — Telephone Encounter (Signed)
Fine with me

## 2011-08-26 NOTE — Telephone Encounter (Signed)
lmomtcb x1 to see who pt wants to switch too

## 2011-08-26 NOTE — Telephone Encounter (Signed)
Pt seen by TP 4.3.13 for med calendar.  Requesting to change physicians, currently sees Dr Sherene Sires.  Per protocol, will forward to Dr Sherene Sires to see if switch is ok.  Dr Sherene Sires please advise, thanks.

## 2011-08-27 ENCOUNTER — Telehealth: Payer: Self-pay | Admitting: Adult Health

## 2011-08-27 NOTE — Telephone Encounter (Signed)
Bystolic is not covered by [employer]. Called [employer] (Med[employer]) at [phone]. Member ID # [id number]. The covered alternatives are : Atenolol, Metoprolol, Propranolol and Labetalol. Pls advise if one of the covered alternatives would be appropriate for the patient.  Allergies  Allergen Reactions  . Amiodarone   . Clopidogrel Bisulfate     REACTION: hives

## 2011-08-27 NOTE — Telephone Encounter (Signed)
Pt is returning call of the triage nurse.  Pt stated that she would like a doc who has good bedside manner.  Also, pt would like to know if we have received her CXR results yet.  Thanks.  [name]

## 2011-08-27 NOTE — Telephone Encounter (Signed)
I would respectfully decline.

## 2011-08-27 NOTE — Telephone Encounter (Signed)
I spoke with pt and she is requesting to change to [facility]. Also i advised pt of her cxr results. She is coming to have the repeat cxr on Monday. I have placed the order. Are you okay with this switch Dr. Shelle Iron, thanks,

## 2011-08-27 NOTE — Telephone Encounter (Signed)
Called home number and pt is not at home at this time.  Will try back later to make her aware of [facility] recs.

## 2011-08-30 ENCOUNTER — Ambulatory Visit (INDEPENDENT_AMBULATORY_CARE_PROVIDER_SITE_OTHER)
Admission: RE | Admit: 2011-08-30 | Discharge: 2011-08-30 | Disposition: A | Discharge status: Home / Self Care | Payer: Medicaid Other | Source: Ambulatory Visit | Attending: Adult Health | Admitting: Adult Health

## 2011-08-30 DIAGNOSIS — J45909 Unspecified asthma, uncomplicated: Secondary | ICD-10-CM

## 2011-08-30 DIAGNOSIS — J449 Chronic obstructive pulmonary disease, unspecified: Secondary | ICD-10-CM

## 2011-08-30 MED ORDER — BISOPROLOL FUMARATE 5 MG PO TABS: 2.5000 mg | ORAL_TABLET | Freq: Every day | ORAL | Status: DC

## 2011-08-30 NOTE — Progress Notes (Signed)
Addended by: Nita Sells on: 08/30/2011 02:42 PM   Modules accepted: Orders

## 2011-08-30 NOTE — Telephone Encounter (Signed)
Ok with me  - next available please - no double booking

## 2011-08-30 NOTE — Telephone Encounter (Signed)
Pt aware. [name], CMA  

## 2011-08-30 NOTE — Telephone Encounter (Signed)
LMTCBx1.Yarely Bebee, CMA  

## 2011-08-30 NOTE — Telephone Encounter (Signed)
LMTCBx1 to advise the pt of med change.  rx called into pharmacy and they are aware to cancel other rx. Carron Curie, CMA

## 2011-08-30 NOTE — Telephone Encounter (Signed)
I spoke with the pt and advised her of [facility] response and she states she doe not want to stay with Dr. Sherene Sires and requests to be changed to any other doctor in the practice. Dr. Vassie Loll would you like to accept this patient? Please advise.Carron Curie, CMA

## 2011-08-30 NOTE — Telephone Encounter (Signed)
May change to Bisoprolol 5mg  1/2 daily #30 , 1 refills  See if this covered .  Please contact office for sooner follow up if symptoms do not improve or worsen or seek emergency care

## 2011-08-31 NOTE — Telephone Encounter (Signed)
Pt wants a morning appt only- she will call back to schedule new consult w/ RA after receiving reminder letter for June schedule as only pm appt avail until then. Nothing further needed at this time per pt. [name]

## 2011-08-31 NOTE — Telephone Encounter (Signed)
ATC pt NA unable to leave VM

## 2011-09-03 ENCOUNTER — Emergency Department (HOSPITAL_COMMUNITY)
Admission: EM | Admit: 2011-09-03 | Discharge: 2011-09-03 | Disposition: A | Discharge status: Home / Self Care | Payer: Medicaid Other | Attending: Emergency Medicine | Admitting: Emergency Medicine

## 2011-09-03 ENCOUNTER — Emergency Department (HOSPITAL_COMMUNITY): Payer: Medicaid Other

## 2011-09-03 DIAGNOSIS — Z79899 Other long term (current) drug therapy: Secondary | ICD-10-CM | POA: Insufficient documentation

## 2011-09-03 DIAGNOSIS — Z8711 Personal history of peptic ulcer disease: Secondary | ICD-10-CM | POA: Insufficient documentation

## 2011-09-03 DIAGNOSIS — R0789 Other chest pain: Secondary | ICD-10-CM | POA: Insufficient documentation

## 2011-09-03 DIAGNOSIS — J449 Chronic obstructive pulmonary disease, unspecified: Secondary | ICD-10-CM | POA: Insufficient documentation

## 2011-09-03 DIAGNOSIS — E785 Hyperlipidemia, unspecified: Secondary | ICD-10-CM | POA: Insufficient documentation

## 2011-09-03 DIAGNOSIS — I252 Old myocardial infarction: Secondary | ICD-10-CM | POA: Insufficient documentation

## 2011-09-03 DIAGNOSIS — I2510 Atherosclerotic heart disease of native coronary artery without angina pectoris: Secondary | ICD-10-CM | POA: Insufficient documentation

## 2011-09-03 DIAGNOSIS — J4489 Other specified chronic obstructive pulmonary disease: Secondary | ICD-10-CM | POA: Insufficient documentation

## 2011-09-03 DIAGNOSIS — Z7982 Long term (current) use of aspirin: Secondary | ICD-10-CM | POA: Insufficient documentation

## 2011-09-03 DIAGNOSIS — F341 Dysthymic disorder: Secondary | ICD-10-CM | POA: Insufficient documentation

## 2011-09-03 DIAGNOSIS — K219 Gastro-esophageal reflux disease without esophagitis: Secondary | ICD-10-CM | POA: Insufficient documentation

## 2011-09-03 LAB — BASIC METABOLIC PANEL
Chloride: 101 mEq/L (ref 96–112)
Creatinine, Ser: 0.67 mg/dL (ref 0.50–1.10)
GFR calc Af Amer: >90 mL/min (ref >90)
Potassium: 4.5 mEq/L (ref 3.5–5.1)

## 2011-09-03 LAB — CBC
MCV: 96.1 fL (ref 78.0–100.0)
Platelets: 218 10*3/uL (ref 150–400)
RDW: 12.6 % (ref 11.5–15.5)
WBC: 8.9 10*3/uL (ref 4.0–10.5)

## 2011-09-03 LAB — TROPONIN I: Troponin I: <0.3 ng/mL (ref <0.30)

## 2011-09-03 MED ORDER — FENTANYL CITRATE 0.05 MG/ML IJ SOLN
50.0000 ug | Freq: Once | INTRAMUSCULAR | Status: DC
  Filled 2011-09-03: qty 2

## 2011-09-03 MED ORDER — FENTANYL CITRATE 0.05 MG/ML IJ SOLN
100.0000 ug | Freq: Once | INTRAMUSCULAR | Status: AC
  Administered 2011-09-03: 100 ug via INTRAVENOUS

## 2011-09-03 MED ORDER — FENTANYL CITRATE 0.05 MG/ML IJ SOLN
25.0000 ug | Freq: Once | INTRAMUSCULAR | Status: DC
  Filled 2011-09-03: qty 2

## 2011-09-03 MED ORDER — FENTANYL CITRATE 0.05 MG/ML IJ SOLN
25.0000 ug | Freq: Once | INTRAMUSCULAR | Status: AC
  Administered 2011-09-03: 25 ug via INTRAVENOUS
  Filled 2011-09-03: qty 2

## 2011-09-03 MED ORDER — ONDANSETRON HCL 4 MG/2ML IJ SOLN
INTRAMUSCULAR | Status: AC
  Filled 2011-09-03: qty 2

## 2011-09-03 MED ORDER — ONDANSETRON HCL 4 MG/2ML IJ SOLN
INTRAMUSCULAR | Status: AC
  Administered 2011-09-03: 4 mg via INTRAVENOUS
  Filled 2011-09-03: qty 2

## 2011-09-03 NOTE — Discharge Instructions (Signed)
Chest Pain (Nonspecific) It is often hard to give a specific diagnosis for the cause of chest pain. There is always a chance that your pain could be related to something serious, such as a heart attack or a blood clot in the lungs. You need to follow up with your caregiver for further evaluation. CAUSES   Heartburn.   Pneumonia or bronchitis.   Anxiety or stress.   Inflammation around your heart (pericarditis) or lung (pleuritis or pleurisy).   A blood clot in the lung.   A collapsed lung (pneumothorax). It can develop suddenly on its own (spontaneous pneumothorax) or from injury (trauma) to the chest.   Shingles infection (herpes zoster virus).  The chest wall is composed of bones, muscles, and cartilage. Any of these can be the source of the pain.  The bones can be bruised by injury.   The muscles or cartilage can be strained by coughing or overwork.   The cartilage can be affected by inflammation and become sore (costochondritis).  DIAGNOSIS  Lab tests or other studies, such as X-rays, electrocardiography, stress testing, or cardiac imaging, may be needed to find the cause of your pain.  TREATMENT   Treatment depends on what may be causing your chest pain. Treatment may include:   Acid blockers for heartburn.   Anti-inflammatory medicine.   Pain medicine for inflammatory conditions.   Antibiotics if an infection is present.   You may be advised to change lifestyle habits. This includes stopping smoking and avoiding alcohol, caffeine, and chocolate.   You may be advised to keep your head raised (elevated) when sleeping. This reduces the chance of acid going backward from your stomach into your esophagus.   Most of the time, nonspecific chest pain will improve within 2 to 3 days with rest and mild pain medicine.  HOME CARE INSTRUCTIONS   If antibiotics were prescribed, take your antibiotics as directed. Finish them even if you start to feel better.   For the next few  days, avoid physical activities that bring on chest pain. Continue physical activities as directed.   Do not smoke.   Avoid drinking alcohol.   Only take over-the-counter or prescription medicine for pain, discomfort, or fever as directed by your caregiver.   Follow your caregiver's suggestions for further testing if your chest pain does not go away.   Keep any follow-up appointments you made. If you do not go to an appointment, you could develop lasting (chronic) problems with pain. If there is any problem keeping an appointment, you must call to reschedule.  SEEK MEDICAL CARE IF:   You think you are having problems from the medicine you are taking. Read your medicine instructions carefully.   Your chest pain does not go away, even after treatment.   You develop a rash with blisters on your chest.  SEEK IMMEDIATE MEDICAL CARE IF:   You have increased chest pain or pain that spreads to your arm, neck, jaw, back, or abdomen.   You develop shortness of breath, an increasing cough, or you are coughing up blood.   You have severe back or abdominal pain, feel nauseous, or vomit.   You develop severe weakness, fainting, or chills.   You have a fever.  THIS IS AN EMERGENCY. Do not wait to see if the pain will go away. Get medical help at once. Call your local emergency services (911 in [place] Do not drive yourself to the hospital. MAKE SURE YOU:   Understand these instructions.     Will watch your condition.   Will get help right away if you are not doing well or get worse.  Document Released: 02/17/2005 Document Revised: 04/29/2011 Document Reviewed: 12/14/2007 [employer] Patient Information 2012 [employer]. 

## 2011-09-03 NOTE — ED Provider Notes (Signed)
History     CSN: [id number]  Arrival date & time 09/03/11  1534   First MD Initiated Contact with Patient 09/03/11 1536      Chief Complaint  Patient presents with  . 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 The pain has been constant but has waxed and waned. She was taken 324 of aspirin and 3 of her own nitroglycerin with complete resolution of pain. The patient has had a history of chest pain with multiple stents and bypasses in the past. She states that her symptoms never feels fine when she is chest pain therefore this does not feel like any previous episode of chest pain. She does have a history of esophageal disease and peptic ulcers. The patient is alert and oriented in no acute distress at this time her pain is a 7/10.   Past Medical History  Diagnosis Date  . Acute myocardial infarction, unspecified site, episode of care unspecified   . Dyskinesia of esophagus   . Unspecified hemorrhoids without mention of complication   . Benign neoplasm of colon   . Stricture and stenosis of esophagus   . Diaphragmatic hernia without mention of obstruction or gangrene   . 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 Coronary atherosclerosis of unspecified type of vessel, native or graft   . Unspecified hereditary and idiopathic peripheral neuropathy   . Other and unspecified hyperlipidemia   . Peptic ulcer, unspecified site, unspecified as acute or chronic, without mention of hemorrhage, perforation, or obstruction   . Unspecified cardiovascular disease   . Depressive disorder, not elsewhere classified   . Anxiety state, unspecified   . COPD (chronic obstructive pulmonary disease)   . GERD (gastroesophageal reflux disease)     Past Surgical History    Procedure Date  . Tonsillectomy 1974  . Fracture lt jaw 1976  . Bilateral breast benign tumor excision 1969, 1970  . Epigastric hernia repair 2008  . Multiple cardiac catherizationsw stent placements   . Coronary artery bypass graft 1995  . Carpal tunnel release   . Total abdominal hysterectomy 1998  . Laminectomy     Family History  Problem Relation Age of Onset  . Lung cancer Father   . Heart disease Mother   . Heart disease Sister   . Heart disease Father     History  Substance Use Topics  . Smoking status: Former Smoker -- 30 years    Types: Cigarettes    Quit date: 05/24/2009  . Smokeless tobacco: Not on file  . Alcohol Use: Not on file    OB History    Grav Para Term Preterm Abortions TAB SAB Ect Mult Living                  Review of Systems  Allergies  Amiodarone and Clopidogrel bisulfate  Home Medications   Current Outpatient Rx  Name Route Sig Dispense Refill  . ALBUTEROL SULFATE HFA 108 (90 BASE) MCG/ACT IN AERS Inhalation Inhale 2 puffs into the lungs every 4 (four) hours as needed. For shortness of breath    . ASPIRIN 81 MG PO TBEC Oral Take 81 mg by mouth daily.      . ATORVASTATIN CALCIUM 80 MG PO TABS Oral Take 80 mg by mouth daily.    . BUDESONIDE-FORMOTEROL FUMARATE 160-4.5  MCG/ACT IN AERO Inhalation Inhale 2 puffs into the lungs 2 (two) times daily. 1 Inhaler 3  . VITAMIN D3 5000 UNITS PO TABS Oral Take 1 tablet by mouth daily.      Marland Kitchen DM-GUAIFENESIN ER 30-600 MG PO TB12 Oral Take 1 tablet by mouth every 12 (twelve) hours.      Marland Kitchen EZETIMIBE 10 MG PO TABS Oral Take 10 mg by mouth daily.      Marland Kitchen FAMOTIDINE 20 MG PO TABS  One at bedtime 30 tablet 11  . FLUTICASONE PROPIONATE 50 MCG/ACT NA SUSP Nasal Place 2 sprays into the nose 2 (two) times daily.      Marland Kitchen HYDROCODONE-ACETAMINOPHEN 10-325 MG PO TABS Oral Take 1 tablet by mouth every 6 (six) hours as needed. 1-2 tablets every 4-6 hours as needed. For pain    . MONTELUKAST SODIUM 10 MG PO TABS Oral  Take 10 mg by mouth at bedtime.      . NEBIVOLOL HCL 2.5 MG PO TABS Oral Take 1 tablet (2.5 mg total) by mouth daily. 30 tablet 0  . NITROGLYCERIN 0.4 MG SL SUBL Sublingual Place 0.4 mg under the tongue every 5 (five) minutes as needed. For chest pain    . OMEPRAZOLE 20 MG PO CPDR Oral Take 20 mg by mouth 2 (two) times daily.      Marland Kitchen PRASUGREL HCL 10 MG PO TABS Oral Take by mouth daily.      Marland Kitchen TIOTROPIUM BROMIDE MONOHYDRATE 18 MCG IN CAPS Inhalation Place 18 mcg into inhaler and inhale daily.       BP 110/56  Pulse 56  Temp(Src) 97.9 F (36.6 C) (Oral)  Resp 14  SpO2 100%  Physical Exam  Nursing note and vitals reviewed. Constitutional: She appears well-developed and well-nourished. No distress.  HENT:  Head: Normocephalic and atraumatic.  Eyes: Pupils are equal, round, and reactive to light.  Neck: Normal range of motion. Neck supple.  Cardiovascular: Normal rate and regular rhythm.   Pulmonary/Chest: Effort normal. No respiratory distress. She has no wheezes. She has no rales. She exhibits no tenderness.  Abdominal: Soft.  Neurological: She is alert.  Skin: Skin is warm and dry.    ED Course  Procedures (including critical care time)  Labs Reviewed  CBC - Abnormal; Notable for the following:    RBC 3.86 (*)    All other components within normal limits  BASIC METABOLIC PANEL  TROPONIN I  TROPONIN I   Dg Chest 2 View  09/03/2011  *RADIOLOGY REPORT*  Clinical Data: Chest pain  CHEST - 2 VIEW  Comparison: 08/30/2011  Findings: Normal heart size with clear lung fields.  Prior CABG. Prior thoracolumbar fusion.  COPD with moderate hyperinflation. No effusion or pneumothorax.  IMPRESSION: Stable chest.  No active cardiopulmonary disease.  Original Report Authenticated By: Elsie Stain, M.D.     1. Chest pain, musculoskeletal       MDM   Date: 09/03/2011  Rate: 57  Rhythm: normal sinus rhythm  QRS Axis: normal  Intervals: normal  ST/T Wave abnormalities: normal   Conduction Disutrbances:none  Narrative Interpretation:   Old EKG Reviewed: unchanged (improved) from Oct 02, 2009  Repeat second Trop at 7:00pm.  @7 :50 PM patient asking to leave. She states that she feels better and does not want to wait for the second Troponin to result. Dr. Effie Shy has seen patient and we do not believe her pain is cardiac. Pt has appoint coming up this Friday which she has assured  me she will go to. \ Pt has been advised of the symptoms that warrant their return to the ED. Patient has voiced understanding and has agreed to follow-up with Dr. [name].         Dorthula Matas, PA 09/03/11 1953

## 2011-09-03 NOTE — ED Notes (Signed)
Per EMS Pt starting having chest pain at 11:00 this AM describes as heavy pressure 8/10 on 0/10 scale. She has extensive cardiac history with CABG and stent placements. Pt received 20g NS IV left AC, 648mg  ASA PO, took 3 of her own nitro prior to EMS arrival. EMS also gave 6mg  morphine IV with reduction in pain to 6/10 and 4mg  zofran IV. They noted no significant 12 lead changes in route.

## 2011-09-03 NOTE — ED Provider Notes (Signed)
This is a persistent chest, back, and left arm pain since several hours ago. He did not improve with treatment at home. She, has upper back pain from her spine disorder and chronically takes narcotics. She denies associated sweating, dizziness, or weakness. She is frail, anxious, and uncomfortable. Heart regular rhythm. No murmur. Lungs clear to auscultation. Psychiatric- anxious.  Evaluation is consistent noncardiac chest pain.   Medical screening examination/treatment/procedure(s) were conducted as a shared visit with non-physician practitioner(s) and myself.  I personally evaluated the patient during the encounter  Flint Melter, MD 09/04/11 0130

## 2011-09-13 ENCOUNTER — Telehealth: Payer: Self-pay | Admitting: Internal Medicine

## 2011-09-13 ENCOUNTER — Other Ambulatory Visit: Payer: Self-pay | Admitting: Internal Medicine

## 2011-09-13 DIAGNOSIS — Z1231 Encounter for screening mammogram for malignant neoplasm of breast: Secondary | ICD-10-CM

## 2011-09-13 NOTE — Telephone Encounter (Signed)
Left a message for Malachi Bonds to call me back.

## 2011-09-13 NOTE — Telephone Encounter (Signed)
Scheduled on 09/27/11 at 2:00 PM with Dr. [name]. Malachi Bonds to sent records.

## 2011-09-14 ENCOUNTER — Ambulatory Visit
Admission: RE | Admit: 2011-09-14 | Discharge: 2011-09-14 | Disposition: A | Discharge status: Home / Self Care | Payer: Medicaid Other | Source: Ambulatory Visit | Attending: Internal Medicine | Admitting: Internal Medicine

## 2011-09-14 DIAGNOSIS — Z1231 Encounter for screening mammogram for malignant neoplasm of breast: Secondary | ICD-10-CM

## 2011-09-22 ENCOUNTER — Encounter: Payer: Self-pay | Admitting: *Deleted

## 2011-09-23 ENCOUNTER — Encounter: Payer: Self-pay | Admitting: *Deleted

## 2011-09-27 ENCOUNTER — Other Ambulatory Visit: Payer: Self-pay | Admitting: Internal Medicine

## 2011-09-29 ENCOUNTER — Ambulatory Visit (INDEPENDENT_AMBULATORY_CARE_PROVIDER_SITE_OTHER): Payer: Medicaid Other | Admitting: Internal Medicine

## 2011-09-29 ENCOUNTER — Encounter: Payer: Self-pay | Admitting: Internal Medicine

## 2011-09-29 VITALS — BP 100/60 | HR 60 | Ht 66.0 in | Wt 102.0 lb

## 2011-09-29 DIAGNOSIS — R634 Abnormal weight loss: Secondary | ICD-10-CM

## 2011-09-29 DIAGNOSIS — R1319 Other dysphagia: Secondary | ICD-10-CM

## 2011-09-29 NOTE — Progress Notes (Signed)
[name] [birth year] MRN [id number]   History of Present Illness:  This is a 61 year old white female with COPD and chronic bronchitis with pulmonary cachexia and continued weight loss. At her last appointment here in June 2011, her weight was 118 pounds. She is 102 pounds today. We have evaluated her weight loss in the past . Her tissue transglutaminase is negative. Her serum albumin is normal at 4.3 indicating normal protein stores. She does not smoke. She drinks 3 cans of Ensure a day plus breakfast, lunch and supper. She has early satiety but denies vomiting. She has no diarrhea or rectal bleeding. Her last upper endoscopy in June 2011 for chest pain and dysphagia showed Candida esophagitis. She has been on a steroid inhaler. She had a full colonoscopy in August 2006 which showed a polyp and hemorrhoids. A small bowel biopsy in the past was negative for sprue. She had pseudomembranous colitis in 2008.   Past Medical History  Diagnosis Date  . Acute myocardial infarction, unspecified site, episode of care unspecified   . Esophageal dysmotility   . Unspecified hemorrhoids without mention of complication   . Hemorrhoids   . Esophageal stricture   . Diaphragmatic hernia without mention of obstruction or gangrene   . 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 Coronary atherosclerosis of unspecified type of vessel, native or graft   . Peripheral neuropathy   . Other and unspecified hyperlipidemia   . Peptic ulcer, unspecified site, unspecified as acute or chronic, without mention of hemorrhage, perforation, or obstruction   . Unspecified cardiovascular disease   . Depressive disorder, not elsewhere classified   . Anxiety state, unspecified   . COPD (chronic obstructive pulmonary disease)   . GERD (gastroesophageal reflux disease)   . Candida esophagitis     on multiple occasions  . C. difficile diarrhea   . PVD (peripheral vascular disease)   . Headache    Past Surgical  History  Procedure Date  . Tonsillectomy 1974  . Fracture lt jaw 1976  . Breast surgery 1969, 1970    bilateral  . Epigastric hernia repair 2008  . Coronary artery bypass graft 1995  . Carpal tunnel release   . Total abdominal hysterectomy 1998  . Laminectomy   . Coronary angioplasty     multiple  . Subclavian vein angioplasty / stenting     reports that she quit smoking about 2 years ago. Her smoking use included Cigarettes. She quit after 30 years of use. She has never used smokeless tobacco. She reports that she does not drink alcohol or use illicit drugs. family history includes Heart disease in her father, mother, and sister and Lung cancer in her father.  There is no history of Colon cancer. Allergies  Allergen Reactions  . Amiodarone     unknown  . Clopidogrel Bisulfate     REACTION: hives        Review of Systems: Positive for dysphagia. Occasional heartburn for which she takes Pepcid and omeprazole. Occasional constipation  The remainder of the 10 point ROS is negative except as outlined in H&P   Physical Exam: General appearance  in no distress. Chronically ill-appearing, cachectic, smells of small Eyes- non icteric. HEENT nontraumatic, normocephalic. Deep voice Mouth no lesions, tongue papillated, no cheilosis. Neck supple without adenopathy, thyroid not enlarged, no carotid bruits, no JVD. Lungs Clear to auscultation bilaterally. Cor normal S1, normal S2, regular rhythm, no murmur,  quiet precordium. Abdomen: Scaphoid soft nontender with normoactive bowel sounds.  Liver edge at costal margin. No palpable mass. Rectal: Soft Hemoccult negative stool. Extremities no pedal edema. Skin no lesions. Neurological alert and oriented x 3. Psychological normal mood and affect. 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 Patient also has recurrent dysphagia indicating the possibility of a  recurrent esophageal stricture. She is due for a recall colonoscopy; her last one being 7 years ago. She had colon  polyp.There is no evidence of GI blood loss, her hemoglobin is normal . In order to improve her appetite, we will put her on  a low-dose prednisone 2 mg daily and give her Diflucan for possible yeast infection which she had on last EGD, probably from steroid inhalers. She will continue her intake of Ensure on a daily basis. She has just been put on Lyrica and it may help  her appetite and weight gain. She has a normal serum albumin which indicates that her visceral protein stores are normal which is a good sign.   09/29/2011 [name]

## 2011-09-29 NOTE — Patient Instructions (Signed)
You have been scheduled for an endoscopy and colonoscopy with propofol. Please follow the written instructions given to you at your visit today. Please pick up your prep at the pharmacy within the next 1-3 days. We have sent the following medications to your pharmacy for you to pick up at your convenience: Prednisone Diflucan CC: Dr Larina Earthly

## 2011-09-30 ENCOUNTER — Telehealth: Payer: Self-pay | Admitting: Internal Medicine

## 2011-09-30 MED ORDER — PEG-KCL-NACL-NASULF-NA ASC-C 100 G PO SOLR: 1.0000 | Freq: Once | ORAL | Status: DC

## 2011-09-30 MED ORDER — PREDNISONE 1 MG PO TABS: ORAL_TABLET | ORAL | Status: DC

## 2011-09-30 MED ORDER — FLUCONAZOLE 100 MG PO TABS: ORAL_TABLET | ORAL | Status: DC

## 2011-09-30 NOTE — Telephone Encounter (Signed)
I have spoken to patient, I went ahead and gave a verbal order on the prednisone, diflucan and moviprep since pharmacy state that they did not get the orders. I have also asked patient to hold Lipitor while taking the diflucan as it can cause a major interaction (myopathy and rhabdomyolysis) when taken together. Patient verbalizes understanding.

## 2011-09-30 NOTE — Telephone Encounter (Signed)
You will get a star for this. DB

## 2011-10-27 ENCOUNTER — Other Ambulatory Visit: Payer: Medicaid Other

## 2011-10-27 ENCOUNTER — Ambulatory Visit (INDEPENDENT_AMBULATORY_CARE_PROVIDER_SITE_OTHER): Payer: Medicaid Other | Admitting: Pulmonary Disease

## 2011-10-27 ENCOUNTER — Encounter: Payer: Self-pay | Admitting: Pulmonary Disease

## 2011-10-27 VITALS — BP 100/66 | HR 56 | Temp 98.1°F | Ht 66.0 in | Wt 101.2 lb

## 2011-10-27 DIAGNOSIS — J439 Emphysema, unspecified: Secondary | ICD-10-CM

## 2011-10-27 DIAGNOSIS — J449 Chronic obstructive pulmonary disease, unspecified: Secondary | ICD-10-CM | POA: Insufficient documentation

## 2011-10-27 DIAGNOSIS — J438 Other emphysema: Secondary | ICD-10-CM

## 2011-10-27 MED ORDER — AEROCHAMBER PLUS MISC: Status: DC

## 2011-10-27 NOTE — Progress Notes (Addendum)
Chief Complaint  Patient presents with  . Advice Only    switching from MW.--COPD   CC: [name]  History of Present Illness: [name] is a 61 y.o. female former smoker for evaluation of COPD.  She was previously followed by Dr. Sherene Sires.  She opted to have evaluation by another pulmonologist.    She has been using spiriva, singulair, and symbicort for some time.  She feels these inhalers help.  She does get episodes of thrush from her inhalers.  She rinses her mouth after using symbicort.  She does not have a spacer device.  She still gets occasional cough with clear to yellow sputum.  This is worst in the morning.  She denies hemoptysis, fever, sweats, or chest pain.  She gets occasional wheeze.  She is using her proventil once or twice per day, and this helps.  She has allergies to pollen and grasses.  She has never been on allergy shots.  She was told she has asthma before she was diagnosed with COPD.  She started smoking at age 4.  She smoked 1.5 packs per day until 2011.  She is disabled, but used to work in catering.  She denies any recent travel history or sick exposures.  She had pneumonia in 2001.  There is no history of exposure to tuberculosis.  She was started on prednisone by GI as appetite stimulant due to progressive weight loss.   Past Medical History  Diagnosis Date  . Acute myocardial infarction, unspecified site, episode of care unspecified   . Esophageal dysmotility   . Unspecified hemorrhoids without mention of complication   . Hemorrhoids   . Esophageal stricture   . Diaphragmatic hernia without mention of obstruction or gangrene   . 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 Coronary atherosclerosis of unspecified type of vessel, native or graft   . Peripheral neuropathy   . Other and unspecified hyperlipidemia   . Peptic ulcer, unspecified site, unspecified as acute or chronic, without mention of hemorrhage, perforation, or obstruction     . Unspecified cardiovascular disease   . Depressive disorder, not elsewhere classified   . Anxiety state, unspecified   . COPD (chronic obstructive pulmonary disease)   . GERD (gastroesophageal reflux disease)   . Candida esophagitis     on multiple occasions  . C. difficile diarrhea   . PVD (peripheral vascular disease)   . Headache     Past Surgical History  Procedure Date  . Tonsillectomy 1974  . Fracture lt jaw 1976  . Breast surgery 1969, 1970    bilateral  . Epigastric hernia repair 2008  . Coronary artery bypass graft 1995  . Carpal tunnel release   . Total abdominal hysterectomy 1998  . Laminectomy   . Coronary angioplasty     multiple  . Subclavian vein angioplasty / stenting     Current Outpatient Prescriptions on File Prior to Visit  Medication Sig Dispense Refill  . albuterol (PROVENTIL HFA) 108 (90 BASE) MCG/ACT inhaler Inhale 2 puffs into the lungs every 6 (six) hours as needed for wheezing.  1 Inhaler  1  . aspirin 81 MG EC tablet Take 81 mg by mouth daily.        Marland Kitchen atorvastatin (LIPITOR) 80 MG tablet Take 80 mg by mouth daily.      . budesonide-formoterol (SYMBICORT) 160-4.5 MCG/ACT inhaler Inhale 2 puffs into the lungs 2 (two) times daily.  1 Inhaler  3  . Cholecalciferol (VITAMIN D3)  5000 UNITS TABS Take 1 tablet by mouth daily.        Marland Kitchen dextromethorphan-guaiFENesin (MUCINEX DM) 30-600 MG per 12 hr tablet Take 1 tablet by mouth every 12 (twelve) hours as needed.       . ezetimibe (ZETIA) 10 MG tablet Take 10 mg by mouth daily.        . famotidine (PEPCID) 20 MG tablet One at bedtime  30 tablet  11  . fluticasone (FLONASE) 50 MCG/ACT nasal spray Place 2 sprays into the nose 2 (two) times daily.        Marland Kitchen HYDROcodone-acetaminophen (NORCO) 10-325 MG per tablet Take 1 tablet by mouth every 6 (six) hours as needed. 1-2 tablets every 4-6 hours as needed. For pain      . montelukast (SINGULAIR) 10 MG tablet Take 10 mg by mouth at bedtime.        . nebivolol  (BYSTOLIC) 2.5 MG tablet Take 1 tablet (2.5 mg total) by mouth daily.  30 tablet  0  . nitroGLYCERIN (NITROSTAT) 0.4 MG SL tablet Place 0.4 mg under the tongue every 5 (five) minutes as needed. For chest pain      . omeprazole (PRILOSEC) 20 MG capsule Take 20 mg by mouth 2 (two) times daily.        . peg 3350 powder (MOVIPREP) 100 G SOLR Take 1 kit (100 g total) by mouth once. As directed  1 kit  0  . prasugrel (EFFIENT) 10 MG TABS Take by mouth daily.        . predniSONE (DELTASONE) 1 MG tablet Take 2 tablets by mouth once daily  60 tablet  1  . tiotropium (SPIRIVA) 18 MCG inhalation capsule Place 18 mcg into inhaler and inhale daily.         Allergies  Allergen Reactions  . Amiodarone     unknown  . Clopidogrel Bisulfate     REACTION: hives    Family History  Problem Relation Age of Onset  . Lung cancer Father   . Heart disease Mother   . Heart disease Sister   . Heart disease Father   . Colon cancer Neg Hx   . Asthma Father   . Allergies Father     History  Substance Use Topics  . Smoking status: Former Smoker -- 1.5 packs/day for 30 years    Types: Cigarettes    Quit date: 05/24/2009  . Smokeless tobacco: Never Used  . Alcohol Use: No   Review of Systems  Constitutional: Positive for unexpected weight change. Negative for fever and appetite change.  HENT: Positive for congestion and trouble swallowing. Negative for ear pain, sore throat, sneezing and dental problem.   Respiratory: Positive for cough and shortness of breath.   Cardiovascular: Positive for chest pain and palpitations. Negative for leg swelling.  Gastrointestinal: Positive for abdominal pain.  Musculoskeletal: Negative for joint swelling.  Skin: Negative for rash.  Neurological: Negative for headaches.  Psychiatric/Behavioral: Negative for dysphoric mood. The patient is not nervous/anxious.     Physical Exam: Filed Vitals:   10/27/11 0854  BP: 100/66  Pulse: 56  Temp: 98.1 F (36.7 C)  TempSrc:  Oral  Height: 5\' 6"  (1.676 m)  Weight: 101 lb 3.2 oz (45.904 kg)  SpO2: 98%  ,  Current Encounter SPO2  10/27/11 0854 98%  09/03/11 1619 100%  09/03/11 1554 96%  08/25/11 0907 94%   Wt Readings from Last 3 Encounters:  10/27/11 101 lb 3.2 oz (45.904 kg)  09/29/11 102 lb (46.267 kg)  08/25/11 105 lb (47.628 kg)   Body mass index is 16.33 kg/(m^2).  General - No distress, thin ENT - Ears normal. Throat and pharynx normal. Neck supple. No adenopathy or masses in the neck or supraclavicular regions. Nasal septum midline, no deformities, nares patent, normal mucosa without swelling, no polyps, no bleeding. Paranasal sinuses are normal. No tenderness to the maxillary, frontal, ethmoids or mastoids. Cardiac - S1 and S2 normal, no murmurs, clicks, gallops or rubs. Regular rate and rhythm.  No edema or JVD. Chest - Chest is clear, no wheezing or rales. Normal symmetric air entry throughout both lung fields. No chest wall deformities or tenderness. Back - Cervical, thoracic and lumbar spine exam is normal without tenderness, masses or kyphoscoliosis. Full range of motion without pain is noted. Abd - soft without tenderness, guarding, mass, rebound or organomegaly. Bowel sounds are normal. No CVA tenderness noted. Ext - good pulses, motor strength and sensation. Neuro - Cranial nerves are normal. PERLA. EOM's intact. Skin - no discernible active dermatitis, erythema, urticaria or inflammatory process. Psych - normal mood, and behavior.   CHEST - 2 VIEW 09/03/11 Comparison: 08/30/2011  Findings: Normal heart size with clear lung fields. Prior CABG.  Prior thoracolumbar fusion. COPD with moderate hyperinflation. No effusion or pneumothorax.  IMPRESSION:  Stable chest. No active cardiopulmonary disease.  Original Report Authenticated By: Elsie Stain, M.D.  Echo 06/02/09>>EF 45 to 50%, mild inferior wall hypokinesis, grade 1 diastolic dysfunction, mild MR, mild LA dilation, mild RV  dilation, mild/mod TR, PAS 35 mmHg  CBC    Component Value Date/Time   WBC 8.9 09/03/2011 1611   RBC 3.86* 09/03/2011 1611   HGB 12.8 09/03/2011 1611   HCT 37.1 09/03/2011 1611   PLT 218 09/03/2011 1611   MCV 96.1 09/03/2011 1611   MCH 33.2 09/03/2011 1611   MCHC 34.5 09/03/2011 1611   RDW 12.6 09/03/2011 1611   LYMPHSABS 2.1 07/31/2010 1039   MONOABS 0.5 07/31/2010 1039   EOSABS 0.0 07/31/2010 1039   BASOSABS 0.0 07/31/2010 1039    BMET    Component Value Date/Time   NA 137 09/03/2011 1611   K 4.5 09/03/2011 1611   CL 101 09/03/2011 1611   CO2 28 09/03/2011 1611   GLUCOSE 88 09/03/2011 1611   BUN 9 09/03/2011 1611   CREATININE 0.67 09/03/2011 1611   CALCIUM 9.2 09/03/2011 1611   GFRNONAA >90 09/03/2011 1611   GFRAA >90 09/03/2011 1611    Lab Results  Component Value Date   ALT 15 07/31/2010   AST 17 07/31/2010   ALKPHOS 105 07/31/2010   BILITOT 0.7 07/31/2010   ABG    Component Value Date/Time   PHART 7.361 08/04/2010 1304   PCO2ART 44.3 08/04/2010 1304   PO2ART 444.0* 08/04/2010 1304   HCO3 25.6* 08/04/2010 1304   TCO2 27 08/04/2010 1304   ACIDBASEDEF 1.0 08/04/2010 1304   O2SAT 100.0 08/04/2010 1304    PFT 06/15/10>>FEV1 1.54 (61%), FEV1% 55, TLC 5.07 (95%), DLCO 62%, no BD  Assessment/Plan:  Coralyn Helling, MD [facility] Pager:  [phone] 10/27/2011, 9:20 AM  Patient Instructions  Blood test today Follow up in 6 months

## 2011-10-27 NOTE — Progress Notes (Deleted)
  Subjective:    Patient ID: [name], female    DOB: February 24, 1951, 61 y.o.   MRN: [id number]  HPI    Review of Systems  Constitutional: Positive for unexpected weight change. Negative for fever and appetite change.  HENT: Positive for congestion and trouble swallowing. Negative for ear pain, sore throat, sneezing and dental problem.   Respiratory: Positive for cough and shortness of breath.   Cardiovascular: Positive for chest pain and palpitations. Negative for leg swelling.  Gastrointestinal: Positive for abdominal pain.  Musculoskeletal: Negative for joint swelling.  Skin: Negative for rash.  Neurological: Negative for headaches.  Psychiatric/Behavioral: Negative for dysphoric mood. The patient is not nervous/anxious.        Objective:   Physical Exam        Assessment & Plan:

## 2011-10-27 NOTE — Patient Instructions (Signed)
Blood test today  Follow up in 6 months 

## 2011-10-27 NOTE — Assessment & Plan Note (Signed)
She has prior history of smoking.  She has moderate obstruction with diffusion defect on PFT from January 2012.  She is well controlled on her current inhaler regimen.  She reports history of asthma and allergies.  Will continue singulair for now.  Will arrange for spacer device.  Discussed pulmonary rehab>>she wants to check with cardiology first.  Will check Alpha 1 anti-trypsin level.  She is on prednisone per GI for weight loss.

## 2011-11-04 LAB — ALPHA-1 ANTITRYPSIN PHENOTYPE: A-1 Antitrypsin: 175 mg/dL (ref 83–199)

## 2011-11-05 ENCOUNTER — Telehealth: Payer: Self-pay | Admitting: Pulmonary Disease

## 2011-11-05 DIAGNOSIS — J439 Emphysema, unspecified: Secondary | ICD-10-CM

## 2011-11-05 NOTE — Telephone Encounter (Signed)
A1AT 10/27/11 >> 175 (nl 83 - 199), MM  Will have my nurse inform patient that blood test was negative/normal for inherited cause of emphysema.  No change to current treatment plan.

## 2011-11-08 NOTE — Telephone Encounter (Signed)
lmomtcb x1 for pt 

## 2011-11-08 NOTE — Telephone Encounter (Signed)
Patient returning call. [phone]

## 2011-11-09 NOTE — Telephone Encounter (Signed)
I spoke with patient about results and she verbalized understanding and had no questions 

## 2011-11-24 ENCOUNTER — Ambulatory Visit (AMBULATORY_SURGERY_CENTER): Payer: Medicaid Other | Admitting: Internal Medicine

## 2011-11-24 ENCOUNTER — Encounter: Payer: Self-pay | Admitting: Internal Medicine

## 2011-11-24 VITALS — BP 118/56 | HR 75 | Temp 95.4°F | Resp 16 | Ht 66.0 in | Wt 102.0 lb

## 2011-11-24 DIAGNOSIS — K2940 Chronic atrophic gastritis without bleeding: Secondary | ICD-10-CM

## 2011-11-24 DIAGNOSIS — D126 Benign neoplasm of colon, unspecified: Secondary | ICD-10-CM

## 2011-11-24 DIAGNOSIS — Z86010 Personal history of colonic polyps: Secondary | ICD-10-CM

## 2011-11-24 DIAGNOSIS — R1319 Other dysphagia: Secondary | ICD-10-CM

## 2011-11-24 DIAGNOSIS — K2970 Gastritis, unspecified, without bleeding: Secondary | ICD-10-CM

## 2011-11-24 DIAGNOSIS — R634 Abnormal weight loss: Secondary | ICD-10-CM

## 2011-11-24 DIAGNOSIS — K2990 Gastroduodenitis, unspecified, without bleeding: Secondary | ICD-10-CM

## 2011-11-24 MED ORDER — SODIUM CHLORIDE 0.9 % IV SOLN: 500.0000 mL | INTRAVENOUS | Status: DC

## 2011-11-24 NOTE — Op Note (Signed)
[facility] [address] [place], [place]  [address]  COLONOSCOPY PROCEDURE REPORT  PATIENT:  Whitney, [name]  MR#:  [id number] BIRTHDATE:  Jan 03, 1951, 60 yrs. old  GENDER:  female ENDOSCOPIST:  Hedwig Morton. [name], MD REF. BY:  Chilton Greathouse, M.D. PROCEDURE DATE:  11/24/2011 PROCEDURE:  Colonoscopy with snare polypectomy ASA CLASS:  Class III INDICATIONS:  weight loss last colon 2006- polyp MEDICATIONS:   MAC sedation, administered by CRNA, propofol (Diprivan) 120 mg  DESCRIPTION OF PROCEDURE:   After the risks and benefits and of the procedure were explained, informed consent was obtained. Digital rectal exam was performed and revealed no rectal masses. The LB [id number]-H180AL [id number] endoscope was introduced through the anus and advanced to the cecum, which was identified by both the appendix and ileocecal valve.  The quality of the prep was good, using MoviPrep.  The instrument was then slowly withdrawn as the colon was fully examined. <<PROCEDUREIMAGES>>  FINDINGS:  A pedunculated polyp was found. at 70 cm 15 mm polyp Polyp was snared, then cauterized with monopolar cautery. Retrieval was successful (see image2 and image1). snare polyp This was otherwise a normal examination of the colon (see image3, image4, image5, and image6).   Retroflexed views in the rectum revealed no abnormalities.    The scope was then withdrawn from the patient and the procedure completed.  COMPLICATIONS:  None ENDOSCOPIC IMPRESSION: 1) Pedunculated polyp 2) Otherwise normal examination RECOMMENDATIONS: 1) Await pathology results 2) High fiber diet. continue Prednisone 2 mg qd, cont Ensure  REPEAT EXAM:  In 5 year(s) for.  ______________________________ Hedwig Morton. [name], MD  CC:  n. eSIGNED:   Hedwig Morton. Brodie at 11/24/2011 02:31 PM  Nicholaus Corolla, [phone]

## 2011-11-24 NOTE — Progress Notes (Signed)
1416 PATIENT CRYING COMPLAINS OF INABILITY TO BREATH THROUGH HER NOSE AND DRYNESS OF MOUTH. PATIENT VERY ANXIOUS, 02 RESTARTED AT 2L/MIN. CALMING REASSURANCE TO PATIENT AND HUSBAND.WET CLOTH TO MOUTH.  PATIENT CRYING, RAPID RESPIRATIONS, 26-30,COUGHING,NONPRODUCTIVE. 1420 IMPROVING RESPIRATION AND AND PATIENT CALMNESS. 1432DR. BRODIE IN TO SEE THE PATIENT. WATER GIVEN TO THE PATIENT, LESS THAN 4 OZ.

## 2011-11-24 NOTE — Op Note (Signed)
[facility] [address] [place], [place]  [address]  ENDOSCOPY PROCEDURE REPORT  PATIENT:  [name], [name]  MR#:  [id number] BIRTHDATE:  12-11-1950, 60 yrs. old  GENDER:  female  ENDOSCOPIST:  Hedwig Morton. [name], MD Referred by:  Chilton Greathouse, M.D.  PROCEDURE DATE:  11/24/2011 PROCEDURE:  EGD with biopsy, [address] ASA CLASS:  Class III INDICATIONS:  weight loss, dysphagia severe COPD, wgt loss, prior evaluation in 2011, EGD 2011 - candida esophagitis  MEDICATIONS:   MAC sedation, administered by CRNA, propofol (Diprivan) 80 mg TOPICAL ANESTHETIC:  Cetacaine Spray  DESCRIPTION OF PROCEDURE:   After the risks benefits and alternatives of the procedure were thoroughly explained, informed consent was obtained.  The LB GIF-H180 [id number] endoscope was introduced through the mouth and advanced to the second portion of the duodenum, without limitations.  The instrument was slowly withdrawn as the mucosa was fully examined. <<PROCEDUREIMAGES>>  Mild gastritis was found in the antrum. focal erythema antrum With standard forceps, a biopsy was obtained and sent to pathology (see image3).  Otherwise the examination was normal (see image1, image2, image4, image5, and image6).    Retroflexed views revealed no abnormalities.    The scope was then withdrawn from the patient and the procedure completed.  COMPLICATIONS:  None  ENDOSCOPIC IMPRESSION: 1) Mild gastritis in the antrum 2) Otherwise normal examination nothing to account for the weight loss RECOMMENDATIONS: 1) Await biopsy results colonoscopy  REPEAT EXAM:  In 0 year(s) for.  ______________________________ Hedwig Morton. [name], MD  CC:  n. eSIGNED:   Hedwig Morton. Kathryne Ramella at 11/24/2011 02:26 PM  Nicholaus Corolla, [phone]

## 2011-11-24 NOTE — Patient Instructions (Addendum)
AWAIT BIOPSY RESULTS.  CONTINUE YOUR PREDNISONE.  CONTINUE ENSURE.  YOU HAD AN ENDOSCOPIC PROCEDURE TODAY AT THE [facility]: Refer to the procedure report that was given to you for any specific questions about what was found during the examination.  If the procedure report does not answer your questions, please call your gastroenterologist to clarify.  If you requested that your care partner not be given the details of your procedure findings, then the procedure report has been included in a sealed envelope for you to review at your convenience later.  YOU SHOULD EXPECT: Some feelings of bloating in the abdomen. Passage of more gas than usual.  Walking can help get rid of the air that was put into your GI tract during the procedure and reduce the bloating. If you had a lower endoscopy (such as a colonoscopy or flexible sigmoidoscopy) you may notice spotting of blood in your stool or on the toilet paper. If you underwent a bowel prep for your procedure, then you may not have a normal bowel movement for a few days.  DIET: Your first meal following the procedure should be a light meal and then it is ok to progress to your normal diet.  A half-sandwich or bowl of soup is an example of a good first meal.  Heavy or fried foods are harder to digest and may make you feel nauseous or bloated.  Likewise meals heavy in dairy and vegetables can cause extra gas to form and this can also increase the bloating.  Drink plenty of fluids but you should avoid alcoholic beverages for 24 hours.  ACTIVITY: Your care partner should take you home directly after the procedure.  You should plan to take it easy, moving slowly for the rest of the day.  You can resume normal activity the day after the procedure however you should NOT DRIVE or use heavy machinery for 24 hours (because of the sedation medicines used during the test).    SYMPTOMS TO REPORT IMMEDIATELY: A gastroenterologist can be reached at any hour.   During normal business hours, 8:30 AM to 5:00 PM Monday through Friday, call [phone].  After hours and on weekends, please call the GI answering service at [phone] who will take a message and have the physician on call contact you.   Following lower endoscopy (colonoscopy or flexible sigmoidoscopy):  Excessive amounts of blood in the stool  Significant tenderness or worsening of abdominal pains  Swelling of the abdomen that is new, acute  Fever of 100F or higher  Following upper endoscopy (EGD)  Vomiting of blood or coffee ground material  New chest pain or pain under the shoulder blades  Painful or persistently difficult swallowing  New shortness of breath  Fever of 100F or higher  Black, tarry-looking stools  FOLLOW UP: If any biopsies were taken you will be contacted by phone or by letter within the next 1-3 weeks.  Call your gastroenterologist if you have not heard about the biopsies in 3 weeks.  Our staff will call the home number listed on your records the next business day following your procedure to check on you and address any questions or concerns that you may have at that time regarding the information given to you following your procedure. This is a courtesy call and so if there is no answer at the home number and we have not heard from you through the emergency physician on call, we will assume that you have returned to your  regular daily activities without incident.  SIGNATURES/CONFIDENTIALITY: You and/or your care partner have signed paperwork which will be entered into your electronic medical record.  These signatures attest to the fact that that the information above on your After Visit Summary has been reviewed and is understood.  Full responsibility of the confidentiality of this discharge information lies with you and/or your care-partner. 

## 2011-11-24 NOTE — Progress Notes (Signed)
PATIENT SITTING AT 45 DEGREES, RESPIRATIONS NORMAL , SKIN CONTINUES TO BE WARM AND DRY. NO DISTRESS. PATIENT IS CALM.1443 OOB TO BATHROOM AT PATIENT'S REQUEST. VOIDED LARGE AMOUNT OF FLATUS, FLATUS PASSED. ASSISTED PATIENT TO DRESS IN BATHROOM. 1455 DISCHARGE INSTRUCTIONS GIVEN TO PATIENT AND FAMILY. 1505 PATIENT SEEN BY JOHN NULTY CRNA, DISCHARGE APPROVED [phone]. SA02 95% ON ROOM AIR. NO DYSPNEA, SKIN WARM DRY AND PINK.

## 2011-11-24 NOTE — Progress Notes (Signed)
Patient did not experience any of the following events: a burn prior to discharge; a fall within the facility; wrong site/side/patient/procedure/implant event; or a hospital transfer or hospital admission upon discharge from the facility. (G8907) Patient did not have preoperative order for IV antibiotic SSI prophylaxis. (G8918)  

## 2011-11-26 ENCOUNTER — Telehealth: Payer: Self-pay | Admitting: *Deleted

## 2011-11-26 NOTE — Telephone Encounter (Signed)
  Follow up Call-  Call back number 11/24/2011  Post procedure Call Back phone  # [phone]  Permission to leave phone message Yes     Patient questions:  Do you have a fever, pain , or abdominal swelling? no Pain Score  0 *  Have you tolerated food without any problems? yes  Have you been able to return to your normal activities? yes  Do you have any questions about your discharge instructions: Diet   no Medications  no Follow up visit  no  Do you have questions or concerns about your Care? no  Actions: * If pain score is 4 or above: No action needed, pain <4.Pt did say her throat is still a little sore ,says she is not running a fever and is eating and drinking ok, also says she is still a little bloated. Encouraged pt to stay away from gas forming foods ,to drink warm liquids to help expel gas and to call us back if these things do not keep improving

## 2011-11-29 ENCOUNTER — Encounter: Payer: Self-pay | Admitting: Internal Medicine

## 2011-11-29 ENCOUNTER — Other Ambulatory Visit: Payer: Self-pay | Admitting: Internal Medicine

## 2011-12-19 ENCOUNTER — Other Ambulatory Visit: Payer: Self-pay | Admitting: Internal Medicine

## 2011-12-29 ENCOUNTER — Other Ambulatory Visit: Payer: Self-pay | Admitting: Internal Medicine

## 2012-02-03 ENCOUNTER — Other Ambulatory Visit: Payer: Self-pay | Admitting: *Deleted

## 2012-02-03 ENCOUNTER — Other Ambulatory Visit: Payer: Self-pay | Admitting: Thoracic Surgery (Cardiothoracic Vascular Surgery)

## 2012-02-03 ENCOUNTER — Institutional Professional Consult (permissible substitution) (INDEPENDENT_AMBULATORY_CARE_PROVIDER_SITE_OTHER): Payer: Medicaid Other | Admitting: Thoracic Surgery (Cardiothoracic Vascular Surgery)

## 2012-02-03 VITALS — BP 105/62 | HR 72 | Temp 97.4°F | Resp 18 | Ht 67.0 in | Wt 100.0 lb

## 2012-02-03 DIAGNOSIS — R918 Other nonspecific abnormal finding of lung field: Secondary | ICD-10-CM

## 2012-02-03 DIAGNOSIS — R222 Localized swelling, mass and lump, trunk: Secondary | ICD-10-CM

## 2012-02-03 NOTE — Progress Notes (Signed)
PCP is AVVA,RAVISANKAR R, MD Referring Provider is Avva, Ravisankar R, MD  Chief Complaint  Patient presents with  . Lung Mass    new lung mass CT at [facility]    HPI: 61-year-old woman presents with chief complaint of lung mass.  [name] is a 61-year-old woman well-known to me from previous care of both her and her husband. She recently saw Dr. Avva to be evaluated for weight loss. She says she has lost about 21 pounds over the past 6 weeks. 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 A CT of the chest was done at [facility] on 01/31/2012. It showed a 2.7 x 3.2 x 2.8 cm spiculated mass in the inferior medial aspect of the right upper lobe abutting the major fissure there also was a 1.5 cm anterior upper mediastinal nodule other lymph nodes or characterizes not enlarged. There also was extensive calcific atherosclerotic vascular disease..  She does get chest tightness with exertion which he says is reminiscent of her angina prior to her coronary bypass grafting by Dr. Wilson many years ago. Apparently she had a catheterization a year ago which showed no significant lesions. She had a 60-pack-year history of smoking prior to her coronary bypass grafting, but quit in 1995 after the procedure. She does complain of shortness of breath with moderate exertion. Her physical activities are markedly limited by chronic pain following back surgery. She does have frequent wheezing.   Past Medical History  Diagnosis Date  . Acute myocardial infarction, unspecified site, episode of care unspecified   . Esophageal dysmotility   . Unspecified hemorrhoids without mention of complication   . Hemorrhoids   . Esophageal stricture   . Diaphragmatic hernia without mention of obstruction or gangrene   . 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  Coronary atherosclerosis of unspecified type of vessel, native or graft   . Peripheral neuropathy   . Other and unspecified hyperlipidemia   . Peptic ulcer, unspecified site, unspecified as acute or chronic, without mention of hemorrhage, perforation, or obstruction   . Unspecified cardiovascular disease   . Depressive disorder, not elsewhere classified   . Anxiety state, unspecified   . COPD (chronic obstructive pulmonary disease)   . GERD (gastroesophageal reflux disease)   . Candida esophagitis     on multiple occasions  . C. difficile diarrhea   . PVD (peripheral vascular disease)   . Headache   . Weight loss 06/13/2011    scheduled here by family doctor    Past Surgical History  Procedure Date  . Tonsillectomy 1974  . Fracture lt jaw 1976  . Breast surgery 1969, 1970    bilateral  . Epigastric hernia repair 2008    Dr Makailyn Mccormick  . Coronary artery bypass graft 1995  . Carpal tunnel release   . Total abdominal hysterectomy 1998  . Laminectomy   . Coronary angioplasty     multiple  . Subclavian vein angioplasty / stenting     Family History  Problem Relation Age of Onset  . Lung cancer Father   . Heart disease Mother   . Heart disease Sister   . Heart disease Father   . Colon cancer Neg Hx   . Asthma Father   . Allergies Father     Social History History  Substance Use Topics  . Smoking   status: Former Smoker -- 1.5 packs/day for 30 years    Types: Cigarettes    Quit date: 05/24/2009  . Smokeless tobacco: Never Used  . Alcohol Use: No    Current Outpatient Prescriptions  Medication Sig Dispense Refill  . aspirin 81 MG EC tablet Take 81 mg by mouth daily.        . atorvastatin (LIPITOR) 80 MG tablet Take 80 mg by mouth daily.      . Cholecalciferol (VITAMIN D3) 5000 UNITS TABS Take 1 tablet by mouth daily.        . dextromethorphan-guaiFENesin (MUCINEX DM) 30-600 MG per 12 hr tablet Take 1 tablet by mouth every 12 (twelve) hours as needed.       .  ezetimibe (ZETIA) 10 MG tablet Take 10 mg by mouth daily.        . famotidine (PEPCID) 20 MG tablet One at bedtime  30 tablet  11  . fluticasone (FLONASE) 50 MCG/ACT nasal spray Place 2 sprays into the nose 2 (two) times daily.        . HYDROcodone-acetaminophen (NORCO) 10-325 MG per tablet Take 1 tablet by mouth every 6 (six) hours as needed. 1-2 tablets every 4-6 hours as needed. For pain      . montelukast (SINGULAIR) 10 MG tablet Take 10 mg by mouth at bedtime.        . nebivolol (BYSTOLIC) 2.5 MG tablet Take 1 tablet (2.5 mg total) by mouth daily.  30 tablet  0  . nitroGLYCERIN (NITROSTAT) 0.4 MG SL tablet Place 0.4 mg under the tongue every 5 (five) minutes as needed. For chest pain      . omeprazole (PRILOSEC) 20 MG capsule Take 20 mg by mouth 2 (two) times daily.        . prasugrel (EFFIENT) 10 MG TABS Take by mouth daily.        . predniSONE (DELTASONE) 1 MG tablet Take 2 tablets by mouth once daily  60 tablet  1  . PROAIR HFA 108 (90 BASE) MCG/ACT inhaler INHALE 2 PUFFS INTO THE LUNGS EVERY 6 HOURS AS NEEDED FOR WHEEZING.  8.5 g  4  . Spacer/Aero-Holding Chambers (AEROCHAMBER PLUS) inhaler Use as instructed  1 each  0  . SYMBICORT 160-4.5 MCG/ACT inhaler INHALE 2 PUFFS INTO THE LUNGS 2 TIMES DAILY.  10.2 g  4  . tiotropium (SPIRIVA HANDIHALER) 18 MCG inhalation capsule Place 1 capsule (18 mcg total) into inhaler and inhale daily.  30 each  5    Allergies  Allergen Reactions  . Amiodarone     Drops HR too low  . Clopidogrel Bisulfate     REACTION: hives    Review of Systems  Constitutional: Positive for unexpected weight change (21 lb wt loss over 6 weeks). Negative for fever.  Respiratory: Positive for cough (nonproductive), chest tightness (pleuritic CP), shortness of breath and wheezing.   Cardiovascular: Positive for chest pain (similar to previous angina).       Claudication  Gastrointestinal:       Reflux, heartburn,constipation, abdominal pain  Musculoskeletal: Positive  for arthralgias and gait problem.  Psychiatric/Behavioral:       Anxiety  All other systems reviewed and are negative.    BP 105/62  Pulse 72  Temp 97.4 F (36.3 C)  Resp 18  Ht 5' 7" (1.702 m)  Wt 100 lb (45.36 kg)  BMI 15.66 kg/m2  SpO2 97% Physical Exam  Vitals reviewed. Constitutional: She is oriented to person, place, and   time. No distress.       cachectic  HENT:  Head: Normocephalic and atraumatic.  Eyes: EOM are normal. Pupils are equal, round, and reactive to light.  Neck: Neck supple. No thyromegaly present.  Cardiovascular: Normal rate, regular rhythm and normal heart sounds.  Exam reveals no gallop and no friction rub.   No murmur heard. Pulmonary/Chest: She has no wheezes. She has no rales.  Abdominal: Soft. There is tenderness (mild).  Musculoskeletal: She exhibits no edema.  Lymphadenopathy:    She has no cervical adenopathy.  Neurological: She is alert and oriented to person, place, and time. No cranial nerve deficit.  Skin: Skin is warm and dry.     Diagnostic Tests: CT of chest 01/31/2012-findings as noted in history of present illness. I do think there is a little more fullness to the paratracheal nodes than was noted in the report.  Impression: 61-year-old with history of tobacco abuse and COPD who presents with a 21 pound weight loss over 6 weeks, now weighing 98 pounds. 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 She has some mild hilar or mediastinal adenopathy, but not definitive stage III disease. She is very questionable in terms of being a candidate for surgical resection due to her extensive cardiac history, severe weight loss, and general deconditioning.  We need to proceed with a diagnostic and staging workup of the mass.  1. Pulmonary function testing with room air blood gas  2 PET/CT for radiographic staging  3. MR of the brain to  rule out brain metastases.  4. Bronchoscopy and endobronchial ultrasound to biopsy the mass and sample the mediastinal lymph nodes.  I discussed with the patient and her family the indications, risks, benefits, and alternatives for diagnosing this lesion. They understand the endoscopy and endobronchial ultrasound will be performed under general anesthesia via endotracheal tube an outpatient basis. They understand the risk include those related general anesthesia including but not limited to MI or death. They understand that there is a risk of DVT, PE, MI, bleeding, pneumothorax, and respiratory failure related to the procedure itself. She accepts these risks and wishes to proceed  I advised her to stop effient 5 days prior to the procedure. She informed me that she had stopped this on her own 3 weeks ago  Plan: PFTs and radiographic studies as noted above.  Bronchoscopy and endobronchial ultrasound on Thursday, September 19  

## 2012-02-04 ENCOUNTER — Encounter (HOSPITAL_COMMUNITY): Payer: Self-pay | Admitting: Pharmacy Technician

## 2012-02-07 ENCOUNTER — Encounter (HOSPITAL_COMMUNITY): Payer: Self-pay

## 2012-02-07 ENCOUNTER — Encounter (HOSPITAL_COMMUNITY)
Admission: RE | Admit: 2012-02-07 | Discharge: 2012-02-07 | Disposition: A | Discharge status: Home / Self Care | Payer: Medicaid Other | Source: Ambulatory Visit | Attending: Thoracic Surgery (Cardiothoracic Vascular Surgery) | Admitting: Thoracic Surgery (Cardiothoracic Vascular Surgery)

## 2012-02-07 VITALS — BP 105/64 | HR 65 | Temp 98.2°F | Resp 20 | Ht 67.0 in | Wt 97.9 lb

## 2012-02-07 DIAGNOSIS — R918 Other nonspecific abnormal finding of lung field: Secondary | ICD-10-CM

## 2012-02-07 HISTORY — DX: Essential (primary) hypertension: I10

## 2012-02-07 HISTORY — DX: Heart failure, unspecified: I50.9

## 2012-02-07 HISTORY — DX: Unspecified asthma, uncomplicated: J45.909

## 2012-02-07 LAB — APTT: aPTT: 37 seconds (ref 24–37)

## 2012-02-07 LAB — CBC
Hemoglobin: 14.7 g/dL (ref 12.0–15.0)
MCH: 33.1 pg (ref 26.0–34.0)
MCHC: 34.8 g/dL (ref 30.0–36.0)
Platelets: 252 10*3/uL (ref 150–400)
RDW: 12.3 % (ref 11.5–15.5)

## 2012-02-07 LAB — COMPREHENSIVE METABOLIC PANEL
ALT: 13 U/L (ref 0–35)
AST: 18 U/L (ref 0–37)
Albumin: 4.4 g/dL (ref 3.5–5.2)
Alkaline Phosphatase: 106 U/L (ref 39–117)
Calcium: 10.4 mg/dL (ref 8.4–10.5)
Potassium: 4.2 mEq/L (ref 3.5–5.1)
Sodium: 139 mEq/L (ref 135–145)
Total Protein: 7.9 g/dL (ref 6.0–8.3)
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
## 2012-02-07 NOTE — Pre-Procedure Instructions (Signed)
20 GAYLEEN SHOLTZ  02/07/2012   Your procedure is scheduled on:  Thursday, 02/10/2012@7 :30AM.  Report to [facility] at 5:30 AM.  Call this number if you have problems the morning of surgery: [phone]   Remember:   Do not eat food:After Midnight.   Take these medicines the morning of surgery with A SIP OF WATER: Albuterol-if needed(bring inhaler to hospital), Symbicort, Pepcid, Flonase,Norco-if needed,                                                 Nebivolol(Bystolic),Prilosec, Nitrostat, Spiriva   Do not wear jewelry, make-up or nail polish.  Do not wear lotions, powders, or perfumes. You may wear deodorant.  Do not shave 48 hours prior to surgery. Men may shave face and neck.  Do not bring valuables to the hospital.  Contacts, dentures or bridgework may not be worn into surgery.  Leave suitcase in the car. After surgery it may be brought to your room.  For patients admitted to the hospital, checkout time is 11:00 AM the day of discharge.   Patients discharged the day of surgery will not be allowed to drive home.  Name and phone number of your driver:   Special Instructions: CHG Shower Use Special Wash: 1/2 bottle night before surgery and 1/2 bottle morning of surgery.   Please read over the following fact sheets that you were given: Pain Booklet, Coughing and Deep Breathing and Surgical Site Infection Prevention

## 2012-02-07 NOTE — Progress Notes (Addendum)
Pt here for preadmission appt.  Reports having blood work at PCP: Dr. [phone]).  Office called and reported bloodwork brawn 01/21/2011-greater than 14 days.Pt's cardiologist: Dr. Christiane Ha Berry(#[phone], [phone]). Called office & requested copies of recent office visit, EKG, stress test, heart caths, and heart clearance from receptionist Phenece.//L. Kayin Kettering,RN

## 2012-02-08 ENCOUNTER — Ambulatory Visit (HOSPITAL_COMMUNITY)
Admission: RE | Admit: 2012-02-08 | Discharge: 2012-02-08 | Disposition: A | Discharge status: Home / Self Care | Payer: Medicaid Other | Source: Ambulatory Visit | Attending: Thoracic Surgery (Cardiothoracic Vascular Surgery) | Admitting: Thoracic Surgery (Cardiothoracic Vascular Surgery)

## 2012-02-08 DIAGNOSIS — R918 Other nonspecific abnormal finding of lung field: Secondary | ICD-10-CM

## 2012-02-08 DIAGNOSIS — Z01812 Encounter for preprocedural laboratory examination: Secondary | ICD-10-CM | POA: Insufficient documentation

## 2012-02-08 DIAGNOSIS — J3489 Other specified disorders of nose and nasal sinuses: Secondary | ICD-10-CM | POA: Insufficient documentation

## 2012-02-08 DIAGNOSIS — Z951 Presence of aortocoronary bypass graft: Secondary | ICD-10-CM | POA: Insufficient documentation

## 2012-02-08 DIAGNOSIS — R222 Localized swelling, mass and lump, trunk: Secondary | ICD-10-CM | POA: Insufficient documentation

## 2012-02-08 DIAGNOSIS — Z01818 Encounter for other preprocedural examination: Secondary | ICD-10-CM | POA: Insufficient documentation

## 2012-02-08 LAB — PULMONARY FUNCTION TEST

## 2012-02-08 MED ORDER — GADOBENATE DIMEGLUMINE 529 MG/ML IV SOLN
9.0000 mL | Freq: Once | INTRAVENOUS | Status: AC | PRN
  Administered 2012-02-08: 9 mL via INTRAVENOUS

## 2012-02-08 MED ORDER — ALBUTEROL SULFATE (5 MG/ML) 0.5% IN NEBU
2.5000 mg | INHALATION_SOLUTION | Freq: Once | RESPIRATORY_TRACT | Status: AC
  Administered 2012-02-08: 2.5 mg via RESPIRATORY_TRACT

## 2012-02-09 NOTE — Progress Notes (Signed)
NOTIFIED MR.  Klare OF TIME CHANGE AND TO ARRIVE AT 830 AM  ON 02/10/12.

## 2012-02-10 ENCOUNTER — Ambulatory Visit (HOSPITAL_COMMUNITY)
Admission: RE | Admit: 2012-02-10 | Discharge: 2012-02-10 | Disposition: A | Discharge status: Home / Self Care | Payer: Medicaid Other | Source: Ambulatory Visit | Attending: Thoracic Surgery (Cardiothoracic Vascular Surgery) | Admitting: Thoracic Surgery (Cardiothoracic Vascular Surgery)

## 2012-02-10 ENCOUNTER — Encounter (HOSPITAL_COMMUNITY)
Admission: RE | Disposition: A | Discharge status: Home / Self Care | Payer: Self-pay | Source: Ambulatory Visit | Attending: Thoracic Surgery (Cardiothoracic Vascular Surgery)

## 2012-02-10 ENCOUNTER — Encounter (HOSPITAL_COMMUNITY): Payer: Self-pay | Admitting: Certified Registered"

## 2012-02-10 ENCOUNTER — Encounter (HOSPITAL_COMMUNITY): Payer: Self-pay | Admitting: *Deleted

## 2012-02-10 ENCOUNTER — Ambulatory Visit (HOSPITAL_COMMUNITY): Payer: Medicaid Other | Admitting: Certified Registered"

## 2012-02-10 ENCOUNTER — Ambulatory Visit (HOSPITAL_COMMUNITY): Payer: Medicaid Other

## 2012-02-10 DIAGNOSIS — C3410 Malignant neoplasm of upper lobe, unspecified bronchus or lung: Secondary | ICD-10-CM | POA: Insufficient documentation

## 2012-02-10 DIAGNOSIS — J449 Chronic obstructive pulmonary disease, unspecified: Secondary | ICD-10-CM | POA: Insufficient documentation

## 2012-02-10 DIAGNOSIS — D381 Neoplasm of uncertain behavior of trachea, bronchus and lung: Secondary | ICD-10-CM

## 2012-02-10 DIAGNOSIS — J4489 Other specified chronic obstructive pulmonary disease: Secondary | ICD-10-CM | POA: Insufficient documentation

## 2012-02-10 DIAGNOSIS — R918 Other nonspecific abnormal finding of lung field: Secondary | ICD-10-CM

## 2012-02-10 DIAGNOSIS — I10 Essential (primary) hypertension: Secondary | ICD-10-CM | POA: Insufficient documentation

## 2012-02-10 SURGERY — BRONCHOSCOPY, WITH EBUS
Anesthesia: General | Wound class: Clean Contaminated

## 2012-02-10 MED ORDER — HYDROCODONE-ACETAMINOPHEN 10-325 MG PO TABS
1.0000 | ORAL_TABLET | Freq: Once | ORAL | Status: AC
  Administered 2012-02-10: 1 via ORAL
  Filled 2012-02-10: qty 1

## 2012-02-10 MED ORDER — NEOSTIGMINE METHYLSULFATE 1 MG/ML IJ SOLN
INTRAMUSCULAR | Status: DC | PRN
  Administered 2012-02-10: 3 mg via INTRAVENOUS

## 2012-02-10 MED ORDER — HYDROMORPHONE HCL PF 1 MG/ML IJ SOLN
INTRAMUSCULAR | Status: AC
  Filled 2012-02-10: qty 1

## 2012-02-10 MED ORDER — PROPOFOL 10 MG/ML IV BOLUS
INTRAVENOUS | Status: DC | PRN
  Administered 2012-02-10: 130 mg via INTRAVENOUS

## 2012-02-10 MED ORDER — EPINEPHRINE HCL 1 MG/ML IJ SOLN
INTRAMUSCULAR | Status: DC | PRN
  Administered 2012-02-10: 1 mL

## 2012-02-10 MED ORDER — ROCURONIUM BROMIDE 100 MG/10ML IV SOLN
INTRAVENOUS | Status: DC | PRN
  Administered 2012-02-10: 10 mg via INTRAVENOUS
  Administered 2012-02-10: 25 mg via INTRAVENOUS
  Administered 2012-02-10: 10 mg via INTRAVENOUS

## 2012-02-10 MED ORDER — ONDANSETRON HCL 4 MG/2ML IJ SOLN: 4.0000 mg | Freq: Once | INTRAMUSCULAR | Status: DC | PRN

## 2012-02-10 MED ORDER — GLYCOPYRROLATE 0.2 MG/ML IJ SOLN
INTRAMUSCULAR | Status: DC | PRN
  Administered 2012-02-10: 0.4 mg via INTRAVENOUS

## 2012-02-10 MED ORDER — LIDOCAINE HCL (CARDIAC) 20 MG/ML IV SOLN
INTRAVENOUS | Status: DC | PRN
  Administered 2012-02-10: 30 mg via INTRAVENOUS

## 2012-02-10 MED ORDER — ACETAMINOPHEN 10 MG/ML IV SOLN: 1000.0000 mg | Freq: Once | INTRAVENOUS | Status: DC | PRN

## 2012-02-10 MED ORDER — HYDROMORPHONE HCL PF 1 MG/ML IJ SOLN
0.2500 mg | INTRAMUSCULAR | Status: DC | PRN
  Administered 2012-02-10: 0.5 mg via INTRAVENOUS

## 2012-02-10 MED ORDER — LACTATED RINGERS IV SOLN
INTRAVENOUS | Status: DC
  Administered 2012-02-10 (×2): via INTRAVENOUS

## 2012-02-10 MED ORDER — MIDAZOLAM HCL 5 MG/5ML IJ SOLN
INTRAMUSCULAR | Status: DC | PRN
  Administered 2012-02-10: 2 mg via INTRAVENOUS

## 2012-02-10 MED ORDER — 0.9 % SODIUM CHLORIDE (POUR BTL) OPTIME
TOPICAL | Status: DC | PRN
  Administered 2012-02-10: 1000 mL

## 2012-02-10 MED ORDER — ONDANSETRON HCL 4 MG/2ML IJ SOLN
INTRAMUSCULAR | Status: DC | PRN
  Administered 2012-02-10: 4 mg via INTRAVENOUS

## 2012-02-10 MED ORDER — SUFENTANIL CITRATE 50 MCG/ML IV SOLN
INTRAVENOUS | Status: DC | PRN
  Administered 2012-02-10: 5 ug via INTRAVENOUS
  Administered 2012-02-10: 10 ug via INTRAVENOUS
  Administered 2012-02-10: 20 ug via INTRAVENOUS

## 2012-02-10 MED ORDER — NEBIVOLOL HCL 2.5 MG PO TABS
2.5000 mg | ORAL_TABLET | Freq: Once | ORAL | Status: AC
  Administered 2012-02-10: 2.5 mg via ORAL
  Filled 2012-02-10 (×3): qty 1

## 2012-02-10 SURGICAL SUPPLY — 29 items
BRUSH CYTOL CELLEBRITY 1.5X140 (MISCELLANEOUS) ×2 IMPLANT
CANISTER SUCTION 2500CC (MISCELLANEOUS) ×2 IMPLANT
CLOTH BEACON ORANGE TIMEOUT ST (SAFETY) ×2 IMPLANT
CONT SPEC 4OZ CLIKSEAL STRL BL (MISCELLANEOUS) ×2 IMPLANT
COTTONBALL LRG STERILE PKG (GAUZE/BANDAGES/DRESSINGS) IMPLANT
COVER TABLE BACK 60X90 (DRAPES) ×2 IMPLANT
FORCEPS BIOP RJ4 1.8 (CUTTING FORCEPS) ×2 IMPLANT
GLOVE SURG SIGNA 7.5 PF LTX (GLOVE) ×2 IMPLANT
GOWN PREVENTION PLUS XLARGE (GOWN DISPOSABLE) ×2 IMPLANT
KIT ROOM TURNOVER OR (KITS) ×2 IMPLANT
MARKER SKIN DUAL TIP RULER LAB (MISCELLANEOUS) ×2 IMPLANT
NEEDLE 22X1 1/2 (OR ONLY) (NEEDLE) IMPLANT
NEEDLE BIOPSY TRANSBRONCH 21G (NEEDLE) IMPLANT
NEEDLE BLUNT 18X1 FOR OR ONLY (NEEDLE) IMPLANT
NEEDLE SYS SONOTIP II EBUSTBNA (NEEDLE) ×2 IMPLANT
NS IRRIG 1000ML POUR BTL (IV SOLUTION) ×2 IMPLANT
OIL SILICONE PENTAX (PARTS (SERVICE/REPAIRS)) ×2 IMPLANT
PAD ARMBOARD 7.5X6 YLW CONV (MISCELLANEOUS) ×4 IMPLANT
SPONGE GAUZE 4X4 12PLY (GAUZE/BANDAGES/DRESSINGS) ×2 IMPLANT
SYR 20CC LL (SYRINGE) ×2 IMPLANT
SYR 20ML ECCENTRIC (SYRINGE) ×4 IMPLANT
SYR 5ML LUER SLIP (SYRINGE) ×2 IMPLANT
SYR CONTROL 10ML LL (SYRINGE) IMPLANT
TOWEL OR 17X24 6PK STRL BLUE (TOWEL DISPOSABLE) ×2 IMPLANT
TRAP SPECIMEN MUCOUS 40CC (MISCELLANEOUS) ×2 IMPLANT
TUBE CONNECTING 12X1/4 (SUCTIONS) ×2 IMPLANT
VALVE BIOPSY  SINGLE USE (MISCELLANEOUS)
VALVE BIOPSY SINGLE USE (MISCELLANEOUS) IMPLANT
VALVE SUCTION BRONCHIO DISP (MISCELLANEOUS) IMPLANT

## 2012-02-10 NOTE — Interval H&P Note (Signed)
History and Physical Interval Note:  02/10/2012 1:40 PM  [name]  has presented today for surgery, with the diagnosis of Right lung mass  The various methods of treatment have been discussed with the patient and family. After consideration of risks, benefits and other options for treatment, the patient has consented to  Procedure(s) (LRB) with comments: VIDEO BRONCHOSCOPY WITH ENDOBRONCHIAL ULTRASOUND (N/A) as a surgical intervention .  The patient's history has been reviewed, patient examined, no change in status, stable for surgery.  I have reviewed the patient's chart and labs.  Questions were answered to the patient's satisfaction.     HENDRICKSON,STEVEN C

## 2012-02-10 NOTE — Preoperative (Signed)
Beta Blockers   Reason not to administer Beta Blockers:Bystolic At [phone] 06/11/12

## 2012-02-10 NOTE — H&P (View-Only) (Signed)
PCP is [name], MD Referring Provider is Avva, Serafina Royals, MD  Chief Complaint  Patient presents with  . Lung Mass    new lung mass CT at [facility]    HPI: 61 year old [name] presents with chief complaint of lung mass.  Mrs. Urquiza is a 61 year old [name] well-known to me from previous care of both her and her husband. She recently saw Dr. Felipa Eth to be evaluated for weight loss. She says she has lost about 21 pounds over the past 6 weeks. 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 A CT of the chest was done at [facility] on 01/31/2012. It showed a 2.7 x 3.2 x 2.8 cm spiculated mass in the inferior medial aspect of the right upper lobe abutting the major fissure there also was a 1.5 cm anterior upper mediastinal nodule other lymph nodes or characterizes not enlarged. There also was extensive calcific atherosclerotic vascular disease..  She does get chest tightness with exertion which he says is reminiscent of her angina prior to her coronary bypass grafting by Dr. Andrey Campanile many years ago. Apparently she had a catheterization a year ago which showed no significant lesions. She had a 60-pack-year history of smoking prior to her coronary bypass grafting, but quit in 1995 after the procedure. She does complain of shortness of breath with moderate exertion. Her physical activities are markedly limited by chronic pain following back surgery. She does have frequent wheezing.   Past Medical History  Diagnosis Date  . Acute myocardial infarction, unspecified site, episode of care unspecified   . Esophageal dysmotility   . Unspecified hemorrhoids without mention of complication   . Hemorrhoids   . Esophageal stricture   . Diaphragmatic hernia without mention of obstruction or gangrene   . 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  Coronary atherosclerosis of unspecified type of vessel, native or graft   . Peripheral neuropathy   . Other and unspecified hyperlipidemia   . Peptic ulcer, unspecified site, unspecified as acute or chronic, without mention of hemorrhage, perforation, or obstruction   . Unspecified cardiovascular disease   . Depressive disorder, not elsewhere classified   . Anxiety state, unspecified   . COPD (chronic obstructive pulmonary disease)   . GERD (gastroesophageal reflux disease)   . Candida esophagitis     on multiple occasions  . C. difficile diarrhea   . PVD (peripheral vascular disease)   . Headache   . Weight loss 06/13/2011    scheduled here by family doctor    Past Surgical History  Procedure Date  . Tonsillectomy 1974  . Fracture lt jaw 1976  . Breast surgery 1969, 1970    bilateral  . Epigastric hernia repair 2008    Dr Dorris Fetch  . Coronary artery bypass graft 1995  . Carpal tunnel release   . Total abdominal hysterectomy 1998  . Laminectomy   . Coronary angioplasty     multiple  . Subclavian vein angioplasty / stenting     Family History  Problem Relation Age of Onset  . Lung cancer Father   . Heart disease Mother   . Heart disease Sister   . Heart disease Father   . Colon cancer Neg Hx   . Asthma Father   . Allergies Father     Social History History  Substance Use Topics  . Smoking  status: Former Smoker -- 1.5 packs/day for 30 years    Types: Cigarettes    Quit date: 05/24/2009  . Smokeless tobacco: Never Used  . Alcohol Use: No    Current Outpatient Prescriptions  Medication Sig Dispense Refill  . aspirin 81 MG EC tablet Take 81 mg by mouth daily.        Marland Kitchen atorvastatin (LIPITOR) 80 MG tablet Take 80 mg by mouth daily.      . Cholecalciferol (VITAMIN D3) 5000 UNITS TABS Take 1 tablet by mouth daily.        Marland Kitchen dextromethorphan-guaiFENesin (MUCINEX DM) 30-600 MG per 12 hr tablet Take 1 tablet by mouth every 12 (twelve) hours as needed.       .  ezetimibe (ZETIA) 10 MG tablet Take 10 mg by mouth daily.        . famotidine (PEPCID) 20 MG tablet One at bedtime  30 tablet  11  . fluticasone (FLONASE) 50 MCG/ACT nasal spray Place 2 sprays into the nose 2 (two) times daily.        Marland Kitchen HYDROcodone-acetaminophen (NORCO) 10-325 MG per tablet Take 1 tablet by mouth every 6 (six) hours as needed. 1-2 tablets every 4-6 hours as needed. For pain      . montelukast (SINGULAIR) 10 MG tablet Take 10 mg by mouth at bedtime.        . nebivolol (BYSTOLIC) 2.5 MG tablet Take 1 tablet (2.5 mg total) by mouth daily.  30 tablet  0  . nitroGLYCERIN (NITROSTAT) 0.4 MG SL tablet Place 0.4 mg under the tongue every 5 (five) minutes as needed. For chest pain      . omeprazole (PRILOSEC) 20 MG capsule Take 20 mg by mouth 2 (two) times daily.        . prasugrel (EFFIENT) 10 MG TABS Take by mouth daily.        . predniSONE (DELTASONE) 1 MG tablet Take 2 tablets by mouth once daily  60 tablet  1  . PROAIR HFA 108 (90 BASE) MCG/ACT inhaler INHALE 2 PUFFS INTO THE LUNGS EVERY 6 HOURS AS NEEDED FOR WHEEZING.  8.5 g  4  . Spacer/Aero-Holding Chambers (AEROCHAMBER PLUS) inhaler Use as instructed  1 each  0  . SYMBICORT 160-4.5 MCG/ACT inhaler INHALE 2 PUFFS INTO THE LUNGS 2 TIMES DAILY.  10.2 g  4  . tiotropium (SPIRIVA HANDIHALER) 18 MCG inhalation capsule Place 1 capsule (18 mcg total) into inhaler and inhale daily.  30 each  5    Allergies  Allergen Reactions  . Amiodarone     Drops HR too low  . Clopidogrel Bisulfate     REACTION: hives    Review of Systems  Constitutional: Positive for unexpected weight change (21 lb wt loss over 6 weeks). Negative for fever.  Respiratory: Positive for cough (nonproductive), chest tightness (pleuritic CP), shortness of breath and wheezing.   Cardiovascular: Positive for chest pain (similar to previous angina).       Claudication  Gastrointestinal:       Reflux, heartburn,constipation, abdominal pain  Musculoskeletal: Positive  for arthralgias and gait problem.  Psychiatric/Behavioral:       Anxiety  All other systems reviewed and are negative.    BP 105/62  Pulse 72  Temp 97.4 F (Whitney.3 C)  Resp 18  Ht 5\' 7"  (1.702 m)  Wt 100 lb (45.Whitney kg)  BMI 15.66 kg/m2  SpO2 97% Physical Exam  Vitals reviewed. Constitutional: She is oriented to person, place, and  time. No distress.       cachectic  HENT:  Head: Normocephalic and atraumatic.  Eyes: EOM are normal. Pupils are equal, round, and reactive to light.  Neck: Neck supple. No thyromegaly present.  Cardiovascular: Normal rate, regular rhythm and normal heart sounds.  Exam reveals no gallop and no friction rub.   No murmur heard. Pulmonary/Chest: She has no wheezes. She has no rales.  Abdominal: Soft. There is tenderness (mild).  Musculoskeletal: She exhibits no edema.  Lymphadenopathy:    She has no cervical adenopathy.  Neurological: She is alert and oriented to person, place, and time. No cranial nerve deficit.  Skin: Skin is warm and dry.     Diagnostic Tests: CT of chest 01/31/2012-findings as noted in history of present illness. I do think there is a little more fullness to the paratracheal nodes than was noted in the report.  Impression: 61 year old with history of tobacco abuse and COPD who presents with a 21 pound weight loss over 6 weeks, now weighing 98 pounds. 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 She has some mild hilar or mediastinal adenopathy, but not definitive stage III disease. She is very questionable in terms of being a candidate for surgical resection due to her extensive cardiac history, severe weight loss, and general deconditioning.  We need to proceed with a diagnostic and staging workup of the mass.  1. Pulmonary function testing with room air blood gas  2 PET/CT for radiographic staging  3. MR of the brain to  rule out brain metastases.  4. Bronchoscopy and endobronchial ultrasound to biopsy the mass and sample the mediastinal lymph nodes.  I discussed with the patient and her family the indications, risks, benefits, and alternatives for diagnosing this lesion. They understand the endoscopy and endobronchial ultrasound will be performed under general anesthesia via endotracheal tube an outpatient basis. They understand the risk include those related general anesthesia including but not limited to MI or death. They understand that there is a risk of DVT, PE, MI, bleeding, pneumothorax, and respiratory failure related to the procedure itself. She accepts these risks and wishes to proceed  I advised her to stop effient 5 days prior to the procedure. She informed me that she had stopped this on her own 3 weeks ago  Plan: PFTs and radiographic studies as noted above.  Bronchoscopy and endobronchial ultrasound on Thursday, September 19

## 2012-02-10 NOTE — Anesthesia Preprocedure Evaluation (Addendum)
Anesthesia Evaluation  Patient identified by MRN, date of birth, ID band Patient awake    Reviewed: Allergy & Precautions, H&P , NPO status , Patient's Chart, lab work & pertinent test results, reviewed documented beta blocker date and time   Airway Mallampati: I TM Distance: >3 FB Neck ROM: Full    Dental  (+) Teeth Intact and Dental Advisory Given   Pulmonary asthma , COPD COPD inhaler,  breath sounds clear to auscultation        Cardiovascular hypertension, Pt. on medications and Pt. on home beta blockers + CAD, + Past MI and +CHF Rhythm:Regular Rate:Normal     Neuro/Psych    GI/Hepatic PUD, GERD-  Medicated and Controlled,  Endo/Other    Renal/GU      Musculoskeletal   Abdominal   Peds  Hematology   Anesthesia Other Findings   Reproductive/Obstetrics                          Anesthesia Physical Anesthesia Plan  ASA: III  Anesthesia Plan: General   Post-op Pain Management:    Induction: Intravenous  Airway Management Planned: Oral ETT  Additional Equipment:   Intra-op Plan:   Post-operative Plan:   Informed Consent: I have reviewed the patients History and Physical, chart, labs and discussed the procedure including the risks, benefits and alternatives for the proposed anesthesia with the patient or authorized representative who has indicated his/her understanding and acceptance.   Dental advisory given  Plan Discussed with: CRNA, Anesthesiologist and Surgeon  Anesthesia Plan Comments:         Anesthesia Quick Evaluation

## 2012-02-10 NOTE — Brief Op Note (Addendum)
02/10/2012  3:39 PM  PATIENT:  Rhodia Albright  61 y.o. female  PRE-OPERATIVE DIAGNOSIS:  Right lung mass  POST-OPERATIVE DIAGNOSIS:  Right lung mass  PROCEDURE:  Procedure(s) (LRB) with comments: VIDEO BRONCHOSCOPY WITH ENDOBRONCHIAL ULTRASOUND (N/A) Bronchoscopy with brushings, biopsies and washings  SURGEON:  Surgeon(s) and Role:    * Loreli Slot, MD - Primary  ANESTHESIA:   general  EBL:  Total I/O In: 1150 [I.V.:1150] Out: -   BLOOD ADMINISTERED:none  DRAINS: none   LOCAL MEDICATIONS USED:  NONE  SPECIMEN:  Source of Specimen:  RUL mass, Level 7, 4R and 4L nodes  DISPOSITION OF SPECIMEN:  PATHOLOGY  PLAN OF CARE: Discharge to home after PACU  PATIENT DISPOSITION:  PACU - hemodynamically stable.   Delay start of Pharmacological VTE agent (>24hrs) due to surgical blood loss or risk of bleeding: not applicable  Dictation # [id number]

## 2012-02-10 NOTE — Anesthesia Postprocedure Evaluation (Signed)
  Anesthesia Post-op Note  Patient: [name]  Procedure(s) Performed: Procedure(s) (LRB) with comments: VIDEO BRONCHOSCOPY WITH ENDOBRONCHIAL ULTRASOUND (N/A)  Patient Location: PACU  Anesthesia Type: General  Level of Consciousness: awake  Airway and Oxygen Therapy: Patient Spontanous Breathing  Post-op Pain: mild  Post-op Assessment: Post-op Vital signs reviewed  Post-op Vital Signs: Reviewed  Complications: No apparent anesthesia complications

## 2012-02-10 NOTE — Transfer of Care (Signed)
Immediate Anesthesia Transfer of Care Note  Patient: [name]  Procedure(s) Performed: Procedure(s) (LRB) with comments: VIDEO BRONCHOSCOPY WITH ENDOBRONCHIAL ULTRASOUND (N/A)  Patient Location: PACU  Anesthesia Type: General  Level of Consciousness: awake, alert  and oriented  Airway & Oxygen Therapy: Patient Spontanous Breathing and Patient connected to nasal cannula oxygen  Post-op Assessment: Report given to PACU RN, Post -op Vital signs reviewed and stable and Patient moving all extremities  Post vital signs: Reviewed and stable  Complications: No apparent anesthesia complications

## 2012-02-11 NOTE — Op Note (Signed)
NAMEJAYLIANNA, [name]               ACCOUNT NO.:  [id number]  MEDICAL RECORD NO.:  [id number]  LOCATION:  [facility]                         FACILITY:  [facility]  PHYSICIAN:  Salvatore Decent. Dorris Fetch, M.D.DATE OF BIRTH:  [birth year]  DATE OF PROCEDURE:  02/10/2012 DATE OF DISCHARGE:  02/10/2012                              OPERATIVE REPORT   PREOPERATIVE DIAGNOSES:  Right upper lobe mass and mediastinal adenopathy.  POSTOPERATIVE DIAGNOSES:  Right upper lobe mass and mediastinal adenopathy.  PROCEDURE:  Video bronchoscopy with brushings, biopsies and washings, endobronchial ultrasound with aspiration of level 7, 4R and 4L lymph nodes.  SURGEON:  Salvatore Decent. Dorris Fetch, M.D.  ASSISTANT:  None.  ANESTHESIA:  General.  FINDINGS:  Tumor at origin of right upper lobe bronchus with linear nodularity extending into the mainstem bronchus along its lateral aspect.  The quick prep of brushings revealed likely carcinoma. Level 7, 4R and 4L lymph nodes, all mildly enlarged. Aspirations performed, and no definite tumor seen on quick prep.  CLINICAL NOTE:  [name] is a 61 year old woman with a history of tobacco abuse, who recently has had a cough and significant weight loss. Workup revealed a right hilar mass.  CT showed a spiculated right upper lobe mass and mild mediastinal and hilar adenopathy.  The patient was advised to undergo bronchoscopy and endobronchial ultrasound for diagnosis and staging. The indications, risks, benefits, and alternatives were discussed in detail with the patient.  She understood and accepted the risks and agreed to proceed.  OPERATIVE NOTE:  [name] was brought to the preoperative holding area on February 10, 2012. Anesthesia established intravenous access.  She was taken to the operating room, anesthetized, and intubated.  Flexible fiberoptic bronchoscopy was carried out via the endotracheal tube.  On the left, there was normal endobronchial anatomy and no  endobronchial lesions.  At the right upper lobe orifice, there was a mass along the mucosa extending from the mass into the right mainstem bronchus where small white nodules in a linear pattern along the lateral aspect of the mainstem bronchus.  There appeared to be some extrinsic compression of the middle lobe bronchus, but no endobronchial lesion there. And there was normal endobronchial anatomy in the lower lobe branches.  Biopsies were then taken of the right mainstem bronchus.  There was bleeding from the biopsies and saline with epinephrine was used to irrigate, which helped to control the bleeding.  This was sent as a separate specimen.  Next, biopsies were taken of the mass lesion at the origin of the right upper lobe bronchus, multiple biopsies were taken and then brushings were performed.  One set of which were sent for quick prep and the remainder of which will be sent for permanent cytology. And finally, additional biopsies were again performed and again saline with epinephrine was used to help control bleeding.  Next, the endobronchial ultrasound scope was placed and advanced to the carina.  The level 7 node was identified and aspirated.  With ultrasound visualization clearly showing the needle within the node, multiple passes were obtained.  Next, a 4R lymph node was identified.  This also appeared to be moderatelyenlarged.  Again, it was aspirated with visualization of  the needle to ensure this stayed within the node. These specimens were sent for quick prep.  The level 4L lymph node was identified and aspirated, and finally additional aspirations were taken from the level 7 node.  The quick prep returned with a probable carcinoma on the brushings from the right upper lobe orifice.  Lymphoid cells were seen in the 4R node.  Additional specimen was requested from the 7 node, which had already been obtained and will be sent for permanent cytology.  There was no ongoing or  active bleeding.  The scope was removed.  The patient was extubated in the operating room and taken to the Postanesthetic Care Unit in good condition.     Salvatore Decent Dorris Fetch, M.D.     SCH/MEDQ  D:  02/10/2012  T:  02/11/2012  Job:  [id number]

## 2012-02-15 ENCOUNTER — Telehealth: Payer: Self-pay | Admitting: Thoracic Surgery (Cardiothoracic Vascular Surgery)

## 2012-02-15 ENCOUNTER — Other Ambulatory Visit (HOSPITAL_COMMUNITY): Payer: Medicaid Other

## 2012-02-15 ENCOUNTER — Encounter (HOSPITAL_COMMUNITY)
Admission: RE | Admit: 2012-02-15 | Discharge: 2012-02-15 | Disposition: A | Discharge status: Home / Self Care | Payer: Medicaid Other | Source: Ambulatory Visit | Attending: Thoracic Surgery (Cardiothoracic Vascular Surgery) | Admitting: Thoracic Surgery (Cardiothoracic Vascular Surgery)

## 2012-02-15 DIAGNOSIS — I2510 Atherosclerotic heart disease of native coronary artery without angina pectoris: Secondary | ICD-10-CM | POA: Insufficient documentation

## 2012-02-15 DIAGNOSIS — I700 Atherosclerosis of aorta: Secondary | ICD-10-CM | POA: Insufficient documentation

## 2012-02-15 DIAGNOSIS — C771 Secondary and unspecified malignant neoplasm of intrathoracic lymph nodes: Secondary | ICD-10-CM | POA: Insufficient documentation

## 2012-02-15 DIAGNOSIS — R918 Other nonspecific abnormal finding of lung field: Secondary | ICD-10-CM

## 2012-02-15 DIAGNOSIS — R222 Localized swelling, mass and lump, trunk: Secondary | ICD-10-CM | POA: Insufficient documentation

## 2012-02-15 DIAGNOSIS — Z951 Presence of aortocoronary bypass graft: Secondary | ICD-10-CM | POA: Insufficient documentation

## 2012-02-15 MED ORDER — FLUDEOXYGLUCOSE F - 18 (FDG) INJECTION
17.9000 | Freq: Once | INTRAVENOUS | Status: AC | PRN
  Administered 2012-02-15: 17.9 via INTRAVENOUS

## 2012-02-15 NOTE — Telephone Encounter (Signed)
Informed patient of biopsy and PET results The biopsies showed squamous cell cancer The lymph node aspirations were negative, but the PET does show hypermetabolic activity in the nodes, so I suspect we are dealing with Stage III disease.\ She will see Oncology and Rad Onc at [facility] this week

## 2012-02-17 ENCOUNTER — Other Ambulatory Visit: Payer: Self-pay | Admitting: Radiation Oncology

## 2012-02-17 ENCOUNTER — Ambulatory Visit (HOSPITAL_BASED_OUTPATIENT_CLINIC_OR_DEPARTMENT_OTHER): Payer: Medicaid Other | Admitting: Internal Medicine

## 2012-02-17 ENCOUNTER — Encounter: Payer: Self-pay | Admitting: Internal Medicine

## 2012-02-17 ENCOUNTER — Encounter: Payer: Self-pay | Admitting: *Deleted

## 2012-02-17 ENCOUNTER — Ambulatory Visit
Admission: RE | Admit: 2012-02-17 | Discharge: 2012-02-17 | Disposition: A | Discharge status: Home / Self Care | Payer: Medicaid Other | Source: Ambulatory Visit | Attending: Radiation Oncology | Admitting: Radiation Oncology

## 2012-02-17 ENCOUNTER — Other Ambulatory Visit: Payer: Self-pay | Admitting: *Deleted

## 2012-02-17 ENCOUNTER — Encounter: Payer: Self-pay | Admitting: Radiation Oncology

## 2012-02-17 VITALS — BP 114/68 | HR 65 | Temp 98.1°F | Resp 19 | Ht 67.0 in | Wt 100.0 lb

## 2012-02-17 DIAGNOSIS — D491 Neoplasm of unspecified behavior of respiratory system: Secondary | ICD-10-CM

## 2012-02-17 DIAGNOSIS — C3490 Malignant neoplasm of unspecified part of unspecified bronchus or lung: Secondary | ICD-10-CM

## 2012-02-17 DIAGNOSIS — C3410 Malignant neoplasm of upper lobe, unspecified bronchus or lung: Secondary | ICD-10-CM

## 2012-02-17 DIAGNOSIS — R918 Other nonspecific abnormal finding of lung field: Secondary | ICD-10-CM

## 2012-02-17 DIAGNOSIS — IMO0002 Reserved for concepts with insufficient information to code with codable children: Secondary | ICD-10-CM | POA: Insufficient documentation

## 2012-02-17 NOTE — Progress Notes (Signed)
Spoke with pt and family at [facility] today.  Educational/resource information given and explained.  Distress screening completed.  Questions and concerns addressed

## 2012-02-17 NOTE — Addendum Note (Signed)
Encounter addended by: Oneita Hurt, MD on: 02/17/2012  5:14 PM<BR>     Documentation filed: Patient Instructions Section

## 2012-02-17 NOTE — Progress Notes (Signed)
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 female.  She saw Dr. Felipa Eth to be evaluated for weight loss. She says she has lost about 21 pounds over the past 6 weeks. 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 A CT of the chest was done at [facility] on 01/31/2012. It showed a 2.7 x 3.2 x 2.8 cm spiculated mass in the inferior medial aspect of the right upper lobe abutting the major fissure there also was a 1.5 cm anterior upper mediastinal nodule other lymph nodes or characterizes not enlarged. On 9/19, Dr. Dorris Fetch performed Video bronchoscopy with brushings, biopsies and washings, endobronchial ultrasound with aspiration of level 7, 4R and 4L lymph  Nodes.  Pathology showed squamous cell cancer  The lymph node aspirations were negative, but the PET does show hypermetabolic activity in the nodes.  Her case and films were presented in our multidisciplinary lung conference and the consensus recommendation was to proceed with concurrent chemoradiotherapy.  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 difficile diarrhea; PVD (peripheral vascular disease); Headache; Weight loss (06/13/2011); CHF (congestive heart failure) (2011); Asthma; and Hypertension.    PAST SURGICAL HISTORY: Past Surgical History  Procedure Date  . Tonsillectomy 1974  . Fracture lt jaw 1976  . Breast surgery 1969, 1970    bilateral  . Epigastric hernia repair 2008    Dr Dorris Fetch  . Coronary artery bypass graft 1995  . Carpal tunnel release   . Total abdominal hysterectomy 1998  . Laminectomy   . Coronary angioplasty     multiple  . Subclavian vein angioplasty / stenting     FAMILY HISTORY: family history includes Allergies in her father; Asthma in her father; Heart disease in her father, mother, and sister; and Lung cancer in her father.  There is no history of Colon cancer.  SOCIAL HISTORY:  reports that she quit smoking about 2 years ago. Her smoking use included Cigarettes. She has a 45 pack-year smoking history. She has never used smokeless tobacco. She reports that she does not drink alcohol or use illicit drugs.  ALLERGIES: Amiodarone and Clopidogrel bisulfate  MEDICATIONS:  Current Outpatient Prescriptions  Medication Sig Dispense Refill  . albuterol (PROVENTIL HFA;VENTOLIN HFA) 108 (90 BASE) MCG/ACT inhaler Inhale 2 puffs into the lungs every 6 (six) hours as needed. For wheezing      . aspirin 81 MG EC tablet Take  81 mg by mouth daily.        Marland Kitchen atorvastatin (LIPITOR) 80 MG tablet Take 80 mg by mouth daily.      . budesonide-formoterol  (SYMBICORT) 160-4.5 MCG/ACT inhaler Inhale 2 puffs into the lungs 2 (two) times daily.      . Cholecalciferol (VITAMIN D-3) 5000 UNITS TABS Take 1 tablet by mouth daily.      Marland Kitchen dextromethorphan-guaiFENesin (MUCINEX DM) 30-600 MG per 12 hr tablet Take 1 tablet by mouth every 12 (twelve) hours as needed. For cough      . ezetimibe (ZETIA) 10 MG tablet Take 10 mg by mouth daily.        . famotidine (PEPCID) 20 MG tablet Take 20 mg by mouth at bedtime.      . fluticasone (FLONASE) 50 MCG/ACT nasal spray Place 2 sprays into the nose 2 (two) times daily.        Marland Kitchen HYDROcodone-acetaminophen (NORCO) 10-325 MG per tablet Take 1-2 tablets by mouth every 4 (four) hours as needed. For pain      . montelukast (SINGULAIR) 10 MG tablet Take 10 mg by mouth at bedtime.        . nebivolol (BYSTOLIC) 2.5 MG tablet Take 1 tablet (2.5 mg total) by mouth daily.  30 tablet  0  . nitroGLYCERIN (NITROSTAT) 0.4 MG SL tablet Place 0.4 mg under the tongue every 5 (five) minutes as needed. For chest pain      . omeprazole (PRILOSEC) 20 MG capsule Take 20 mg by mouth 2 (two) times daily.        . prasugrel (EFFIENT) 10 MG TABS Take 10 mg by mouth daily.       Marland Kitchen tiotropium (SPIRIVA) 18 MCG inhalation capsule Place 18 mcg into inhaler and inhale daily.        REVIEW OF SYSTEMS:  A 15 point review of systems is documented in the electronic medical record. This was obtained by the nursing staff. However, I reviewed this with the patient to discuss relevant findings and make appropriate changes.  A comprehensive review of systems was negative.   PHYSICAL EXAM:  height is 5\' 7"  (1.702 m) and weight is 100 lb (45.36 kg). Her oral temperature is 98.1 F (36.7 C). Her blood pressure is 114/68 and her pulse is 65. Her respiration is 19 and oxygen saturation is 98%.   Respiratory effort is unlabored.  LABORATORY DATA:  Lab Results  Component Value Date   WBC 9.0 02/07/2012   HGB 14.7 02/07/2012   HCT 42.3 02/07/2012   MCV 95.3  02/07/2012   PLT 252 02/07/2012   Lab Results  Component Value Date   NA 139 02/07/2012   K 4.2 02/07/2012   CL 101 02/07/2012   CO2 29 02/07/2012   Lab Results  Component Value Date   ALT 13 02/07/2012   AST 18 02/07/2012   ALKPHOS 106 02/07/2012   BILITOT 0.5 02/07/2012     RADIOGRAPHY: Dg Chest 2 View Within Previous 72 Hours.  Films Obtained On Friday Are Acceptable For Monday And Tuesday Cases  02/07/2012  *RADIOLOGY REPORT*  Clinical Data: Preop radiograph  CHEST - 2 VIEW  Comparison: 09/03/2011  Findings: Previous median sternotomy CABG procedure.  Heart size is normal.  No pleural effusion or edema.  There is a left upper lobe perihilar mass is identified measuring approximately 2.8 cm.  The lungs are hyperinflated and there is slightly coarsened interstitial markings noted.  IMPRESSION:  1.  No acute cardiopulmonary  abnormalities. 2.  Right upper lobe perihilar mass.   Original Report Authenticated By: Rosealee Albee, M.D.    Mr Laqueta Jean Wo Contrast  02/08/2012  *RADIOLOGY REPORT*  Clinical Data: New diagnosis of lung mass.  Staging.  MRI HEAD WITHOUT AND WITH CONTRAST  Technique:  Multiplanar, multiecho pulse sequences of the brain and surrounding structures were obtained according to standard protocol without and with intravenous contrast  Contrast: 9mL MULTIHANCE GADOBENATE DIMEGLUMINE 529 MG/ML IV SOLN  Comparison: CT head 03/27/2006  Findings: Ventricle size is normal.  Negative for acute infarct. Scattered small nonenhancing white matter hyperintensities are most likely chronic infarcts.  Brainstem and cerebellum are normal.  No intracranial hemorrhage.  Postcontrast imaging reveals normal enhancement.  No evidence of metastatic disease.  No mass or edema.  Negative for shift to the midline structures.  No skull lesions are identified.  Pituitary is normal in size. Mild mastoid sinus effusions bilaterally.  IMPRESSION: Negative for metastatic disease.  No acute abnormality.   Original  Report Authenticated By: Camelia Phenes, M.D.    Nm Pet Image Initial (pi) Skull Base To Thigh  02/15/2012  *RADIOLOGY REPORT*  Clinical Data: Initial treatment strategy for new lung mass.  NUCLEAR MEDICINE PET SKULL BASE TO THIGH  Fasting Blood Glucose:  103  Technique:  17.9 mCi F-18 FDG was injected intravenously. CT data was obtained and used for attenuation correction and anatomic localization only.  (This was not acquired as a diagnostic CT examination.) Additional exam technical data entered on technologist worksheet.  Comparison:  Chest radiographs dated 02/07/2012  Findings:  Neck: No hypermetabolic lymph nodes in the neck.  Chest:  2.9 x 3.7 cm spiculated posterior right upper lobe mass (series 2/image 85), max SUV 11.8, compatible with primary bronchogenic neoplasm.  1.3 cm short axis right prevascular node (series 2/image 73), max SUV 6.6.  1.2 cm short-axis precarinal node (series 2/image 86), max SUV 6.1.  Coronary atherosclerosis.  Postsurgical changes related to prior CABG.  Abdomen/Pelvis:  No abnormal hypermetabolic activity within the liver, pancreas, adrenal glands, or spleen.  No hypermetabolic lymph nodes in the abdomen or pelvis.  Atherosclerotic calcifications of the abdominal aorta and branch vessels.  Skeleton:  No focal hypermetabolic activity to suggest skeletal metastasis.  Prior thoracolumbar fixation.  IMPRESSION: 2.9 x 3.7 cm spiculated posterior right upper lobe mass, max SUV 11.8, compatible with primary bronchogenic neoplasm.  Prevascular and precarinal nodal metastases, as described above, max SUV 6.1.  No evidence of metastatic disease in the neck, abdomen, or pelvis.   Original Report Authenticated By: Charline Bills, M.D.       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  She appears to be a good candidate for Concurrent Chemo and radiation with curative intent.  PLAN: Today, reviewed the findings and  workup thus far with the patient to cover the natural history of non-small cell lung cancer talked about potential treatment options including surgical and nonsurgical options. We focused on chemoradiotherapy. We discussed the acute and long-term effects of this treatment. I shared patient education information with her. I filled out a patient counseling form for her with relevant treatment diagrams retained a copy for our records. Patient would like to proceed and will return for CT-based treatment planning tomorrow to potentially begin combined chemoradiotherapy on Monday, October 7 I spent 60 minutes minutes face to face with the patient and more than 50%  of that time was spent in counseling and/or coordination of care.   ------------------------------------------------  Artist Pais. Kathrynn Running, M.D.

## 2012-02-17 NOTE — Patient Instructions (Signed)
   Department of Radiation Oncology  Phone:  [phone] Fax:        [phone]   What to Expect on Simulation Day  "Simulation" is the planning day. The goal for this day is to get everything set up to start your radiation treatments.  You will be getting a CT scan.  Here is a picture of the machine so we can better prepare you for what to expect during your visit: .   You may have had previous scans but this scan is different.  The doctor will use this scan to plan your treatments.   You will be with us for approximately 1 hour.  Upon arrival, register and get a pager; once the pager goes off, take the elevator to the ground level.  Someone will meet you and walk you to the CT simulator.  You will need to change into a gown.  You may also need to remove any jewelry.    We will be asking your name and birthday to verify your identity and ensure your safety.  We apologize in advance that the table that you will be laying on for your CT scan will be a hard surface.  This table surface is necessary to ensure the best treatment possible.    You will be lying on the table in the position that you will be treated in daily. This includes using different items such as molds or arm holders that will keep you in the same position every day.   It is very important while you are on the table to try to relax and hold as still as possible, during and after the CT scan.  Some scans require contrast.  Contrast is a tool that we use to highlight different areas that the doctor may want to see better on the scan.  We will let you know if you will need contrast before your simulation appointment.    After your scan while you are still on the table, the doctor will determine where to place tattoos on your skin. Tattoos are permanent marks that we use to ensure that you are in the right position for your treatment. We place the tattoos by using tattoo ink and a needle to make a small stick just  underneath your skin. They are very small and look like freckles.  You will only receive these on simulation day, not every day.   We will be taking photographs of the tattoos and your position on the table for documentation. These photos are in your chart and are only used by your radiation oncology team.    We will give you your daily schedule for your radiation treatments along with an explanation of what to expect on treatment day.   Thank you for allowing us to be a part of your care!  Please let us know if you have any questions! [facility]:  [phone]  

## 2012-02-17 NOTE — Progress Notes (Signed)
[facility] [phone]   [phone]  CONSULT NOTE  REASON FOR CONSULTATION:  61 years old white female diagnosed with lung cancer.  HPI [name] is a 61 y.o. female was past medical history significant for COPD, dyslipidemia, depression, anxiety, peripheral neuropathy, GERD, history of coronary artery disease status post CABG in 1996, congestive heart failure, and history of fairly smoking but quit 6 months ago. The patient mentions that over the last 18 months she has been complaining of increasing weight loss started in January of 2012 after her back surgery. She was recently evaluated by her primary care physician and chest x-ray was performed which showed right upper lobe lung mass. This was followed by CT scan of the chest at that imaging on 01/30/2014 and it showed right upper lobe lung mass measuring 5.2 CM with anterior upper mediastinal adenopathy. The patient was referred to Dr. Dorris Fetch and on 02/08/2012 she had an MRI of the brain which showed no evidence for metastatic brain lesions. On 02/08/2012 she underwent video bronchoscopy with brushings, biopsies and washings as well as endobronchial ultrasound with aspiration of level 7, 4R and 4 a lymph nodes. The bronchial biopsy of the right upper lobe were consistent with squamous cell carcinoma. Fine needle aspiration of the lymph nodes were negative for malignancy. A PET scan was performed on 02/15/2012, and it showed 2.9 x 3.7 CM spiculated posterior right upper lobe mass with maximum SUV of 11.8 compatible with primary bronchogenic neoplasm. There was prevascular and precarinal node and metastasis with SUV max of 6.1. There was no evidence of metastatic disease in the neck, abdomen or pelvis.  Dr. Dorris Fetch kindly referred the patient to me today for further evaluation and recommendation regarding treatment of her condition. The patient continues to complain of increasing fatigue, weight  loss, back pain, chest pain, cough, shortness breath. She has one episode of hemoptysis after the biopsy. She also complains of headache and constipation but no visual changes.   Her family history significant for mother who had brain cancer, father had lung cancer, brother had lung and throat cancer, a niece with lung cancer, son with thyroid cancer. Her husband is also diagnosed with lung cancer and currently under my care with stable disease. The patient is married and has 2 children. 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 She has a history of smoking 2 pack per day for around 42 years but quit 6 months ago, no history of alcohol or drug abuse.  @SFHPI @  Past Medical History  Diagnosis Date  . Acute myocardial infarction, unspecified site, episode of care unspecified   . Esophageal dysmotility   . Unspecified hemorrhoids without mention of complication   . Hemorrhoids   . Esophageal stricture   . Diaphragmatic hernia without mention of obstruction or gangrene   . 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 Coronary atherosclerosis of unspecified type of vessel, native or graft   . Peripheral neuropathy   . Other and unspecified hyperlipidemia   . Peptic ulcer, unspecified site, unspecified as acute or chronic, without mention of hemorrhage, perforation, or obstruction   . Unspecified cardiovascular disease   . Depressive disorder, not elsewhere classified   . Anxiety state, unspecified   . COPD (chronic obstructive pulmonary disease)   . GERD (gastroesophageal reflux disease)   . Candida esophagitis     on multiple occasions  . C.  difficile diarrhea   . PVD (peripheral vascular disease)   . Headache   . Weight loss 06/13/2011    scheduled here by family doctor  . CHF (congestive heart failure) 2011  . Asthma   . Hypertension     Past Surgical History  Procedure Date  . Tonsillectomy 1974  . Fracture lt jaw  1976  . Breast surgery 1969, 1970    bilateral  . Epigastric hernia repair 2008    Dr Dorris Fetch  . Coronary artery bypass graft 1995  . Carpal tunnel release   . Total abdominal hysterectomy 1998  . Laminectomy   . Coronary angioplasty     multiple  . Subclavian vein angioplasty / stenting     Family History  Problem Relation Age of Onset  . Lung cancer Father   . Heart disease Father   . Asthma Father   . Allergies Father   . Heart disease Mother   . Heart disease Sister   . Colon cancer Neg Hx     Social History History  Substance Use Topics  . Smoking status: Former Smoker -- 1.5 packs/day for 30 years    Types: Cigarettes    Quit date: 05/24/2009  . Smokeless tobacco: Never Used  . Alcohol Use: No    Allergies  Allergen Reactions  . Amiodarone     Drops HR too low  . Clopidogrel Bisulfate     REACTION: hives    Current Outpatient Prescriptions  Medication Sig Dispense Refill  . albuterol (PROVENTIL HFA;VENTOLIN HFA) 108 (90 BASE) MCG/ACT inhaler Inhale 2 puffs into the lungs every 6 (six) hours as needed. For wheezing      . aspirin 81 MG EC tablet Take 81 mg by mouth daily.        Marland Kitchen atorvastatin (LIPITOR) 80 MG tablet Take 80 mg by mouth daily.      . budesonide-formoterol (SYMBICORT) 160-4.5 MCG/ACT inhaler Inhale 2 puffs into the lungs 2 (two) times daily.      . Cholecalciferol (VITAMIN D-3) 5000 UNITS TABS Take 1 tablet by mouth daily.      Marland Kitchen dextromethorphan-guaiFENesin (MUCINEX DM) 30-600 MG per 12 hr tablet Take 1 tablet by mouth every 12 (twelve) hours as needed. For cough      . ezetimibe (ZETIA) 10 MG tablet Take 10 mg by mouth daily.        . famotidine (PEPCID) 20 MG tablet Take 20 mg by mouth at bedtime.      . fluticasone (FLONASE) 50 MCG/ACT nasal spray Place 2 sprays into the nose 2 (two) times daily.        Marland Kitchen HYDROcodone-acetaminophen (NORCO) 10-325 MG per tablet Take 1-2 tablets by mouth every 4 (four) hours as needed. For pain      .  montelukast (SINGULAIR) 10 MG tablet Take 10 mg by mouth at bedtime.        . nebivolol (BYSTOLIC) 2.5 MG tablet Take 1 tablet (2.5 mg total) by mouth daily.  30 tablet  0  . nitroGLYCERIN (NITROSTAT) 0.4 MG SL tablet Place 0.4 mg under the tongue every 5 (five) minutes as needed. For chest pain      . omeprazole (PRILOSEC) 20 MG capsule Take 20 mg by mouth 2 (two) times daily.        . prasugrel (EFFIENT) 10 MG TABS Take 10 mg by mouth daily.       Marland Kitchen tiotropium (SPIRIVA) 18 MCG inhalation capsule Place 18 mcg into inhaler and inhale  daily.        Review of Systems  A comprehensive review of systems was negative except for: Constitutional: positive for anorexia, fatigue and weight loss Respiratory: positive for cough, dyspnea on exertion, pleurisy/chest pain and wheezing Gastrointestinal: positive for constipation Musculoskeletal: positive for arthralgias, back pain and bone pain  Physical Exam  WGN:FAOZH, healthy, no distress, cooperative and malnourished SKIN: skin color, texture, turgor are normal HEAD: Normocephalic, No masses, lesions, tenderness or abnormalities EYES: normal EARS: External ears normal OROPHARYNX:no exudate and no erythema  NECK: supple, no adenopathy LYMPH:  no palpable lymphadenopathy, no hepatosplenomegaly BREAST:not examined LUNGS: clear to auscultation , and palpation HEART: regular rate & rhythm, no murmurs and no gallops ABDOMEN:abdomen soft, non-tender, normal bowel sounds and no masses or organomegaly BACK: Back symmetric, no curvature. EXTREMITIES:no joint deformities, effusion, or inflammation, no edema, no skin discoloration, no clubbing, no cyanosis  NEURO: alert & oriented x 3 with fluent speech, no focal motor/sensory deficits  PERFORMANCE STATUS: ECOG 1  LABORATORY DATA: Lab Results  Component Value Date   WBC 9.0 02/07/2012   HGB 14.7 02/07/2012   HCT 42.3 02/07/2012   MCV 95.3 02/07/2012   PLT 252 02/07/2012      Chemistry        Component Value Date/Time   NA 139 02/07/2012 1227   K 4.2 02/07/2012 1227   CL 101 02/07/2012 1227   CO2 29 02/07/2012 1227   BUN 9 02/07/2012 1227   CREATININE 0.50 02/07/2012 1227      Component Value Date/Time   CALCIUM 10.4 02/07/2012 1227   ALKPHOS 106 02/07/2012 1227   AST 18 02/07/2012 1227   ALT 13 02/07/2012 1227   BILITOT 0.5 02/07/2012 1227       RADIOGRAPHIC STUDIES: Dg Chest 2 View Within Previous 72 Hours.  Films Obtained On Friday Are Acceptable For Monday And Tuesday Cases  02/07/2012  *RADIOLOGY REPORT*  Clinical Data: Preop radiograph  CHEST - 2 VIEW  Comparison: 09/03/2011  Findings: Previous median sternotomy CABG procedure.  Heart size is normal.  No pleural effusion or edema.  There is a left upper lobe perihilar mass is identified measuring approximately 2.8 cm.  The lungs are hyperinflated and there is slightly coarsened interstitial markings noted.  IMPRESSION:  1.  No acute cardiopulmonary abnormalities. 2.  Right upper lobe perihilar mass.   Original Report Authenticated By: Rosealee Albee, M.D.    Mr Laqueta Jean Wo Contrast  02/08/2012  *RADIOLOGY REPORT*  Clinical Data: New diagnosis of lung mass.  Staging.  MRI HEAD WITHOUT AND WITH CONTRAST  Technique:  Multiplanar, multiecho pulse sequences of the brain and surrounding structures were obtained according to standard protocol without and with intravenous contrast  Contrast: 9mL MULTIHANCE GADOBENATE DIMEGLUMINE 529 MG/ML IV SOLN  Comparison: CT head 03/27/2006  Findings: Ventricle size is normal.  Negative for acute infarct. Scattered small nonenhancing white matter hyperintensities are most likely chronic infarcts.  Brainstem and cerebellum are normal.  No intracranial hemorrhage.  Postcontrast imaging reveals normal enhancement.  No evidence of metastatic disease.  No mass or edema.  Negative for shift to the midline structures.  No skull lesions are identified.  Pituitary is normal in size. Mild mastoid sinus effusions  bilaterally.  IMPRESSION: Negative for metastatic disease.  No acute abnormality.   Original Report Authenticated By: Camelia Phenes, M.D.    Nm Pet Image Initial (pi) Skull Base To Thigh  02/15/2012  *RADIOLOGY REPORT*  Clinical Data: Initial treatment strategy  for new lung mass.  NUCLEAR MEDICINE PET SKULL BASE TO THIGH  Fasting Blood Glucose:  103  Technique:  17.9 mCi F-18 FDG was injected intravenously. CT data was obtained and used for attenuation correction and anatomic localization only.  (This was not acquired as a diagnostic CT examination.) Additional exam technical data entered on technologist worksheet.  Comparison:  Chest radiographs dated 02/07/2012  Findings:  Neck: No hypermetabolic lymph nodes in the neck.  Chest:  2.9 x 3.7 cm spiculated posterior right upper lobe mass (series 2/image 85), max SUV 11.8, compatible with primary bronchogenic neoplasm.  1.3 cm short axis right prevascular node (series 2/image 73), max SUV 6.6.  1.2 cm short-axis precarinal node (series 2/image 86), max SUV 6.1.  Coronary atherosclerosis.  Postsurgical changes related to prior CABG.  Abdomen/Pelvis:  No abnormal hypermetabolic activity within the liver, pancreas, adrenal glands, or spleen.  No hypermetabolic lymph nodes in the abdomen or pelvis.  Atherosclerotic calcifications of the abdominal aorta and branch vessels.  Skeleton:  No focal hypermetabolic activity to suggest skeletal metastasis.  Prior thoracolumbar fixation.  IMPRESSION: 2.9 x 3.7 cm spiculated posterior right upper lobe mass, max SUV 11.8, compatible with primary bronchogenic neoplasm.  Prevascular and precarinal nodal metastases, as described above, max SUV 6.1.  No evidence of metastatic disease in the neck, abdomen, or pelvis.   Original Report Authenticated By: Charline Bills, M.D.     ASSESSMENT: This is a very pleasant 61 years old white female recently diagnosed with a stage IIIa (T2a, N2, MX) non-small cell lung cancer, squamous cell  carcinoma based on the imaging studies also of the fine needle aspiration of the mediastinal lymph nodes were negative but this could be also a sampling error.  PLAN: I have a lengthy discussion with the patient and her family today about her disease stage, prognosis and treatment options. I recommended for the patient a course of concurrent chemoradiation with weekly carboplatin for AUC of 2 and paclitaxel 45 mg/M2 with radiation for a total of 6-7 weeks depending on the final dose of radiation. I discussed with the patient adverse effect of the chemotherapy including but not limited to alopecia, myelosuppression, nausea vomiting, peripheral neuropathy, liver or renal dysfunction. The patient had chemotherapy education class next week. She is expected to start the first cycle of her systemic chemotherapy on 02/28/2012. The patient will be seen later today by Dr. Kathrynn Running for evaluation and discussion of the radiotherapy option. I will call her pharmacy with a prescription for Compazine 10 mg by mouth every 6 hours as needed for nausea. The patient would come back for followup visit in 2 weeks for evaluation and management any adverse effect of her chemotherapy. She was advised to call me immediately if she has any concerning symptoms in the interval. I gave the patient and her family the time to ask questions and I answered them completely to their satisfaction. The patient agreed to proceed with treatment as planned. All questions were answered. The patient knows to call the clinic with any problems, questions or concerns. We can certainly see the patient much sooner if necessary.  Thank you so much for allowing me to participate in the care of [name]. I will continue to follow up the patient with you and assist in her care.  I spent 30 minutes counseling the patient face to face. The total time spent in the appointment was 60 minutes.  CODE STATUS: Full code.  Whitney Sando  K. 02/17/2012, 3:07 PM

## 2012-02-17 NOTE — Progress Notes (Signed)
Clinical Social Work met with patient, patient's family, Systems developer, and thoracic navigator at [facility] today. Dr. Arbutus Ped reviewed patient's diagnosis and treatment plan.  Mrs. Pastorino's spouse and niece have a history of lung cancer and were also treated by Dr. Arbutus Ped.  CSW informed patient of support services available at [facility] and encouraged patient or family to call with any questions or concerns. Mrs. Soberanes reports she has recently lost weight and is interested in meeting with the dietitian.  She reports she drinks three cans of ensure plus per day.    Kathrin Penner, MSW, LCSW Clinical Social Worker [facility] [phone]

## 2012-02-17 NOTE — Assessment & Plan Note (Signed)
Concurrent Chemo and radiation planned

## 2012-02-18 ENCOUNTER — Encounter: Payer: Self-pay | Admitting: Radiation Oncology

## 2012-02-18 ENCOUNTER — Telehealth: Payer: Self-pay | Admitting: Oncology

## 2012-02-18 ENCOUNTER — Telehealth: Payer: Self-pay | Admitting: Internal Medicine

## 2012-02-18 ENCOUNTER — Ambulatory Visit
Admission: RE | Admit: 2012-02-18 | Discharge: 2012-02-18 | Disposition: A | Discharge status: Home / Self Care | Payer: Medicaid Other | Source: Ambulatory Visit | Attending: Radiation Oncology | Admitting: Radiation Oncology

## 2012-02-18 VITALS — BP 129/49 | HR 60 | Temp 97.3°F | Resp 18 | Ht 67.0 in | Wt 100.9 lb

## 2012-02-18 DIAGNOSIS — Z510 Encounter for antineoplastic radiation therapy: Secondary | ICD-10-CM | POA: Insufficient documentation

## 2012-02-18 DIAGNOSIS — K5900 Constipation, unspecified: Secondary | ICD-10-CM | POA: Insufficient documentation

## 2012-02-18 DIAGNOSIS — C3410 Malignant neoplasm of upper lobe, unspecified bronchus or lung: Secondary | ICD-10-CM

## 2012-02-18 DIAGNOSIS — R5381 Other malaise: Secondary | ICD-10-CM | POA: Insufficient documentation

## 2012-02-18 DIAGNOSIS — R51 Headache: Secondary | ICD-10-CM | POA: Insufficient documentation

## 2012-02-18 DIAGNOSIS — Z79899 Other long term (current) drug therapy: Secondary | ICD-10-CM | POA: Insufficient documentation

## 2012-02-18 DIAGNOSIS — C3490 Malignant neoplasm of unspecified part of unspecified bronchus or lung: Secondary | ICD-10-CM | POA: Insufficient documentation

## 2012-02-18 DIAGNOSIS — R042 Hemoptysis: Secondary | ICD-10-CM | POA: Insufficient documentation

## 2012-02-18 DIAGNOSIS — B370 Candidal stomatitis: Secondary | ICD-10-CM | POA: Insufficient documentation

## 2012-02-18 NOTE — Telephone Encounter (Signed)
s.w. pt and advised of oct appts....pt stated that she has already been in chemo class with husband...she is aware of chemo edu class scheduled....sed

## 2012-02-18 NOTE — Progress Notes (Signed)
Met with patient to discuss RO billing. Pt had an application for [facility] [place] for gas assistance that will be sent today, as well as, ACS.  Dx: 162.3 Upper lobe, lung  Attending Rad: Dr. Landis Gandy Tx: [phone] Extrl Beam

## 2012-02-18 NOTE — Telephone Encounter (Signed)
Per staff message and POF I have scheduled appts.  JMW  

## 2012-02-18 NOTE — Progress Notes (Signed)
Received patient in the clinic today unaccompanied for nurse evaluation prior to simulation following MTOC. Patient is alert and oriented to person, place, and time. No distress noted. Steady gait noted. Pleasant affect noted. Patient reports chronic back pain 7 on a scale of 0-10 related to multiple back surgeries. Vitals WDL. Medications reviewed and updated. Escorted patient to CT/SIM.

## 2012-02-18 NOTE — Progress Notes (Signed)
  Radiation Oncology         [phone] ________________________________  Name: [name] MRN: [id number]  Date: 02/18/2012  DOB: 1950/09/15  SIMULATION AND TREATMENT PLANNING NOTE  DIAGNOSIS:  62 yo woman with T2a N2 M0 Squamous Cell Carcinoma of the Right Lung - Stage IIIB  NARRATIVE:  The patient was brought to the CT Simulation planning suite.  Identity was confirmed.  All relevant records and images related to the planned course of therapy were reviewed.  The patient freely provided informed written consent to proceed with treatment after reviewing the details related to the planned course of therapy. The consent form was witnessed and verified by the simulation staff.  Then, the patient was set-up in a stable reproducible  supine position for radiation therapy.  CT images were obtained.  Surface markings were placed.  The CT images were loaded into the planning software.  Then the target and avoidance structures were contoured.  Treatment planning then occurred.  The radiation prescription was entered and confirmed.  A total of 5 complex treatment devices were fabricated in the form of MLC collimators custom shaped to cover the target volume of the tumor and involved lymph nodes.. I have requested : This treatment constitutes a Special Treatment Procedure for the following reason: [ Concurrent chemotherapy requiring careful monitoring for increased toxicities of treatment including weekly laboratory values..  I have ordered:Nutrition Consult  3D planning has been requested for the purpose of gathering [facility] information on target, heart, cord, lungs, esophagus, and major vessels  PLAN:  The patient will receive 66 Gy in 33 fractions .  ________________________________  [name], M.D.

## 2012-02-19 MED ORDER — PROCHLORPERAZINE MALEATE 10 MG PO TABS: 10.0000 mg | ORAL_TABLET | Freq: Four times a day (QID) | ORAL | Status: DC | PRN

## 2012-02-19 NOTE — Patient Instructions (Signed)
You have a diagnosis of stage IIIa non-small cell lung cancer We discussed treatment options including concurrent chemotherapy and radiation. First dose expected on 02/28/2012. Followup in 2 weeks

## 2012-02-21 ENCOUNTER — Telehealth: Payer: Self-pay | Admitting: Pulmonary Disease

## 2012-02-21 ENCOUNTER — Encounter: Payer: Self-pay | Admitting: Medical Oncology

## 2012-02-21 MED ORDER — NYSTATIN 100000 UNIT/ML MT SUSP: 500000.0000 [IU] | Freq: Two times a day (BID) | OROMUCOSAL | Status: DC

## 2012-02-21 NOTE — Telephone Encounter (Signed)
Called and spoke with patient, informed patient of Dr. Vassie Loll recs as listed below.  Medication called into verified pharmacy.  Patient verbalized understanding and nothing further needed at this time.

## 2012-02-21 NOTE — Telephone Encounter (Signed)
Called, spoke with pt.  Reports she has white patches all in her mouth, back of throat, and on tongue.  States she is rinsing mouth out well, using a baking soda based toothpaste, and using a spacer with inhalers.  States she usually takes a pill for 5 days which helps.  As Dr. Craige Cotta is listed off, will send to doc of the day.  Dr. Vassie Loll, pls advise.  Thank you.  Note:  Pt would like msg passed on to Dr. Craige Cotta --- her family dr has dx her with lung ca.  States she has a 3 cm area and is in lymph nodes.  Scheduled to start chemo and radiation on Monday.    [facility]  Allergies Verified: Allergies  Allergen Reactions  . Amiodarone     Drops HR too low  . Clopidogrel Bisulfate     REACTION: hives

## 2012-02-21 NOTE — Telephone Encounter (Signed)
Nystatin 10k 5ml swish  Swallow bid x 7ds

## 2012-02-22 ENCOUNTER — Ambulatory Visit: Admission: RE | Admit: 2012-02-22 | Payer: Medicaid Other | Source: Ambulatory Visit | Admitting: Radiation Oncology

## 2012-02-22 ENCOUNTER — Telehealth: Payer: Self-pay | Admitting: *Deleted

## 2012-02-22 NOTE — Telephone Encounter (Signed)
Per staff message I have scheduled nutrition consult. PAtietn called .  JMW

## 2012-02-24 ENCOUNTER — Other Ambulatory Visit: Payer: Medicaid Other

## 2012-02-25 ENCOUNTER — Encounter: Payer: Self-pay | Admitting: Radiation Oncology

## 2012-02-25 ENCOUNTER — Telehealth: Payer: Self-pay | Admitting: Internal Medicine

## 2012-02-25 ENCOUNTER — Ambulatory Visit
Admission: RE | Admit: 2012-02-25 | Discharge: 2012-02-25 | Disposition: A | Discharge status: Home / Self Care | Payer: Medicaid Other | Source: Ambulatory Visit | Attending: Radiation Oncology | Admitting: Radiation Oncology

## 2012-02-25 DIAGNOSIS — C3410 Malignant neoplasm of upper lobe, unspecified bronchus or lung: Secondary | ICD-10-CM

## 2012-02-25 NOTE — Progress Notes (Signed)
  Radiation Oncology         [phone] ________________________________  Name: [name] MRN: [id number]  Date: 02/25/2012  DOB: 17-Apr-1951  Simulation Verification Note  Status: outpatient  NARRATIVE: The patient was brought to the treatment unit and placed in the planned treatment position. The clinical setup was verified. Then port films were obtained and uploaded to the radiation oncology medical record software.  The treatment beams were carefully compared against the planned radiation fields. The position location and shape of the radiation fields was reviewed. They targeted volume of tissue appears to be appropriately covered by the radiation beams. Organs at risk appear to be excluded as planned.  Based on my personal review, I approved the simulation verification. The patient's treatment will proceed as planned.  ------------------------------------------------  [name], M.D.

## 2012-02-25 NOTE — Telephone Encounter (Signed)
Gave pt appt for October, November 2013 lab, chemo and ML

## 2012-02-27 NOTE — Progress Notes (Signed)
  Radiation Oncology         [phone] ________________________________  Name: [name] MRN: [id number]  Date: 02/25/2012  DOB: 06/01/1950  Simulation Verification Note  Status: outpatient  NARRATIVE: The patient was brought to the treatment unit and placed in the planned treatment position. The clinical setup was verified. Then port films were obtained and uploaded to the radiation oncology medical record software.  The treatment beams were carefully compared against the planned radiation fields. The position location and shape of the radiation fields was reviewed. They targeted volume of tissue appears to be appropriately covered by the radiation beams. Organs at risk appear to be excluded as planned.  Based on my personal review, I approved the simulation verification. The patient's treatment will proceed as planned.  ------------------------------------------------  Leilany Digeronimo A. Nimesh Riolo, M.D.    

## 2012-02-28 ENCOUNTER — Ambulatory Visit
Admission: RE | Admit: 2012-02-28 | Discharge: 2012-02-28 | Disposition: A | Discharge status: Home / Self Care | Payer: Medicaid Other | Source: Ambulatory Visit | Attending: Radiation Oncology | Admitting: Radiation Oncology

## 2012-02-28 ENCOUNTER — Ambulatory Visit: Payer: Medicaid Other | Admitting: Nutrition

## 2012-02-28 ENCOUNTER — Encounter: Payer: Self-pay | Admitting: Nutrition

## 2012-02-28 ENCOUNTER — Ambulatory Visit (HOSPITAL_BASED_OUTPATIENT_CLINIC_OR_DEPARTMENT_OTHER): Payer: Medicaid Other

## 2012-02-28 ENCOUNTER — Other Ambulatory Visit (HOSPITAL_BASED_OUTPATIENT_CLINIC_OR_DEPARTMENT_OTHER): Payer: Medicaid Other | Admitting: Lab

## 2012-02-28 ENCOUNTER — Telehealth: Payer: Self-pay | Admitting: *Deleted

## 2012-02-28 VITALS — BP 132/80 | HR 59 | Temp 98.0°F | Resp 16

## 2012-02-28 DIAGNOSIS — C3490 Malignant neoplasm of unspecified part of unspecified bronchus or lung: Secondary | ICD-10-CM

## 2012-02-28 DIAGNOSIS — Z5111 Encounter for antineoplastic chemotherapy: Secondary | ICD-10-CM

## 2012-02-28 DIAGNOSIS — C3410 Malignant neoplasm of upper lobe, unspecified bronchus or lung: Secondary | ICD-10-CM

## 2012-02-28 LAB — CBC WITH DIFFERENTIAL/PLATELET
Basophils Absolute: 0 10*3/uL (ref 0.0–0.1)
EOS%: 1.6 % (ref 0.0–7.0)
HGB: 14.1 g/dL (ref 11.6–15.9)
LYMPH%: 28.6 % (ref 14.0–49.7)
MCH: 32.9 pg (ref 25.1–34.0)
MCV: 95.1 fL (ref 79.5–101.0)
MONO%: 7.5 % (ref 0.0–14.0)
NEUT%: 61.9 % (ref 38.4–76.8)
Platelets: 248 10*3/uL (ref 145–400)
RDW: 12.5 % (ref 11.2–14.5)

## 2012-02-28 LAB — COMPREHENSIVE METABOLIC PANEL (CC13)
Alkaline Phosphatase: 114 U/L (ref 40–150)
BUN: 13 mg/dL (ref 7.0–26.0)
Creatinine: 0.7 mg/dL (ref 0.6–1.1)
Glucose: 83 mg/dl (ref 70–99)
Sodium: 137 mEq/L (ref 136–145)
Total Bilirubin: 0.4 mg/dL (ref 0.20–1.20)

## 2012-02-28 MED ORDER — PACLITAXEL CHEMO INJECTION 300 MG/50ML
45.0000 mg/m2 | Freq: Once | INTRAVENOUS | Status: AC
  Administered 2012-02-28: 66 mg via INTRAVENOUS
  Filled 2012-02-28: qty 11

## 2012-02-28 MED ORDER — SODIUM CHLORIDE 0.9 % IV SOLN
223.6000 mg | Freq: Once | INTRAVENOUS | Status: AC
  Administered 2012-02-28: 220 mg via INTRAVENOUS
  Filled 2012-02-28: qty 22

## 2012-02-28 MED ORDER — SODIUM CHLORIDE 0.9 % IV SOLN
Freq: Once | INTRAVENOUS | Status: AC
  Administered 2012-02-28: 11:00:00 via INTRAVENOUS

## 2012-02-28 MED ORDER — DIPHENHYDRAMINE HCL 50 MG/ML IJ SOLN
50.0000 mg | Freq: Once | INTRAMUSCULAR | Status: AC
  Administered 2012-02-28: 50 mg via INTRAVENOUS

## 2012-02-28 MED ORDER — ONDANSETRON 16 MG/50ML IVPB (CHCC)
16.0000 mg | Freq: Once | INTRAVENOUS | Status: AC
Start: 2012-02-28 — End: 2012-02-28
  Administered 2012-02-28: 16 mg via INTRAVENOUS

## 2012-02-28 MED ORDER — FAMOTIDINE IN NACL 20-0.9 MG/50ML-% IV SOLN
20.0000 mg | Freq: Once | INTRAVENOUS | Status: AC
  Administered 2012-02-28: 20 mg via INTRAVENOUS

## 2012-02-28 MED ORDER — DEXAMETHASONE SODIUM PHOSPHATE 4 MG/ML IJ SOLN
20.0000 mg | Freq: Once | INTRAMUSCULAR | Status: AC
  Administered 2012-02-28: 20 mg via INTRAVENOUS

## 2012-02-28 NOTE — Progress Notes (Signed)
This is a 61 year old female patient of Dr. Rebecca Eaton diagnosed with lung cancer.  Past medical history includes: Esophageal stricture, hyperlipidemia, asthma, hypertension, peptic ulcer disease, depression, anxiety, COPD, GERD, C. difficile, PVD, CHF.  Medications include: Lipitor, vitamin D3, Pepcid.  Labs were reviewed.  Height 67 inches Weight 100.9 pounds on September 27. Usual body weight 108 pounds 12/16/2010. BMI: 15.8.  Patient is quite drowsy today in chemotherapy. She continues to attempt oral intake as tolerated. She is drinking Ensure Plus 4 times a day. She has no new side effects. She verbalizes the desire to continue to receive assistance in receiving Ensure Plus. I have previously supplied one case of Ensure Plus to patient. She was very Adult nurse.  Nutrition diagnosis: Unintended weight loss related to diagnosis of lung cancer and associated treatments as evidenced by 7% weight loss from usual body weight.  Intervention: I educated [name] to continue higher calorie, higher protein foods throughout the day. Patient was encouraged to continue Ensure Plus 4 times a day as needed to promote weight maintenance. I will continue to offer assistance to patient as needed. Patient has my contact information for questions or concerns.  Monitoring, evaluation, goals: Patient will tolerate oral intake to promote weight maintenance throughout treatment.    Next visit: I will followup with patient as needed and assist as necessary to promote weight maintenance.

## 2012-02-28 NOTE — Patient Instructions (Addendum)
[facility] Discharge Instructions for Patients Receiving Chemotherapy  Today you received the following chemotherapy agents Taxol and Carboplatin.  To help prevent nausea and vomiting after your treatment, we encourage you to take your nausea medication as prescribed.   If you develop nausea and vomiting that is not controlled by your nausea medication, call the clinic. If it is after clinic hours your family physician or the after hours number for the clinic or go to the Emergency Department.   BELOW ARE SYMPTOMS THAT SHOULD BE REPORTED IMMEDIATELY:  *FEVER GREATER THAN 100.5 F  *CHILLS WITH OR WITHOUT FEVER  NAUSEA AND VOMITING THAT IS NOT CONTROLLED WITH YOUR NAUSEA MEDICATION  *UNUSUAL SHORTNESS OF BREATH  *UNUSUAL BRUISING OR BLEEDING  TENDERNESS IN MOUTH AND THROAT WITH OR WITHOUT PRESENCE OF ULCERS  *URINARY PROBLEMS  *BOWEL PROBLEMS  UNUSUAL RASH Items with * indicate a potential emergency and should be followed up as soon as possible.  One of the nurses will contact you 24 hours after your treatment. Please let the nurse know about any problems that you may have experienced. Feel free to call the clinic you have any questions or concerns. The clinic phone number is [phone].   I have been informed and understand all the instructions given to me. I know to contact the clinic, my physician, or go to the Emergency Department if any problems should occur. I do not have any questions at this time, but understand that I may call the clinic during office hours   should I have any questions or need assistance in obtaining follow up care.    __________________________________________  _____________  __________ Signature of Patient or Authorized Representative            Date                   Time    __________________________________________ Nurse's Signature    

## 2012-02-29 ENCOUNTER — Ambulatory Visit
Admission: RE | Admit: 2012-02-29 | Discharge: 2012-02-29 | Disposition: A | Discharge status: Home / Self Care | Payer: Medicaid Other | Source: Ambulatory Visit | Attending: Radiation Oncology | Admitting: Radiation Oncology

## 2012-02-29 ENCOUNTER — Telehealth: Payer: Self-pay | Admitting: *Deleted

## 2012-02-29 ENCOUNTER — Encounter: Payer: Self-pay | Admitting: Medical Oncology

## 2012-02-29 ENCOUNTER — Ambulatory Visit: Payer: Medicaid Other

## 2012-02-29 VITALS — Wt 103.9 lb

## 2012-02-29 DIAGNOSIS — C3410 Malignant neoplasm of upper lobe, unspecified bronchus or lung: Secondary | ICD-10-CM

## 2012-02-29 MED ORDER — RADIAPLEXRX EX GEL
Freq: Once | CUTANEOUS | Status: AC
  Administered 2012-02-29: 12:00:00 via TOPICAL

## 2012-02-29 NOTE — Telephone Encounter (Signed)
Message left requesting return call for chemotherapy f/u.  Noted message today from patient about pain by collaborative nurse.

## 2012-02-29 NOTE — Progress Notes (Signed)
Post sim ed completed w/pt; gave pt "Radiation and You" booklet w/all pertinent information marked and discussed- re: skin irritation, difficulty swallowing, painful swallowing, fatigue, weight loss, pain. Gave pt Radiaplex lotion w/instructions for proper use. Pt seeing nutritionist, is drinking 4 cans Ensure daily. Pt given this RN's business card w/contact info.

## 2012-02-29 NOTE — Progress Notes (Signed)
Was approached by scheduling to see pt because she is having pain " all over , aches. " She received  Chemo yesterday . I  Called pt and told her the aches and pain is from taxol and she can take tylenol

## 2012-02-29 NOTE — Addendum Note (Signed)
Encounter addended by: Glennie Hawk, RN on: 02/29/2012  3:23 PM<BR>     Documentation filed: Inpatient MAR, Orders

## 2012-02-29 NOTE — Telephone Encounter (Signed)
Message copied by Augusto Garbe on Tue Feb 29, 2012  3:25 PM ------      Message from: Jolaine Artist      Created: Mon Feb 28, 2012  2:40 PM      Regarding: chemo follow up call       1st time Taxol/Carbo (husband also receiving chemo)

## 2012-03-01 ENCOUNTER — Encounter: Payer: Self-pay | Admitting: Medical Oncology

## 2012-03-01 ENCOUNTER — Ambulatory Visit
Admission: RE | Admit: 2012-03-01 | Discharge: 2012-03-01 | Disposition: A | Discharge status: Home / Self Care | Payer: Medicaid Other | Source: Ambulatory Visit | Attending: Radiation Oncology | Admitting: Radiation Oncology

## 2012-03-01 ENCOUNTER — Encounter: Payer: Self-pay | Admitting: Radiation Oncology

## 2012-03-01 VITALS — BP 110/56 | HR 75 | Temp 98.6°F | Resp 20

## 2012-03-01 DIAGNOSIS — C3410 Malignant neoplasm of upper lobe, unspecified bronchus or lung: Secondary | ICD-10-CM

## 2012-03-01 DIAGNOSIS — R197 Diarrhea, unspecified: Secondary | ICD-10-CM

## 2012-03-01 DIAGNOSIS — E861 Hypovolemia: Secondary | ICD-10-CM

## 2012-03-01 MED ORDER — DEXTROSE-NACL 5-0.9 % IV SOLN
Freq: Once | INTRAVENOUS | Status: AC
  Administered 2012-03-01: 13:00:00 via INTRAVENOUS
  Filled 2012-03-01: qty 1000

## 2012-03-01 MED ORDER — HYDROCODONE-ACETAMINOPHEN 5-300 MG PO TABS: 1.0000 | ORAL_TABLET | ORAL | Status: DC

## 2012-03-01 NOTE — Progress Notes (Signed)
Attempted 2 IV sticks@24  g 1 in needle, right forearm, good blood retuurns but when flushing with normal saline, veins blown, patient very dehydrated, IVF's D5ns ordered By Dr.Manning, asked Alphia Moh to try, her attempt x1 also failed, RFA 1 attempt, vein blown also after good blood return, called med onc, spoke with Charge nurse RN Willeen Niece, she sated she would come down in a few minutes, room 7 in nursing told to her, offerred sprite for patient and another warm blanket 12:18 PM

## 2012-03-01 NOTE — Progress Notes (Signed)
Diane Ignacia Felling charge Rn started patients  IV, LFA.#24 g, D5NS started infusing without dificulty,turned lights ddown, offerred 2nd sprite 1:03 PM

## 2012-03-01 NOTE — Progress Notes (Signed)
IV in LFA d/c after completion of IVF infusion, 1L. Pt tol well. She states she no longer has aches, pain in her body, denies nausea. Advised pt to begin taking Imodium if diarrhea persists. Pt verbalized understanding, agreement. Pt d/c home in wheelchair accompanied by son and husband.

## 2012-03-01 NOTE — Progress Notes (Signed)
Pt in nursing requesting to be seen by dr today. She c/o headache, all over achyness, sore throat, diarrhea since Monday when she received chemotherapy. Pt states she was told by nurse upstairs yesterday to take Tylenol, but pt has not taken. She has taken Ibuprofen 400 mg x 2 yesterday, and 8:30 am today and reports no relief. She has not taken Imodium for diarrhea. Pt denies nausea, vomiting. Pt has received 3/33 radiation tx to right lung.

## 2012-03-01 NOTE — Progress Notes (Signed)
  Radiation Oncology         [phone] ________________________________  Name: [name] MRN: [id number]  Date: 03/01/2012  DOB: 1950-06-11  Weekly Radiation Therapy Management  Current Dose: 6 Gy     Planned Dose:  66 Gy  Narrative . . . . . . . . The patient presents for routine under treatment assessment.                                                     The patient is without complaint.                                 Set-up films were reviewed.                                 The chart was checked. Physical Findings. . .  oral temperature is 98.6 F (37 C). Her blood pressure is 110/66 and her pulse is 105. Her respiration is 20. . Weight essentially stable.  No significant changes. Impression . . . . . . . The patient is  tolerating radiation. Plan . . . . . . . . . . . . Continue treatment as planned.  Given Hydrocodone/APAP and IV fluids  ________________________________  Artist Pais. Kathrynn Running, M.D.

## 2012-03-02 ENCOUNTER — Ambulatory Visit
Admission: RE | Admit: 2012-03-02 | Discharge: 2012-03-02 | Disposition: A | Discharge status: Home / Self Care | Payer: Medicaid Other | Source: Ambulatory Visit | Attending: Radiation Oncology | Admitting: Radiation Oncology

## 2012-03-03 ENCOUNTER — Telehealth: Payer: Self-pay | Admitting: Internal Medicine

## 2012-03-03 ENCOUNTER — Encounter: Payer: Self-pay | Admitting: Radiation Oncology

## 2012-03-03 ENCOUNTER — Ambulatory Visit (HOSPITAL_BASED_OUTPATIENT_CLINIC_OR_DEPARTMENT_OTHER): Payer: Medicaid Other | Admitting: Physician Assistant

## 2012-03-03 ENCOUNTER — Ambulatory Visit
Admission: RE | Admit: 2012-03-03 | Discharge: 2012-03-03 | Disposition: A | Discharge status: Home / Self Care | Payer: Medicaid Other | Source: Ambulatory Visit | Attending: Radiation Oncology | Admitting: Radiation Oncology

## 2012-03-03 ENCOUNTER — Encounter: Payer: Self-pay | Admitting: Physician Assistant

## 2012-03-03 VITALS — BP 105/62 | HR 90 | Temp 97.8°F | Resp 20 | Ht 67.0 in | Wt 101.8 lb

## 2012-03-03 VITALS — BP 108/53 | HR 89 | Temp 99.3°F | Resp 20 | Wt 102.4 lb

## 2012-03-03 DIAGNOSIS — C3410 Malignant neoplasm of upper lobe, unspecified bronchus or lung: Secondary | ICD-10-CM

## 2012-03-03 DIAGNOSIS — B370 Candidal stomatitis: Secondary | ICD-10-CM

## 2012-03-03 MED ORDER — FLUCONAZOLE 100 MG PO TABS: 100.0000 mg | ORAL_TABLET | Freq: Every day | ORAL | Status: DC

## 2012-03-03 MED ORDER — OXYCODONE HCL 5 MG PO TABS: 5.0000 mg | ORAL_TABLET | ORAL | Status: DC | PRN

## 2012-03-03 NOTE — Patient Instructions (Addendum)
Continue your course of concurrent chemotherapy/radiation therapy Follow up with Dr. Arbutus Ped in 2 weeks

## 2012-03-03 NOTE — Telephone Encounter (Signed)
md appt added to sch and a note was placed for pt to get a new sch at her 10/14 appt    aom

## 2012-03-03 NOTE — Progress Notes (Signed)
Pt states she continues to have generalized "all over aches and pains, 8/10". She takes Hydrocodone 10-325 mg and states "it used to give me relief but not anymore". Pt last took 2 tabs 6:30 am today. Pt did not fill Hydrocodone 5-300 mg script given to her earlier this week, stating "it's not as strong as what I am taking". She states she has pain in her back and legs due to 2 previous back surgeries. Talked w/pt about long and short-acting pain meds; she will discuss w/dr today. Pt fatigued, denies loss of appetite. She has prod cough w/thick white phlegm.

## 2012-03-03 NOTE — Progress Notes (Signed)
  Radiation Oncology         [phone] ________________________________  Name: [name] MRN: [id number]  Date: 03/03/2012  DOB: [birth year]  Weekly Radiation Therapy Management  Current Dose: 10 Gy     Planned Dose:  66 Gy  Narrative . . . . . . . . The patient presents for routine under treatment assessment.Pt states she continues to have generalized "all over aches and pains, 8/10". She takes Hydrocodone 10-325 mg and states "it used to give me relief but not anymore". Pt last took 2 tabs 6:30 am today. Pt did not fill Hydrocodone 5-300 mg script given to her earlier this week, stating "it's not as strong as what I am taking". She states she has pain in her back and legs due to 2 previous back surgeries. Talked w/pt about long and short-acting pain meds; she will discuss w/dr today.  Pt fatigued, denies loss of appetite. She has prod cough w/thick white phlegm   The patient is without complaint.                                 Set-up films were reviewed.                                 The chart was checked. Physical Findings. . .  weight is 102 lb 6.4 oz (46.448 kg). Her oral temperature is 99.3 F (37.4 C). Her blood pressure is 108/53 and her pulse is 89. Her respiration is 20 and oxygen saturation is 99%. . She has evident thrush.  Weight essentially stable.  No significant changes. Impression . . . . . . . The patient is  tolerating radiation. Plan . . . . . . . . . . . . Continue treatment as planned.  Start Diflucan and increase pain meds.  ________________________________  [name], M.D.

## 2012-03-06 ENCOUNTER — Ambulatory Visit (HOSPITAL_BASED_OUTPATIENT_CLINIC_OR_DEPARTMENT_OTHER): Payer: Medicaid Other

## 2012-03-06 ENCOUNTER — Ambulatory Visit
Admission: RE | Admit: 2012-03-06 | Discharge: 2012-03-06 | Disposition: A | Discharge status: Home / Self Care | Payer: Medicaid Other | Source: Ambulatory Visit | Attending: Radiation Oncology | Admitting: Radiation Oncology

## 2012-03-06 ENCOUNTER — Other Ambulatory Visit (HOSPITAL_BASED_OUTPATIENT_CLINIC_OR_DEPARTMENT_OTHER): Payer: Medicaid Other | Admitting: Lab

## 2012-03-06 VITALS — BP 107/60 | HR 67 | Temp 97.3°F | Resp 20

## 2012-03-06 DIAGNOSIS — Z5111 Encounter for antineoplastic chemotherapy: Secondary | ICD-10-CM

## 2012-03-06 DIAGNOSIS — C3490 Malignant neoplasm of unspecified part of unspecified bronchus or lung: Secondary | ICD-10-CM

## 2012-03-06 DIAGNOSIS — C3410 Malignant neoplasm of upper lobe, unspecified bronchus or lung: Secondary | ICD-10-CM

## 2012-03-06 LAB — CBC WITH DIFFERENTIAL/PLATELET
Basophils Absolute: 0 10*3/uL (ref 0.0–0.1)
Eosinophils Absolute: 0.1 10*3/uL (ref 0.0–0.5)
HCT: 35.4 % (ref 34.8–46.6)
HGB: 12.3 g/dL (ref 11.6–15.9)
LYMPH%: 16.9 % (ref 14.0–49.7)
MCV: 94.4 fL (ref 79.5–101.0)
MONO#: 0.6 10*3/uL (ref 0.1–0.9)
MONO%: 9.6 % (ref 0.0–14.0)
NEUT#: 4.8 10*3/uL (ref 1.5–6.5)
Platelets: 206 10*3/uL (ref 145–400)

## 2012-03-06 LAB — COMPREHENSIVE METABOLIC PANEL (CC13)
Albumin: 4.1 g/dL (ref 3.5–5.0)
Alkaline Phosphatase: 106 U/L (ref 40–150)
BUN: 11 mg/dL (ref 7.0–26.0)
Calcium: 9.8 mg/dL (ref 8.4–10.4)
Chloride: 104 mEq/L (ref 98–107)
Glucose: 86 mg/dl (ref 70–99)
Potassium: 4 mEq/L (ref 3.5–5.1)
Sodium: 137 mEq/L (ref 136–145)
Total Protein: 6.9 g/dL (ref 6.4–8.3)

## 2012-03-06 MED ORDER — SODIUM CHLORIDE 0.9 % IV SOLN: 223.6000 mg | Freq: Once | INTRAVENOUS | Status: DC

## 2012-03-06 MED ORDER — PACLITAXEL CHEMO INJECTION 300 MG/50ML
45.0000 mg/m2 | Freq: Once | INTRAVENOUS | Status: AC
  Administered 2012-03-06: 66 mg via INTRAVENOUS
  Filled 2012-03-06: qty 11

## 2012-03-06 MED ORDER — DIPHENHYDRAMINE HCL 50 MG/ML IJ SOLN
25.0000 mg | Freq: Once | INTRAMUSCULAR | Status: AC
  Administered 2012-03-06: 25 mg via INTRAVENOUS

## 2012-03-06 MED ORDER — FAMOTIDINE IN NACL 20-0.9 MG/50ML-% IV SOLN
20.0000 mg | Freq: Once | INTRAVENOUS | Status: AC
  Administered 2012-03-06: 20 mg via INTRAVENOUS

## 2012-03-06 MED ORDER — SODIUM CHLORIDE 0.9 % IV SOLN
180.0000 mg | Freq: Once | INTRAVENOUS | Status: AC
  Administered 2012-03-06: 180 mg via INTRAVENOUS
  Filled 2012-03-06: qty 18

## 2012-03-06 MED ORDER — ONDANSETRON 16 MG/50ML IVPB (CHCC)
16.0000 mg | Freq: Once | INTRAVENOUS | Status: AC
  Administered 2012-03-06: 16 mg via INTRAVENOUS

## 2012-03-06 MED ORDER — HEPARIN SOD (PORK) LOCK FLUSH 100 UNIT/ML IV SOLN
500.0000 [IU] | Freq: Once | INTRAVENOUS | Status: DC | PRN
  Filled 2012-03-06: qty 5

## 2012-03-06 MED ORDER — DEXAMETHASONE SODIUM PHOSPHATE 4 MG/ML IJ SOLN
20.0000 mg | Freq: Once | INTRAMUSCULAR | Status: AC
  Administered 2012-03-06: 20 mg via INTRAVENOUS

## 2012-03-06 MED ORDER — SODIUM CHLORIDE 0.9 % IJ SOLN
10.0000 mL | INTRAMUSCULAR | Status: DC | PRN
  Filled 2012-03-06: qty 10

## 2012-03-06 MED ORDER — SODIUM CHLORIDE 0.9 % IV SOLN
Freq: Once | INTRAVENOUS | Status: AC
  Administered 2012-03-06: 11:00:00 via INTRAVENOUS

## 2012-03-06 NOTE — Patient Instructions (Signed)
[facility] Discharge Instructions for Patients Receiving Chemotherapy  Today you received the following chemotherapy agents Taxol/Carboplatin To help prevent nausea and vomiting after your treatment, we encourage you to take your nausea medication as directed.   If you develop nausea and vomiting that is not controlled by your nausea medication, call the clinic. If it is after clinic hours your family physician or the after hours number for the clinic or go to the Emergency Department.   BELOW ARE SYMPTOMS THAT SHOULD BE REPORTED IMMEDIATELY:  *FEVER GREATER THAN 100.5 F  *CHILLS WITH OR WITHOUT FEVER  NAUSEA AND VOMITING THAT IS NOT CONTROLLED WITH YOUR NAUSEA MEDICATION  *UNUSUAL SHORTNESS OF BREATH  *UNUSUAL BRUISING OR BLEEDING  TENDERNESS IN MOUTH AND THROAT WITH OR WITHOUT PRESENCE OF ULCERS  *URINARY PROBLEMS  *BOWEL PROBLEMS  UNUSUAL RASH Items with * indicate a potential emergency and should be followed up as soon as possible.  One of the nurses will contact you 24 hours after your treatment. Please let the nurse know about any problems that you may have experienced. Feel free to call the clinic you have any questions or concerns. The clinic phone number is [phone].   I have been informed and understand all the instructions given to me. I know to contact the clinic, my physician, or go to the Emergency Department if any problems should occur. I do not have any questions at this time, but understand that I may call the clinic during office hours   should I have any questions or need assistance in obtaining follow up care.    __________________________________________  _____________  __________ Signature of Patient or Authorized Representative            Date                   Time    __________________________________________ Nurse's Signature    

## 2012-03-07 ENCOUNTER — Ambulatory Visit
Admission: RE | Admit: 2012-03-07 | Discharge: 2012-03-07 | Disposition: A | Discharge status: Home / Self Care | Payer: Medicaid Other | Source: Ambulatory Visit | Attending: Radiation Oncology | Admitting: Radiation Oncology

## 2012-03-07 ENCOUNTER — Telehealth: Payer: Self-pay | Admitting: Radiation Oncology

## 2012-03-07 NOTE — Telephone Encounter (Signed)
Pt was here to inq about gas assistance. I gave pt an EPP application to complete and bring back. Pt has also recvd fas assistance from [facility], as well as, [facility] referral.

## 2012-03-08 ENCOUNTER — Telehealth: Payer: Self-pay | Admitting: Medical Oncology

## 2012-03-08 ENCOUNTER — Ambulatory Visit
Admission: RE | Admit: 2012-03-08 | Discharge: 2012-03-08 | Disposition: A | Discharge status: Home / Self Care | Payer: Medicaid Other | Source: Ambulatory Visit | Attending: Radiation Oncology | Admitting: Radiation Oncology

## 2012-03-08 DIAGNOSIS — K1230 Oral mucositis (ulcerative), unspecified: Secondary | ICD-10-CM

## 2012-03-08 MED ORDER — MAGIC MOUTHWASH: 5.0000 mL | Freq: Four times a day (QID) | ORAL | Status: DC | PRN

## 2012-03-08 NOTE — Telephone Encounter (Signed)
Painful blisters inside mouth and on lower lip. Per Dr Arbutus Ped I called in magic mouthwash-pt notified

## 2012-03-08 NOTE — Telephone Encounter (Signed)
Pt stated she has rash where her IV was on Monday and she now has itching on her face and arms. I instructed her to take benadryl for the itching and told her to call back if symptoms worsen or she has  fever.100.5 f

## 2012-03-09 ENCOUNTER — Ambulatory Visit
Admission: RE | Admit: 2012-03-09 | Discharge: 2012-03-09 | Disposition: A | Discharge status: Home / Self Care | Payer: Medicaid Other | Source: Ambulatory Visit | Attending: Radiation Oncology | Admitting: Radiation Oncology

## 2012-03-09 ENCOUNTER — Encounter: Payer: Self-pay | Admitting: Medical Oncology

## 2012-03-09 ENCOUNTER — Other Ambulatory Visit: Payer: Self-pay | Admitting: Medical Oncology

## 2012-03-09 DIAGNOSIS — K121 Other forms of stomatitis: Secondary | ICD-10-CM

## 2012-03-09 MED ORDER — METHYLPREDNISOLONE 4 MG PO KIT: PACK | ORAL | Status: DC

## 2012-03-09 NOTE — Progress Notes (Unsigned)
Pt here with husband who is seeing Dr Arbutus Ped, for followup. I do not see any blisters on her lower lip  Or inside her mouth. Her tongue is red. She is taking magic mouthwash. She reports chills , afebrile . bp 94/63

## 2012-03-09 NOTE — Telephone Encounter (Signed)
Called medrol dose pak to pharmacy and pt notified

## 2012-03-10 ENCOUNTER — Ambulatory Visit
Admission: RE | Admit: 2012-03-10 | Discharge: 2012-03-10 | Disposition: A | Discharge status: Home / Self Care | Payer: Medicaid Other | Source: Ambulatory Visit | Attending: Radiation Oncology | Admitting: Radiation Oncology

## 2012-03-10 ENCOUNTER — Encounter: Payer: Self-pay | Admitting: Radiation Oncology

## 2012-03-10 VITALS — BP 115/61 | HR 77 | Temp 98.6°F | Resp 20 | Wt 103.6 lb

## 2012-03-10 DIAGNOSIS — C3410 Malignant neoplasm of upper lobe, unspecified bronchus or lung: Secondary | ICD-10-CM

## 2012-03-10 NOTE — Progress Notes (Signed)
Pt c/o pain, aching in her right neck/shoulder,s tates began after she woke up. Pain 9/10; pt took Oxy-IR 15 mg 6:15 am, states "it eased off but is coming back". Pt c/o fatigue, dry cough, SOB w/minimal activity. Denies loss of appetite, drinks 4 cans Ensure daily. Pt began Medrol dosepak yesterday per Dr Arbutus Ped .

## 2012-03-10 NOTE — Progress Notes (Signed)
  Radiation Oncology         [phone] ________________________________  Name: [name] MRN: [id number]  Date: 03/10/2012  DOB: 03-13-1951  Weekly Radiation Therapy Management  Current Dose: 20 Gy     Planned Dose:  66 Gy  Narrative . . . . . . . . The patient presents for routine under treatment assessment.                                                     Pt c/o pain, aching in her right neck/shoulder,s tates began after she woke up. Pain 9/10; pt took Oxy-IR 15 mg 6:15 am, states "it eased off but is coming back". Pt c/o fatigue, dry cough, SOB w/minimal activity. Denies loss of appetite, drinks 4 cans Ensure daily. Pt began Medrol dosepak yesterday per Dr Arbutus Ped .                                 Set-up films were reviewed.                                 The chart was checked. Physical Findings. . .  weight is 103 lb 9.6 oz (46.993 kg). Her oral temperature is 98.6 F (37 C). Her blood pressure is 115/61 and her pulse is 77. Her respiration is 20. . Weight essentially stable.  No significant changes. Impression . . . . . . . The patient is  tolerating radiation. Plan . . . . . . . . . . . . Continue treatment as planned.  ________________________________  Artist Pais. Kathrynn Running, M.D.

## 2012-03-12 NOTE — Progress Notes (Signed)
No images are attached to the encounter. No scans are attached to the encounter. No scans are attached to the encounter. [facility] OFFICE PROGRESS NOTE  [name], MD [address] [facility] [place], [place]. [place] [address]  DIAGNOSIS: Stage IIIa (T2 A., N2, MX) non-small cell lung cancer, squamous cell carcinoma  PRIOR THERAPY: None  CURRENT THERAPY: Concurrent chemoradiation with weekly carboplatin for an AUC of 2 and paclitaxel at 45 mg per meter squared concurrent with radiation  INTERVAL HISTORY: DAZHA PINTO 61 y.o. female returns for a scheduled regular symptom management visit for followup of of her non-small cell lung cancer, squamous cell carcinoma. She status is receiving weekly systemic chemotherapy in the form of carboplatin for an AUC of 2 and paclitaxel at 45 mg per meter squared. She complains of feeling cold but has had no documented fever. She reports that her back hurts all over. She's had some diarrhea that has caused mild dehydration. She has thrush but has been placed on Diflucan by Dr. Kathrynn Running. She reports that her hair is thinning. She complains that the dose of Benadryl makes her "drunk" and is wondering if the dose could be decreased. She voiced no other specific complaints.  MEDICAL HISTORY: Past Medical History  Diagnosis Date  . Acute myocardial infarction, unspecified site, episode of care unspecified   . Esophageal dysmotility   . Unspecified hemorrhoids without mention of complication   . Hemorrhoids   . Esophageal stricture   . Diaphragmatic hernia without mention of obstruction or gangrene   . 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 Coronary atherosclerosis of unspecified type of vessel, native or graft   . Peripheral neuropathy   . Other and unspecified hyperlipidemia   . Peptic ulcer, unspecified site, unspecified as acute or chronic, without mention of hemorrhage, perforation, or obstruction   .  Unspecified cardiovascular disease   . Depressive disorder, not elsewhere classified   . Anxiety state, unspecified   . COPD (chronic obstructive pulmonary disease)   . GERD (gastroesophageal reflux disease)   . Candida esophagitis     on multiple occasions  . C. difficile diarrhea   . PVD (peripheral vascular disease)   . Headache   . Weight loss 06/13/2011    scheduled here by family doctor  . CHF (congestive heart failure) 2011  . Asthma   . Hypertension     ALLERGIES:  is allergic to amiodarone and clopidogrel bisulfate.  MEDICATIONS:  Current Outpatient Prescriptions  Medication Sig Dispense Refill  . albuterol (PROVENTIL HFA;VENTOLIN HFA) 108 (90 BASE) MCG/ACT inhaler Inhale 2 puffs into the lungs every 6 (six) hours as needed. For wheezing      . Alum & Mag Hydroxide-Simeth (MAGIC MOUTHWASH) SOLN Take 5 mLs by mouth 4 (four) times daily as needed.  240 mL  0  . aspirin 81 MG EC tablet Take 81 mg by mouth daily.        . budesonide-formoterol (SYMBICORT) 160-4.5 MCG/ACT inhaler Inhale 2 puffs into the lungs 2 (two) times daily.      . Cholecalciferol (VITAMIN D-3) 5000 UNITS TABS Take 1 tablet by mouth daily.      Marland Kitchen dextromethorphan-guaiFENesin (MUCINEX DM) 30-600 MG per 12 hr tablet Take 1 tablet by mouth every 12 (twelve) hours as needed. For cough      . ezetimibe (ZETIA) 10 MG tablet Take 10 mg by mouth daily.        . famotidine (PEPCID) 20 MG tablet  Take 20 mg by mouth at bedtime.      . fluconazole (DIFLUCAN) 100 MG tablet Take 1 tablet (100 mg total) by mouth daily. Take 2 the first day  15 tablet  0  . fluticasone (FLONASE) 50 MCG/ACT nasal spray Place 2 sprays into the nose 2 (two) times daily.        Marland Kitchen HYDROcodone-acetaminophen (NORCO) 10-325 MG per tablet Take 1-2 tablets by mouth every 4 (four) hours as needed. For pain      . Hydrocodone-Acetaminophen 5-300 MG TABS Take 1-2 tablets by mouth every 4 (four) hours.  120 each  2  . methylPREDNISolone (MEDROL, PAK,) 4  MG tablet follow package directions  21 tablet  0  . montelukast (SINGULAIR) 10 MG tablet Take 10 mg by mouth at bedtime.        . nebivolol (BYSTOLIC) 2.5 MG tablet Take 1 tablet (2.5 mg total) by mouth daily.  30 tablet  0  . nitroGLYCERIN (NITROSTAT) 0.4 MG SL tablet Place 0.4 mg under the tongue every 5 (five) minutes as needed. For chest pain      . nystatin (MYCOSTATIN) 100000 UNIT/ML suspension Take 5 mLs (500,000 Units total) by mouth 2 (two) times daily.  60 mL  0  . omeprazole (PRILOSEC) 20 MG capsule Take 20 mg by mouth 2 (two) times daily.        Marland Kitchen oxyCODONE (OXY IR/ROXICODONE) 5 MG immediate release tablet Take 1-3 tablets (5-15 mg total) by mouth every 4 (four) hours as needed for pain.  120 tablet  0  . prasugrel (EFFIENT) 10 MG TABS Take 10 mg by mouth daily.       . prochlorperazine (COMPAZINE) 10 MG tablet Take 10 mg by mouth every 6 (six) hours as needed.      . tiotropium (SPIRIVA) 18 MCG inhalation capsule Place 18 mcg into inhaler and inhale daily.        SURGICAL HISTORY:  Past Surgical History  Procedure Date  . Tonsillectomy 1974  . Fracture lt jaw 1976  . Breast surgery 1969, 1970    bilateral  . Epigastric hernia repair 2008    Dr Dorris Fetch  . Coronary artery bypass graft 1995  . Carpal tunnel release   . Total abdominal hysterectomy 1998  . Laminectomy   . Coronary angioplasty     multiple  . Subclavian vein angioplasty / stenting     REVIEW OF SYSTEMS:  Pertinent items are noted in HPI.   PHYSICAL EXAMINATION: General appearance: alert, cooperative, appears stated age and no distress Head: Normocephalic, without obvious abnormality, atraumatic Neck: no adenopathy, no carotid bruit, no JVD, supple, symmetrical, trachea midline and thyroid not enlarged, symmetric, no tenderness/mass/nodules Lymph nodes: Cervical, supraclavicular, and axillary nodes normal. Resp: clear to auscultation bilaterally Back: Generalized tenderness over the vertebral  processes, no paraspinal muscle spasms Cardio: regular rate and rhythm, S1, S2 normal, no murmur, click, rub or gallop GI: soft, non-tender; bowel sounds normal; no masses,  no organomegaly Extremities: extremities normal, atraumatic, no cyanosis or edema Neurologic: Alert and oriented X 3, normal strength and tone. Normal symmetric reflexes. Normal coordination and gait Mouth: Reveals thrush  ECOG PERFORMANCE STATUS: 1 - Symptomatic but completely ambulatory  Blood pressure 105/62, pulse 90, temperature 97.8 F (36.6 C), temperature source Oral, resp. rate 20, height 5\' 7"  (1.702 m), weight 101 lb 12.8 oz (46.176 kg).  LABORATORY DATA: Lab Results  Component Value Date   WBC 6.6 03/06/2012   HGB 12.3 03/06/2012  HCT 35.4 03/06/2012   MCV 94.4 03/06/2012   PLT 206 03/06/2012      Chemistry      Component Value Date/Time   NA 137 03/06/2012 0907   NA 139 02/07/2012 1227   K 4.0 03/06/2012 0907   K 4.2 02/07/2012 1227   CL 104 03/06/2012 0907   CL 101 02/07/2012 1227   CO2 22 03/06/2012 0907   CO2 29 02/07/2012 1227   BUN 11.0 03/06/2012 0907   BUN 9 02/07/2012 1227   CREATININE 0.6 03/06/2012 0907   CREATININE 0.50 02/07/2012 1227      Component Value Date/Time   CALCIUM 9.8 03/06/2012 0907   CALCIUM 10.4 02/07/2012 1227   ALKPHOS 106 03/06/2012 0907   ALKPHOS 106 02/07/2012 1227   AST 18 03/06/2012 0907   AST 18 02/07/2012 1227   ALT 20 03/06/2012 0907   ALT 13 02/07/2012 1227   BILITOT 0.50 03/06/2012 0907   BILITOT 0.5 02/07/2012 1227       RADIOGRAPHIC STUDIES:  Nm Pet Image Initial (pi) Skull Base To Thigh  02/15/2012  *RADIOLOGY REPORT*  Clinical Data: Initial treatment strategy for new lung mass.  NUCLEAR MEDICINE PET SKULL BASE TO THIGH  Fasting Blood Glucose:  103  Technique:  17.9 mCi F-18 FDG was injected intravenously. CT data was obtained and used for attenuation correction and anatomic localization only.  (This was not acquired as a diagnostic CT  examination.) Additional exam technical data entered on technologist worksheet.  Comparison:  Chest radiographs dated 02/07/2012  Findings:  Neck: No hypermetabolic lymph nodes in the neck.  Chest:  2.9 x 3.7 cm spiculated posterior right upper lobe mass (series 2/image 85), max SUV 11.8, compatible with primary bronchogenic neoplasm.  1.3 cm short axis right prevascular node (series 2/image 73), max SUV 6.6.  1.2 cm short-axis precarinal node (series 2/image 86), max SUV 6.1.  Coronary atherosclerosis.  Postsurgical changes related to prior CABG.  Abdomen/Pelvis:  No abnormal hypermetabolic activity within the liver, pancreas, adrenal glands, or spleen.  No hypermetabolic lymph nodes in the abdomen or pelvis.  Atherosclerotic calcifications of the abdominal aorta and branch vessels.  Skeleton:  No focal hypermetabolic activity to suggest skeletal metastasis.  Prior thoracolumbar fixation.  IMPRESSION: 2.9 x 3.7 cm spiculated posterior right upper lobe mass, max SUV 11.8, compatible with primary bronchogenic neoplasm.  Prevascular and precarinal nodal metastases, as described above, max SUV 6.1.  No evidence of metastatic disease in the neck, abdomen, or pelvis.   Original Report Authenticated By: Charline Bills, M.D.      ASSESSMENT/PLAN: Patient is a pleasant 61 year old white female recently diagnosed with stage IIIa (T2 A., N2, MX) non-small cell lung cancer, squamous cell carcinoma. She is currently undergoing a course of concurrent chemotherapy therapy and radiation with radiation of the care Dr. Kathrynn Running. The patient was discussed with Dr. Melvyn Neth today in Dr. Arbutus Ped absence. She will continue with her course of concurrent chemoradiation as scheduled. She is to take the course of Diflucan as prescribed by Dr. Kathrynn Running for oral candidiasis. She'll followup with Dr. Arbutus Ped in 2 weeks to repeat CBC differential and C. met.    Laural Benes, Rajni Holsworth E, PA-C     All questions were answered. The patient knows  to call the clinic with any problems, questions or concerns. We can certainly see the patient much sooner if necessary.  I spent 20 minutes counseling the patient face to face. The total time spent in the appointment was 30 minutes.

## 2012-03-13 ENCOUNTER — Ambulatory Visit: Payer: Medicaid Other | Admitting: Nutrition

## 2012-03-13 ENCOUNTER — Ambulatory Visit
Admission: RE | Admit: 2012-03-13 | Discharge: 2012-03-13 | Disposition: A | Discharge status: Home / Self Care | Payer: Medicaid Other | Source: Ambulatory Visit | Attending: Radiation Oncology | Admitting: Radiation Oncology

## 2012-03-13 ENCOUNTER — Other Ambulatory Visit (HOSPITAL_BASED_OUTPATIENT_CLINIC_OR_DEPARTMENT_OTHER): Payer: Medicaid Other | Admitting: Lab

## 2012-03-13 ENCOUNTER — Ambulatory Visit (HOSPITAL_BASED_OUTPATIENT_CLINIC_OR_DEPARTMENT_OTHER): Payer: Medicaid Other | Admitting: Internal Medicine

## 2012-03-13 ENCOUNTER — Other Ambulatory Visit: Payer: Medicaid Other | Admitting: Lab

## 2012-03-13 ENCOUNTER — Telehealth: Payer: Self-pay | Admitting: Internal Medicine

## 2012-03-13 ENCOUNTER — Ambulatory Visit (HOSPITAL_BASED_OUTPATIENT_CLINIC_OR_DEPARTMENT_OTHER): Payer: Medicaid Other

## 2012-03-13 VITALS — BP 95/71 | HR 72 | Temp 97.3°F | Resp 20 | Ht 67.0 in | Wt 102.0 lb

## 2012-03-13 DIAGNOSIS — R630 Anorexia: Secondary | ICD-10-CM

## 2012-03-13 DIAGNOSIS — C3410 Malignant neoplasm of upper lobe, unspecified bronchus or lung: Secondary | ICD-10-CM

## 2012-03-13 DIAGNOSIS — Z5111 Encounter for antineoplastic chemotherapy: Secondary | ICD-10-CM

## 2012-03-13 DIAGNOSIS — C3490 Malignant neoplasm of unspecified part of unspecified bronchus or lung: Secondary | ICD-10-CM

## 2012-03-13 LAB — CBC WITH DIFFERENTIAL/PLATELET
BASO%: 0.3 % (ref 0.0–2.0)
EOS%: 1.1 % (ref 0.0–7.0)
HCT: 36 % (ref 34.8–46.6)
LYMPH%: 17.9 % (ref 14.0–49.7)
MCH: 33 pg (ref 25.1–34.0)
MCHC: 33.9 g/dL (ref 31.5–36.0)
MONO%: 13.9 % (ref 0.0–14.0)
NEUT%: 66.8 % (ref 38.4–76.8)
Platelets: 300 10*3/uL (ref 145–400)

## 2012-03-13 LAB — COMPREHENSIVE METABOLIC PANEL (CC13)
AST: 14 U/L (ref 5–34)
Alkaline Phosphatase: 96 U/L (ref 40–150)
BUN: 12 mg/dL (ref 7.0–26.0)
Creatinine: 0.6 mg/dL (ref 0.6–1.1)

## 2012-03-13 MED ORDER — FAMOTIDINE IN NACL 20-0.9 MG/50ML-% IV SOLN
20.0000 mg | Freq: Once | INTRAVENOUS | Status: AC
  Administered 2012-03-13: 20 mg via INTRAVENOUS

## 2012-03-13 MED ORDER — DEXAMETHASONE SODIUM PHOSPHATE 4 MG/ML IJ SOLN
20.0000 mg | Freq: Once | INTRAMUSCULAR | Status: AC
  Administered 2012-03-13: 20 mg via INTRAVENOUS

## 2012-03-13 MED ORDER — ONDANSETRON 16 MG/50ML IVPB (CHCC)
16.0000 mg | Freq: Once | INTRAVENOUS | Status: AC
  Administered 2012-03-13: 16 mg via INTRAVENOUS

## 2012-03-13 MED ORDER — SODIUM CHLORIDE 0.9 % IV SOLN
Freq: Once | INTRAVENOUS | Status: AC
  Administered 2012-03-13: 11:00:00 via INTRAVENOUS

## 2012-03-13 MED ORDER — SODIUM CHLORIDE 0.9 % IV SOLN
194.8000 mg | Freq: Once | INTRAVENOUS | Status: AC
  Administered 2012-03-13: 190 mg via INTRAVENOUS
  Filled 2012-03-13: qty 19

## 2012-03-13 MED ORDER — DIPHENHYDRAMINE HCL 50 MG/ML IJ SOLN
50.0000 mg | Freq: Once | INTRAMUSCULAR | Status: AC
  Administered 2012-03-13: 50 mg via INTRAVENOUS

## 2012-03-13 MED ORDER — PACLITAXEL CHEMO INJECTION 300 MG/50ML
45.0000 mg/m2 | Freq: Once | INTRAVENOUS | Status: AC
  Administered 2012-03-13: 66 mg via INTRAVENOUS
  Filled 2012-03-13: qty 11

## 2012-03-13 NOTE — Telephone Encounter (Signed)
appts made and printed for pt aom °

## 2012-03-13 NOTE — Progress Notes (Signed)
I spoke with patient in the chemotherapy area. She reports that she is continuing to eat. She is drinking Ensure Plus 4 times a day. She is complaining of mouth blisters, however, she states this has not affected her oral intake. Her weight has increased to 102 pounds on October 21 from 100.9 pounds on September 27.  Nutrition diagnosis: Unintended weight loss has improved.  Intervention: Patient was encouraged to continue high-calorie, high-protein foods throughout the day. She was encouraged to continue Ensure Plus 4 times a day.  Monitoring, evaluation, goals: Patient has tolerated oral intake to promote weight maintenance with slight weight gain.  Next visit: I will continue to follow patient as needed throughout chemotherapy treatments.

## 2012-03-13 NOTE — Progress Notes (Signed)
[facility] [phone]   [phone]  OFFICE PROGRESS NOTE  [name], MD [address] [facility], [place]. [place] [address]  DIAGNOSIS: Stage IIIa (T2a, N2, MX) non-small cell lung cancer, squamous cell carcinoma diagnosed in September of 2013.  PRIOR THERAPY: None.  CURRENT THERAPY: Concurrent chemoradiation with weekly carboplatin for AUC of 2 and paclitaxel 45 mg/M2. The patient is status post 2 cycles.  INTERVAL HISTORY: [name] 61 y.o. female returns to the clinic today for followup visit. The patient is feeling fine today with no specific complaints. She is tolerating her concurrent chemoradiation fairly well with no significant adverse effects. She denied having any significant fever or chills, no nausea or vomiting, no peripheral neuropathy. The patient denied having any significant chest pain, shortness breath, cough or hemoptysis. She also denied having any significant weight loss and she eats good but not beginning much weight. She is here today to start cycle #3 of her concurrent chemoradiation.  MEDICAL HISTORY: Past Medical History  Diagnosis Date  . Acute myocardial infarction, unspecified site, episode of care unspecified   . Esophageal dysmotility   . Unspecified hemorrhoids without mention of complication   . Hemorrhoids   . Esophageal stricture   . Diaphragmatic hernia without mention of obstruction or gangrene   . 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 Coronary atherosclerosis of unspecified type of vessel, native or graft   . Peripheral neuropathy   . Other and unspecified hyperlipidemia   . Peptic ulcer, unspecified site, unspecified as acute or chronic, without mention of hemorrhage, perforation, or obstruction   . Unspecified cardiovascular disease   . Depressive disorder, not elsewhere classified   . Anxiety state, unspecified   . COPD (chronic obstructive pulmonary  disease)   . GERD (gastroesophageal reflux disease)   . Candida esophagitis     on multiple occasions  . C. difficile diarrhea   . PVD (peripheral vascular disease)   . Headache   . Weight loss 06/13/2011    scheduled here by family doctor  . CHF (congestive heart failure) 2011  . Asthma   . Hypertension     ALLERGIES:  is allergic to amiodarone and clopidogrel bisulfate.  MEDICATIONS:  Current Outpatient Prescriptions  Medication Sig Dispense Refill  . albuterol (PROVENTIL HFA;VENTOLIN HFA) 108 (90 BASE) MCG/ACT inhaler Inhale 2 puffs into the lungs every 6 (six) hours as needed. For wheezing      . Alum & Mag Hydroxide-Simeth (MAGIC MOUTHWASH) SOLN Take 5 mLs by mouth 4 (four) times daily as needed.  240 mL  0  . aspirin 81 MG EC tablet Take 81 mg by mouth daily.        . budesonide-formoterol (SYMBICORT) 160-4.5 MCG/ACT inhaler Inhale 2 puffs into the lungs 2 (two) times daily.      . Cholecalciferol (VITAMIN D-3) 5000 UNITS TABS Take 1 tablet by mouth daily.      Marland Kitchen dextromethorphan-guaiFENesin (MUCINEX DM) 30-600 MG per 12 hr tablet Take 1 tablet by mouth every 12 (twelve) hours as needed. For cough      . ezetimibe (ZETIA) 10 MG tablet Take 10 mg by mouth daily.        . famotidine (PEPCID) 20 MG tablet Take 20 mg by mouth at bedtime.      . fluconazole (DIFLUCAN) 100 MG tablet Take 1 tablet (100 mg total) by mouth daily. Take 2 the first day  15 tablet  0  . fluticasone (FLONASE) 50 MCG/ACT nasal spray Place 2 sprays into the nose 2 (two) times daily.        Marland Kitchen HYDROcodone-acetaminophen (NORCO) 10-325 MG per tablet Take 1-2 tablets by mouth every 4 (four) hours as needed. For pain      . Hydrocodone-Acetaminophen 5-300 MG TABS Take 1-2 tablets by mouth every 4 (four) hours.  120 each  2  . methylPREDNISolone (MEDROL, PAK,) 4 MG tablet follow package directions  21 tablet  0  . montelukast (SINGULAIR) 10 MG tablet Take 10 mg by mouth at bedtime.        . nebivolol (BYSTOLIC) 2.5  MG tablet Take 1 tablet (2.5 mg total) by mouth daily.  30 tablet  0  . nitroGLYCERIN (NITROSTAT) 0.4 MG SL tablet Place 0.4 mg under the tongue every 5 (five) minutes as needed. For chest pain      . nystatin (MYCOSTATIN) 100000 UNIT/ML suspension Take 5 mLs (500,000 Units total) by mouth 2 (two) times daily.  60 mL  0  . omeprazole (PRILOSEC) 20 MG capsule Take 20 mg by mouth 2 (two) times daily.        Marland Kitchen oxyCODONE (OXY IR/ROXICODONE) 5 MG immediate release tablet Take 1-3 tablets (5-15 mg total) by mouth every 4 (four) hours as needed for pain.  120 tablet  0  . prasugrel (EFFIENT) 10 MG TABS Take 10 mg by mouth daily.       . prochlorperazine (COMPAZINE) 10 MG tablet Take 10 mg by mouth every 6 (six) hours as needed.      . tiotropium (SPIRIVA) 18 MCG inhalation capsule Place 18 mcg into inhaler and inhale daily.        SURGICAL HISTORY:  Past Surgical History  Procedure Date  . Tonsillectomy 1974  . Fracture lt jaw 1976  . Breast surgery 1969, 1970    bilateral  . Epigastric hernia repair 2008    Dr Dorris Fetch  . Coronary artery bypass graft 1995  . Carpal tunnel release   . Total abdominal hysterectomy 1998  . Laminectomy   . Coronary angioplasty     multiple  . Subclavian vein angioplasty / stenting     REVIEW OF SYSTEMS:  A comprehensive review of systems was negative.   PHYSICAL EXAMINATION: General appearance: alert, cooperative and no distress Head: Normocephalic, without obvious abnormality, atraumatic Neck: no adenopathy Lymph nodes: Cervical, supraclavicular, and axillary nodes normal. Resp: clear to auscultation bilaterally Cardio: regular rate and rhythm, S1, S2 normal, no murmur, click, rub or gallop GI: soft, non-tender; bowel sounds normal; no masses,  no organomegaly Extremities: extremities normal, atraumatic, no cyanosis or edema Neurologic: Alert and oriented X 3, normal strength and tone. Normal symmetric reflexes. Normal coordination and gait  ECOG  PERFORMANCE STATUS: 1 - Symptomatic but completely ambulatory  Blood pressure 95/71, pulse 72, temperature 97.3 F (36.3 C), temperature source Oral, resp. rate 20, height 5\' 7"  (1.702 m), weight 102 lb (46.267 kg).  LABORATORY DATA: Lab Results  Component Value Date   WBC 6.6 03/13/2012   HGB 12.2 03/13/2012   HCT 36.0 03/13/2012   MCV 97.3 03/13/2012   PLT 300 03/13/2012      Chemistry      Component Value Date/Time   NA 137 03/06/2012 0907   NA 139 02/07/2012 1227   K 4.0 03/06/2012 0907   K 4.2 02/07/2012 1227   CL 104 03/06/2012 0907   CL 101 02/07/2012 1227   CO2 22 03/06/2012 0907  CO2 29 02/07/2012 1227   BUN 11.0 03/06/2012 0907   BUN 9 02/07/2012 1227   CREATININE 0.6 03/06/2012 0907   CREATININE 0.50 02/07/2012 1227      Component Value Date/Time   CALCIUM 9.8 03/06/2012 0907   CALCIUM 10.4 02/07/2012 1227   ALKPHOS 106 03/06/2012 0907   ALKPHOS 106 02/07/2012 1227   AST 18 03/06/2012 0907   AST 18 02/07/2012 1227   ALT 20 03/06/2012 0907   ALT 13 02/07/2012 1227   BILITOT 0.50 03/06/2012 0907   BILITOT 0.5 02/07/2012 1227       RADIOGRAPHIC STUDIES: Nm Pet Image Initial (pi) Skull Base To Thigh  02/15/2012  *RADIOLOGY REPORT*  Clinical Data: Initial treatment strategy for new lung mass.  NUCLEAR MEDICINE PET SKULL BASE TO THIGH  Fasting Blood Glucose:  103  Technique:  17.9 mCi F-18 FDG was injected intravenously. CT data was obtained and used for attenuation correction and anatomic localization only.  (This was not acquired as a diagnostic CT examination.) Additional exam technical data entered on technologist worksheet.  Comparison:  Chest radiographs dated 02/07/2012  Findings:  Neck: No hypermetabolic lymph nodes in the neck.  Chest:  2.9 x 3.7 cm spiculated posterior right upper lobe mass (series 2/image 85), max SUV 11.8, compatible with primary bronchogenic neoplasm.  1.3 cm short axis right prevascular node (series 2/image 73), max SUV 6.6.  1.2 cm short-axis  precarinal node (series 2/image 86), max SUV 6.1.  Coronary atherosclerosis.  Postsurgical changes related to prior CABG.  Abdomen/Pelvis:  No abnormal hypermetabolic activity within the liver, pancreas, adrenal glands, or spleen.  No hypermetabolic lymph nodes in the abdomen or pelvis.  Atherosclerotic calcifications of the abdominal aorta and branch vessels.  Skeleton:  No focal hypermetabolic activity to suggest skeletal metastasis.  Prior thoracolumbar fixation.  IMPRESSION: 2.9 x 3.7 cm spiculated posterior right upper lobe mass, max SUV 11.8, compatible with primary bronchogenic neoplasm.  Prevascular and precarinal nodal metastases, as described above, max SUV 6.1.  No evidence of metastatic disease in the neck, abdomen, or pelvis.   Original Report Authenticated By: Charline Bills, M.D.     ASSESSMENT: This is a very pleasant 61 years old white female with recently diagnosed as stage IIIa non-small cell lung cancer currently undergoing concurrent chemoradiation with weekly carboplatin and paclitaxel is status post 2 cycles.  PLAN: The patient is doing fine and tolerating her treatment fairly well. I recommended for her to continue with the same treatment regimen for now. She will receive cycle #3 today. For the poor appetite she was started on Medrol Dosepak but she did not notice any significant change in her condition. I would see her back for followup visit in 2 weeks for evaluation and management any adverse effect of her chemotherapy. The patient was advised to call immediately if she has any concerning symptoms in the interval.  All questions were answered. The patient knows to call the clinic with any problems, questions or concerns. We can certainly see the patient much sooner if necessary.  I spent 15 minutes counseling the patient face to face. The total time spent in the appointment was 25 minutes.

## 2012-03-13 NOTE — Patient Instructions (Signed)
[facility] Discharge Instructions for Patients Receiving Chemotherapy  Today you received the following chemotherapy agents Taxol/Carboplatin  To help prevent nausea and vomiting after your treatment, we encourage you to take your nausea medication   If you develop nausea and vomiting that is not controlled by your nausea medication, call the clinic. If it is after clinic hours your family physician or the after hours number for the clinic or go to the Emergency Department.   BELOW ARE SYMPTOMS THAT SHOULD BE REPORTED IMMEDIATELY:  *FEVER GREATER THAN 100.5 F  *CHILLS WITH OR WITHOUT FEVER  NAUSEA AND VOMITING THAT IS NOT CONTROLLED WITH YOUR NAUSEA MEDICATION  *UNUSUAL SHORTNESS OF BREATH  *UNUSUAL BRUISING OR BLEEDING  TENDERNESS IN MOUTH AND THROAT WITH OR WITHOUT PRESENCE OF ULCERS  *URINARY PROBLEMS  *BOWEL PROBLEMS  UNUSUAL RASH Items with * indicate a potential emergency and should be followed up as soon as possible.  One of the nurses will contact you 24 hours after your treatment. Please let the nurse know about any problems that you may have experienced. Feel free to call the clinic you have any questions or concerns. The clinic phone number is [phone].   I have been informed and understand all the instructions given to me. I know to contact the clinic, my physician, or go to the Emergency Department if any problems should occur. I do not have any questions at this time, but understand that I may call the clinic during office hours   should I have any questions or need assistance in obtaining follow up care.    __________________________________________  _____________  __________ Signature of Patient or Authorized Representative            Date                   Time    __________________________________________ Nurse's Signature    

## 2012-03-13 NOTE — Patient Instructions (Signed)
No significant adverse effect from the chemotherapy. We'll continue with the same treatment plan and you will receive cycle #3 today. Followup in 2 weeks

## 2012-03-14 ENCOUNTER — Ambulatory Visit
Admission: RE | Admit: 2012-03-14 | Discharge: 2012-03-14 | Disposition: A | Discharge status: Home / Self Care | Payer: Medicaid Other | Source: Ambulatory Visit | Attending: Radiation Oncology | Admitting: Radiation Oncology

## 2012-03-14 MED ORDER — ACYCLOVIR 5 % EX OINT: TOPICAL_OINTMENT | CUTANEOUS | Status: DC

## 2012-03-15 ENCOUNTER — Ambulatory Visit: Payer: Medicaid Other

## 2012-03-15 ENCOUNTER — Ambulatory Visit: Admission: RE | Admit: 2012-03-15 | Payer: Medicaid Other | Source: Ambulatory Visit

## 2012-03-16 ENCOUNTER — Encounter: Payer: Self-pay | Admitting: Radiation Oncology

## 2012-03-16 ENCOUNTER — Ambulatory Visit
Admission: RE | Admit: 2012-03-16 | Discharge: 2012-03-16 | Disposition: A | Discharge status: Home / Self Care | Payer: Medicaid Other | Source: Ambulatory Visit | Attending: Radiation Oncology | Admitting: Radiation Oncology

## 2012-03-16 ENCOUNTER — Ambulatory Visit: Payer: Medicaid Other

## 2012-03-16 VITALS — BP 104/64 | HR 90 | Temp 98.0°F | Resp 20 | Wt 105.4 lb

## 2012-03-16 DIAGNOSIS — C3410 Malignant neoplasm of upper lobe, unspecified bronchus or lung: Secondary | ICD-10-CM

## 2012-03-16 MED ORDER — SUCRALFATE 1 GM/10ML PO SUSP: 1.0000 g | Freq: Four times a day (QID) | ORAL | Status: DC

## 2012-03-16 NOTE — Progress Notes (Signed)
  Radiation Oncology         [phone] ________________________________  Name: [name] MRN: [id number]  Date: 03/16/2012  DOB: 02-Sep-1950  Weekly Radiation Therapy Management  Current Dose: 26 Gy     Planned Dose:  66 Gy  Narrative . . . . . . . . The patient presents for routine under treatment assessment.      Pt continues to report "generalized pain all over", and states she's had HA in her forehead past few days. Pt c/o painful swallowing, on Nystatin, gets little relief of throat pain. Pt has hoarseness today, prod cough w/white-yellow phlegm, SOB w/minimal exertion. She also states her upper right chest "is sore to the touch". Pt takes Oxycodone prn pain w/fair relief. Pt will discuss Lortab or MMW w/dr today for her throat pain. Pt completed Medrol dose pack.  Pt drinking Ensure, 5 cans daily.                                 Set-up films were reviewed.                                 The chart was checked. Physical Findings. . .  weight is 105 lb 6.4 oz (47.809 kg). Her oral temperature is 98 F (36.7 C). Her blood pressure is 104/64 and her pulse is 90. Her respiration is 20 and oxygen saturation is 98%. . Weight essentially stable.  No significant changes. Impression . . . . . . . The patient is  tolerating radiation. Plan . . . . . . . . . . . . Continue treatment as planned.  ________________________________  Artist Pais. Kathrynn Running, M.D.

## 2012-03-16 NOTE — Progress Notes (Signed)
Pt continues to report "generalized pain all over", and states she's had HA in her forehead past few days. Pt c/o painful swallowing, on Nystatin, gets little relief of throat pain.  Pt has hoarseness today, prod cough w/white-yellow phlegm, SOB w/minimal exertion. She also states her upper right chest "is sore to the touch". Pt takes Oxycodone prn pain w/fair relief. Pt will discuss Lortab or MMW w/dr today for her throat pain. Pt completed Medrol dose pack. Pt drinking Ensure, 5 cans daily.

## 2012-03-17 ENCOUNTER — Ambulatory Visit
Admission: RE | Admit: 2012-03-17 | Discharge: 2012-03-17 | Disposition: A | Discharge status: Home / Self Care | Payer: Medicaid Other | Source: Ambulatory Visit | Attending: Radiation Oncology | Admitting: Radiation Oncology

## 2012-03-17 ENCOUNTER — Ambulatory Visit: Payer: Medicaid Other

## 2012-03-20 ENCOUNTER — Ambulatory Visit
Admission: RE | Admit: 2012-03-20 | Discharge: 2012-03-20 | Disposition: A | Discharge status: Home / Self Care | Payer: Medicaid Other | Source: Ambulatory Visit | Attending: Radiation Oncology | Admitting: Radiation Oncology

## 2012-03-20 ENCOUNTER — Ambulatory Visit: Payer: Medicaid Other | Admitting: Nutrition

## 2012-03-20 ENCOUNTER — Other Ambulatory Visit (HOSPITAL_BASED_OUTPATIENT_CLINIC_OR_DEPARTMENT_OTHER): Payer: Medicaid Other | Admitting: Lab

## 2012-03-20 ENCOUNTER — Ambulatory Visit (HOSPITAL_BASED_OUTPATIENT_CLINIC_OR_DEPARTMENT_OTHER): Payer: Medicaid Other

## 2012-03-20 ENCOUNTER — Ambulatory Visit: Payer: Medicaid Other

## 2012-03-20 VITALS — BP 103/65 | HR 102 | Temp 98.5°F

## 2012-03-20 DIAGNOSIS — C3410 Malignant neoplasm of upper lobe, unspecified bronchus or lung: Secondary | ICD-10-CM

## 2012-03-20 DIAGNOSIS — Z5111 Encounter for antineoplastic chemotherapy: Secondary | ICD-10-CM

## 2012-03-20 DIAGNOSIS — C3490 Malignant neoplasm of unspecified part of unspecified bronchus or lung: Secondary | ICD-10-CM

## 2012-03-20 LAB — CBC WITH DIFFERENTIAL/PLATELET
BASO%: 0.2 % (ref 0.0–2.0)
Eosinophils Absolute: 0 10*3/uL (ref 0.0–0.5)
HCT: 33.1 % — ABNORMAL LOW (ref 34.8–46.6)
LYMPH%: 11.9 % — ABNORMAL LOW (ref 14.0–49.7)
MCHC: 33.8 g/dL (ref 31.5–36.0)
MONO#: 0.6 10*3/uL (ref 0.1–0.9)
NEUT%: 77.4 % — ABNORMAL HIGH (ref 38.4–76.8)
Platelets: 191 10*3/uL (ref 145–400)
WBC: 6 10*3/uL (ref 3.9–10.3)

## 2012-03-20 LAB — COMPREHENSIVE METABOLIC PANEL (CC13)
BUN: 10 mg/dL (ref 7.0–26.0)
CO2: 25 mEq/L (ref 22–29)
Calcium: 9.9 mg/dL (ref 8.4–10.4)
Chloride: 102 mEq/L (ref 98–107)
Creatinine: 0.6 mg/dL (ref 0.6–1.1)
Glucose: 79 mg/dl (ref 70–99)

## 2012-03-20 MED ORDER — ONDANSETRON 16 MG/50ML IVPB (CHCC)
16.0000 mg | Freq: Once | INTRAVENOUS | Status: AC
  Administered 2012-03-20: 16 mg via INTRAVENOUS

## 2012-03-20 MED ORDER — FAMOTIDINE IN NACL 20-0.9 MG/50ML-% IV SOLN
20.0000 mg | Freq: Once | INTRAVENOUS | Status: AC
  Administered 2012-03-20: 20 mg via INTRAVENOUS

## 2012-03-20 MED ORDER — SODIUM CHLORIDE 0.9 % IV SOLN
Freq: Once | INTRAVENOUS | Status: AC
  Administered 2012-03-20: 11:00:00 via INTRAVENOUS

## 2012-03-20 MED ORDER — SODIUM CHLORIDE 0.9 % IV SOLN
194.8000 mg | Freq: Once | INTRAVENOUS | Status: AC
  Administered 2012-03-20: 190 mg via INTRAVENOUS
  Filled 2012-03-20: qty 19

## 2012-03-20 MED ORDER — DIPHENHYDRAMINE HCL 50 MG/ML IJ SOLN
50.0000 mg | Freq: Once | INTRAMUSCULAR | Status: AC
  Administered 2012-03-20: 50 mg via INTRAVENOUS

## 2012-03-20 MED ORDER — DEXAMETHASONE SODIUM PHOSPHATE 4 MG/ML IJ SOLN
20.0000 mg | Freq: Once | INTRAMUSCULAR | Status: AC
  Administered 2012-03-20: 20 mg via INTRAVENOUS

## 2012-03-20 MED ORDER — PACLITAXEL CHEMO INJECTION 300 MG/50ML
45.0000 mg/m2 | Freq: Once | INTRAVENOUS | Status: AC
  Administered 2012-03-20: 66 mg via INTRAVENOUS
  Filled 2012-03-20: qty 11

## 2012-03-20 NOTE — Patient Instructions (Signed)
[facility] Discharge Instructions for Patients Receiving Chemotherapy  Today you received the following chemotherapy agents Taxol/Carboplatin To help prevent nausea and vomiting after your treatment, we encourage you to take your nausea medication as prescribed.  If you develop nausea and vomiting that is not controlled by your nausea medication, call the clinic. If it is after clinic hours your family physician or the after hours number for the clinic or go to the Emergency Department.   BELOW ARE SYMPTOMS THAT SHOULD BE REPORTED IMMEDIATELY:  *FEVER GREATER THAN 100.5 F  *CHILLS WITH OR WITHOUT FEVER  NAUSEA AND VOMITING THAT IS NOT CONTROLLED WITH YOUR NAUSEA MEDICATION  *UNUSUAL SHORTNESS OF BREATH  *UNUSUAL BRUISING OR BLEEDING  TENDERNESS IN MOUTH AND THROAT WITH OR WITHOUT PRESENCE OF ULCERS  *URINARY PROBLEMS  *BOWEL PROBLEMS  UNUSUAL RASH Items with * indicate a potential emergency and should be followed up as soon as possible.  One of the nurses will contact you 24 hours after your treatment. Please let the nurse know about any problems that you may have experienced. Feel free to call the clinic you have any questions or concerns. The clinic phone number is [phone].   I have been informed and understand all the instructions given to me. I know to contact the clinic, my physician, or go to the Emergency Department if any problems should occur. I do not have any questions at this time, but understand that I may call the clinic during office hours   should I have any questions or need assistance in obtaining follow up care.    __________________________________________  _____________  __________ Signature of Patient or Authorized Representative            Date                   Time    __________________________________________ Nurse's Signature    

## 2012-03-20 NOTE — Progress Notes (Signed)
I spoke with patient in the chemotherapy room. She states she drinks Ensure Plus 5 times daily. She reports the steroids have caused edema. Her weight was increased to 105.4 pounds on October 24. Weight gain likely reflects fluid retention to some extent.  Nutrition diagnosis: Unintended weight loss has improved, however, I'm unable to determine if this is fluid shifts or lean body mass gain.  Intervention: Patient was educated to continue Ensure Plus 5 times daily. I provided additional samples for patient to take with her today.  Monitoring, evaluation, goals: Patient is tolerating oral intake.  Next visit: I will continue to follow patient as needed throughout chemotherapy.

## 2012-03-21 ENCOUNTER — Ambulatory Visit: Payer: Medicaid Other

## 2012-03-21 ENCOUNTER — Ambulatory Visit
Admission: RE | Admit: 2012-03-21 | Discharge: 2012-03-21 | Disposition: A | Discharge status: Home / Self Care | Payer: Medicaid Other | Source: Ambulatory Visit | Attending: Radiation Oncology | Admitting: Radiation Oncology

## 2012-03-22 ENCOUNTER — Ambulatory Visit: Payer: Medicaid Other

## 2012-03-22 ENCOUNTER — Ambulatory Visit
Admission: RE | Admit: 2012-03-22 | Discharge: 2012-03-22 | Disposition: A | Discharge status: Home / Self Care | Payer: Medicaid Other | Source: Ambulatory Visit | Attending: Radiation Oncology | Admitting: Radiation Oncology

## 2012-03-22 ENCOUNTER — Other Ambulatory Visit: Payer: Self-pay | Admitting: Internal Medicine

## 2012-03-22 DIAGNOSIS — C3410 Malignant neoplasm of upper lobe, unspecified bronchus or lung: Secondary | ICD-10-CM

## 2012-03-23 ENCOUNTER — Ambulatory Visit
Admission: RE | Admit: 2012-03-23 | Discharge: 2012-03-23 | Disposition: A | Discharge status: Home / Self Care | Payer: Medicaid Other | Source: Ambulatory Visit | Attending: Radiation Oncology | Admitting: Radiation Oncology

## 2012-03-23 ENCOUNTER — Ambulatory Visit: Payer: Medicaid Other

## 2012-03-24 ENCOUNTER — Telehealth: Payer: Self-pay | Admitting: Internal Medicine

## 2012-03-24 ENCOUNTER — Ambulatory Visit (HOSPITAL_BASED_OUTPATIENT_CLINIC_OR_DEPARTMENT_OTHER): Payer: Medicaid Other | Admitting: Physician Assistant

## 2012-03-24 ENCOUNTER — Ambulatory Visit: Payer: Medicaid Other

## 2012-03-24 ENCOUNTER — Other Ambulatory Visit (HOSPITAL_BASED_OUTPATIENT_CLINIC_OR_DEPARTMENT_OTHER): Payer: Medicaid Other | Admitting: Lab

## 2012-03-24 ENCOUNTER — Ambulatory Visit
Admission: RE | Admit: 2012-03-24 | Discharge: 2012-03-24 | Disposition: A | Discharge status: Home / Self Care | Payer: Medicaid Other | Source: Ambulatory Visit | Attending: Radiation Oncology | Admitting: Radiation Oncology

## 2012-03-24 ENCOUNTER — Encounter: Payer: Self-pay | Admitting: Physician Assistant

## 2012-03-24 VITALS — BP 108/63 | HR 90 | Temp 97.4°F | Resp 20 | Ht 67.0 in | Wt 104.4 lb

## 2012-03-24 VITALS — BP 117/61 | HR 79 | Temp 98.4°F | Resp 20 | Wt 104.8 lb

## 2012-03-24 DIAGNOSIS — C3490 Malignant neoplasm of unspecified part of unspecified bronchus or lung: Secondary | ICD-10-CM

## 2012-03-24 DIAGNOSIS — C3410 Malignant neoplasm of upper lobe, unspecified bronchus or lung: Secondary | ICD-10-CM

## 2012-03-24 DIAGNOSIS — G609 Hereditary and idiopathic neuropathy, unspecified: Secondary | ICD-10-CM

## 2012-03-24 DIAGNOSIS — K5900 Constipation, unspecified: Secondary | ICD-10-CM

## 2012-03-24 LAB — CBC WITH DIFFERENTIAL/PLATELET
BASO%: 0.4 % (ref 0.0–2.0)
EOS%: 0.4 % (ref 0.0–7.0)
HCT: 35.7 % (ref 34.8–46.6)
LYMPH%: 9.3 % — ABNORMAL LOW (ref 14.0–49.7)
MCH: 35.5 pg — ABNORMAL HIGH (ref 25.1–34.0)
MCHC: 35.7 g/dL (ref 31.5–36.0)
MCV: 99.3 fL (ref 79.5–101.0)
MONO%: 4.3 % (ref 0.0–14.0)
NEUT%: 85.6 % — ABNORMAL HIGH (ref 38.4–76.8)
lymph#: 0.4 10*3/uL — ABNORMAL LOW (ref 0.9–3.3)

## 2012-03-24 LAB — COMPREHENSIVE METABOLIC PANEL (CC13)
ALT: 20 U/L (ref 0–55)
Alkaline Phosphatase: 101 U/L (ref 40–150)
Creatinine: 0.6 mg/dL (ref 0.6–1.1)
Sodium: 138 mEq/L (ref 136–145)
Total Bilirubin: 0.38 mg/dL (ref 0.20–1.20)
Total Protein: 6.7 g/dL (ref 6.4–8.3)

## 2012-03-24 MED ORDER — LACTULOSE 10 GM/15ML PO SOLN: 20.0000 g | Freq: Three times a day (TID) | ORAL | Status: DC

## 2012-03-24 NOTE — Progress Notes (Signed)
  Radiation Oncology         [phone] ________________________________  Name: [name] MRN: [id number]  Date: 03/24/2012  DOB: 1950/10/25  Weekly Radiation Therapy Management  Current Dose: 38 Gy     Planned Dose:  66 Gy  Narrative . . . . . . . . The patient presents for routine under treatment assessment.                                        Pt c/o "all over pain, back pain, slightly sore throat, HA, numbness in hands". She denies loss of appetite, does c/o fatigue, prod cough w/clear sputum. Pt using MMW for thrush, taking Carafate properly.  She states she has script for constipation to pick up today                                 Set-up films were reviewed.                                 The chart was checked. Physical Findings. . .  weight is 104 lb 12.8 oz (47.537 kg). Her oral temperature is 98.4 F (36.9 C). Her blood pressure is 117/61 and her pulse is 79. Her respiration is 20 and oxygen saturation is 99%. . Weight essentially stable.  No significant changes. Impression . . . . . . . The patient is  tolerating radiation. Plan . . . . . . . . . . . . Continue treatment as planned.  ________________________________  Artist Pais. Kathrynn Running, M.D.

## 2012-03-24 NOTE — Telephone Encounter (Signed)
appts made and called and l/m for pt with 11/18 appt info     aom

## 2012-03-24 NOTE — Patient Instructions (Addendum)
Take lactulose as prescribed for your constipation Continue your course of concurrent chemotherapy and radiation therapy Follow up in 3 weeks

## 2012-03-24 NOTE — Progress Notes (Signed)
Pt c/o "all over pain, back pain, slightly sore throat, HA, numbness in hands". She denies loss of appetite, does c/o fatigue, prod cough w/clear sputum. Pt using MMW for thrush, taking Carafate properly. She states she has script for constipation to pick up today.

## 2012-03-24 NOTE — Progress Notes (Signed)
[facility] [phone]   [phone]  OFFICE PROGRESS NOTE  [name], MD [address] [facility] [place], [place]. [place] [address]  DIAGNOSIS: Stage IIIa (T2a, N2, MX) non-small cell lung cancer, squamous cell carcinoma diagnosed in September of 2013.  PRIOR THERAPY: None.  CURRENT THERAPY: Concurrent chemoradiation with weekly carboplatin for AUC of 2 and paclitaxel 45 mg/M2. The patient is status post 4 cycles.  INTERVAL HISTORY: [name] 61 y.o. female returns to the clinic today for followup visit. She complains of significant constipation. This is been an issue prior to beginning concurrent chemoradiation when she had her back surgery. She is been trying stool softeners without significant relief. She also complains of numbness and dropping things and her hands shaking. She states it is hard to write a check. She has a prior history of peripheral neuropathy affecting her right foot from her previous back issues. She's also having some difficulty swallowing. She states that she has made radiation oncology aware of this issue.  She denied having any significant fever or chills, no nausea or vomiting.  The patient denied having any significant chest pain, shortness breath, cough or hemoptysis. She also denied having any significant weight loss and she eats good but notgaining much weight.   MEDICAL HISTORY: Past Medical History  Diagnosis Date  . Acute myocardial infarction, unspecified site, episode of care unspecified   . Esophageal dysmotility   . Unspecified hemorrhoids without mention of complication   . Hemorrhoids   . Esophageal stricture   . Diaphragmatic hernia without mention of obstruction or gangrene   . 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 Coronary atherosclerosis of unspecified type of vessel, native or graft   . Peripheral neuropathy   . Other and unspecified hyperlipidemia   .  Peptic ulcer, unspecified site, unspecified as acute or chronic, without mention of hemorrhage, perforation, or obstruction   . Unspecified cardiovascular disease   . Depressive disorder, not elsewhere classified   . Anxiety state, unspecified   . COPD (chronic obstructive pulmonary disease)   . GERD (gastroesophageal reflux disease)   . Candida esophagitis     on multiple occasions  . C. difficile diarrhea   . PVD (peripheral vascular disease)   . Headache   . Weight loss 06/13/2011    scheduled here by family doctor  . CHF (congestive heart failure) 2011  . Asthma   . Hypertension     ALLERGIES:  is allergic to amiodarone and clopidogrel bisulfate.  MEDICATIONS:  Current Outpatient Prescriptions  Medication Sig Dispense Refill  . acyclovir ointment (ZOVIRAX) 5 % Apply topically every 3 (three) hours. Apply topically to lip areas every 3 hours up to 7 days as needed.  30 g  0  . albuterol (PROVENTIL HFA;VENTOLIN HFA) 108 (90 BASE) MCG/ACT inhaler Inhale 2 puffs into the lungs every 6 (six) hours as needed. For wheezing      . Alum & Mag Hydroxide-Simeth (MAGIC MOUTHWASH) SOLN Take 5 mLs by mouth 4 (four) times daily as needed.  240 mL  0  . aspirin 81 MG EC tablet Take 81 mg by mouth daily.        . budesonide-formoterol (SYMBICORT) 160-4.5 MCG/ACT inhaler Inhale 2 puffs into the lungs 2 (two) times daily.      . Cholecalciferol (VITAMIN D-3) 5000 UNITS TABS Take 1 tablet by mouth daily.      Marland Kitchen dextromethorphan-guaiFENesin (MUCINEX DM) 30-600 MG per  12 hr tablet Take 1 tablet by mouth every 12 (twelve) hours as needed. For cough      . ezetimibe (ZETIA) 10 MG tablet Take 10 mg by mouth daily.        . famotidine (PEPCID) 20 MG tablet Take 20 mg by mouth at bedtime.      . fluticasone (FLONASE) 50 MCG/ACT nasal spray Place 2 sprays into the nose 2 (two) times daily.        . montelukast (SINGULAIR) 10 MG tablet Take 10 mg by mouth at bedtime.        . nebivolol (BYSTOLIC) 2.5 MG  tablet Take 1 tablet (2.5 mg total) by mouth daily.  30 tablet  0  . nitroGLYCERIN (NITROSTAT) 0.4 MG SL tablet Place 0.4 mg under the tongue every 5 (five) minutes as needed. For chest pain      . nystatin (MYCOSTATIN) 100000 UNIT/ML suspension SWISH AND SPIT OR SWALLOW 5 ML BY MOUTH 4 TIMES A DAY AS NEEDED  240 mL  2  . omeprazole (PRILOSEC) 20 MG capsule Take 20 mg by mouth 2 (two) times daily.        Marland Kitchen oxyCODONE (OXY IR/ROXICODONE) 5 MG immediate release tablet Take 1-3 tablets (5-15 mg total) by mouth every 4 (four) hours as needed for pain.  120 tablet  0  . prasugrel (EFFIENT) 10 MG TABS Take 10 mg by mouth daily.       . prochlorperazine (COMPAZINE) 10 MG tablet Take 10 mg by mouth every 6 (six) hours as needed.      . sucralfate (CARAFATE) 1 GM/10ML suspension Take 10 mLs (1 g total) by mouth 4 (four) times daily.  840 mL  0  . tiotropium (SPIRIVA) 18 MCG inhalation capsule Place 18 mcg into inhaler and inhale daily.      Marland Kitchen HYDROcodone-acetaminophen (NORCO) 10-325 MG per tablet Take 1-2 tablets by mouth every 4 (four) hours as needed. For pain      . Hydrocodone-Acetaminophen 5-300 MG TABS Take 1-2 tablets by mouth every 4 (four) hours.  120 each  2  . lactulose (CHRONULAC) 10 GM/15ML solution Take 30 mLs (20 g total) by mouth 3 (three) times daily.  240 mL  0    SURGICAL HISTORY:  Past Surgical History  Procedure Date  . Tonsillectomy 1974  . Fracture lt jaw 1976  . Breast surgery 1969, 1970    bilateral  . Epigastric hernia repair 2008    Dr Dorris Fetch  . Coronary artery bypass graft 1995  . Carpal tunnel release   . Total abdominal hysterectomy 1998  . Laminectomy   . Coronary angioplasty     multiple  . Subclavian vein angioplasty / stenting     REVIEW OF SYSTEMS:  A comprehensive review of systems was negative.   PHYSICAL EXAMINATION: General appearance: alert, cooperative and no distress Head: Normocephalic, without obvious abnormality, atraumatic Neck: no  adenopathy Lymph nodes: Cervical, supraclavicular, and axillary nodes normal. Resp: clear to auscultation bilaterally Cardio: regular rate and rhythm, S1, S2 normal, no murmur, click, rub or gallop GI: soft, non-tender; bowel sounds normal; no masses,  no organomegaly Extremities: extremities normal, atraumatic, no cyanosis or edema Neurologic: Alert and oriented X 3, normal strength and tone. Normal symmetric reflexes. Normal coordination and gait Grip strength is 5 over 5 bilaterally  ECOG PERFORMANCE STATUS: 1 - Symptomatic but completely ambulatory  Blood pressure 108/63, pulse 90, temperature 97.4 F (36.3 C), temperature source Oral, resp. rate 20, height 5'  7" (1.702 m), weight 104 lb 6.4 oz (47.356 kg).  LABORATORY DATA: Lab Results  Component Value Date   WBC 4.5 03/24/2012   HGB 12.7 03/24/2012   HCT 35.7 03/24/2012   MCV 99.3 03/24/2012   PLT 207 03/24/2012      Chemistry      Component Value Date/Time   NA 138 03/24/2012 0834   NA 139 02/07/2012 1227   K 3.8 03/24/2012 0834   K 4.2 02/07/2012 1227   CL 102 03/24/2012 0834   CL 101 02/07/2012 1227   CO2 30* 03/24/2012 0834   CO2 29 02/07/2012 1227   BUN 10.0 03/24/2012 0834   BUN 9 02/07/2012 1227   CREATININE 0.6 03/24/2012 0834   CREATININE 0.50 02/07/2012 1227      Component Value Date/Time   CALCIUM 9.7 03/24/2012 0834   CALCIUM 10.4 02/07/2012 1227   ALKPHOS 101 03/24/2012 0834   ALKPHOS 106 02/07/2012 1227   AST 15 03/24/2012 0834   AST 18 02/07/2012 1227   ALT 20 03/24/2012 0834   ALT 13 02/07/2012 1227   BILITOT 0.38 03/24/2012 0834   BILITOT 0.5 02/07/2012 1227       RADIOGRAPHIC STUDIES: Nm Pet Image Initial (pi) Skull Base To Thigh  02/15/2012  *RADIOLOGY REPORT*  Clinical Data: Initial treatment strategy for new lung mass.  NUCLEAR MEDICINE PET SKULL BASE TO THIGH  Fasting Blood Glucose:  103  Technique:  17.9 mCi F-18 FDG was injected intravenously. CT data was obtained and used for attenuation correction and  anatomic localization only.  (This was not acquired as a diagnostic CT examination.) Additional exam technical data entered on technologist worksheet.  Comparison:  Chest radiographs dated 02/07/2012  Findings:  Neck: No hypermetabolic lymph nodes in the neck.  Chest:  2.9 x 3.7 cm spiculated posterior right upper lobe mass (series 2/image 85), max SUV 11.8, compatible with primary bronchogenic neoplasm.  1.3 cm short axis right prevascular node (series 2/image 73), max SUV 6.6.  1.2 cm short-axis precarinal node (series 2/image 86), max SUV 6.1.  Coronary atherosclerosis.  Postsurgical changes related to prior CABG.  Abdomen/Pelvis:  No abnormal hypermetabolic activity within the liver, pancreas, adrenal glands, or spleen.  No hypermetabolic lymph nodes in the abdomen or pelvis.  Atherosclerotic calcifications of the abdominal aorta and branch vessels.  Skeleton:  No focal hypermetabolic activity to suggest skeletal metastasis.  Prior thoracolumbar fixation.  IMPRESSION: 2.9 x 3.7 cm spiculated posterior right upper lobe mass, max SUV 11.8, compatible with primary bronchogenic neoplasm.  Prevascular and precarinal nodal metastases, as described above, max SUV 6.1.  No evidence of metastatic disease in the neck, abdomen, or pelvis.   Original Report Authenticated By: Charline Bills, M.D.     ASSESSMENT/PLAN: This is a very pleasant 61 years old white female with recently diagnosed as stage IIIa non-small cell lung cancer currently undergoing concurrent chemoradiation with weekly carboplatin and paclitaxel is status post 4 cycles. The patient was discussed with Dr. Darrold Span and Dr. Arbutus Ped absence. The peripheral neuropathy affecting her hands is concerning. We may have to discontinue the paclitaxel portion for the remainder of her concurrent chemoradiation. To address the constipation a prescription for lactulose syrup was sent to her pharmacy of record via E. scribed. She'll continue with her current  chemoradiation and return in 3 weeks with repeat CBC differential and C. met.  Glenroy Crossen E, PA-C .  All questions were answered. The patient knows to call the clinic with any problems, questions  or concerns. We can certainly see the patient much sooner if necessary.  I spent 20 minutes counseling the patient face to face. The total time spent in the appointment was 30 minutes.

## 2012-03-27 ENCOUNTER — Other Ambulatory Visit: Payer: Self-pay | Admitting: Medical Oncology

## 2012-03-27 ENCOUNTER — Ambulatory Visit: Payer: Medicaid Other

## 2012-03-27 ENCOUNTER — Ambulatory Visit (HOSPITAL_BASED_OUTPATIENT_CLINIC_OR_DEPARTMENT_OTHER): Payer: Medicaid Other

## 2012-03-27 ENCOUNTER — Ambulatory Visit: Payer: Medicaid Other | Admitting: Nutrition

## 2012-03-27 ENCOUNTER — Ambulatory Visit
Admission: RE | Admit: 2012-03-27 | Discharge: 2012-03-27 | Disposition: A | Discharge status: Home / Self Care | Payer: Medicaid Other | Source: Ambulatory Visit | Attending: Radiation Oncology | Admitting: Radiation Oncology

## 2012-03-27 VITALS — BP 102/68 | HR 75 | Temp 96.9°F

## 2012-03-27 DIAGNOSIS — Z5111 Encounter for antineoplastic chemotherapy: Secondary | ICD-10-CM

## 2012-03-27 DIAGNOSIS — C3410 Malignant neoplasm of upper lobe, unspecified bronchus or lung: Secondary | ICD-10-CM

## 2012-03-27 MED ORDER — DEXAMETHASONE SODIUM PHOSPHATE 4 MG/ML IJ SOLN
20.0000 mg | Freq: Once | INTRAMUSCULAR | Status: AC
  Administered 2012-03-27: 20 mg via INTRAVENOUS

## 2012-03-27 MED ORDER — PACLITAXEL CHEMO INJECTION 300 MG/50ML
45.0000 mg/m2 | Freq: Once | INTRAVENOUS | Status: AC
  Administered 2012-03-27: 66 mg via INTRAVENOUS
  Filled 2012-03-27: qty 11

## 2012-03-27 MED ORDER — DIPHENHYDRAMINE HCL 50 MG/ML IJ SOLN
50.0000 mg | Freq: Once | INTRAMUSCULAR | Status: AC
  Administered 2012-03-27: 50 mg via INTRAVENOUS

## 2012-03-27 MED ORDER — FAMOTIDINE IN NACL 20-0.9 MG/50ML-% IV SOLN
20.0000 mg | Freq: Once | INTRAVENOUS | Status: AC
  Administered 2012-03-27: 20 mg via INTRAVENOUS

## 2012-03-27 MED ORDER — SODIUM CHLORIDE 0.9 % IV SOLN
194.8000 mg | Freq: Once | INTRAVENOUS | Status: AC
  Administered 2012-03-27: 190 mg via INTRAVENOUS
  Filled 2012-03-27: qty 19

## 2012-03-27 MED ORDER — SODIUM CHLORIDE 0.9 % IV SOLN
Freq: Once | INTRAVENOUS | Status: AC
  Administered 2012-03-27: 11:00:00 via INTRAVENOUS

## 2012-03-27 MED ORDER — ONDANSETRON 16 MG/50ML IVPB (CHCC)
16.0000 mg | Freq: Once | INTRAVENOUS | Status: AC
  Administered 2012-03-27: 16 mg via INTRAVENOUS

## 2012-03-27 NOTE — Patient Instructions (Signed)
[facility] Discharge Instructions for Patients Receiving Chemotherapy  Today you received the following chemotherapy agents Taxol and Carboplatin  To help prevent nausea and vomiting after your treatment, we encourage you to take your nausea medication as prescribed.   If you develop nausea and vomiting that is not controlled by your nausea medication, call the clinic. If it is after clinic hours your family physician or the after hours number for the clinic or go to the Emergency Department.   BELOW ARE SYMPTOMS THAT SHOULD BE REPORTED IMMEDIATELY:  *FEVER GREATER THAN 100.5 F  *CHILLS WITH OR WITHOUT FEVER  NAUSEA AND VOMITING THAT IS NOT CONTROLLED WITH YOUR NAUSEA MEDICATION  *UNUSUAL SHORTNESS OF BREATH  *UNUSUAL BRUISING OR BLEEDING  TENDERNESS IN MOUTH AND THROAT WITH OR WITHOUT PRESENCE OF ULCERS  *URINARY PROBLEMS  *BOWEL PROBLEMS  UNUSUAL RASH Items with * indicate a potential emergency and should be followed up as soon as possible.  Feel free to call the clinic you have any questions or concerns. The clinic phone number is [phone].   I have been informed and understand all the instructions given to me. I know to contact the clinic, my physician, or go to the Emergency Department if any problems should occur. I do not have any questions at this time, but understand that I may call the clinic during office hours   should I have any questions or need assistance in obtaining follow up care.    

## 2012-03-27 NOTE — Progress Notes (Signed)
1330- Pt returned from bathroom, states she noticed one, singular raised hive on her right arm, proximal to IV site.  She states area itches, not painful.  IV site not painful, flows well to gravity, good flashback of blood on occlusion, no redness or swelling to IV site.  Pt has small, round 2x2cm hard, raised area on inner right forearm, proximal and lateral to IV site.  No redness to hive.  Carboplatin stopped, NS only infusing-dhp, rn 1335- Tiana Loft, PA aware of single hive.  Per Dimas Alexandria, will have pharmacy look at area-dhp, rn 1337- Anola Gurney, PharmD chairside to assess area.  Site has reduced in hardness, itchiness per pt since stopping Carboplatin-dhp, rn 1345- Dr. Arbutus Ped aware of pt's single hive.  Per Dr. Arbutus Ped, will stop Carboplatin infusion, d/c pt home today and see pt back next week for infusion.  Pt is agreeable to plan, will call with any questions or problems-dhp, rn 1355- Remainder of carboplatin returned to pharmacy for disposal-dhp, rn

## 2012-03-27 NOTE — Progress Notes (Signed)
Patient continues to eat but with more difficulty.  She reports difficulty swallowing but is tolerating a grilled ham and cheese sandwich today.  She has had a slight improvement in constipation on lactulose but it has not resolved yet.  She denies nausea and vomiting.  Her weight is stable at 104.8 pounds today.  Her usual body weight is 108 pounds.  She continues to drink 5 Ensure Plus daily but struggles with affording this product.  She is requesting nutrition supplement samples today.  Nutrition diagnosis:  Unintended weight loss has improved.   Intervention:  Patient was educated to continue Ensure Plus 5 times daily.  I educated her on diet modifications for softer, moist diet to ease swallowing difficulties. I enforced the importance of increased fluids to improve constipation.  Patient able to teach back strategies for modifying diet.  I provided samples of chocolate Ensure Plus for patient as well as coupons for her to purchase this product.  Monitoring, evaluation, and goals:  Patient continues to tolerate oral intake and oral nutritional supplements to promote slow weight gain.  Next visit:  Monday, November 11, during chemotherapy.

## 2012-03-28 ENCOUNTER — Ambulatory Visit
Admission: RE | Admit: 2012-03-28 | Discharge: 2012-03-28 | Disposition: A | Discharge status: Home / Self Care | Payer: Medicaid Other | Source: Ambulatory Visit | Attending: Radiation Oncology | Admitting: Radiation Oncology

## 2012-03-28 ENCOUNTER — Ambulatory Visit: Payer: Medicaid Other

## 2012-03-28 ENCOUNTER — Other Ambulatory Visit: Payer: Self-pay | Admitting: Radiation Oncology

## 2012-03-29 ENCOUNTER — Ambulatory Visit: Payer: Medicaid Other

## 2012-03-29 ENCOUNTER — Ambulatory Visit
Admission: RE | Admit: 2012-03-29 | Discharge: 2012-03-29 | Disposition: A | Discharge status: Home / Self Care | Payer: Medicaid Other | Source: Ambulatory Visit | Attending: Radiation Oncology | Admitting: Radiation Oncology

## 2012-03-30 ENCOUNTER — Encounter: Payer: Self-pay | Admitting: Radiation Oncology

## 2012-03-30 ENCOUNTER — Ambulatory Visit
Admission: RE | Admit: 2012-03-30 | Discharge: 2012-03-30 | Disposition: A | Discharge status: Home / Self Care | Payer: Medicaid Other | Source: Ambulatory Visit | Attending: Radiation Oncology | Admitting: Radiation Oncology

## 2012-03-30 ENCOUNTER — Ambulatory Visit: Payer: Medicaid Other

## 2012-03-30 ENCOUNTER — Telehealth: Payer: Self-pay | Admitting: Internal Medicine

## 2012-03-30 VITALS — BP 98/55 | HR 83 | Temp 98.6°F | Wt 106.8 lb

## 2012-03-30 DIAGNOSIS — C3410 Malignant neoplasm of upper lobe, unspecified bronchus or lung: Secondary | ICD-10-CM

## 2012-03-30 MED ORDER — OXYCODONE HCL 5 MG PO TABS: 5.0000 mg | ORAL_TABLET | ORAL | Status: DC | PRN

## 2012-03-30 MED ORDER — LEVOFLOXACIN 500 MG PO TABS: 500.0000 mg | ORAL_TABLET | Freq: Every day | ORAL | Status: DC

## 2012-03-30 NOTE — Progress Notes (Signed)
Routine weekly assessment of right lung radiation treatment.Completed 23 of 33.Generalized body aches. Productive yellow sputum.Mild skin discoloration and itching in treatment field.Needs refill on oxy ir.

## 2012-03-30 NOTE — Progress Notes (Signed)
  Radiation Oncology         [phone] ________________________________  Name: [name] MRN: [id number]  Date: 03/30/2012  DOB: 06-23-1950  Weekly Radiation Therapy Management  Current Dose: 46 Gy     Planned Dose:  66 Gy  Narrative . . . . . . . . The patient presents for routine under treatment assessment.                                           Routine weekly assessment of right lung radiation treatment.Completed 23 of 33.Generalized body aches. Productive yellow sputum.Mild skin discoloration and itching in treatment field.Needs refill on oxy ir.                                 Set-up films were reviewed.                                 The chart was checked. Physical Findings. . .  weight is 106 lb 12.8 oz (48.444 kg). Her temperature is 98.6 F (37 C). Her blood pressure is 98/55 and her pulse is 83. Her oxygen saturation is 100%. . Weight essentially stable.  No significant changes. Impression . . . . . . . The patient is  tolerating radiation. Plan . . . . . . . . . . . . Continue treatment as planned.  Given Levaquin and Oxy IR refill  ________________________________  Artist Pais. Kathrynn Running, M.D.

## 2012-03-30 NOTE — Telephone Encounter (Signed)
appts made and pt called with new appt time for 11/11.   L/m with this info.

## 2012-03-31 ENCOUNTER — Ambulatory Visit: Payer: Medicaid Other

## 2012-03-31 ENCOUNTER — Telehealth: Payer: Self-pay | Admitting: *Deleted

## 2012-03-31 NOTE — Telephone Encounter (Signed)
Returning [facility]   At [phone] spoke with Shawna Orleans, asking if Diflucan can be refilled by Dr.manning, patient brought the Oxy IR rx and that was filled, checked last note From Md, which said to refill Diflucan, so gave verbal telephone order to [facility] to refill x1  11:14 AM

## 2012-04-03 ENCOUNTER — Other Ambulatory Visit (HOSPITAL_BASED_OUTPATIENT_CLINIC_OR_DEPARTMENT_OTHER): Payer: Medicaid Other | Admitting: Lab

## 2012-04-03 ENCOUNTER — Ambulatory Visit: Payer: Medicaid Other

## 2012-04-03 ENCOUNTER — Ambulatory Visit (HOSPITAL_BASED_OUTPATIENT_CLINIC_OR_DEPARTMENT_OTHER): Payer: Medicaid Other | Admitting: Physician Assistant

## 2012-04-03 ENCOUNTER — Encounter: Payer: Self-pay | Admitting: Physician Assistant

## 2012-04-03 ENCOUNTER — Other Ambulatory Visit: Payer: Medicaid Other | Admitting: Lab

## 2012-04-03 ENCOUNTER — Ambulatory Visit: Payer: Medicaid Other | Admitting: Nutrition

## 2012-04-03 ENCOUNTER — Ambulatory Visit
Admission: RE | Admit: 2012-04-03 | Discharge: 2012-04-03 | Disposition: A | Discharge status: Home / Self Care | Payer: Medicaid Other | Source: Ambulatory Visit | Attending: Radiation Oncology | Admitting: Radiation Oncology

## 2012-04-03 ENCOUNTER — Telehealth: Payer: Self-pay | Admitting: Internal Medicine

## 2012-04-03 ENCOUNTER — Ambulatory Visit (HOSPITAL_BASED_OUTPATIENT_CLINIC_OR_DEPARTMENT_OTHER): Payer: Medicaid Other

## 2012-04-03 VITALS — BP 96/66 | HR 79 | Temp 97.3°F | Resp 20 | Ht 67.0 in | Wt 102.5 lb

## 2012-04-03 DIAGNOSIS — Z5111 Encounter for antineoplastic chemotherapy: Secondary | ICD-10-CM

## 2012-04-03 DIAGNOSIS — C3490 Malignant neoplasm of unspecified part of unspecified bronchus or lung: Secondary | ICD-10-CM

## 2012-04-03 DIAGNOSIS — C3410 Malignant neoplasm of upper lobe, unspecified bronchus or lung: Secondary | ICD-10-CM

## 2012-04-03 LAB — COMPREHENSIVE METABOLIC PANEL (CC13)
CO2: 26 mEq/L (ref 22–29)
Creatinine: 0.7 mg/dL (ref 0.6–1.1)
Glucose: 122 mg/dl — ABNORMAL HIGH (ref 70–99)
Total Bilirubin: 0.53 mg/dL (ref 0.20–1.20)

## 2012-04-03 LAB — CBC WITH DIFFERENTIAL/PLATELET
Basophils Absolute: 0 10*3/uL (ref 0.0–0.1)
EOS%: 0.6 % (ref 0.0–7.0)
Eosinophils Absolute: 0 10*3/uL (ref 0.0–0.5)
LYMPH%: 13.4 % — ABNORMAL LOW (ref 14.0–49.7)
MCH: 33.8 pg (ref 25.1–34.0)
MCV: 97.4 fL (ref 79.5–101.0)
MONO%: 12.8 % (ref 0.0–14.0)
NEUT#: 2.6 10*3/uL (ref 1.5–6.5)
Platelets: 189 10*3/uL (ref 145–400)
RBC: 3.52 10*6/uL — ABNORMAL LOW (ref 3.70–5.45)
nRBC: 0 % (ref 0–0)

## 2012-04-03 MED ORDER — SODIUM CHLORIDE 0.9 % IV SOLN: Freq: Once | INTRAVENOUS | Status: DC

## 2012-04-03 MED ORDER — FAMOTIDINE IN NACL 20-0.9 MG/50ML-% IV SOLN
20.0000 mg | Freq: Once | INTRAVENOUS | Status: AC
  Administered 2012-04-03: 20 mg via INTRAVENOUS

## 2012-04-03 MED ORDER — DIPHENHYDRAMINE HCL 50 MG/ML IJ SOLN
50.0000 mg | Freq: Once | INTRAMUSCULAR | Status: AC
  Administered 2012-04-03: 50 mg via INTRAVENOUS

## 2012-04-03 MED ORDER — ONDANSETRON 16 MG/50ML IVPB (CHCC)
16.0000 mg | Freq: Once | INTRAVENOUS | Status: AC
  Administered 2012-04-03: 16 mg via INTRAVENOUS

## 2012-04-03 MED ORDER — CARBOPLATIN CHEMO INTRADERMAL TEST DOSE 100MCG/0.02ML
100.0000 ug | Freq: Once | INTRADERMAL | Status: AC
  Administered 2012-04-03: 100 ug via INTRADERMAL
  Filled 2012-04-03: qty 0.02

## 2012-04-03 MED ORDER — SODIUM CHLORIDE 0.9 % IV SOLN
194.8000 mg | Freq: Once | INTRAVENOUS | Status: AC
  Administered 2012-04-03: 190 mg via INTRAVENOUS
  Filled 2012-04-03: qty 19

## 2012-04-03 MED ORDER — DEXAMETHASONE SODIUM PHOSPHATE 4 MG/ML IJ SOLN
20.0000 mg | Freq: Once | INTRAMUSCULAR | Status: AC
  Administered 2012-04-03: 20 mg via INTRAVENOUS

## 2012-04-03 NOTE — Patient Instructions (Addendum)
[facility] Discharge Instructions for Patients Receiving Chemotherapy  Today you received the following chemotherapy agents Carboplatin  To help prevent nausea and vomiting after your treatment, we encourage you to take your nausea medication  As prescribed.}   If you develop nausea and vomiting that is not controlled by your nausea medication, call the clinic. If it is after clinic hours your family physician or the after hours number for the clinic or go to the Emergency Department.   BELOW ARE SYMPTOMS THAT SHOULD BE REPORTED IMMEDIATELY:  *FEVER GREATER THAN 100.5 F  *CHILLS WITH OR WITHOUT FEVER  NAUSEA AND VOMITING THAT IS NOT CONTROLLED WITH YOUR NAUSEA MEDICATION  *UNUSUAL SHORTNESS OF BREATH  *UNUSUAL BRUISING OR BLEEDING  TENDERNESS IN MOUTH AND THROAT WITH OR WITHOUT PRESENCE OF ULCERS  *URINARY PROBLEMS  *BOWEL PROBLEMS  UNUSUAL RASH Items with * indicate a potential emergency and should be followed up as soon as possible.  One of the nurses will contact you 24 hours after your treatment. Please let the nurse know about any problems that you may have experienced. Feel free to call the clinic you have any questions or concerns. The clinic phone number is [phone].   I have been informed and understand all the instructions given to me. I know to contact the clinic, my physician, or go to the Emergency Department if any problems should occur. I do not have any questions at this time, but understand that I may call the clinic during office hours   should I have any questions or need assistance in obtaining follow up care.    __________________________________________  _____________  __________ Signature of Patient or Authorized Representative            Date                   Time    __________________________________________ Nurse's Signature

## 2012-04-03 NOTE — Progress Notes (Signed)
Carbo test dose placed on right arm at 1249. Site read at 1254, 1314 and 1319. All readings negative.

## 2012-04-03 NOTE — Progress Notes (Signed)
[facility] [phone]   [phone]  OFFICE PROGRESS NOTE  [name], MD [address] [facility], [place]. [place] [address]  DIAGNOSIS: Stage IIIa (T2a, N2, MX) non-small cell lung cancer, squamous cell carcinoma diagnosed in September of 2013.  PRIOR THERAPY: None.  CURRENT THERAPY: Concurrent chemoradiation with weekly carboplatin for AUC of 2 and paclitaxel 45 mg/M2. Paclitaxel discontinued after cycle #5 secondary to peripheral neuropathy  INTERVAL HISTORY: [name] 61 y.o. female returns to the clinic today for followup visit. She presents for a followup appointment. Last week she had a questionable reaction to her carboplatin. It was not clear whether this was related solely to the IV insertion versus a reaction to the drug itself. She presents to proceed with her next scheduled cycle of chemotherapy however only with carboplatin as the paclitaxel was discontinued secondary to peripheral neuropathy. She reports that Dr. Kathrynn Running placed her on a course of antibiotics for productive cough. She states that started on Friday, 03/31/2012 she began coughing up some bright red blood mixed with some dark red blood in mucous 3 - 4 times throughout the day. She estimates the total amount per day to be approximately a teaspoon. She notes increased problems swallowing and is made radiation oncology aware of these issues. She denied any fever or chills.  She denied having any significantnausea or vomiting.  The patient denied having any significant chest pain, shortness breath. She also denied having any significant weight loss and she continues to eat well but is not tgaining much weight.   MEDICAL HISTORY: Past Medical History  Diagnosis Date  . Acute myocardial infarction, unspecified site, episode of care unspecified   . Esophageal dysmotility   . Unspecified hemorrhoids without mention of complication   . Hemorrhoids     . Esophageal stricture   . Diaphragmatic hernia without mention of obstruction or gangrene   . 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 Coronary atherosclerosis of unspecified type of vessel, native or graft   . Peripheral neuropathy   . Other and unspecified hyperlipidemia   . Peptic ulcer, unspecified site, unspecified as acute or chronic, without mention of hemorrhage, perforation, or obstruction   . Unspecified cardiovascular disease   . Depressive disorder, not elsewhere classified   . Anxiety state, unspecified   . COPD (chronic obstructive pulmonary disease)   . GERD (gastroesophageal reflux disease)   . Candida esophagitis     on multiple occasions  . C. difficile diarrhea   . PVD (peripheral vascular disease)   . Headache   . Weight loss 06/13/2011    scheduled here by family doctor  . CHF (congestive heart failure) 2011  . Asthma   . Hypertension     ALLERGIES:  is allergic to amiodarone and clopidogrel bisulfate.  MEDICATIONS:  Current Outpatient Prescriptions  Medication Sig Dispense Refill  . acyclovir ointment (ZOVIRAX) 5 % Apply topically every 3 (three) hours. Apply topically to lip areas every 3 hours up to 7 days as needed.  30 g  0  . albuterol (PROVENTIL HFA;VENTOLIN HFA) 108 (90 BASE) MCG/ACT inhaler Inhale 2 puffs into the lungs every 6 (six) hours as needed. For wheezing      . Alum & Mag Hydroxide-Simeth (MAGIC MOUTHWASH) SOLN Take 5 mLs by mouth 4 (four) times daily as needed.  240 mL  0  . aspirin 81 MG EC tablet Take 81 mg by mouth daily.        Marland Kitchen  budesonide-formoterol (SYMBICORT) 160-4.5 MCG/ACT inhaler Inhale 2 puffs into the lungs 2 (two) times daily.      . Cholecalciferol (VITAMIN D-3) 5000 UNITS TABS Take 1 tablet by mouth daily.      Marland Kitchen dextromethorphan-guaiFENesin (MUCINEX DM) 30-600 MG per 12 hr tablet Take 1 tablet by mouth every 12 (twelve) hours as needed. For cough      . ezetimibe (ZETIA) 10 MG tablet Take 10 mg by mouth daily.         . famotidine (PEPCID) 20 MG tablet Take 20 mg by mouth at bedtime.      . fluconazole (DIFLUCAN) 100 MG tablet TAKE 2 TABLETS ON DAY 1, THEN TAKE 1 TABLET DAILY THEREAFTER  15 tablet  0  . fluticasone (FLONASE) 50 MCG/ACT nasal spray Place 2 sprays into the nose 2 (two) times daily.        Marland Kitchen lactulose (CHRONULAC) 10 GM/15ML solution Take 30 mLs (20 g total) by mouth 3 (three) times daily.  240 mL  0  . levofloxacin (LEVAQUIN) 500 MG tablet Take 1 tablet (500 mg total) by mouth daily.  10 tablet  0  . montelukast (SINGULAIR) 10 MG tablet Take 10 mg by mouth at bedtime.        . nebivolol (BYSTOLIC) 2.5 MG tablet Take 1 tablet (2.5 mg total) by mouth daily.  30 tablet  0  . nitroGLYCERIN (NITROSTAT) 0.4 MG SL tablet Place 0.4 mg under the tongue every 5 (five) minutes as needed. For chest pain      . nystatin (MYCOSTATIN) 100000 UNIT/ML suspension SWISH AND SPIT OR SWALLOW 5 ML BY MOUTH 4 TIMES A DAY AS NEEDED  240 mL  2  . omeprazole (PRILOSEC) 20 MG capsule Take 20 mg by mouth 2 (two) times daily.        Marland Kitchen oxyCODONE (OXY IR/ROXICODONE) 5 MG immediate release tablet Take 1-3 tablets (5-15 mg total) by mouth every 4 (four) hours as needed for pain.  120 tablet  0  . prasugrel (EFFIENT) 10 MG TABS Take 10 mg by mouth daily.       . prochlorperazine (COMPAZINE) 10 MG tablet Take 10 mg by mouth every 6 (six) hours as needed.      . sucralfate (CARAFATE) 1 GM/10ML suspension Take 10 mLs (1 g total) by mouth 4 (four) times daily.  840 mL  0  . tiotropium (SPIRIVA) 18 MCG inhalation capsule Place 18 mcg into inhaler and inhale daily.       No current facility-administered medications for this visit.   Facility-Administered Medications Ordered in Other Visits  Medication Dose Route Frequency Provider Last Rate Last Dose  . 0.9 %  sodium chloride infusion   Intravenous Once Si Gaul, MD      . [COMPLETED] CARBOplatin (PARAPLATIN) 190 mg in sodium chloride 0.9 % 100 mL chemo infusion  190 mg  Intravenous Once Si Gaul, MD   190 mg at 04/03/12 1426  . [COMPLETED] CARBOplatin CHEMO intradermal Test Dose 100 mcg/0.12ml  100 mcg Intradermal Once Conni Slipper, PA   100 mcg at 04/03/12 1249  . [COMPLETED] dexamethasone (DECADRON) injection 20 mg  20 mg Intravenous Once Si Gaul, MD   20 mg at 04/03/12 1337  . [COMPLETED] diphenhydrAMINE (BENADRYL) injection 50 mg  50 mg Intravenous Once Si Gaul, MD   50 mg at 04/03/12 1337  . [COMPLETED] famotidine (PEPCID) IVPB 20 mg  20 mg Intravenous Once Si Gaul, MD   20 mg at 04/03/12  1358  . [COMPLETED] ondansetron (ZOFRAN) IVPB 16 mg  16 mg Intravenous Once Si Gaul, MD   16 mg at 04/03/12 1336    SURGICAL HISTORY:  Past Surgical History  Procedure Date  . Tonsillectomy 1974  . Fracture lt jaw 1976  . Breast surgery 1969, 1970    bilateral  . Epigastric hernia repair 2008    Dr Dorris Fetch  . Coronary artery bypass graft 1995  . Carpal tunnel release   . Total abdominal hysterectomy 1998  . Laminectomy   . Coronary angioplasty     multiple  . Subclavian vein angioplasty / stenting     REVIEW OF SYSTEMS:  A comprehensive review of systems was negative except for: Respiratory: positive for cough, hemoptysis and Small amounts as described above   PHYSICAL EXAMINATION: General appearance: alert, cooperative and no distress Head: Normocephalic, without obvious abnormality, atraumatic Neck: no adenopathy Lymph nodes: Cervical, supraclavicular, and axillary nodes normal. Resp: clear to auscultation bilaterally Cardio: regular rate and rhythm, S1, S2 normal, no murmur, click, rub or gallop GI: soft, non-tender; bowel sounds normal; no masses,  no organomegaly Extremities: extremities normal, atraumatic, no cyanosis or edema Neurologic: Alert and oriented X 3, normal strength and tone. Normal symmetric reflexes. Normal coordination and gait   ECOG PERFORMANCE STATUS: 1 - Symptomatic but completely  ambulatory  Blood pressure 96/66, pulse 79, temperature 97.3 F (36.3 C), temperature source Oral, resp. rate 20, height 5\' 7"  (1.702 m), weight 102 lb 8 oz (46.494 kg).  LABORATORY DATA: Lab Results  Component Value Date   WBC 3.6* 04/03/2012   HGB 11.9 04/03/2012   HCT 34.3* 04/03/2012   MCV 97.4 04/03/2012   PLT 189 04/03/2012      Chemistry      Component Value Date/Time   NA 136 04/03/2012 1117   NA 139 02/07/2012 1227   K 3.8 04/03/2012 1117   K 4.2 02/07/2012 1227   CL 103 04/03/2012 1117   CL 101 02/07/2012 1227   CO2 26 04/03/2012 1117   CO2 29 02/07/2012 1227   BUN 8.0 04/03/2012 1117   BUN 9 02/07/2012 1227   CREATININE 0.7 04/03/2012 1117   CREATININE 0.50 02/07/2012 1227      Component Value Date/Time   CALCIUM 9.7 04/03/2012 1117   CALCIUM 10.4 02/07/2012 1227   ALKPHOS 103 04/03/2012 1117   ALKPHOS 106 02/07/2012 1227   AST 17 04/03/2012 1117   AST 18 02/07/2012 1227   ALT 21 04/03/2012 1117   ALT 13 02/07/2012 1227   BILITOT 0.53 04/03/2012 1117   BILITOT 0.5 02/07/2012 1227       RADIOGRAPHIC STUDIES: Nm Pet Image Initial (pi) Skull Base To Thigh  02/15/2012  *RADIOLOGY REPORT*  Clinical Data: Initial treatment strategy for new lung mass.  NUCLEAR MEDICINE PET SKULL BASE TO THIGH  Fasting Blood Glucose:  103  Technique:  17.9 mCi F-18 FDG was injected intravenously. CT data was obtained and used for attenuation correction and anatomic localization only.  (This was not acquired as a diagnostic CT examination.) Additional exam technical data entered on technologist worksheet.  Comparison:  Chest radiographs dated 02/07/2012  Findings:  Neck: No hypermetabolic lymph nodes in the neck.  Chest:  2.9 x 3.7 cm spiculated posterior right upper lobe mass (series 2/image 85), max SUV 11.8, compatible with primary bronchogenic neoplasm.  1.3 cm short axis right prevascular node (series 2/image 73), max SUV 6.6.  1.2 cm short-axis precarinal node (series 2/image 86), max SUV  6.1.  Coronary atherosclerosis.  Postsurgical changes related to prior CABG.  Abdomen/Pelvis:  No abnormal hypermetabolic activity within the liver, pancreas, adrenal glands, or spleen.  No hypermetabolic lymph nodes in the abdomen or pelvis.  Atherosclerotic calcifications of the abdominal aorta and branch vessels.  Skeleton:  No focal hypermetabolic activity to suggest skeletal metastasis.  Prior thoracolumbar fixation.  IMPRESSION: 2.9 x 3.7 cm spiculated posterior right upper lobe mass, max SUV 11.8, compatible with primary bronchogenic neoplasm.  Prevascular and precarinal nodal metastases, as described above, max SUV 6.1.  No evidence of metastatic disease in the neck, abdomen, or pelvis.   Original Report Authenticated By: Charline Bills, M.D.     ASSESSMENT/PLAN: This is a very pleasant 61 years old white female with recently diagnosed as stage IIIa non-small cell lung cancer currently undergoing concurrent chemoradiation with weekly carboplatin and paclitaxel is status post 6 cycles. The patient was discussed with Dr. Arbutus Ped. As previously stated the paclitaxel was discontinued secondary to complaints of peripheral neuropathy. For increased safety the patient will have a carbo test dose prior to her subsequent cycles of weekly chemotherapy with single agent carboplatin. Should she have a true reaction this chemotherapy will also be discontinued. We'll monitor her very closely. She'll followup in 2 weeks with a repeat CBC differential and C. met and another symptom management visit. She is encouraged to keep [place] abreast of her hemoptysis and should it increase/persist she is to seek further evaluation. Patient voiced understanding.   Whitney Stankus E, PA-C .  All questions were answered. The patient knows to call the clinic with any problems, questions or concerns. We can certainly see the patient much sooner if necessary.  I spent 20 minutes counseling the patient face to face. The total time  spent in the appointment was 30 minutes.

## 2012-04-03 NOTE — Telephone Encounter (Signed)
Gave pt appt for November 26th per ML with labs

## 2012-04-03 NOTE — Progress Notes (Signed)
I spoke to patient in chemotherapy. She reports painful and difficult swallowing. She states she has been coughing up blood. She states reminiscing and will be evaluating the cause of this. She thinks constipation has improved. She is attempting Ensure Plus 5 times daily however has been unable to tolerate Ensure Plus the past 3 days. Her weight has declined to 102.5 pounds today from 106.8 pounds November 7.  Nutrition diagnosis: Unintended weight loss continues.  Intervention: I have encouraged patient to increase Ensure Plus as tolerated. I've encouraged soft moist foods that will be easy to swallow. I've encouraged increased fluids.  Monitoring, evaluation, and goals: Patient has been unable to increase oral intake and has lost weight since last visit.  Next visit: Monday, November 18, during chemotherapy.

## 2012-04-03 NOTE — Patient Instructions (Addendum)
Continue your course of concurrent chemoradiation as scheduled. If you or hemoptysis (coughing up of blood) persist or worsen your to seek further evaluation with our office or the nearest emergency room. Followup in 2 weeks with another CBC differential and C. met for a symptom management visit

## 2012-04-04 ENCOUNTER — Ambulatory Visit: Payer: Medicaid Other

## 2012-04-04 ENCOUNTER — Encounter: Payer: Self-pay | Admitting: Radiation Oncology

## 2012-04-04 ENCOUNTER — Ambulatory Visit
Admission: RE | Admit: 2012-04-04 | Discharge: 2012-04-04 | Disposition: A | Discharge status: Home / Self Care | Payer: Medicaid Other | Source: Ambulatory Visit | Attending: Radiation Oncology | Admitting: Radiation Oncology

## 2012-04-04 VITALS — BP 120/67 | HR 93 | Temp 98.0°F | Resp 20 | Wt 103.2 lb

## 2012-04-04 DIAGNOSIS — C3410 Malignant neoplasm of upper lobe, unspecified bronchus or lung: Secondary | ICD-10-CM

## 2012-04-04 NOTE — Progress Notes (Signed)
Weekly Management Note Current Dose:50 Gy  Projected Dose:66 Gy   Narrative:  The patient presents for routine under treatment assessment.  CBCT/MVCT images/Port film x-rays were reviewed.  The chart was checked. Patient continues on levaquin.  She coughs up sputum streaked with blood especially at night. Also had IV infiltration of IV chemo. Cough, pain ,headache fatigue continue  Physical Findings:  IV infiltration sites. Appears stable. Coughs occasionally but no respiratory distress.  Vitals:  Filed Vitals:   04/04/12 1033  BP: 120/67  Pulse: 93  Temp: 98 F (36.7 C)  Resp: 20   Weight:  Wt Readings from Last 3 Encounters:  04/04/12 103 lb 3.2 oz (46.811 kg)  04/03/12 102 lb 8 oz (46.494 kg)  03/30/12 106 lb 12.8 oz (48.444 kg)   Lab Results  Component Value Date   WBC 3.6* 04/03/2012   HGB 11.9 04/03/2012   HCT 34.3* 04/03/2012   MCV 97.4 04/03/2012   PLT 189 04/03/2012   Lab Results  Component Value Date   CREATININE 0.7 04/03/2012   BUN 8.0 04/03/2012   NA 136 04/03/2012   K 3.8 04/03/2012   CL 103 04/03/2012   CO2 26 04/03/2012     Impression:  The patient is tolerating radiation.  Plan:  Continue treatment as planned. Hgb is stable from yesterday. Will recheck Thursday. Patient is holding her aspirin. Likely due to RT/chemo but will monitor closely.

## 2012-04-04 NOTE — Progress Notes (Addendum)
Pt requests to be seen by Dr Michell Heinrich. She has received  25/33 tx to right lung. She reports beginning last Fri she began coughing up "bright red blood in yellow sputum, about a tablespoon each time". She states it "is worse during the night". Pt on Levaquin since 03/30/12 per Dr Kathrynn Running. O2 sat on room air 100% today.  Pt has chronic all over pain, HA. Reports fatigue; she did not come for radiation tx 03/31/12 due to extreme fatigue. Pt drinking Ensure 5 cans/daily but states she "forces herself to drink them". C/o esophagitis; Carafate somewhat helpful.

## 2012-04-05 ENCOUNTER — Other Ambulatory Visit: Payer: Self-pay | Admitting: Physician Assistant

## 2012-04-05 ENCOUNTER — Ambulatory Visit
Admission: RE | Admit: 2012-04-05 | Discharge: 2012-04-05 | Disposition: A | Discharge status: Home / Self Care | Payer: Medicaid Other | Source: Ambulatory Visit | Attending: Radiation Oncology | Admitting: Radiation Oncology

## 2012-04-05 ENCOUNTER — Ambulatory Visit: Payer: Medicaid Other

## 2012-04-06 ENCOUNTER — Telehealth: Payer: Self-pay | Admitting: *Deleted

## 2012-04-06 ENCOUNTER — Ambulatory Visit: Payer: Medicaid Other

## 2012-04-06 ENCOUNTER — Ambulatory Visit
Admission: RE | Admit: 2012-04-06 | Discharge: 2012-04-06 | Disposition: A | Discharge status: Home / Self Care | Payer: Medicaid Other | Source: Ambulatory Visit | Attending: Radiation Oncology | Admitting: Radiation Oncology

## 2012-04-06 ENCOUNTER — Telehealth: Payer: Self-pay | Admitting: Radiation Oncology

## 2012-04-06 DIAGNOSIS — C3410 Malignant neoplasm of upper lobe, unspecified bronchus or lung: Secondary | ICD-10-CM

## 2012-04-06 DIAGNOSIS — C3490 Malignant neoplasm of unspecified part of unspecified bronchus or lung: Secondary | ICD-10-CM | POA: Insufficient documentation

## 2012-04-06 LAB — CBC WITH DIFFERENTIAL/PLATELET
Basophils Absolute: 0 10*3/uL (ref 0.0–0.1)
EOS%: 0.4 % (ref 0.0–7.0)
HCT: 33 % — ABNORMAL LOW (ref 34.8–46.6)
HGB: 11.2 g/dL — ABNORMAL LOW (ref 11.6–15.9)
LYMPH%: 10.5 % — ABNORMAL LOW (ref 14.0–49.7)
MCH: 33.7 pg (ref 25.1–34.0)
MCHC: 33.9 g/dL (ref 31.5–36.0)
MCV: 99.4 fL (ref 79.5–101.0)
MONO%: 13.7 % (ref 0.0–14.0)
NEUT%: 75.2 % (ref 38.4–76.8)
Platelets: 249 10*3/uL (ref 145–400)
lymph#: 0.5 10*3/uL — ABNORMAL LOW (ref 0.9–3.3)

## 2012-04-06 NOTE — Telephone Encounter (Signed)
Per Dr Michell Heinrich, called pt and informed her Hgb stable per labs drawn this morning. She is to monitor her cough and hemoptysis. If more than a few tablespoons, pt instructed to call or go to ER. Pt will see Dr Kathrynn Running tomorrow after radiation tx for put visit. Pt verbalized understanding.

## 2012-04-06 NOTE — Telephone Encounter (Signed)
Received call from Tera, pharmacist, at [facility] reference concerns about this patient. Tera explains that this patient presented today attempting to fill 03/01/2012 prescription written by Dr. Kathrynn Running for Vicodin 5/300 qty 120. She goes on to explain she "ran the patient in the data base" which revealed she has already received 180 tablets of Norco from Dr. Letta Kocher of the spine clinic. Tera reports the patient was flagged because she normally fills Dr. Penny Pia script at [facility]. She explains the patient has been filling 180 tablet Norco script each month from Dr. Retia Passe since June. Tera requested Dr. Kathrynn Running be informed this script for 03/01/2012 was not filled and has been cancelled. Routed this message to Colen Darling, RN and Dr. Margaretmary Dys.

## 2012-04-07 ENCOUNTER — Ambulatory Visit
Admission: RE | Admit: 2012-04-07 | Discharge: 2012-04-07 | Disposition: A | Discharge status: Home / Self Care | Payer: Medicaid Other | Source: Ambulatory Visit | Attending: Radiation Oncology | Admitting: Radiation Oncology

## 2012-04-07 ENCOUNTER — Ambulatory Visit: Payer: Medicaid Other

## 2012-04-07 ENCOUNTER — Encounter: Payer: Self-pay | Admitting: Radiation Oncology

## 2012-04-07 VITALS — BP 108/67 | HR 94 | Temp 98.1°F | Resp 20 | Wt 104.7 lb

## 2012-04-07 DIAGNOSIS — C3410 Malignant neoplasm of upper lobe, unspecified bronchus or lung: Secondary | ICD-10-CM

## 2012-04-07 NOTE — Progress Notes (Signed)
  Radiation Oncology         [phone] ________________________________  Name: [name] MRN: [id number]  Date: 04/07/2012  DOB: 1950/11/09  Weekly Radiation Therapy Management  Current Dose: 56 Gy     Planned Dose:  66 Gy  Narrative . . . . . . . . The patient presents for routine under treatment assessment.                                              Pt reports she "is still coughing up blood, about 1 TBSP daily, no increase in amount". No increase in SOB. Remains fatigued, c/o back, leg pain and headache today. Pt states her throat hurts, but she is able to drink 5 Ensure, Boost daily                                  Set-up films were reviewed.                                 The chart was checked. Physical Findings. . .  weight is 104 lb 11.2 oz (47.492 kg). Her oral temperature is 98.1 F (36.7 C). Her blood pressure is 108/67 and her pulse is 94. Her respiration is 20 and oxygen saturation is 97%. . Weight essentially stable.  No significant changes. Impression . . . . . . . The patient is  tolerating radiation. Plan . . . . . . . . . . . . Continue treatment as planned.  ________________________________  [name]. Kathrynn Running, M.D.

## 2012-04-07 NOTE — Progress Notes (Addendum)
Pt reports she "is still coughing up blood, about 1 TBSP daily, no increase in amount". No increase in SOB. Remains fatigued, c/o back, leg pain and headache today. Pt states her throat hurts, but she is able to drink 5 Ensure, Boost daily.

## 2012-04-10 ENCOUNTER — Ambulatory Visit: Payer: Medicaid Other | Admitting: Nutrition

## 2012-04-10 ENCOUNTER — Other Ambulatory Visit: Payer: Self-pay | Admitting: Internal Medicine

## 2012-04-10 ENCOUNTER — Ambulatory Visit (HOSPITAL_BASED_OUTPATIENT_CLINIC_OR_DEPARTMENT_OTHER): Payer: Medicaid Other | Admitting: Internal Medicine

## 2012-04-10 ENCOUNTER — Ambulatory Visit: Payer: Medicaid Other

## 2012-04-10 ENCOUNTER — Ambulatory Visit
Admission: RE | Admit: 2012-04-10 | Discharge: 2012-04-10 | Disposition: A | Discharge status: Home / Self Care | Payer: Medicaid Other | Source: Ambulatory Visit | Attending: Radiation Oncology | Admitting: Radiation Oncology

## 2012-04-10 ENCOUNTER — Other Ambulatory Visit (HOSPITAL_BASED_OUTPATIENT_CLINIC_OR_DEPARTMENT_OTHER): Payer: Medicaid Other | Admitting: Lab

## 2012-04-10 ENCOUNTER — Ambulatory Visit (HOSPITAL_BASED_OUTPATIENT_CLINIC_OR_DEPARTMENT_OTHER): Payer: Medicaid Other

## 2012-04-10 ENCOUNTER — Telehealth: Payer: Self-pay | Admitting: Internal Medicine

## 2012-04-10 VITALS — BP 106/58 | HR 90 | Temp 97.8°F | Resp 18 | Ht 67.0 in | Wt 103.4 lb

## 2012-04-10 DIAGNOSIS — Z5111 Encounter for antineoplastic chemotherapy: Secondary | ICD-10-CM

## 2012-04-10 DIAGNOSIS — C3410 Malignant neoplasm of upper lobe, unspecified bronchus or lung: Secondary | ICD-10-CM

## 2012-04-10 LAB — CBC WITH DIFFERENTIAL/PLATELET
EOS%: 0.6 % (ref 0.0–7.0)
Eosinophils Absolute: 0 10*3/uL (ref 0.0–0.5)
LYMPH%: 18.5 % (ref 14.0–49.7)
MCH: 34.3 pg — ABNORMAL HIGH (ref 25.1–34.0)
MCV: 98.5 fL (ref 79.5–101.0)
MONO%: 20.1 % — ABNORMAL HIGH (ref 0.0–14.0)
NEUT#: 1.9 10*3/uL (ref 1.5–6.5)
Platelets: 241 10*3/uL (ref 145–400)
RBC: 3.41 10*6/uL — ABNORMAL LOW (ref 3.70–5.45)
nRBC: 0 % (ref 0–0)

## 2012-04-10 MED ORDER — CARBOPLATIN CHEMO INTRADERMAL TEST DOSE 100MCG/0.02ML
100.0000 ug | Freq: Once | INTRADERMAL | Status: AC
  Administered 2012-04-10: 100 ug via INTRADERMAL
  Filled 2012-04-10: qty 0.01

## 2012-04-10 MED ORDER — SODIUM CHLORIDE 0.9 % IV SOLN
Freq: Once | INTRAVENOUS | Status: AC
  Administered 2012-04-10: 20 mL via INTRAVENOUS

## 2012-04-10 MED ORDER — ONDANSETRON 16 MG/50ML IVPB (CHCC)
16.0000 mg | Freq: Once | INTRAVENOUS | Status: AC
  Administered 2012-04-10: 16 mg via INTRAVENOUS

## 2012-04-10 MED ORDER — SODIUM CHLORIDE 0.9 % IJ SOLN
10.0000 mL | INTRAMUSCULAR | Status: DC | PRN
  Filled 2012-04-10: qty 10

## 2012-04-10 MED ORDER — DEXAMETHASONE SODIUM PHOSPHATE 4 MG/ML IJ SOLN
20.0000 mg | Freq: Once | INTRAMUSCULAR | Status: AC
  Administered 2012-04-10: 20 mg via INTRAVENOUS

## 2012-04-10 MED ORDER — SODIUM CHLORIDE 0.9 % IV SOLN
174.0000 mg | Freq: Once | INTRAVENOUS | Status: AC
  Administered 2012-04-10: 170 mg via INTRAVENOUS
  Filled 2012-04-10: qty 17

## 2012-04-10 MED ORDER — HEPARIN SOD (PORK) LOCK FLUSH 100 UNIT/ML IV SOLN
500.0000 [IU] | Freq: Once | INTRAVENOUS | Status: DC | PRN
  Filled 2012-04-10: qty 5

## 2012-04-10 NOTE — Progress Notes (Signed)
[facility] Telephone:(336) [phone]   Fax:(336) [phone]  OFFICE PROGRESS NOTE  [name], MD [address] [facility] [place], [place]. [place] [address]  DIAGNOSIS: Stage IIIa (T2a, N2, MX) non-small cell lung cancer, squamous cell carcinoma diagnosed in September of 2013.   PRIOR THERAPY: None.   CURRENT THERAPY: Concurrent chemoradiation with weekly carboplatin for AUC of 2 and paclitaxel 45 mg/M2. Paclitaxel discontinued after cycle #5 secondary to peripheral neuropathy  INTERVAL HISTORY: [name] 61 y.o. female returns to the clinic today for routine followup visit. The patient is tolerating her treatment with concurrent chemoradiation fairly well except for radiation induced esophagitis with some odynophagia. She is currently on Carafate and pain medication. She denied having any significant chest pain but continues to have shortness breath with exertion and mild cough with occasional blood tinged sputum production, but no frank hemoptysis. Her weight has been stable over the last few weeks. She denied having any significant night sweats. She is here today for the last cycle of her concurrent chemoradiation.  MEDICAL HISTORY: Past Medical History  Diagnosis Date  . Acute myocardial infarction, unspecified site, episode of care unspecified   . Esophageal dysmotility   . Unspecified hemorrhoids without mention of complication   . Hemorrhoids   . Esophageal stricture   . Diaphragmatic hernia without mention of obstruction or gangrene   . 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 Coronary atherosclerosis of unspecified type of vessel, native or graft   . Peripheral neuropathy   . Other and unspecified hyperlipidemia   . Peptic ulcer, unspecified site, unspecified as acute or chronic, without mention of hemorrhage, perforation, or obstruction   . Unspecified cardiovascular disease   . Depressive disorder, not elsewhere  classified   . Anxiety state, unspecified   . COPD (chronic obstructive pulmonary disease)   . GERD (gastroesophageal reflux disease)   . Candida esophagitis     on multiple occasions  . C. difficile diarrhea   . PVD (peripheral vascular disease)   . Headache   . Weight loss 06/13/2011    scheduled here by family doctor  . CHF (congestive heart failure) 2011  . Asthma   . Hypertension     ALLERGIES:  is allergic to amiodarone and clopidogrel bisulfate.  MEDICATIONS:  Current Outpatient Prescriptions  Medication Sig Dispense Refill  . acyclovir ointment (ZOVIRAX) 5 % Apply topically every 3 (three) hours. Apply topically to lip areas every 3 hours up to 7 days as needed.  30 g  0  . albuterol (PROVENTIL HFA;VENTOLIN HFA) 108 (90 BASE) MCG/ACT inhaler Inhale 2 puffs into the lungs every 6 (six) hours as needed. For wheezing      . Alum & Mag Hydroxide-Simeth (MAGIC MOUTHWASH) SOLN Take 5 mLs by mouth 4 (four) times daily as needed.  240 mL  0  . aspirin 81 MG EC tablet Take 81 mg by mouth daily.        . budesonide-formoterol (SYMBICORT) 160-4.5 MCG/ACT inhaler Inhale 2 puffs into the lungs 2 (two) times daily.      . Cholecalciferol (VITAMIN D-3) 5000 UNITS TABS Take 1 tablet by mouth daily.      Marland Kitchen dextromethorphan-guaiFENesin (MUCINEX DM) 30-600 MG per 12 hr tablet Take 1 tablet by mouth every 12 (twelve) hours as needed. For cough      . ezetimibe (ZETIA) 10 MG tablet Take 10 mg by mouth daily.        Marland Kitchen  famotidine (PEPCID) 20 MG tablet Take 20 mg by mouth at bedtime.      . fluticasone (FLONASE) 50 MCG/ACT nasal spray Place 2 sprays into the nose 2 (two) times daily.        Marland Kitchen lactulose (CHRONULAC) 10 GM/15ML solution TAKE 30 ML BY MOUTH 3 TIMES A DAY  240 mL  0  . montelukast (SINGULAIR) 10 MG tablet Take 10 mg by mouth at bedtime.        . nebivolol (BYSTOLIC) 2.5 MG tablet Take 1 tablet (2.5 mg total) by mouth daily.  30 tablet  0  . nystatin (MYCOSTATIN) 100000 UNIT/ML suspension  SWISH AND SPIT OR SWALLOW 5 ML BY MOUTH 4 TIMES A DAY AS NEEDED  240 mL  2  . omeprazole (PRILOSEC) 20 MG capsule Take 20 mg by mouth 2 (two) times daily.        Marland Kitchen oxyCODONE (OXY IR/ROXICODONE) 5 MG immediate release tablet Take 1-3 tablets (5-15 mg total) by mouth every 4 (four) hours as needed for pain.  120 tablet  0  . prasugrel (EFFIENT) 10 MG TABS Take 10 mg by mouth daily.       . prochlorperazine (COMPAZINE) 10 MG tablet Take 10 mg by mouth every 6 (six) hours as needed.      . sucralfate (CARAFATE) 1 GM/10ML suspension Take 10 mLs (1 g total) by mouth 4 (four) times daily.  840 mL  0  . tiotropium (SPIRIVA) 18 MCG inhalation capsule Place 18 mcg into inhaler and inhale daily.      . nitroGLYCERIN (NITROSTAT) 0.4 MG SL tablet Place 0.4 mg under the tongue every 5 (five) minutes as needed. For chest pain        SURGICAL HISTORY:  Past Surgical History  Procedure Date  . Tonsillectomy 1974  . Fracture lt jaw 1976  . Breast surgery 1969, 1970    bilateral  . Epigastric hernia repair 2008    Dr Dorris Fetch  . Coronary artery bypass graft 1995  . Carpal tunnel release   . Total abdominal hysterectomy 1998  . Laminectomy   . Coronary angioplasty     multiple  . Subclavian vein angioplasty / stenting     REVIEW OF SYSTEMS:  A comprehensive review of systems was negative except for: Constitutional: positive for fatigue Respiratory: positive for cough and dyspnea on exertion Gastrointestinal: positive for odynophagia   PHYSICAL EXAMINATION: General appearance: alert, cooperative and no distress Head: Normocephalic, without obvious abnormality, atraumatic Neck: no adenopathy Resp: clear to auscultation bilaterally Cardio: regular rate and rhythm, S1, S2 normal, no murmur, click, rub or gallop GI: soft, non-tender; bowel sounds normal; no masses,  no organomegaly Extremities: extremities normal, atraumatic, no cyanosis or edema Neurologic: Alert and oriented X 3, normal strength  and tone. Normal symmetric reflexes. Normal coordination and gait  ECOG PERFORMANCE STATUS: 1 - Symptomatic but completely ambulatory  Blood pressure 106/58, pulse 90, temperature 97.8 F (36.6 C), temperature source Oral, resp. rate 18, height 5\' 7"  (1.702 m), weight 103 lb 6.4 oz (46.902 kg).  LABORATORY DATA: Lab Results  Component Value Date   WBC 3.1* 04/10/2012   HGB 11.7 04/10/2012   HCT 33.6* 04/10/2012   MCV 98.5 04/10/2012   PLT 241 04/10/2012      Chemistry      Component Value Date/Time   NA 136 04/03/2012 1117   NA 139 02/07/2012 1227   K 3.8 04/03/2012 1117   K 4.2 02/07/2012 1227   CL 103  04/03/2012 1117   CL 101 02/07/2012 1227   CO2 26 04/03/2012 1117   CO2 29 02/07/2012 1227   BUN 8.0 04/03/2012 1117   BUN 9 02/07/2012 1227   CREATININE 0.7 04/03/2012 1117   CREATININE 0.50 02/07/2012 1227      Component Value Date/Time   CALCIUM 9.7 04/03/2012 1117   CALCIUM 10.4 02/07/2012 1227   ALKPHOS 103 04/03/2012 1117   ALKPHOS 106 02/07/2012 1227   AST 17 04/03/2012 1117   AST 18 02/07/2012 1227   ALT 21 04/03/2012 1117   ALT 13 02/07/2012 1227   BILITOT 0.53 04/03/2012 1117   BILITOT 0.5 02/07/2012 1227       RADIOGRAPHIC STUDIES: No results found.  ASSESSMENT: This is a very pleasant 60 years old white female with stage IIIB non-small cell lung cancer currently undergoing concurrent chemoradiation with weekly carboplatin. Paclitaxel was discontinued secondary to peripheral neuropathy.  PLAN: The patient is doing fine and tolerating her treatment fairly well. We'll proceed with the last cycle of her concurrent chemoradiation this week. She would come back for followup visit in 4 weeks with repeat CT scan of the chest for restaging of her disease.  She was advised to call me immediately if she has any concerning symptoms in the interval.  All questions were answered. The patient knows to call the clinic with any problems, questions or concerns. We can certainly  see the patient much sooner if necessary.  I spent 15 minutes counseling the patient face to face. The total time spent in the appointment was 25 minutes.

## 2012-04-10 NOTE — Telephone Encounter (Signed)
appts made and ptrinted for pt and per dr mkm pt does not need to see lab and or aj next week and that has been cx.  Pt also aware that cem. Sch. Will call with scan appt

## 2012-04-10 NOTE — Progress Notes (Signed)
Patient reports she is doing what she can. She continues to drink 5 Ensure Plus or boost plus daily. Her weight fluctuates a bit but is increased to 103.4 pounds today from 102.5 pounds November 11. Patient reports she continues to cough up blood. She is unsure of any further treatment.  Nutrition diagnosis: Unintended weight loss resolved.  I provided support and encouragement for patient to continue strategies for increased oral intake, including drinking oral nutrition supplements 5 times daily. She has my contact information and agrees to contact me if she has further questions or problems. She expresses appreciation for nutrition assistance.  Next visit: There is no followup scheduled. Patient will contact me for questions or concerns.

## 2012-04-10 NOTE — Patient Instructions (Signed)
Lab work is good today. We'll proceed with the last cycle of concurrent chemoradiation as scheduled. Followup in one month with repeat CT scan of the chest

## 2012-04-11 ENCOUNTER — Other Ambulatory Visit: Payer: Self-pay | Admitting: Internal Medicine

## 2012-04-11 ENCOUNTER — Ambulatory Visit: Payer: Medicaid Other

## 2012-04-11 ENCOUNTER — Ambulatory Visit
Admission: RE | Admit: 2012-04-11 | Discharge: 2012-04-11 | Disposition: A | Discharge status: Home / Self Care | Payer: Medicaid Other | Source: Ambulatory Visit | Attending: Radiation Oncology | Admitting: Radiation Oncology

## 2012-04-12 ENCOUNTER — Ambulatory Visit: Payer: Medicaid Other

## 2012-04-12 ENCOUNTER — Ambulatory Visit
Admission: RE | Admit: 2012-04-12 | Discharge: 2012-04-12 | Disposition: A | Discharge status: Home / Self Care | Payer: Medicaid Other | Source: Ambulatory Visit | Attending: Radiation Oncology | Admitting: Radiation Oncology

## 2012-04-13 ENCOUNTER — Ambulatory Visit: Payer: Medicaid Other

## 2012-04-13 ENCOUNTER — Ambulatory Visit
Admission: RE | Admit: 2012-04-13 | Discharge: 2012-04-13 | Disposition: A | Discharge status: Home / Self Care | Payer: Medicaid Other | Source: Ambulatory Visit | Attending: Radiation Oncology | Admitting: Radiation Oncology

## 2012-04-14 ENCOUNTER — Ambulatory Visit
Admission: RE | Admit: 2012-04-14 | Discharge: 2012-04-14 | Disposition: A | Discharge status: Home / Self Care | Payer: Medicaid Other | Source: Ambulatory Visit | Attending: Radiation Oncology | Admitting: Radiation Oncology

## 2012-04-14 ENCOUNTER — Ambulatory Visit: Admission: RE | Admit: 2012-04-14 | Payer: Medicaid Other | Source: Ambulatory Visit | Admitting: Radiation Oncology

## 2012-04-14 ENCOUNTER — Encounter: Payer: Self-pay | Admitting: Radiation Oncology

## 2012-04-14 VITALS — BP 95/57 | HR 90 | Temp 98.1°F | Resp 20 | Wt 101.4 lb

## 2012-04-14 DIAGNOSIS — C3410 Malignant neoplasm of upper lobe, unspecified bronchus or lung: Secondary | ICD-10-CM

## 2012-04-14 NOTE — Progress Notes (Signed)
Patient compleetd rad txs today right lung,33/33 rad txs, alert,oriented x3 Congestive cough, still coughing up blood in pheglm, stated"12x last night", this morning saw pinkish pheglm/yellowish mixed in, she states about 1 teaspoon each time yesterday", drank 3 ensures yesterday, ate roast beef sandwich last night, hard to swallow, even water is hard to swallow states patient., fatigued,  100% room air sats, drank 2 ensures this morning, and a bowl of rice krispies this am,"hurt like He--", gets stuck  In throat 10:25 AM

## 2012-04-14 NOTE — Progress Notes (Signed)
  Radiation Oncology         [phone] ________________________________  Name: [name] MRN: [id number]  Date: 04/14/2012  DOB: 02/09/1951  Weekly Radiation Therapy Management  Current Dose: 66 Gy     Planned Dose:  66 Gy  Narrative . . . . . . . . The patient presents for the final under treatment assessment.                  '                      Patient compleetd rad txs today right lung,33/33 rad txs, alert,oriented x3  Congestive cough, still coughing up blood in pheglm, stated"12x last night", this morning saw pinkish pheglm/yellowish mixed in, she states about 1 teaspoon each time yesterday", drank 3 ensures yesterday, ate roast beef sandwich last night, hard to swallow, even water is hard to swallow states patient., fatigued, 100% room air sats, drank 2 ensures this morning, and a bowl of rice krispies this am,"hurt like He--", gets stuck In throat  The patient has had some continuation of previously noted symptoms.                                 Set-up films were reviewed.                                 The chart was checked. Physical Findings. . . Weight essentially stable.  No significant changes. Impression . . . . . . . The patient tolerated radiation relatively well. Plan . . . . . . . . . . . . Complete radiation today as scheduled, and follow-up in one month. The patient was encouraged to call or return to the clinic in the interim for any worsening symptoms.  ________________________________  [name], M.D.

## 2012-04-17 ENCOUNTER — Ambulatory Visit: Payer: Medicaid Other | Admitting: Physician Assistant

## 2012-04-18 ENCOUNTER — Other Ambulatory Visit: Payer: Medicaid Other | Admitting: Lab

## 2012-04-18 ENCOUNTER — Ambulatory Visit: Payer: Medicaid Other | Admitting: Physician Assistant

## 2012-04-22 NOTE — Progress Notes (Signed)
  Radiation Oncology         [phone] ________________________________  Name: [name] MRN: [id number]  Date: 04/14/2012  DOB: 07/15/1950  End of Treatment Note  Diagnosis:  61 yo woman with T2a N2 M0 Squamous Cell Carcinoma of the Right Lung - Stage IIIB  Indication for treatment:  Curative, Definitive Chemoradiotherapy       Radiation treatment dates:   02/28/2012-04/14/2012  Site/dose:   The primary tumor and regional grossly involved nodes were treated to 66 Gy in 33 fractions  Beams/energy:   A five field technique was employed with a combination of 6 and 10 MV X-rays.  Ant, Post, LAO and RPO fields were used with a lightly weighted Rt Lat    Narrative: The patient tolerated radiation treatment relatively well.   Her acute side effects included congestive cough, coughing up blood in pheglm, esophagitis making it hard to swallow, even water, and fatigue    Plan: The patient has completed radiation treatment. The patient will return to radiation oncology clinic for routine followup in one month. I advised them to call or return sooner if they have any questions or concerns related to their recovery or treatment. ________________________________  Artist Pais. Kathrynn Running, M.D.

## 2012-04-25 ENCOUNTER — Emergency Department (HOSPITAL_COMMUNITY): Payer: Medicaid Other

## 2012-04-25 ENCOUNTER — Encounter (HOSPITAL_COMMUNITY): Payer: Self-pay | Admitting: Emergency Medicine

## 2012-04-25 ENCOUNTER — Telehealth: Payer: Self-pay | Admitting: *Deleted

## 2012-04-25 ENCOUNTER — Emergency Department (HOSPITAL_COMMUNITY)
Admission: EM | Admit: 2012-04-25 | Discharge: 2012-04-26 | Disposition: A | Discharge status: Home / Self Care | Payer: Medicaid Other | Attending: Emergency Medicine | Admitting: Emergency Medicine

## 2012-04-25 DIAGNOSIS — K219 Gastro-esophageal reflux disease without esophagitis: Secondary | ICD-10-CM | POA: Insufficient documentation

## 2012-04-25 DIAGNOSIS — Z7982 Long term (current) use of aspirin: Secondary | ICD-10-CM | POA: Insufficient documentation

## 2012-04-25 DIAGNOSIS — I739 Peripheral vascular disease, unspecified: Secondary | ICD-10-CM | POA: Insufficient documentation

## 2012-04-25 DIAGNOSIS — F411 Generalized anxiety disorder: Secondary | ICD-10-CM | POA: Insufficient documentation

## 2012-04-25 DIAGNOSIS — R0781 Pleurodynia: Secondary | ICD-10-CM

## 2012-04-25 DIAGNOSIS — F329 Major depressive disorder, single episode, unspecified: Secondary | ICD-10-CM | POA: Insufficient documentation

## 2012-04-25 DIAGNOSIS — R071 Chest pain on breathing: Secondary | ICD-10-CM | POA: Insufficient documentation

## 2012-04-25 DIAGNOSIS — Z79899 Other long term (current) drug therapy: Secondary | ICD-10-CM | POA: Insufficient documentation

## 2012-04-25 DIAGNOSIS — I10 Essential (primary) hypertension: Secondary | ICD-10-CM | POA: Insufficient documentation

## 2012-04-25 DIAGNOSIS — E785 Hyperlipidemia, unspecified: Secondary | ICD-10-CM | POA: Insufficient documentation

## 2012-04-25 DIAGNOSIS — J45909 Unspecified asthma, uncomplicated: Secondary | ICD-10-CM | POA: Insufficient documentation

## 2012-04-25 DIAGNOSIS — Z87891 Personal history of nicotine dependence: Secondary | ICD-10-CM | POA: Insufficient documentation

## 2012-04-25 DIAGNOSIS — F3289 Other specified depressive episodes: Secondary | ICD-10-CM | POA: Insufficient documentation

## 2012-04-25 DIAGNOSIS — J40 Bronchitis, not specified as acute or chronic: Secondary | ICD-10-CM | POA: Insufficient documentation

## 2012-04-25 DIAGNOSIS — J4489 Other specified chronic obstructive pulmonary disease: Secondary | ICD-10-CM | POA: Insufficient documentation

## 2012-04-25 DIAGNOSIS — R51 Headache: Secondary | ICD-10-CM | POA: Insufficient documentation

## 2012-04-25 DIAGNOSIS — J449 Chronic obstructive pulmonary disease, unspecified: Secondary | ICD-10-CM | POA: Insufficient documentation

## 2012-04-25 DIAGNOSIS — K649 Unspecified hemorrhoids: Secondary | ICD-10-CM | POA: Insufficient documentation

## 2012-04-25 DIAGNOSIS — I252 Old myocardial infarction: Secondary | ICD-10-CM | POA: Insufficient documentation

## 2012-04-25 DIAGNOSIS — I2510 Atherosclerotic heart disease of native coronary artery without angina pectoris: Secondary | ICD-10-CM | POA: Insufficient documentation

## 2012-04-25 DIAGNOSIS — Z8719 Personal history of other diseases of the digestive system: Secondary | ICD-10-CM | POA: Insufficient documentation

## 2012-04-25 DIAGNOSIS — R0602 Shortness of breath: Secondary | ICD-10-CM | POA: Insufficient documentation

## 2012-04-25 DIAGNOSIS — Z8619 Personal history of other infectious and parasitic diseases: Secondary | ICD-10-CM | POA: Insufficient documentation

## 2012-04-25 DIAGNOSIS — I509 Heart failure, unspecified: Secondary | ICD-10-CM | POA: Insufficient documentation

## 2012-04-25 LAB — CBC WITH DIFFERENTIAL/PLATELET
Basophils Absolute: 0 10*3/uL (ref 0.0–0.1)
Basophils Relative: 0 % (ref 0–1)
Eosinophils Relative: 0 % (ref 0–5)
HCT: 31.4 % — ABNORMAL LOW (ref 36.0–46.0)
MCHC: 34.7 g/dL (ref 30.0–36.0)
MCV: 99.4 fL (ref 78.0–100.0)
Monocytes Absolute: 0.6 10*3/uL (ref 0.1–1.0)
Neutro Abs: 3.4 10*3/uL (ref 1.7–7.7)
RDW: 16.3 % — ABNORMAL HIGH (ref 11.5–15.5)

## 2012-04-25 LAB — TROPONIN I: Troponin I: <0.3 ng/mL (ref <0.30)

## 2012-04-25 LAB — BASIC METABOLIC PANEL
Calcium: 9.5 mg/dL (ref 8.4–10.5)
Creatinine, Ser: 0.47 mg/dL — ABNORMAL LOW (ref 0.50–1.10)
GFR calc Af Amer: >90 mL/min (ref >90)

## 2012-04-25 MED ORDER — HYDROMORPHONE HCL PF 1 MG/ML IJ SOLN
1.0000 mg | Freq: Once | INTRAMUSCULAR | Status: AC
  Administered 2012-04-25: 1 mg via INTRAVENOUS
  Filled 2012-04-25: qty 1

## 2012-04-25 MED ORDER — HYDROCOD POLST-CHLORPHEN POLST 10-8 MG/5ML PO LQCR: 5.0000 mL | Freq: Two times a day (BID) | ORAL | Status: DC | PRN

## 2012-04-25 MED ORDER — IOHEXOL 350 MG/ML SOLN
75.0000 mL | Freq: Once | INTRAVENOUS | Status: AC | PRN
  Administered 2012-04-25: 75 mL via INTRAVENOUS

## 2012-04-25 MED ORDER — ALBUTEROL SULFATE (5 MG/ML) 0.5% IN NEBU
5.0000 mg | INHALATION_SOLUTION | Freq: Once | RESPIRATORY_TRACT | Status: AC
  Administered 2012-04-25: 5 mg via RESPIRATORY_TRACT
  Filled 2012-04-25: qty 1

## 2012-04-25 NOTE — ED Notes (Signed)
Pt brought to ED via [facility] complaining of mid sternal chest pain for 3 days, chest pain worsened today and pt became SOB and called EMS, pt has had cough as well. Pt with cardiac history, 9 stents in place and cardiac arrest in the past, EMS states pt will cardiac arrest if she gets [phone] of Aspirin given by EMS en route, no Nitro given due to systolic BP being in the 90's.  Pt being treated for lung cancer with chemo, last treatment 2 weeks ago, pt reports coughing up blood after treatment.  Pt placed on cardiac monitor, 100% on NRB, NAD noted at this time.

## 2012-04-25 NOTE — Telephone Encounter (Signed)
Returned call to patient, she states" since thanksgiving she has been having on her left side of her breast to her back pain, no fever gets short of breath when coughing yellow pheglm still, asked if she should go to urgent care or the ED to have chest xray, patient declined doing this"I know they'll probably put me in the hospital", eating a little stated, offered her then to come see Dr.manning this  as a follow up, Md is off today, Patient stated she would come in Thursday to see him and asked if he could order a chest xray while she is seeing him, informe her Md could order that same day, transferred call to [facility] front desk to set up appt afetr asking her agin if she needed to go to urgent care or the ED,"no again stated by patient

## 2012-04-25 NOTE — ED Notes (Signed)
Pt taken off of NRB, oxygen saturation 96% on room air.  Complaining of left sided chest pain that is worse when taking a deep breath or with movement.

## 2012-04-25 NOTE — ED Notes (Signed)
Patient transported to X-ray 

## 2012-04-25 NOTE — ED Provider Notes (Signed)
History     CSN: [id number]  Arrival date & time 04/25/12  [id number]   First MD Initiated Contact with Patient 04/25/12 1925      Chief Complaint  Patient presents with  . Chest Pain  . Shortness of Breath    (Consider location/radiation/quality/duration/timing/severity/associated sxs/prior treatment) HPI Pt with history of lung cancer recently finished radiation and continuing chemotherapy reports she has had productive cough for the last several days, occasionally has had bloody sputum, but no fever. Cough is associated with severe pleuritic pain in L chest, worse with cough. Not similar to previous cardiac pain.   Past Medical History  Diagnosis Date  . Acute myocardial infarction, unspecified site, episode of care unspecified   . Esophageal dysmotility   . Unspecified hemorrhoids without mention of complication   . Hemorrhoids   . Esophageal stricture   . Diaphragmatic hernia without mention of obstruction or gangrene   . 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 Coronary atherosclerosis of unspecified type of vessel, native or graft   . Peripheral neuropathy   . Other and unspecified hyperlipidemia   . Peptic ulcer, unspecified site, unspecified as acute or chronic, without mention of hemorrhage, perforation, or obstruction   . Unspecified cardiovascular disease   . Depressive disorder, not elsewhere classified   . Anxiety state, unspecified   . COPD (chronic obstructive pulmonary disease)   . GERD (gastroesophageal reflux disease)   . Candida esophagitis     on multiple occasions  . C. difficile diarrhea   . PVD (peripheral vascular disease)   . Headache   . Weight loss 06/13/2011    scheduled here by family doctor  . CHF (congestive heart failure) 2011  . Asthma   . Hypertension     Past Surgical History  Procedure Date  . Tonsillectomy 1974  . Fracture lt jaw 1976  . Breast surgery 1969, 1970    bilateral  . Epigastric hernia repair 2008    Dr  Dorris Fetch  . Coronary artery bypass graft 1995  . Carpal tunnel release   . Total abdominal hysterectomy 1998  . Laminectomy   . Coronary angioplasty     multiple  . Subclavian vein angioplasty / stenting     Family History  Problem Relation Age of Onset  . Lung cancer Father   . Heart disease Father   . Asthma Father   . Allergies Father   . Cancer Father   . Heart disease Mother   . Cancer Mother   . Heart disease Sister   . Colon cancer Neg Hx   . Cancer Brother     History  Substance Use Topics  . Smoking status: Former Smoker -- 1.5 packs/day for 30 years    Types: Cigarettes    Quit date: 05/24/2009  . Smokeless tobacco: Never Used  . Alcohol Use: No    OB History    Grav Para Term Preterm Abortions TAB SAB Ect Mult Living                  Review of Systems All other systems reviewed and are negative except as noted in HPI.   Allergies  Amiodarone and Clopidogrel bisulfate  Home Medications   Current Outpatient Rx  Name  Route  Sig  Dispense  Refill  . ALBUTEROL SULFATE HFA 108 (90 BASE) MCG/ACT IN AERS   Inhalation   Inhale 2 puffs into the lungs every 6 (six) hours as needed. For wheezing         .  ASPIRIN 81 MG PO TBEC   Oral   Take 81 mg by mouth daily.           . ATORVASTATIN CALCIUM 80 MG PO TABS   Oral   Take 80 mg by mouth at bedtime.         . BUDESONIDE-FORMOTEROL FUMARATE 160-4.5 MCG/ACT IN AERO   Inhalation   Inhale 2 puffs into the lungs 2 (two) times daily.         Marland Kitchen VITAMIN D-3 5000 UNITS PO TABS   Oral   Take 1 tablet by mouth daily.         Marland Kitchen EZETIMIBE 10 MG PO TABS   Oral   Take 10 mg by mouth daily.           Marland Kitchen FAMOTIDINE 20 MG PO TABS   Oral   Take 20 mg by mouth at bedtime.         Marland Kitchen MONTELUKAST SODIUM 10 MG PO TABS   Oral   Take 10 mg by mouth at bedtime.           . NEBIVOLOL HCL 2.5 MG PO TABS   Oral   Take 1 tablet (2.5 mg total) by mouth daily.   30 tablet   0   . NITROGLYCERIN  0.4 MG SL SUBL   Sublingual   Place 0.4 mg under the tongue every 5 (five) minutes as needed. For chest pain         . OMEPRAZOLE 20 MG PO CPDR   Oral   Take 20 mg by mouth 2 (two) times daily.           Marland Kitchen PRASUGREL HCL 10 MG PO TABS   Oral   Take 10 mg by mouth daily.          Marland Kitchen TIOTROPIUM BROMIDE MONOHYDRATE 18 MCG IN CAPS   Inhalation   Place 18 mcg into inhaler and inhale daily.           BP 119/58  Pulse 87  Temp 98.4 F (36.9 C) (Axillary)  Resp 18  SpO2 96%  Physical Exam  Nursing note and vitals reviewed. Constitutional: She is oriented to person, place, and time. She appears well-developed and well-nourished.  HENT:  Head: Normocephalic and atraumatic.  Eyes: EOM are normal. Pupils are equal, round, and reactive to light.  Neck: Normal range of motion. Neck supple.  Cardiovascular: Normal rate, normal heart sounds and intact distal pulses.   Pulmonary/Chest: Effort normal. She has wheezes. She has rales. She exhibits tenderness.  Abdominal: Bowel sounds are normal. She exhibits no distension. There is no tenderness.  Musculoskeletal: Normal range of motion. She exhibits no edema and no tenderness.  Neurological: She is alert and oriented to person, place, and time. She has normal strength. No cranial nerve deficit or sensory deficit.  Skin: Skin is warm and dry. No rash noted.  Psychiatric: She has a normal mood and affect.    ED Course  Procedures (including critical care time)  Labs Reviewed  CBC WITH DIFFERENTIAL - Abnormal; Notable for the following:    RBC 3.16 (*)     Hemoglobin 10.9 (*)     HCT 31.4 (*)     MCH 34.5 (*)     RDW 16.3 (*)     All other components within normal limits  BASIC METABOLIC PANEL - Abnormal; Notable for the following:    Potassium 3.4 (*)     Glucose, Bld 115 (*)  BUN 5 (*)     Creatinine, Ser 0.47 (*)     All other components within normal limits  TROPONIN I  TROPONIN I   Dg Chest 2 View  04/25/2012   *RADIOLOGY REPORT*  Clinical Data: Chest pain and shortness of breath.  Lung carcinoma.  CHEST - 2 VIEW  Comparison: 02/07/2012  Findings: Pulmonary hyperinflation demonstrated.  No evidence of acute infiltrate or edema.  No evidence of pleural effusion or pneumothorax.  Heart size is normal.  Previously seen mass-like opacity in the central right upper lobe has decreased in size.  IMPRESSION: Decreased size of mass-like opacity in the central right upper lobe.  No acute findings.   Original Report Authenticated By: Myles Rosenthal, M.D.    Ct Angio Chest Pe W/cm &/or Wo Cm  04/25/2012  *RADIOLOGY REPORT*  Clinical Data: Chest pain.  Short of breath.  Cough.  Lung carcinoma.  Currently undergoing chemotherapy.  CT ANGIOGRAPHY CHEST  Technique:  Multidetector CT imaging of the chest using the standard protocol during bolus administration of intravenous contrast. Multiplanar reconstructed images including MIPs were obtained and reviewed to evaluate the vascular anatomy.  Contrast: 75mL OMNIPAQUE IOHEXOL 350 MG/ML SOLN  Comparison: PET CT on 02/15/2012  Findings: Satisfactory opacification of the pulmonary arteries noted, and there is no evidence of pulmonary emboli.  A cavitary mass is seen in the posterior right upper lobe measuring 1.3 x 2.0 cm which is decreased in size from previous study when measured 2.9 x 3.7 cm.  Mild emphysema again noted.  No other suspicious pulmonary nodules or masses are identified.  Left lower lobe scarring again noted.  No evidence of pleural or pericardial effusion.  No evidence of hilar or mediastinal masses.  No lymphadenopathy identified within the thorax.  Harrington rods again seen in the lower thoracic and lumbar spine.  IMPRESSION:  1.  No evidence of pulmonary embolism or other acute findings. 2.  Interval decrease in size of posterior right upper lobe mass. 3.  No new or progressive disease identified within the thorax.   Original Report Authenticated By: Myles Rosenthal, M.D.       No diagnosis found.    MDM   Date: 04/25/2012  Rate: 86  Rhythm: normal sinus rhythm  QRS Axis: normal  Intervals: normal  ST/T Wave abnormalities: nonspecific T wave changes  Conduction Disutrbances:none  Narrative Interpretation:   Old EKG Reviewed: changes noted, nonspecific T wave change  9:22 PM Pt with pleuritic chest pain, cough and SOB with reported hemoptysis. Doubt this is cardiac in etiology, will send for CTA to rule out PE. Check repeat Troponin.   10:50 PM Repeat Trop neg, CTA neg for PE, tumor is smaller. Pt would like to go home. Do not feel this is ACS. She is requesting cough medicine, has inhalers at home.        Daine Gunther B. Bernette Mayers, MD 04/25/12 2251

## 2012-04-26 ENCOUNTER — Telehealth: Payer: Self-pay | Admitting: *Deleted

## 2012-04-26 ENCOUNTER — Encounter: Payer: Self-pay | Admitting: Radiation Oncology

## 2012-04-26 DIAGNOSIS — A0472 Enterocolitis due to Clostridium difficile, not specified as recurrent: Secondary | ICD-10-CM | POA: Insufficient documentation

## 2012-04-26 DIAGNOSIS — Z923 Personal history of irradiation: Secondary | ICD-10-CM | POA: Insufficient documentation

## 2012-04-26 DIAGNOSIS — J449 Chronic obstructive pulmonary disease, unspecified: Secondary | ICD-10-CM | POA: Insufficient documentation

## 2012-04-26 DIAGNOSIS — I739 Peripheral vascular disease, unspecified: Secondary | ICD-10-CM | POA: Insufficient documentation

## 2012-04-26 DIAGNOSIS — R51 Headache: Secondary | ICD-10-CM | POA: Insufficient documentation

## 2012-04-26 DIAGNOSIS — K224 Dyskinesia of esophagus: Secondary | ICD-10-CM | POA: Insufficient documentation

## 2012-04-26 DIAGNOSIS — J45909 Unspecified asthma, uncomplicated: Secondary | ICD-10-CM | POA: Insufficient documentation

## 2012-04-26 DIAGNOSIS — I509 Heart failure, unspecified: Secondary | ICD-10-CM | POA: Insufficient documentation

## 2012-04-26 DIAGNOSIS — R519 Headache, unspecified: Secondary | ICD-10-CM | POA: Insufficient documentation

## 2012-04-26 DIAGNOSIS — I10 Essential (primary) hypertension: Secondary | ICD-10-CM | POA: Insufficient documentation

## 2012-04-26 DIAGNOSIS — I219 Acute myocardial infarction, unspecified: Secondary | ICD-10-CM | POA: Insufficient documentation

## 2012-04-26 DIAGNOSIS — G629 Polyneuropathy, unspecified: Secondary | ICD-10-CM | POA: Insufficient documentation

## 2012-04-26 DIAGNOSIS — K222 Esophageal obstruction: Secondary | ICD-10-CM | POA: Insufficient documentation

## 2012-04-26 NOTE — Telephone Encounter (Signed)
Called pt to check on her after her ED visit yesterday. She states she still wants to see Dr Kathrynn Running, states she "didn't get Prednisone and an antibiotic". Pt has FU 04/27/12, 2:45 pm; she is aware of time.

## 2012-04-27 ENCOUNTER — Ambulatory Visit
Admission: RE | Admit: 2012-04-27 | Discharge: 2012-04-27 | Disposition: A | Discharge status: Home / Self Care | Payer: Medicaid Other | Source: Ambulatory Visit | Attending: Radiation Oncology | Admitting: Radiation Oncology

## 2012-04-27 ENCOUNTER — Encounter: Payer: Self-pay | Admitting: Radiation Oncology

## 2012-04-27 VITALS — BP 116/86 | HR 87 | Temp 98.5°F | Resp 20 | Wt 102.9 lb

## 2012-04-27 DIAGNOSIS — R079 Chest pain, unspecified: Secondary | ICD-10-CM

## 2012-04-27 DIAGNOSIS — R0600 Dyspnea, unspecified: Secondary | ICD-10-CM

## 2012-04-27 DIAGNOSIS — R05 Cough: Secondary | ICD-10-CM

## 2012-04-27 DIAGNOSIS — C3410 Malignant neoplasm of upper lobe, unspecified bronchus or lung: Secondary | ICD-10-CM

## 2012-04-27 MED ORDER — AMOXICILLIN-POT CLAVULANATE 875-125 MG PO TABS: 1.0000 | ORAL_TABLET | Freq: Two times a day (BID) | ORAL | Status: AC

## 2012-04-27 MED ORDER — PREDNISONE 5 MG PO TABS: 5.0000 mg | ORAL_TABLET | Freq: Every day | ORAL | Status: DC

## 2012-04-27 MED ORDER — OXYCODONE HCL 5 MG PO TABS: 5.0000 mg | ORAL_TABLET | ORAL | Status: DC | PRN

## 2012-04-27 NOTE — Progress Notes (Signed)
Pt c/o productive congested cough w/dark greenish-yellow sputum w/bright red blood, SOB w/exertion and at times, w//o exertion. She states she could not afford cough syrup given her in ED on 04/25/12, is not taking any OTC for cough.  Pt c/o generalized pain 8/10, more in her ribs, chest, throat. Pt out of Oxy-IR, last script 03/30/12. She states she is drinking 4 Ensures daily and eating a few soft foods. Pt expressed desire to be put on steroids and antibiotics.

## 2012-04-27 NOTE — Progress Notes (Signed)
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 She states she could not afford cough syrup given her in ED on 04/25/12, is not taking any OTC for cough. Pt c/o generalized pain 8/10, more in her ribs, chest, throat. Pt out of Oxy-IR, last script 03/30/12. She states she is drinking 4 Ensures daily and eating a few soft foods. Pt expressed desire to be put on steroids and antibiotics  ALLERGIES:  is allergic to amiodarone; clopidogrel bisulfate; and dilaudid.  Meds: Current Outpatient Prescriptions  Medication Sig Dispense Refill  . albuterol (PROVENTIL HFA;VENTOLIN HFA) 108 (90 BASE) MCG/ACT inhaler Inhale 2 puffs into the lungs every 6 (six) hours as needed. For wheezing      . aspirin 81 MG EC tablet Take 81 mg by mouth daily.        Marland Kitchen atorvastatin (LIPITOR) 80 MG tablet Take 80 mg by mouth at bedtime.      . budesonide-formoterol (SYMBICORT) 160-4.5 MCG/ACT inhaler Inhale 2 puffs into the lungs 2 (two) times daily.      . Cholecalciferol (VITAMIN D-3) 5000 UNITS TABS Take 1 tablet by mouth daily.      Marland Kitchen ezetimibe (ZETIA) 10 MG tablet Take 10 mg by mouth daily.        . famotidine (PEPCID) 20 MG tablet Take 20 mg by mouth at bedtime.      . montelukast (SINGULAIR) 10 MG tablet Take 10 mg by mouth at bedtime.        . nebivolol (BYSTOLIC) 2.5 MG tablet Take 1 tablet (2.5 mg total) by mouth daily.  30 tablet  0  . nitroGLYCERIN (NITROSTAT) 0.4 MG SL tablet Place 0.4 mg under the tongue every 5 (five) minutes as needed. For  chest pain      . omeprazole (PRILOSEC) 20 MG capsule Take 20 mg by mouth 2 (two) times daily.        . prasugrel (EFFIENT) 10 MG TABS Take 10 mg by mouth daily.       Marland Kitchen tiotropium (SPIRIVA) 18 MCG inhalation capsule Place 18 mcg into inhaler and inhale daily.      . chlorpheniramine-HYDROcodone (TUSSIONEX PENNKINETIC ER) 10-8 MG/5ML LQCR Take 5 mLs by mouth every 12 (twelve) hours as needed (cough).  140 mL  0   Physical Findings: The patient is in no acute distress. Patient is alert and oriented.  weight is 102 lb 14.4 oz (46.675 kg). Her oral temperature is 98.5 F (36.9 C). Her blood pressure is 116/86 and her pulse is 87. Her respiration is 20 and oxygen saturation is 100%. . Lungs clear, mild rib pain. No significant changes.  Radiographic Findings: Dg Chest 2 View  04/25/2012  *RADIOLOGY REPORT*  Clinical Data: Chest pain and shortness of breath.  Lung carcinoma.  CHEST - 2 VIEW  Comparison: 02/07/2012  Findings: Pulmonary hyperinflation demonstrated.  No evidence of acute infiltrate or edema.  No evidence of pleural effusion or pneumothorax.  Heart size is normal.  Previously seen mass-like opacity in the  central right upper lobe has decreased in size.  IMPRESSION: Decreased size of mass-like opacity in the central right upper lobe.  No acute findings.   Original Report Authenticated By: Myles Rosenthal, M.D.    Ct Angio Chest Pe W/cm &/or Wo Cm  04/25/2012  *RADIOLOGY REPORT*  Clinical Data: Chest pain.  Short of breath.  Cough.  Lung carcinoma.  Currently undergoing chemotherapy.  CT ANGIOGRAPHY CHEST  Technique:  Multidetector CT imaging of the chest using the standard protocol during bolus administration of intravenous contrast. Multiplanar reconstructed images including MIPs were obtained and reviewed to evaluate the vascular anatomy.  Contrast: 75mL OMNIPAQUE IOHEXOL 350 MG/ML SOLN  Comparison: PET CT on 02/15/2012  Findings: Satisfactory opacification of the pulmonary arteries noted, and  there is no evidence of pulmonary emboli.  A cavitary mass is seen in the posterior right upper lobe measuring 1.3 x 2.0 cm which is decreased in size from previous study when measured 2.9 x 3.7 cm.  Mild emphysema again noted.  No other suspicious pulmonary nodules or masses are identified.  Left lower lobe scarring again noted.  No evidence of pleural or pericardial effusion.  No evidence of hilar or mediastinal masses.  No lymphadenopathy identified within the thorax.  Harrington rods again seen in the lower thoracic and lumbar spine.   IMPRESSION:   1.  No evidence of pulmonary embolism or other acute findings.  2.  Interval decrease in size of posterior right upper lobe mass.  3.  No new or progressive disease identified within the thorax.    Original Report Authenticated By: Myles Rosenthal, M.D.    Impression:  The patient is recovering from the effects of radiation and seems to have acute bronchitis.  Plan:  Refilled OxyIR, given Levaquin and prednisone, and return in a few weeks.  _____________________________________  [name], M.D.

## 2012-05-08 ENCOUNTER — Telehealth: Payer: Self-pay | Admitting: Internal Medicine

## 2012-05-08 ENCOUNTER — Encounter: Payer: Self-pay | Admitting: Physician Assistant

## 2012-05-08 ENCOUNTER — Ambulatory Visit (HOSPITAL_BASED_OUTPATIENT_CLINIC_OR_DEPARTMENT_OTHER): Payer: Medicaid Other | Admitting: Physician Assistant

## 2012-05-08 ENCOUNTER — Other Ambulatory Visit: Payer: Medicaid Other | Admitting: Lab

## 2012-05-08 VITALS — BP 95/63 | HR 73 | Temp 97.6°F | Resp 18 | Ht 67.0 in | Wt 101.3 lb

## 2012-05-08 DIAGNOSIS — C3410 Malignant neoplasm of upper lobe, unspecified bronchus or lung: Secondary | ICD-10-CM

## 2012-05-08 LAB — CBC WITH DIFFERENTIAL/PLATELET
Basophils Absolute: 0 10*3/uL (ref 0.0–0.1)
EOS%: 1.2 % (ref 0.0–7.0)
MCH: 36.8 pg — ABNORMAL HIGH (ref 25.1–34.0)
MCHC: 35.5 g/dL (ref 31.5–36.0)
MCV: 103.7 fL — ABNORMAL HIGH (ref 79.5–101.0)
MONO%: 8.3 % (ref 0.0–14.0)
RBC: 3.38 10*6/uL — ABNORMAL LOW (ref 3.70–5.45)
RDW: 16.9 % — ABNORMAL HIGH (ref 11.2–14.5)

## 2012-05-08 LAB — COMPREHENSIVE METABOLIC PANEL (CC13)
AST: 14 U/L (ref 5–34)
Albumin: 3.6 g/dL (ref 3.5–5.0)
Alkaline Phosphatase: 103 U/L (ref 40–150)
BUN: 9 mg/dL (ref 7.0–26.0)
Potassium: 3.8 mEq/L (ref 3.5–5.1)
Sodium: 141 mEq/L (ref 136–145)

## 2012-05-08 NOTE — Patient Instructions (Addendum)
Your recent CT scan of the chest revealed a decrease in the size of your right upper lobe mass and no new or progressive disease He'll followup with Dr. Arbutus Ped in 3 months with a repeat CBC differential, C. met and restaging CT scan of your chest to reevaluate her disease.

## 2012-05-08 NOTE — Progress Notes (Signed)
[facility] Telephone:([phone]   Fax:([phone]  OFFICE PROGRESS NOTE  [name], MD [address] [facility], [place]. [place] [address]  DIAGNOSIS: Stage IIIa (T2a, N2, MX) non-small cell lung cancer, squamous cell carcinoma diagnosed in September of 2013.   PRIOR THERAPY: Status post a course of concurrent chemoradiation with chemotherapy in the form of weekly carboplatin for AUC of 2 and paclitaxel at 45 mg per meter squared. The paclitaxel was discontinued after cycle #5 secondary to peripheral neuropathy.  CURRENT THERAPY: None  INTERVAL HISTORY: [name] 61 y.o. female returns to the clinic today for routine followup visit. On 04/25/2012 patient presented to the emergency room complaining of increased cough with blood-tinged secretions. She was evaluated fully which included a CT angiography. She was due for restaging CT scan of the chest on 05/10/2012. She presents today to discuss the results of the CT scan as it relates to her lung cancer. She is aware that she did not have any evidence of pulmonary emboli. Overall she is doing well with the exception of left rib cage soreness. Patient states that she coughs so hard she says she "cracked my ribs". Her appetite is slowly improving. She denied having any significant chest pain but continues to have shortness breath with exertion and mild cough with occasional blood tinged sputum production, but no frank hemoptysis. Her weight is do a few pounds.wn She denied having any significant night sweats.   MEDICAL HISTORY: Past Medical History  Diagnosis Date  . Acute myocardial infarction, unspecified site, episode of care unspecified   . Esophageal dysmotility   . Unspecified hemorrhoids without mention of complication   . Hemorrhoids   . Esophageal stricture   . Diaphragmatic hernia without mention of obstruction or gangrene   . 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 Coronary atherosclerosis of unspecified type of vessel, native or graft   . Peripheral neuropathy   . Other and unspecified hyperlipidemia   . Peptic ulcer, unspecified site, unspecified as acute or chronic, without mention of hemorrhage, perforation, or obstruction   . Unspecified cardiovascular disease   . Depressive disorder, not elsewhere classified   . Anxiety state, unspecified   . COPD (chronic obstructive pulmonary disease)   . GERD (gastroesophageal reflux disease)   . Candida esophagitis     on multiple occasions  . C. difficile diarrhea   . PVD (peripheral vascular disease)   . Headache   . Weight loss 06/13/2011    scheduled here by family doctor  . CHF (congestive heart failure) 2011  . Asthma   . Hypertension   . Hx of radiation therapy 02/28/12 -04/14/12    right lung    ALLERGIES:  is allergic to amiodarone; clopidogrel bisulfate; and dilaudid.  MEDICATIONS:  Current Outpatient Prescriptions  Medication Sig Dispense Refill  . albuterol (PROVENTIL HFA;VENTOLIN HFA) 108 (90 BASE) MCG/ACT inhaler Inhale 2 puffs into the lungs every 6 (six) hours as needed. For wheezing      . aspirin 81 MG EC tablet Take 81 mg by mouth daily.        Marland Kitchen atorvastatin (LIPITOR) 80 MG tablet Take 80 mg by mouth at bedtime.      . budesonide-formoterol (SYMBICORT) 160-4.5 MCG/ACT inhaler Inhale 2 puffs into the lungs 2 (two) times daily.      . chlorpheniramine-HYDROcodone (TUSSIONEX PENNKINETIC ER) 10-8 MG/5ML LQCR Take 5 mLs by mouth every 12 (twelve) hours as needed (  cough).  140 mL  0  . Cholecalciferol (VITAMIN D-3) 5000 UNITS TABS Take 1 tablet by mouth daily.      Marland Kitchen ezetimibe (ZETIA) 10 MG tablet Take 10 mg by mouth daily.        . famotidine (PEPCID) 20 MG tablet Take 20 mg by mouth at bedtime.      . montelukast (SINGULAIR) 10 MG tablet Take 10 mg by mouth at bedtime.        . nebivolol (BYSTOLIC) 2.5 MG tablet Take 1 tablet (2.5 mg total) by mouth daily.  30 tablet   0  . nitroGLYCERIN (NITROSTAT) 0.4 MG SL tablet Place 0.4 mg under the tongue every 5 (five) minutes as needed. For chest pain      . omeprazole (PRILOSEC) 20 MG capsule Take 20 mg by mouth 2 (two) times daily.        Marland Kitchen oxyCODONE (OXY IR/ROXICODONE) 5 MG immediate release tablet Take 1-3 tablets (5-15 mg total) by mouth every 4 (four) hours as needed for pain.  90 tablet  0  . prasugrel (EFFIENT) 10 MG TABS Take 10 mg by mouth daily.       . predniSONE (DELTASONE) 5 MG tablet Take 1 tablet (5 mg total) by mouth daily.  14 tablet  0  . tiotropium (SPIRIVA) 18 MCG inhalation capsule Place 18 mcg into inhaler and inhale daily.        SURGICAL HISTORY:  Past Surgical History  Procedure Date  . Tonsillectomy 1974  . Fracture lt jaw 1976  . Breast surgery 1969, 1970    bilateral  . Epigastric hernia repair 2008    Dr Dorris Fetch  . Coronary artery bypass graft 1995  . Carpal tunnel release   . Total abdominal hysterectomy 1998  . Laminectomy   . Coronary angioplasty     multiple  . Subclavian vein angioplasty / stenting     REVIEW OF SYSTEMS:  A comprehensive review of systems was negative except for: Constitutional: positive for fatigue Respiratory: positive for cough and dyspnea on exertion   PHYSICAL EXAMINATION: General appearance: alert, cooperative and no distress Head: Normocephalic, without obvious abnormality, atraumatic Neck: no adenopathy Resp: clear to auscultation bilaterally Cardio: regular rate and rhythm, S1, S2 normal, no murmur, click, rub or gallop GI: soft, non-tender; bowel sounds normal; no masses,  no organomegaly Extremities: extremities normal, atraumatic, no cyanosis or edema Neurologic: Alert and oriented X 3, normal strength and tone. Normal symmetric reflexes. Normal coordination and gait  ECOG PERFORMANCE STATUS: 1 - Symptomatic but completely ambulatory  Blood pressure 95/63, pulse 73, temperature 97.6 F (36.4 C), temperature source Oral, resp. rate  18, height 5\' 7"  (1.702 m), weight 101 lb 4.8 oz (45.949 kg).  LABORATORY DATA: Lab Results  Component Value Date   WBC 4.7 05/08/2012   HGB 12.4 05/08/2012   HCT 35.0 05/08/2012   MCV 103.7* 05/08/2012   PLT 240 05/08/2012      Chemistry      Component Value Date/Time   NA 141 05/08/2012 0847   NA 139 04/25/2012 1909   K 3.8 05/08/2012 0847   K 3.4* 04/25/2012 1909   CL 106 05/08/2012 0847   CL 103 04/25/2012 1909   CO2 27 05/08/2012 0847   CO2 28 04/25/2012 1909   BUN 9.0 05/08/2012 0847   BUN 5* 04/25/2012 1909   CREATININE 0.6 05/08/2012 0847   CREATININE 0.47* 04/25/2012 1909      Component Value Date/Time   CALCIUM 9.5  05/08/2012 0847   CALCIUM 9.5 04/25/2012 1909   ALKPHOS 103 05/08/2012 0847   ALKPHOS 106 02/07/2012 1227   AST 14 05/08/2012 0847   AST 18 02/07/2012 1227   ALT 13 05/08/2012 0847   ALT 13 02/07/2012 1227   BILITOT 0.43 05/08/2012 0847   BILITOT 0.5 02/07/2012 1227       RADIOGRAPHIC STUDIES: Dg Chest 2 View  04/25/2012  *RADIOLOGY REPORT*  Clinical Data: Chest pain and shortness of breath.  Lung carcinoma.  CHEST - 2 VIEW  Comparison: 02/07/2012  Findings: Pulmonary hyperinflation demonstrated.  No evidence of acute infiltrate or edema.  No evidence of pleural effusion or pneumothorax.  Heart size is normal.  Previously seen mass-like opacity in the central right upper lobe has decreased in size.  IMPRESSION: Decreased size of mass-like opacity in the central right upper lobe.  No acute findings.   Original Report Authenticated By: Myles Rosenthal, M.D.    Ct Angio Chest Pe W/cm &/or Wo Cm  04/25/2012  *RADIOLOGY REPORT*  Clinical Data: Chest pain.  Short of breath.  Cough.  Lung carcinoma.  Currently undergoing chemotherapy.  CT ANGIOGRAPHY CHEST  Technique:  Multidetector CT imaging of the chest using the standard protocol during bolus administration of intravenous contrast. Multiplanar reconstructed images including MIPs were obtained and reviewed to evaluate  the vascular anatomy.  Contrast: 75mL OMNIPAQUE IOHEXOL 350 MG/ML SOLN  Comparison: PET CT on 02/15/2012  Findings: Satisfactory opacification of the pulmonary arteries noted, and there is no evidence of pulmonary emboli.  A cavitary mass is seen in the posterior right upper lobe measuring 1.3 x 2.0 cm which is decreased in size from previous study when measured 2.9 x 3.7 cm.  Mild emphysema again noted.  No other suspicious pulmonary nodules or masses are identified.  Left lower lobe scarring again noted.  No evidence of pleural or pericardial effusion.  No evidence of hilar or mediastinal masses.  No lymphadenopathy identified within the thorax.  Harrington rods again seen in the lower thoracic and lumbar spine.  IMPRESSION:  1.  No evidence of pulmonary embolism or other acute findings. 2.  Interval decrease in size of posterior right upper lobe mass. 3.  No new or progressive disease identified within the thorax.   Original Report Authenticated By: Myles Rosenthal, M.D.    ASSESSMENT/PLAN: This is a very pleasant 61 years old white female with stage IIIB non-small cell lung cancer  status post a course of concurrent chemoradiation with weekly carboplatin. Paclitaxel was discontinued secondary to peripheral neuropathy.The patient was discussed with Dr. Arbutus Ped. The results of the CT angiography was reviewed with Dr. Arbutus Ped. As she has had decrease in the size of her posterior right upper lobe mass and has no evidence of any new or progressive disease, she will be placed on observation. She will followup with Dr. Arbutus Ped in 3 months with repeat CBC differential, C. met and CT of the chest with contrast to reevaluate her disease. We will reschedule the CT scan from 05/10/2012 to 08/04/2012.   Laural Benes, Cadel Stairs E, PA-C   All questions were answered. The patient knows to call the clinic with any problems, questions or concerns. We can certainly see the patient much sooner if necessary.  I spent 20 minutes  counseling the patient face to face. The total time spent in the appointment was 30 minutes.

## 2012-05-08 NOTE — Telephone Encounter (Signed)
appts made and printed for pt aom °

## 2012-05-10 ENCOUNTER — Ambulatory Visit (HOSPITAL_COMMUNITY): Payer: Medicaid Other

## 2012-05-11 ENCOUNTER — Ambulatory Visit: Payer: Medicaid Other | Admitting: Radiation Oncology

## 2012-05-26 ENCOUNTER — Telehealth: Payer: Self-pay | Admitting: *Deleted

## 2012-05-26 ENCOUNTER — Encounter: Payer: Self-pay | Admitting: *Deleted

## 2012-05-26 NOTE — Telephone Encounter (Signed)
Called patient to let her know Rx is redy ,must bring ID, can pick up at nurse station in Radiation oncology, patient thanked me for the call back, will be here shortly to pick up 11:40 AM

## 2012-05-26 NOTE — Telephone Encounter (Signed)
error 

## 2012-05-29 ENCOUNTER — Other Ambulatory Visit: Payer: Self-pay | Admitting: Internal Medicine

## 2012-06-27 ENCOUNTER — Telehealth: Payer: Self-pay | Admitting: *Deleted

## 2012-06-27 NOTE — Telephone Encounter (Signed)
Returned patient call, informed her Dr.Manning i off on Tuesdays, patient requesting refill on her Oxycodone, last refilled 05/26/12, has 4 tabs left stated, will call her in am after speaking with Md  11:12 AM

## 2012-06-28 ENCOUNTER — Telehealth: Payer: Self-pay | Admitting: *Deleted

## 2012-06-28 NOTE — Telephone Encounter (Signed)
Called patient home, can pick up rx today  At nursing, before 6pm, spouse answered knows to bring ID  3:07 PM

## 2012-06-29 ENCOUNTER — Telehealth: Payer: Self-pay | Admitting: *Deleted

## 2012-06-29 NOTE — Telephone Encounter (Signed)
Called patient   And spoke with her To see if she was going to come today to pick up pian rx written by Dr.Manning, still here in our drawer,"No, my back Dr. Catalina Pizza me not to take this with my other pain med said it didn't mix well" "so I won't be coming to get that Rx, " thanked me for the call and to thank Dr.Mannning any ways, , will inform MD and tore up this Rx 10:38 AM

## 2012-07-14 ENCOUNTER — Other Ambulatory Visit: Payer: Self-pay | Admitting: Physician Assistant

## 2012-07-14 ENCOUNTER — Other Ambulatory Visit: Payer: Self-pay | Admitting: Pulmonary Disease

## 2012-08-04 ENCOUNTER — Other Ambulatory Visit (HOSPITAL_BASED_OUTPATIENT_CLINIC_OR_DEPARTMENT_OTHER): Payer: Medicaid Other | Admitting: Lab

## 2012-08-04 ENCOUNTER — Ambulatory Visit (HOSPITAL_COMMUNITY)
Admission: RE | Admit: 2012-08-04 | Discharge: 2012-08-04 | Disposition: A | Discharge status: Home / Self Care | Payer: Medicaid Other | Source: Ambulatory Visit | Attending: Internal Medicine | Admitting: Internal Medicine

## 2012-08-04 DIAGNOSIS — X58XXXA Exposure to other specified factors, initial encounter: Secondary | ICD-10-CM | POA: Insufficient documentation

## 2012-08-04 DIAGNOSIS — C3490 Malignant neoplasm of unspecified part of unspecified bronchus or lung: Secondary | ICD-10-CM | POA: Insufficient documentation

## 2012-08-04 DIAGNOSIS — R911 Solitary pulmonary nodule: Secondary | ICD-10-CM | POA: Insufficient documentation

## 2012-08-04 DIAGNOSIS — S2239XA Fracture of one rib, unspecified side, initial encounter for closed fracture: Secondary | ICD-10-CM | POA: Insufficient documentation

## 2012-08-04 LAB — CBC WITH DIFFERENTIAL/PLATELET
Basophils Absolute: 0 10*3/uL (ref 0.0–0.1)
EOS%: 2.3 % (ref 0.0–7.0)
HGB: 14.1 g/dL (ref 11.6–15.9)
LYMPH%: 18 % (ref 14.0–49.7)
MCH: 33.9 pg (ref 25.1–34.0)
MCV: 99.3 fL (ref 79.5–101.0)
MONO%: 7.8 % (ref 0.0–14.0)
NEUT%: 71.3 % (ref 38.4–76.8)
Platelets: 217 10*3/uL (ref 145–400)
RDW: 12.2 % (ref 11.2–14.5)

## 2012-08-04 LAB — COMPREHENSIVE METABOLIC PANEL (CC13)
AST: 13 U/L (ref 5–34)
Alkaline Phosphatase: 104 U/L (ref 40–150)
BUN: 11.1 mg/dL (ref 7.0–26.0)
Creatinine: 0.7 mg/dL (ref 0.6–1.1)
Potassium: 4.6 mEq/L (ref 3.5–5.1)
Total Bilirubin: 0.32 mg/dL (ref 0.20–1.20)

## 2012-08-04 MED ORDER — IOHEXOL 300 MG/ML  SOLN
80.0000 mL | Freq: Once | INTRAMUSCULAR | Status: AC | PRN
  Administered 2012-08-04: 80 mL via INTRAVENOUS

## 2012-08-08 ENCOUNTER — Other Ambulatory Visit: Payer: Medicaid Other | Admitting: Lab

## 2012-08-08 ENCOUNTER — Telehealth: Payer: Self-pay | Admitting: Internal Medicine

## 2012-08-08 ENCOUNTER — Encounter: Payer: Self-pay | Admitting: Internal Medicine

## 2012-08-08 ENCOUNTER — Ambulatory Visit (HOSPITAL_BASED_OUTPATIENT_CLINIC_OR_DEPARTMENT_OTHER): Payer: Medicaid Other | Admitting: Internal Medicine

## 2012-08-08 ENCOUNTER — Encounter: Payer: Self-pay | Admitting: *Deleted

## 2012-08-08 NOTE — Progress Notes (Signed)
Spoke with pt and son at [facility] today.  No questions or concerns at this time

## 2012-08-08 NOTE — Progress Notes (Signed)
[facility] Telephone:([phone]) [phone]   Fax:([phone]) [phone]  OFFICE PROGRESS NOTE  [name], MD [address] [facility], [place]. [place] [address]  DIAGNOSIS: Stage IIIa (T2a, N2, MX) non-small cell lung cancer, squamous cell carcinoma diagnosed in September of 2013.   PRIOR THERAPY: Status post a course of concurrent chemoradiation with chemotherapy in the form of weekly carboplatin for AUC of 2 and paclitaxel at 45 mg per meter squared. The paclitaxel was discontinued after cycle #5 secondary to peripheral neuropathy.   CURRENT THERAPY: None  INTERVAL HISTORY: [name] 62 y.o. female returns to the clinic today for routine followup visit accompanied by her husband and daughter. She is feeling fine today with no specific complaints but she is not gaining any weight. She denied having any significant chest pain, shortness breath, cough or hemoptysis. She had some pain coming from the back of the neck with radiation to the right arm probably secondary to degenerative disc disease and previous disc surgery in this area. The patient had repeat CT scan of the chest performed recently and she is here for evaluation and discussion of her scan results.  MEDICAL HISTORY: Past Medical History  Diagnosis Date  . Acute myocardial infarction, unspecified site, episode of care unspecified   . Esophageal dysmotility   . Unspecified hemorrhoids without mention of complication   . Hemorrhoids   . Esophageal stricture   . Diaphragmatic hernia without mention of obstruction or gangrene   . 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 Coronary atherosclerosis of unspecified type of vessel, native or graft   . Peripheral neuropathy   . Other and unspecified hyperlipidemia   . Peptic ulcer, unspecified site, unspecified as acute or chronic, without mention of hemorrhage, perforation, or obstruction   . Unspecified cardiovascular disease   .  Depressive disorder, not elsewhere classified   . Anxiety state, unspecified   . COPD (chronic obstructive pulmonary disease)   . GERD (gastroesophageal reflux disease)   . Candida esophagitis     on multiple occasions  . C. difficile diarrhea   . PVD (peripheral vascular disease)   . Headache   . Weight loss 06/13/2011    scheduled here by family doctor  . CHF (congestive heart failure) 2011  . Asthma   . Hypertension   . Hx of radiation therapy 02/28/12 -04/14/12    right lung    ALLERGIES:  is allergic to amiodarone; clopidogrel bisulfate; and dilaudid.  MEDICATIONS:  Current Outpatient Prescriptions  Medication Sig Dispense Refill  . albuterol (PROVENTIL HFA;VENTOLIN HFA) 108 (90 BASE) MCG/ACT inhaler Inhale 2 puffs into the lungs every 6 (six) hours as needed. For wheezing      . aspirin 81 MG EC tablet Take 81 mg by mouth daily.        Marland Kitchen atorvastatin (LIPITOR) 80 MG tablet Take 80 mg by mouth at bedtime.      . Cholecalciferol (VITAMIN D-3) 5000 UNITS TABS Take 1 tablet by mouth daily.      Marland Kitchen ezetimibe (ZETIA) 10 MG tablet Take 10 mg by mouth daily.        . famotidine (PEPCID) 20 MG tablet Take 20 mg by mouth at bedtime.      . montelukast (SINGULAIR) 10 MG tablet Take 10 mg by mouth at bedtime.        . nebivolol (BYSTOLIC) 2.5 MG tablet Take 1 tablet (2.5 mg total) by mouth daily.  30 tablet  0  . nitroGLYCERIN (NITROSTAT) 0.4 MG SL tablet Place 0.4 mg under the tongue every 5 (five) minutes as needed. For chest pain      . omeprazole (PRILOSEC) 20 MG capsule Take 20 mg by mouth 2 (two) times daily.        . prasugrel (EFFIENT) 10 MG TABS Take 10 mg by mouth daily.       Marland Kitchen SPIRIVA HANDIHALER 18 MCG inhalation capsule PLACE 1 CAPSULE INTO INHALER AND INHALE CONTENTS ONCE DAILY  30 capsule  5  . SYMBICORT 160-4.5 MCG/ACT inhaler INHALE 2 PUFFS INTO THE LUNGS 2 TIMES DAILY.  1 Inhaler  5   No current facility-administered medications for this visit.    SURGICAL HISTORY:    Past Surgical History  Procedure Laterality Date  . Tonsillectomy  1974  . Fracture lt jaw  1976  . Breast surgery  1969, 1970    bilateral  . Epigastric hernia repair  2008    Dr Dorris Fetch  . Coronary artery bypass graft  1995  . Carpal tunnel release    . Total abdominal hysterectomy  1998  . Laminectomy    . Coronary angioplasty      multiple  . Subclavian vein angioplasty / stenting      REVIEW OF SYSTEMS:  A comprehensive review of systems was negative except for: Constitutional: positive for fatigue Musculoskeletal: positive for arthralgias   PHYSICAL EXAMINATION: General appearance: alert, cooperative and no distress Head: Normocephalic, without obvious abnormality, atraumatic Neck: no adenopathy Resp: clear to auscultation bilaterally Cardio: regular rate and rhythm, S1, S2 normal, no murmur, click, rub or gallop GI: soft, non-tender; bowel sounds normal; no masses,  no organomegaly Extremities: extremities normal, atraumatic, no cyanosis or edema  ECOG PERFORMANCE STATUS: 1 - Symptomatic but completely ambulatory  Blood pressure 109/63, pulse 67, temperature 97 F (36.1 C), temperature source Oral, resp. rate 18, height 5\' 7"  (1.702 m), weight 101 lb 4.8 oz (45.949 kg).  LABORATORY DATA: Lab Results  Component Value Date   WBC 5.3 08/04/2012   HGB 14.1 08/04/2012   HCT 41.2 08/04/2012   MCV 99.3 08/04/2012   PLT 217 08/04/2012      Chemistry      Component Value Date/Time   NA 139 08/04/2012 0839   NA 139 04/25/2012 1909   K 4.6 08/04/2012 0839   K 3.4* 04/25/2012 1909   CL 103 08/04/2012 0839   CL 103 04/25/2012 1909   CO2 30* 08/04/2012 0839   CO2 28 04/25/2012 1909   BUN 11.1 08/04/2012 0839   BUN 5* 04/25/2012 1909   CREATININE 0.7 08/04/2012 0839   CREATININE 0.47* 04/25/2012 1909      Component Value Date/Time   CALCIUM 10.0 08/04/2012 0839   CALCIUM 9.5 04/25/2012 1909   ALKPHOS 104 08/04/2012 0839   ALKPHOS 106 02/07/2012 1227   AST 13 08/04/2012 0839    AST 18 02/07/2012 1227   ALT 14 08/04/2012 0839   ALT 13 02/07/2012 1227   BILITOT 0.32 08/04/2012 0839   BILITOT 0.5 02/07/2012 1227       RADIOGRAPHIC STUDIES: Ct Chest W Contrast  08/04/2012  *RADIOLOGY REPORT*  Clinical Data: History of lung cancer.  Restaging scan.  CT CHEST WITH CONTRAST  Technique:  Multidetector CT imaging of the chest was performed following the standard protocol during bolus administration of intravenous contrast.  Contrast: 80mL OMNIPAQUE IOHEXOL 300 MG/ML  SOLN  Comparison: Multiple priors, most recently 04/25/2012.  Findings:  Mediastinum: Heart  size is normal. There is no significant pericardial fluid, thickening or pericardial calcification. There is atherosclerosis of the thoracic aorta, the great vessels of the mediastinum and the coronary arteries, including calcified atherosclerotic plaque in the left anterior descending and right coronary arteries. Status post median sternotomy for CABG with a LIMA. No pathologically enlarged mediastinal or hilar lymph nodes. Esophagus is unremarkable in appearance.  Lungs/Pleura: Compared to the prior examination there has been further decrease in size of a partially cavitary right upper lobe pulmonary nodule which currently measures only 1.8 x 0.9 cm (image 23 of series 5).  This lesion continues have spiculated margins and is associated with retraction of the adjacent right major fissure. Postobstructive changes and interstitial prominence in the superior aspect of the right upper lobe posteriorly have decreased compared to prior examinations.  A 5 mm nodule in the right lower lobe (image 36 of series 5) is unchanged in retrospect compared to prior PET CT 02/15/2012.  No other new suspicious appearing pulmonary nodules or masses are otherwise noted.  Mild diffuse bronchial wall thickening with mild centrilobular and paraseptal emphysema. Linear opacity in the left lower lobe is unchanged, most compatible with an area of mild scarring.   No acute consolidative airspace disease.  No pleural effusions.  Upper Abdomen: Atherosclerosis in the visualized vasculature.  Musculoskeletal: New healing fracture of the anterolateral aspect of the left fifth rib is nondisplaced.  Sternotomy wires.  Old compression fracture of T10 with approximately 30% loss of anterior vertebral body height is unchanged.  Orthopedic fixation hardware is noted posteriorly from T10 through the upper lumbar spine (incompletely visualized). There are no aggressive appearing lytic or blastic lesions noted in the visualized portions of the skeleton.  IMPRESSION: 1.  Today's study demonstrates continued positive response to therapy with decreased size of the primary right upper lobe pulmonary nodule which currently measures 1.8 x 0.9 cm. Mediastinal adenopathy is again no longer visualized. 2. New nondisplaced healing fracture of the anterolateral aspect of the left fifth rib.  As there are no other suspicious bony lesions, this may simply represent a benign healing fracture (there are no particular imaging features strongly suggestive of a pathologic fracture).  Continued attention on follow-up studies is recommended to exclude the possibility of metastatic disease to the bones. 3.  5 mm nodule in the right middle lobe is unchanged (image 36 of series 5) and nonspecific.  Continued attention on follow-up studies is recommended. 4.  Additional incidental findings, as above, similar to prior examinations.   Original Report Authenticated By: Trudie Reed, M.D.     ASSESSMENT: This is a very pleasant 62 years old white female with history of stage IIIa non-small cell lung cancer squamous cell carcinoma status post concurrent chemoradiation. She has been on observation for the last 6 months with no significant evidence for disease progression but actually continues to have improvement in her disease especially in the right upper lobe pulmonary nodule.   PLAN: I discussed the scan  results with the patient and her family today. I recommended for her to continue on observation with repeat CT scan of the chest in 3 months. She was advised to call me immediately if she has any concerning symptoms in the interval.  All questions were answered. The patient knows to call the clinic with any problems, questions or concerns. We can certainly see the patient much sooner if necessary.

## 2012-08-08 NOTE — Telephone Encounter (Signed)
gv and printed appt schedule for pt for June °

## 2012-08-08 NOTE — Patient Instructions (Signed)
Your CT scan of the chest showed continued improvement in your disease. Followup visit in 3 months with repeat CT scan of the chest.

## 2012-11-02 ENCOUNTER — Encounter: Payer: Self-pay | Admitting: Cardiovascular Disease

## 2012-11-02 ENCOUNTER — Ambulatory Visit (INDEPENDENT_AMBULATORY_CARE_PROVIDER_SITE_OTHER): Payer: Medicaid Other | Admitting: Cardiovascular Disease

## 2012-11-02 VITALS — BP 116/70 | HR 65 | Ht 66.0 in | Wt 96.9 lb

## 2012-11-02 DIAGNOSIS — I739 Peripheral vascular disease, unspecified: Secondary | ICD-10-CM

## 2012-11-02 DIAGNOSIS — I252 Old myocardial infarction: Secondary | ICD-10-CM

## 2012-11-02 DIAGNOSIS — I2510 Atherosclerotic heart disease of native coronary artery without angina pectoris: Secondary | ICD-10-CM

## 2012-11-02 DIAGNOSIS — I219 Acute myocardial infarction, unspecified: Secondary | ICD-10-CM

## 2012-11-02 DIAGNOSIS — E785 Hyperlipidemia, unspecified: Secondary | ICD-10-CM

## 2012-11-02 NOTE — Progress Notes (Signed)
11/02/2012 [name]   16-Jun-1950  [id number]  Primary Physician [name], MD Primary Cardiologist: Runell Gess MD [name]   HPI:  The patient is an unfortunate 62 year old, thin and frail-appearing, [name] whose husband Verdon Cummins is also a patient of mine. She is a mother of 2, grandmother to 2 grandchildren. I last saw her 6 months ago and have been taking care of her since the early 1990s. She has a history of CAD status post coronary artery bypass grafting in 1995, as well as left subclavian artery PCI and stenting by myself for subclavian steal. Her other problems include discontinued tobacco abuse January 2011, hypertension, dyslipidemia and GERD. Her last cath was performed Oct 03, 2009, revealing patent grafts. She has recently been diagnosed with non-small cell stage IIIA lung cancer diagnosed by biopsy performed by Dr. Dorris Fetch and has had chemo and radiation therapy. Dr. Shirline Frees follows her as an outpatient. She denies chest pain but has chronic shortness of breath. Her last Myoview performed 07/14/2010 was nonischemic    Current Outpatient Prescriptions  Medication Sig Dispense Refill  . albuterol (PROVENTIL HFA;VENTOLIN HFA) 108 (90 BASE) MCG/ACT inhaler Inhale 2 puffs into the lungs every 6 (six) hours as needed. For wheezing      . aspirin 81 MG EC tablet Take 81 mg by mouth daily.        Marland Kitchen atorvastatin (LIPITOR) 80 MG tablet Take 80 mg by mouth at bedtime.      . Cholecalciferol (VITAMIN D-3) 5000 UNITS TABS Take 1 tablet by mouth daily.      Marland Kitchen ezetimibe (ZETIA) 10 MG tablet Take 10 mg by mouth daily.        . famotidine (PEPCID) 20 MG tablet Take 20 mg by mouth at bedtime.      Marland Kitchen HYDROcodone-acetaminophen (NORCO) 10-325 MG per tablet Take 2 tablets by mouth every 4 (four) hours as needed for pain.      . montelukast (SINGULAIR) 10 MG tablet Take 10 mg by mouth at bedtime.        . nitroGLYCERIN (NITROSTAT) 0.4 MG SL  tablet Place 0.4 mg under the tongue every 5 (five) minutes as needed. For chest pain      . omeprazole (PRILOSEC) 20 MG capsule Take 20 mg by mouth 2 (two) times daily.        . prasugrel (EFFIENT) 10 MG TABS Take 10 mg by mouth daily.       Marland Kitchen SPIRIVA HANDIHALER 18 MCG inhalation capsule PLACE 1 CAPSULE INTO INHALER AND INHALE CONTENTS ONCE DAILY  30 capsule  5  . SYMBICORT 160-4.5 MCG/ACT inhaler INHALE 2 PUFFS INTO THE LUNGS 2 TIMES DAILY.  1 Inhaler  5  . nebivolol (BYSTOLIC) 2.5 MG tablet Take 1 tablet (2.5 mg total) by mouth daily.  30 tablet  0   No current facility-administered medications for this visit.    Allergies  Allergen Reactions  . Amiodarone     Drops HR too low  . Clopidogrel Bisulfate     REACTION: hives  . Dilaudid (Hydromorphone Hcl) Other (See Comments)    "I couldn't function for 2 days."    History   Social History  . Marital Status: Married    Spouse Name: N/A    Number of Children: N/A  . Years of Education: N/A   Occupational History  . disabled     back problems   Social History Main Topics  . Smoking status: Former Smoker --  1.50 packs/day for 30 years    Types: Cigarettes    Quit date: 05/24/2009  . Smokeless tobacco: Never Used  . Alcohol Use: No  . Drug Use: No  . Sexually Active: Not on file   Other Topics Concern  . Not on file   Social History Narrative   Married with 2 children, husband has Alzheimer's dementia, Owner of catering service, Quit smoking in 1995      Review of Systems: General: negative for chills, fever, night sweats or weight changes.  Cardiovascular: negative for chest pain, dyspnea on exertion, edema, orthopnea, palpitations, paroxysmal nocturnal dyspnea or shortness of breath Dermatological: negative for rash Respiratory: negative for cough or wheezing Urologic: negative for hematuria Abdominal: negative for nausea, vomiting, diarrhea, bright red blood per rectum, melena, or hematemesis Neurologic: negative  for visual changes, syncope, or dizziness All other systems reviewed and are otherwise negative except as noted above.    Blood pressure 116/70, pulse 65, height 5\' 6"  (1.676 m), weight 96 lb 14.4 oz (43.954 kg).  General appearance: alert and no distress Neck: no adenopathy, no carotid bruit, no JVD, supple, symmetrical, trachea midline and thyroid not enlarged, symmetric, no tenderness/mass/nodules Lungs: clear to auscultation bilaterally Heart: regular rate and rhythm, S1, S2 normal, no murmur, click, rub or gallop Extremities: extremities normal, atraumatic, no cyanosis or edema  EKG normal sinus rhythm at 65 without ST or T wave changes  ASSESSMENT AND PLAN:   ATHEROSCLEROTIC CARDIOVASCULAR DISEASE Ms. Pelphrey had coronary bypass graft in 1995 as well as left subclavian artery PTA and stenting by myself. She's had multiple catheter infections since. Her last cath was performed by Dr. Lynnea Ferrier 10/03/09 revealing patent grafts. She denies chest pain but is chronically short of breath. Her last Myoview stress test performed 07/14/10 and nonischemic.  PVD (peripheral vascular disease) Status post PTA and stenting of her left subclavian artery. She gets Doppler studies performed on occasion the last one of which was 08/14/09 revealing this to be widely patent.  HYPERLIPIDEMIA Followed by Dr. Felipa Eth, on statin therapy      Runell Gess MD [facility], [facility] 11/02/2012 10:07 AM

## 2012-11-02 NOTE — Patient Instructions (Addendum)
Your physician recommends that you schedule a follow-up appointment in: 6 months PA Your physician recommends that you schedule a follow-up appointment in: 1 year with Dr [name]

## 2012-11-02 NOTE — Assessment & Plan Note (Signed)
Followed by Dr. Felipa Eth, on statin therapy

## 2012-11-02 NOTE — Assessment & Plan Note (Signed)
Status post PTA and stenting of her left subclavian artery. She gets Doppler studies performed on occasion the last one of which was 08/14/09 revealing this to be widely patent.

## 2012-11-02 NOTE — Assessment & Plan Note (Signed)
[name] had coronary bypass graft in 1995 as well as left subclavian artery PTA and stenting by myself. She's had multiple catheter infections since. Her last cath was performed by Dr. Lynnea Ferrier 10/03/09 revealing patent grafts. She denies chest pain but is chronically short of breath. Her last Myoview stress test performed 07/14/10 and nonischemic.

## 2012-11-06 ENCOUNTER — Other Ambulatory Visit: Payer: Self-pay | Admitting: Internal Medicine

## 2012-11-06 ENCOUNTER — Other Ambulatory Visit: Payer: Self-pay | Admitting: Pulmonary Disease

## 2012-11-07 ENCOUNTER — Ambulatory Visit (HOSPITAL_COMMUNITY)
Admission: RE | Admit: 2012-11-07 | Discharge: 2012-11-07 | Disposition: A | Discharge status: Home / Self Care | Payer: Medicaid Other | Source: Ambulatory Visit | Attending: Internal Medicine | Admitting: Internal Medicine

## 2012-11-07 ENCOUNTER — Telehealth: Payer: Self-pay | Admitting: Pulmonary Disease

## 2012-11-07 ENCOUNTER — Ambulatory Visit (HOSPITAL_BASED_OUTPATIENT_CLINIC_OR_DEPARTMENT_OTHER): Payer: Medicaid Other | Admitting: Lab

## 2012-11-07 ENCOUNTER — Other Ambulatory Visit: Payer: Self-pay | Admitting: *Deleted

## 2012-11-07 DIAGNOSIS — C3410 Malignant neoplasm of upper lobe, unspecified bronchus or lung: Secondary | ICD-10-CM

## 2012-11-07 DIAGNOSIS — R091 Pleurisy: Secondary | ICD-10-CM | POA: Insufficient documentation

## 2012-11-07 LAB — COMPREHENSIVE METABOLIC PANEL (CC13)
ALT: 13 U/L (ref 0–55)
AST: 16 U/L (ref 5–34)
Albumin: 4.2 g/dL (ref 3.5–5.0)
Alkaline Phosphatase: 95 U/L (ref 40–150)
BUN: 10.3 mg/dL (ref 7.0–26.0)
CO2: 26 mEq/L (ref 22–29)
Calcium: 9.6 mg/dL (ref 8.4–10.4)
Chloride: 104 mEq/L (ref 98–107)
Creatinine: 0.7 mg/dL (ref 0.6–1.1)
Glucose: 92 mg/dl (ref 70–99)
Potassium: 4.4 mEq/L (ref 3.5–5.1)
Sodium: 139 mEq/L (ref 136–145)
Total Bilirubin: 0.59 mg/dL (ref 0.20–1.20)
Total Protein: 7.2 g/dL (ref 6.4–8.3)

## 2012-11-07 LAB — CBC WITH DIFFERENTIAL/PLATELET
BASO%: 0.6 % (ref 0.0–2.0)
LYMPH%: 17.1 % (ref 14.0–49.7)
MCHC: 34.9 g/dL (ref 31.5–36.0)
MCV: 98.6 fL (ref 79.5–101.0)
MONO%: 6.4 % (ref 0.0–14.0)
Platelets: 225 10*3/uL (ref 145–400)
RBC: 3.99 10*6/uL (ref 3.70–5.45)
RDW: 12.6 % (ref 11.2–14.5)
WBC: 7.2 10*3/uL (ref 3.9–10.3)

## 2012-11-07 MED ORDER — FIRST-DUKES MOUTHWASH MT SUSP: OROMUCOSAL | Status: DC

## 2012-11-07 MED ORDER — IOHEXOL 300 MG/ML  SOLN
80.0000 mL | Freq: Once | INTRAMUSCULAR | Status: AC | PRN
  Administered 2012-11-07: 80 mL via INTRAVENOUS

## 2012-11-07 NOTE — Telephone Encounter (Signed)
Pt has thursh from inhaler can not come in right now for an appt . Requesting magic mouth wash. Allergies  Allergen Reactions  . Amiodarone     Drops HR too low  . Clopidogrel Bisulfate     REACTION: hives  . Dilaudid (Hydromorphone Hcl) Other (See Comments)    "I couldn't function for 2 days."   Dr Kriste Basque please advise thank you

## 2012-11-07 NOTE — Telephone Encounter (Signed)
Pt returned triage's call & asked to be called back.   [name]

## 2012-11-07 NOTE — Telephone Encounter (Signed)
[id number], last OV 10-2011, needs OV. Carron Curie, CMA

## 2012-11-07 NOTE — Telephone Encounter (Signed)
Per SN: okay for MMW #4oz, 1 tsp gargle and swallow QID.  Thanks.  Called spoke with patient, advised of SN's recs as stated above. Pt verbalized her understanding and denied any questions. Rx sent to verified pharmacy. Pt aware to call if symptoms do not improve or worsen. Nothing further needed; will sign off.

## 2012-11-09 ENCOUNTER — Telehealth: Payer: Self-pay | Admitting: Internal Medicine

## 2012-11-09 ENCOUNTER — Encounter: Payer: Self-pay | Admitting: Internal Medicine

## 2012-11-09 ENCOUNTER — Ambulatory Visit (HOSPITAL_BASED_OUTPATIENT_CLINIC_OR_DEPARTMENT_OTHER): Payer: Medicaid Other | Admitting: Internal Medicine

## 2012-11-09 DIAGNOSIS — C3410 Malignant neoplasm of upper lobe, unspecified bronchus or lung: Secondary | ICD-10-CM

## 2012-11-09 NOTE — Progress Notes (Signed)
[facility] [phone]   [phone]  OFFICE PROGRESS NOTE  [name], MD [address] [facility], [place]. [place] [address]  DIAGNOSIS: Stage IIIa (T2a, N2, MX) non-small cell lung cancer, squamous cell carcinoma diagnosed in September of 2013.   PRIOR THERAPY: Status post a course of concurrent chemoradiation with chemotherapy in the form of weekly carboplatin for AUC of 2 and paclitaxel at 45 mg/m2. The paclitaxel was discontinued after cycle #5 secondary to peripheral neuropathy.   CURRENT THERAPY: None   INTERVAL HISTORY: [name] 62 y.o. female returns to the clinic today for followup visit accompanied by her husband. The patient continues to lose weight and she lost few pounds since her last visit. She mentions that she eats good. She continues to have shortness breath with exertion. She has no chest pain, cough or hemoptysis. She had repeat CT scan of the chest performed recently and she is here for evaluation and discussion of her scan results.  MEDICAL HISTORY: Past Medical History  Diagnosis Date  . Acute myocardial infarction, unspecified site, episode of care unspecified   . Esophageal dysmotility   . Unspecified hemorrhoids without mention of complication   . Hemorrhoids   . Esophageal stricture   . Diaphragmatic hernia without mention of obstruction or gangrene   . 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 Coronary atherosclerosis of unspecified type of vessel, native or graft   . Peripheral neuropathy   . Other and unspecified hyperlipidemia   . Peptic ulcer, unspecified site, unspecified as acute or chronic, without mention of hemorrhage, perforation, or obstruction   . Unspecified cardiovascular disease   . Depressive disorder, not elsewhere classified   . Anxiety state, unspecified   . COPD (chronic obstructive pulmonary disease)   . GERD (gastroesophageal reflux disease)     . Candida esophagitis     on multiple occasions  . C. difficile diarrhea   . PVD (peripheral vascular disease)   . Headache(784.0)   . Weight loss 06/13/2011    scheduled here by family doctor  . CHF (congestive heart failure) 2011  . Asthma   . Hypertension   . Hx of radiation therapy 02/28/12 -04/14/12    right lung    ALLERGIES:  is allergic to amiodarone; clopidogrel bisulfate; and dilaudid.  MEDICATIONS:  Current Outpatient Prescriptions  Medication Sig Dispense Refill  . albuterol (PROVENTIL HFA;VENTOLIN HFA) 108 (90 BASE) MCG/ACT inhaler Inhale 2 puffs into the lungs every 6 (six) hours as needed. For wheezing      . aspirin 81 MG EC tablet Take 81 mg by mouth daily.        Marland Kitchen atorvastatin (LIPITOR) 80 MG tablet Take 80 mg by mouth at bedtime.      . Cholecalciferol (VITAMIN D-3) 5000 UNITS TABS Take 1 tablet by mouth daily.      . Diphenhyd-Hydrocort-Nystatin (FIRST-DUKES MOUTHWASH) SUSP Gargle and swallow 1 tsp four times daily  240 mL  0  . ezetimibe (ZETIA) 10 MG tablet Take 10 mg by mouth daily.        . famotidine (PEPCID) 20 MG tablet Take 20 mg by mouth at bedtime.      Marland Kitchen HYDROcodone-acetaminophen (NORCO) 10-325 MG per tablet Take 2 tablets by mouth every 4 (four) hours as needed for pain.      . montelukast (SINGULAIR) 10 MG tablet Take 10 mg by mouth at bedtime.        Marland Kitchen  nebivolol (BYSTOLIC) 2.5 MG tablet Take 1 tablet (2.5 mg total) by mouth daily.  30 tablet  0  . nitroGLYCERIN (NITROSTAT) 0.4 MG SL tablet Place 0.4 mg under the tongue every 5 (five) minutes as needed. For chest pain      . omeprazole (PRILOSEC) 20 MG capsule Take 20 mg by mouth 2 (two) times daily.        . prasugrel (EFFIENT) 10 MG TABS Take 10 mg by mouth daily.       Marland Kitchen SPIRIVA HANDIHALER 18 MCG inhalation capsule PLACE 1 CAPSULE INTO INHALER AND INHALE CONTENTS ONCE DAILY  30 capsule  5  . SYMBICORT 160-4.5 MCG/ACT inhaler INHALE 2 PUFFS INTO THE LUNGS 2 TIMES DAILY.  1 Inhaler  5   No current  facility-administered medications for this visit.    SURGICAL HISTORY:  Past Surgical History  Procedure Laterality Date  . Tonsillectomy  1974  . Fracture lt jaw  1976  . Breast surgery  1969, 1970    bilateral  . Epigastric hernia repair  2008    Dr Dorris Fetch  . Coronary artery bypass graft  1995  . Carpal tunnel release    . Total abdominal hysterectomy  1998  . Laminectomy    . Coronary angioplasty      multiple  . Subclavian vein angioplasty / stenting      REVIEW OF SYSTEMS:  A comprehensive review of systems was negative except for: Constitutional: positive for weight loss Respiratory: positive for dyspnea on exertion   PHYSICAL EXAMINATION: General appearance: alert, cooperative and no distress Head: Normocephalic, without obvious abnormality, atraumatic Neck: no adenopathy Lymph nodes: Cervical, supraclavicular, and axillary nodes normal. Resp: clear to auscultation bilaterally Cardio: regular rate and rhythm, S1, S2 normal, no murmur, click, rub or gallop GI: soft, non-tender; bowel sounds normal; no masses,  no organomegaly Extremities: extremities normal, atraumatic, no cyanosis or edema  ECOG PERFORMANCE STATUS: 1 - Symptomatic but completely ambulatory  Blood pressure 84/55, pulse 68, temperature 97.3 F (36.3 C), temperature source Oral, resp. rate 18, height 5\' 6"  (1.676 m), weight 95 lb 14.4 oz (43.5 kg).  LABORATORY DATA: Lab Results  Component Value Date   WBC 7.2 11/07/2012   HGB 13.7 11/07/2012   HCT 39.3 11/07/2012   MCV 98.6 11/07/2012   PLT 225 11/07/2012      Chemistry      Component Value Date/Time   NA 139 11/07/2012 0839   NA 139 04/25/2012 1909   K 4.4 11/07/2012 0839   K 3.4* 04/25/2012 1909   CL 104 11/07/2012 0839   CL 103 04/25/2012 1909   CO2 26 11/07/2012 0839   CO2 28 04/25/2012 1909   BUN 10.3 11/07/2012 0839   BUN 5* 04/25/2012 1909   CREATININE 0.7 11/07/2012 0839   CREATININE 0.47* 04/25/2012 1909      Component Value Date/Time     CALCIUM 9.6 11/07/2012 0839   CALCIUM 9.5 04/25/2012 1909   ALKPHOS 95 11/07/2012 0839   ALKPHOS 106 02/07/2012 1227   AST 16 11/07/2012 0839   AST 18 02/07/2012 1227   ALT 13 11/07/2012 0839   ALT 13 02/07/2012 1227   BILITOT 0.59 11/07/2012 0839   BILITOT 0.5 02/07/2012 1227       RADIOGRAPHIC STUDIES: Ct Chest W Contrast  11/07/2012   *RADIOLOGY REPORT*  Clinical Data: Restaging lung cancer  CT CHEST WITH CONTRAST  Technique:  Multidetector CT imaging of the chest was performed following the standard protocol during  bolus administration of intravenous contrast.  Contrast: 80mL OMNIPAQUE IOHEXOL 300 MG/ML  SOLN  Comparison: 08/04/2012  Findings: No pleural effusion identified.  No airspace consolidation identified.  Right upper lobe spiculated lesion measures 1.3 x 0.8 cm, image 22/series 5.  This is compared with the same previously.  Mild increase in subpleural thickening and architectural distortion, images 16-19.  Within the right lower lobe there is a sub solid nodule measuring 4 mm, image 34/series 5.  This is stable from previous exam.  No new or enlarging mediastinal or hilar lymph nodes.  The heart size is normal.  There is no pericardial effusion.  No mediastinal or hilar lymph nodes identified.  There is no axillary or supraclavicular adenopathy.  Imaging through the upper abdomen is unremarkable.  No acute findings noted.  Review of the visualized osseous structures shows postsurgical changes involving the spine compatible with posterior hardware fixation.  IMPRESSION:  1.  Primary right upper lobe perihilar tumor is unchanged in the interval. 2.  There has been interval increase in architectural distortion and pleural thickening within the adjacent right upper lobe.  This may reflect changes secondary to external beam radiation.  However, close interval follow-up would be advised to rule out recurrent local tumor.   Original Report Authenticated By: Signa Kell, M.D.    ASSESSMENT AND  PLAN: this is a very pleasant 62 years old white female with history of stage IIIA non-small cell lung cancer status post concurrent chemoradiation. The patient is currently on observation with no evidence for disease progression on the recent scan. I discussed the scan results with the patient. I recommended for her to continue on observation with repeat CT scan of the chest in 3 months.  For depression and weight loss, I will start the patient on Remeron 30 mg by mouth each bedtime. The patient was advised to call immediately if she has any concerning symptoms in the interval.    All questions were answered. The patient knows to call the clinic with any problems, questions or concerns. We can certainly see the patient much sooner if necessary.

## 2012-11-09 NOTE — Telephone Encounter (Signed)
pt requested that i sent appt in mail.Marland KitchenMarland KitchenDone

## 2012-11-10 MED ORDER — MIRTAZAPINE 30 MG PO TABS: 30.0000 mg | ORAL_TABLET | Freq: Every day | ORAL | Status: DC

## 2012-11-11 NOTE — Patient Instructions (Signed)
No evidence for disease progression on his recent scan. Followup visit in 3 months with repeat CT scan of the chest. 

## 2012-11-29 ENCOUNTER — Telehealth: Payer: Self-pay | Admitting: Pulmonary Disease

## 2012-11-29 MED ORDER — PREDNISONE 10 MG PO TABS: ORAL_TABLET | ORAL | Status: DC

## 2012-11-29 NOTE — Telephone Encounter (Signed)
LMOMTCB x 1.  Pt needs f/u ov.  Prednisone rx sent.

## 2012-11-29 NOTE — Telephone Encounter (Signed)
Patient returning call.

## 2012-11-29 NOTE — Telephone Encounter (Signed)
Pt aware. RX sent. appt made. Nothing further needed

## 2012-11-29 NOTE — Telephone Encounter (Signed)
Pt c/o prod cough (yellow- green) for 3 days.  Wheezing, SOB and chest tightness also.  Denies fever.  Pt cannot come in because she doesn't have anyone to sit with her husband.  Please advise. Allergies  Allergen Reactions  . Amiodarone     Drops HR too low  . Clopidogrel Bisulfate     REACTION: hives  . Dilaudid (Hydromorphone Hcl) Other (See Comments)    "I couldn't function for 2 days."

## 2012-11-29 NOTE — Telephone Encounter (Signed)
Please send prescription for prednisone 10 mg pills >> 3 pills for 2 days, 2 pills for 2 days, 1 pill for 2 days.  Dispense 12 pills with no refills.    She is also overdue for follow up visit.  Please schedule her for follow up visit when convenient for her within next 1 month.    She should call back otherwise prior to ROV if she is not feeling better.

## 2012-12-01 ENCOUNTER — Other Ambulatory Visit: Payer: Self-pay | Admitting: Pulmonary Disease

## 2012-12-04 ENCOUNTER — Telehealth: Payer: Self-pay | Admitting: Pulmonary Disease

## 2012-12-04 NOTE — Telephone Encounter (Signed)
I spoke with pt. She stated she is still coughing up thick yellow phlem, wheezing and chest tx. She also has thrush. She called on 11/29/12 was advised if no better then needs OV. Pt is scheduled to see Dr. Craige Cotta tomorrow at 2:30 for visit. She will await until then for RX to discuss what is going on. Will sign off message

## 2012-12-05 ENCOUNTER — Encounter: Payer: Self-pay | Admitting: Pulmonary Disease

## 2012-12-05 ENCOUNTER — Ambulatory Visit (INDEPENDENT_AMBULATORY_CARE_PROVIDER_SITE_OTHER): Payer: Medicaid Other | Admitting: Pulmonary Disease

## 2012-12-05 VITALS — BP 120/70 | HR 62 | Temp 98.8°F | Ht 66.0 in | Wt 97.4 lb

## 2012-12-05 DIAGNOSIS — J438 Other emphysema: Secondary | ICD-10-CM

## 2012-12-05 DIAGNOSIS — J439 Emphysema, unspecified: Secondary | ICD-10-CM

## 2012-12-05 DIAGNOSIS — B3781 Candidal esophagitis: Secondary | ICD-10-CM

## 2012-12-05 MED ORDER — FLUCONAZOLE 100 MG PO TABS: 100.0000 mg | ORAL_TABLET | Freq: Every day | ORAL | Status: DC

## 2012-12-05 MED ORDER — CEFUROXIME AXETIL 500 MG PO TABS: 500.0000 mg | ORAL_TABLET | Freq: Two times a day (BID) | ORAL | Status: DC

## 2012-12-05 NOTE — Assessment & Plan Note (Signed)
Will give course of diflucan.

## 2012-12-05 NOTE — Progress Notes (Signed)
Chief Complaint  Patient presents with  . Acute Visit    pt c/o chest tx, wheezing,thrush and prod cough w thick dark yellow mucus x1 week took mmw and pred w improvement in sx -- sx worse in the afternoon (pt has been dx w lung ca since last visit)    History of Present Illness: [name] is a 62 y.o. female former smoker with COPD.  She has hx of NSCLC s/p chemoradiation therapy.  She has noticed more cough with yellow to green sputum for the past 3 weeks.  She took prednisone and this helped some.  She is not wheezing as much.  She has been getting white build up in her mouth and sore when swallowing.  She has tried magic mouth wash w/o help.    She is still using spiriva daily and symbicort BID.  She rinses her mouth after using symbicort.   She denies fever, sinus congestion, or hemoptysis.  She gets occasional nausea.  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 difficile diarrhea; PVD (peripheral vascular disease); Headache(784.0); Weight loss (06/13/2011); CHF (congestive heart failure)  (2011); Asthma; Hypertension; and radiation therapy (02/28/12 -04/14/12).  [name]  has past surgical history that includes Tonsillectomy (1974); Fracture lt jaw (1976); Breast surgery (1969, 1970); Epigastric hernia repair (2008); Coronary artery bypass graft (1995); Carpal tunnel release; Total abdominal hysterectomy (1998); Laminectomy; Coronary angioplasty; and Subclavian vein angioplasty / stenting.  Prior to Admission medications   Medication Sig Start Date End Date Taking? Authorizing Provider  albuterol (PROVENTIL HFA;VENTOLIN HFA) 108 (90 BASE) MCG/ACT inhaler Inhale 2 puffs into the lungs every 6 (six) hours as needed. For wheezing    Historical Provider, MD  aspirin 81 MG EC tablet Take 81 mg by mouth daily.      Historical Provider, MD  atorvastatin (LIPITOR) 80 MG tablet Take 80 mg by mouth at bedtime.    Historical Provider, MD  Cholecalciferol (VITAMIN D-3) 5000 UNITS TABS Take 1 tablet by mouth daily.    Historical Provider, MD  ezetimibe (ZETIA) 10 MG tablet Take 10 mg by mouth daily.      Historical Provider, MD  famotidine (PEPCID) 20 MG tablet Take 20 mg by mouth at bedtime.    Historical Provider, MD  HYDROcodone-acetaminophen (NORCO) 10-325 MG per tablet Take 2 tablets by mouth every 4 (four) hours as needed for pain.    Historical Provider, MD  mirtazapine (REMERON) 30 MG tablet Take 1 tablet (30 mg total) by mouth at bedtime. 11/10/12   Si Gaul, MD  montelukast (SINGULAIR) 10 MG tablet Take 10 mg by mouth at bedtime.      Historical Provider, MD  nebivolol (BYSTOLIC) 2.5 MG tablet Take 1 tablet (2.5 mg total) by mouth daily. 08/25/11 08/24/12  Julio Sicks, NP  nitroGLYCERIN (NITROSTAT) 0.4 MG SL tablet Place 0.4 mg under the tongue every 5 (five) minutes as needed. For chest pain    Historical Provider, MD  nystatin (MYCOSTATIN) 100000 UNIT/ML suspension GARGLE AND SWALLOW 1 TEASPOONFUL 4 TIMES A DAY 12/01/12   Coralyn Helling, MD  omeprazole (PRILOSEC) 20 MG  capsule Take 20 mg by mouth 2 (two) times daily.      Historical Provider, MD  prasugrel (EFFIENT) 10 MG TABS Take 10 mg by mouth daily.     Historical Provider, MD  predniSONE (DELTASONE) 10 MG tablet Take 3 tablets for 2 days, 2 tablets for 2 days then one tablet for 2 days then stop. 11/29/12   Coralyn Helling, MD  SPIRIVA HANDIHALER 18 MCG inhalation capsule PLACE 1 CAPSULE INTO INHALER AND INHALE CONTENTS ONCE DAILY 07/14/12   Coralyn Helling, MD  SYMBICORT 160-4.5 MCG/ACT inhaler INHALE 2 PUFFS INTO THE LUNGS 2 TIMES DAILY. 05/29/12   Coralyn Helling, MD    Allergies  Allergen Reactions  . Amiodarone     Drops HR too low  . Clopidogrel Bisulfate     REACTION: hives  . Dilaudid (Hydromorphone Hcl) Other (See Comments)    "I couldn't function for 2 days."     Physical Exam:  General - No distress, thin ENT - No sinus tenderness, changes of thrush, no oral exudate, no LAN Cardiac - s1s2 regular, no murmur Chest - Prolonged exhalation, no wheeze/rales/dullness Back - No focal tenderness Abd - Soft, non-tender Ext - No edema Neuro - Normal strength Skin - No rashes Psych - normal mood, and behavior   Assessment/Plan:  Coralyn Helling, MD [facility] Pager:  [phone]

## 2012-12-05 NOTE — Assessment & Plan Note (Signed)
She has mild exacerbation.  I don't think she needs additional prednisone.  Will give course of ceftin.  Reminder her to rinse mouth after using symbicort.  Will d/c singulair >> not sure this is helping much.

## 2012-12-05 NOTE — Patient Instructions (Signed)
Fluconazole 100 mg pill daily for 7 days >> do not take lipitor when using fluconazole Ceftin 500 mg pill twice daily for 5 days Stop singulair Call if not feeling better Follow up in 6 months

## 2012-12-26 ENCOUNTER — Ambulatory Visit: Payer: Medicaid Other | Admitting: Pulmonary Disease

## 2013-01-01 ENCOUNTER — Other Ambulatory Visit: Payer: Self-pay | Admitting: Pulmonary Disease

## 2013-01-11 ENCOUNTER — Emergency Department (HOSPITAL_COMMUNITY): Payer: Medicaid Other

## 2013-01-11 ENCOUNTER — Inpatient Hospital Stay (HOSPITAL_COMMUNITY)
Admission: EM | Admit: 2013-01-11 | Discharge: 2013-01-13 | DRG: 246 | Disposition: A | Discharge status: Home / Self Care | Payer: Medicaid Other | Attending: Cardiovascular Disease | Admitting: Cardiovascular Disease

## 2013-01-11 ENCOUNTER — Encounter (HOSPITAL_COMMUNITY): Payer: Self-pay

## 2013-01-11 DIAGNOSIS — R64 Cachexia: Secondary | ICD-10-CM | POA: Diagnosis present

## 2013-01-11 DIAGNOSIS — G629 Polyneuropathy, unspecified: Secondary | ICD-10-CM | POA: Diagnosis present

## 2013-01-11 DIAGNOSIS — Z7902 Long term (current) use of antithrombotics/antiplatelets: Secondary | ICD-10-CM

## 2013-01-11 DIAGNOSIS — F329 Major depressive disorder, single episode, unspecified: Secondary | ICD-10-CM | POA: Diagnosis present

## 2013-01-11 DIAGNOSIS — Z923 Personal history of irradiation: Secondary | ICD-10-CM

## 2013-01-11 DIAGNOSIS — I219 Acute myocardial infarction, unspecified: Secondary | ICD-10-CM

## 2013-01-11 DIAGNOSIS — I739 Peripheral vascular disease, unspecified: Secondary | ICD-10-CM

## 2013-01-11 DIAGNOSIS — Z79899 Other long term (current) drug therapy: Secondary | ICD-10-CM

## 2013-01-11 DIAGNOSIS — I2510 Atherosclerotic heart disease of native coronary artery without angina pectoris: Secondary | ICD-10-CM

## 2013-01-11 DIAGNOSIS — Z7982 Long term (current) use of aspirin: Secondary | ICD-10-CM

## 2013-01-11 DIAGNOSIS — Z8674 Personal history of sudden cardiac arrest: Secondary | ICD-10-CM

## 2013-01-11 DIAGNOSIS — I214 Non-ST elevation (NSTEMI) myocardial infarction: Secondary | ICD-10-CM

## 2013-01-11 DIAGNOSIS — I200 Unstable angina: Secondary | ICD-10-CM

## 2013-01-11 DIAGNOSIS — Z955 Presence of coronary angioplasty implant and graft: Secondary | ICD-10-CM

## 2013-01-11 DIAGNOSIS — K219 Gastro-esophageal reflux disease without esophagitis: Secondary | ICD-10-CM | POA: Diagnosis present

## 2013-01-11 DIAGNOSIS — F3289 Other specified depressive episodes: Secondary | ICD-10-CM | POA: Diagnosis present

## 2013-01-11 DIAGNOSIS — Z681 Body mass index (BMI) 19 or less, adult: Secondary | ICD-10-CM

## 2013-01-11 DIAGNOSIS — Z9221 Personal history of antineoplastic chemotherapy: Secondary | ICD-10-CM

## 2013-01-11 DIAGNOSIS — I255 Ischemic cardiomyopathy: Secondary | ICD-10-CM

## 2013-01-11 DIAGNOSIS — E43 Unspecified severe protein-calorie malnutrition: Secondary | ICD-10-CM | POA: Diagnosis present

## 2013-01-11 DIAGNOSIS — J438 Other emphysema: Secondary | ICD-10-CM | POA: Diagnosis present

## 2013-01-11 DIAGNOSIS — R634 Abnormal weight loss: Secondary | ICD-10-CM | POA: Diagnosis present

## 2013-01-11 DIAGNOSIS — G609 Hereditary and idiopathic neuropathy, unspecified: Secondary | ICD-10-CM | POA: Diagnosis present

## 2013-01-11 DIAGNOSIS — I252 Old myocardial infarction: Secondary | ICD-10-CM

## 2013-01-11 DIAGNOSIS — F411 Generalized anxiety disorder: Secondary | ICD-10-CM | POA: Diagnosis present

## 2013-01-11 DIAGNOSIS — Z825 Family history of asthma and other chronic lower respiratory diseases: Secondary | ICD-10-CM

## 2013-01-11 DIAGNOSIS — Z8249 Family history of ischemic heart disease and other diseases of the circulatory system: Secondary | ICD-10-CM

## 2013-01-11 DIAGNOSIS — IMO0002 Reserved for concepts with insufficient information to code with codable children: Secondary | ICD-10-CM | POA: Diagnosis present

## 2013-01-11 DIAGNOSIS — K279 Peptic ulcer, site unspecified, unspecified as acute or chronic, without hemorrhage or perforation: Secondary | ICD-10-CM | POA: Diagnosis present

## 2013-01-11 DIAGNOSIS — I25810 Atherosclerosis of coronary artery bypass graft(s) without angina pectoris: Principal | ICD-10-CM

## 2013-01-11 DIAGNOSIS — I10 Essential (primary) hypertension: Secondary | ICD-10-CM | POA: Diagnosis present

## 2013-01-11 DIAGNOSIS — E785 Hyperlipidemia, unspecified: Secondary | ICD-10-CM | POA: Diagnosis present

## 2013-01-11 DIAGNOSIS — I2589 Other forms of chronic ischemic heart disease: Secondary | ICD-10-CM | POA: Diagnosis present

## 2013-01-11 DIAGNOSIS — Z87891 Personal history of nicotine dependence: Secondary | ICD-10-CM

## 2013-01-11 DIAGNOSIS — Z9861 Coronary angioplasty status: Secondary | ICD-10-CM

## 2013-01-11 DIAGNOSIS — J449 Chronic obstructive pulmonary disease, unspecified: Secondary | ICD-10-CM | POA: Diagnosis present

## 2013-01-11 DIAGNOSIS — Z8711 Personal history of peptic ulcer disease: Secondary | ICD-10-CM

## 2013-01-11 DIAGNOSIS — F32A Depression, unspecified: Secondary | ICD-10-CM | POA: Diagnosis present

## 2013-01-11 DIAGNOSIS — I509 Heart failure, unspecified: Secondary | ICD-10-CM | POA: Diagnosis present

## 2013-01-11 DIAGNOSIS — Z801 Family history of malignant neoplasm of trachea, bronchus and lung: Secondary | ICD-10-CM

## 2013-01-11 DIAGNOSIS — C3490 Malignant neoplasm of unspecified part of unspecified bronchus or lung: Secondary | ICD-10-CM | POA: Diagnosis present

## 2013-01-11 DIAGNOSIS — Z888 Allergy status to other drugs, medicaments and biological substances status: Secondary | ICD-10-CM

## 2013-01-11 HISTORY — DX: Atherosclerotic heart disease of native coronary artery without angina pectoris: I25.10

## 2013-01-11 LAB — BASIC METABOLIC PANEL
Calcium: 9.1 mg/dL (ref 8.4–10.5)
Creatinine, Ser: 0.51 mg/dL (ref 0.50–1.10)
GFR calc Af Amer: >90 mL/min (ref >90)
GFR calc non Af Amer: >90 mL/min (ref >90)

## 2013-01-11 LAB — CBC
MCH: 33.2 pg (ref 26.0–34.0)
MCHC: 34.6 g/dL (ref 30.0–36.0)
MCV: 96 fL (ref 78.0–100.0)
Platelets: 203 10*3/uL (ref 150–400)
RDW: 12.4 % (ref 11.5–15.5)

## 2013-01-11 LAB — TROPONIN I
Troponin I: <0.3 ng/mL (ref <0.30)
Troponin I: <0.3 ng/mL (ref <0.30)

## 2013-01-11 LAB — HEPARIN LEVEL (UNFRACTIONATED): Heparin Unfractionated: 0.13 IU/mL — ABNORMAL LOW (ref 0.30–0.70)

## 2013-01-11 LAB — POCT I-STAT TROPONIN I: Troponin i, poc: 0.19 ng/mL (ref 0.00–0.08)

## 2013-01-11 MED ORDER — SODIUM CHLORIDE 0.9 % IV SOLN: 250.0000 mL | INTRAVENOUS | Status: DC | PRN

## 2013-01-11 MED ORDER — HEPARIN (PORCINE) IN NACL 100-0.45 UNIT/ML-% IJ SOLN
500.0000 [IU]/h | INTRAMUSCULAR | Status: DC
  Administered 2013-01-11: 500 [IU]/h via INTRAVENOUS
  Filled 2013-01-11: qty 250

## 2013-01-11 MED ORDER — TRAMADOL HCL 50 MG PO TABS
50.0000 mg | ORAL_TABLET | Freq: Four times a day (QID) | ORAL | Status: DC | PRN
Start: 2013-01-11 — End: 2013-01-13

## 2013-01-11 MED ORDER — EZETIMIBE 10 MG PO TABS
10.0000 mg | ORAL_TABLET | Freq: Every day | ORAL | Status: DC
  Administered 2013-01-11: 10 mg via ORAL
  Filled 2013-01-11 (×3): qty 1

## 2013-01-11 MED ORDER — HEPARIN (PORCINE) IN NACL 100-0.45 UNIT/ML-% IJ SOLN
850.0000 [IU]/h | INTRAMUSCULAR | Status: DC
  Administered 2013-01-11: 650 [IU]/h via INTRAVENOUS
  Filled 2013-01-11: qty 250

## 2013-01-11 MED ORDER — PRASUGREL HCL 10 MG PO TABS
10.0000 mg | ORAL_TABLET | Freq: Every day | ORAL | Status: DC
  Administered 2013-01-11: 10 mg via ORAL
  Filled 2013-01-11 (×3): qty 1

## 2013-01-11 MED ORDER — DIAZEPAM 5 MG PO TABS: 5.0000 mg | ORAL_TABLET | ORAL | Status: DC

## 2013-01-11 MED ORDER — ACETAMINOPHEN 325 MG PO TABS: 650.0000 mg | ORAL_TABLET | ORAL | Status: DC | PRN

## 2013-01-11 MED ORDER — SODIUM CHLORIDE 0.9 % IJ SOLN
3.0000 mL | Freq: Two times a day (BID) | INTRAMUSCULAR | Status: DC
  Administered 2013-01-11: 3 mL via INTRAVENOUS

## 2013-01-11 MED ORDER — VITAMIN D3 25 MCG (1000 UNIT) PO TABS
5000.0000 [IU] | ORAL_TABLET | Freq: Every day | ORAL | Status: DC
  Administered 2013-01-11: 5000 [IU] via ORAL
  Filled 2013-01-11 (×3): qty 5

## 2013-01-11 MED ORDER — ATORVASTATIN CALCIUM 80 MG PO TABS
80.0000 mg | ORAL_TABLET | Freq: Every day | ORAL | Status: DC
  Administered 2013-01-11 – 2013-01-12 (×2): 80 mg via ORAL
  Filled 2013-01-11 (×3): qty 1

## 2013-01-11 MED ORDER — ALBUTEROL SULFATE HFA 108 (90 BASE) MCG/ACT IN AERS: 1.0000 | INHALATION_SPRAY | Freq: Four times a day (QID) | RESPIRATORY_TRACT | Status: DC | PRN

## 2013-01-11 MED ORDER — SODIUM CHLORIDE 0.9 % IJ SOLN: 3.0000 mL | INTRAMUSCULAR | Status: DC | PRN

## 2013-01-11 MED ORDER — VITAMIN D-3 125 MCG (5000 UT) PO TABS: 1.0000 | ORAL_TABLET | Freq: Every day | ORAL | Status: DC

## 2013-01-11 MED ORDER — NITROGLYCERIN IN D5W 200-5 MCG/ML-% IV SOLN
3.0000 ug/min | Freq: Once | INTRAVENOUS | Status: AC
  Administered 2013-01-11: 3 ug/min via INTRAVENOUS

## 2013-01-11 MED ORDER — HYDROCODONE-ACETAMINOPHEN 10-325 MG PO TABS
2.0000 | ORAL_TABLET | ORAL | Status: DC | PRN
  Administered 2013-01-11 – 2013-01-13 (×4): 2 via ORAL
  Filled 2013-01-11 (×4): qty 2

## 2013-01-11 MED ORDER — ZOLPIDEM TARTRATE 5 MG PO TABS: 5.0000 mg | ORAL_TABLET | Freq: Every evening | ORAL | Status: DC | PRN

## 2013-01-11 MED ORDER — HEPARIN BOLUS VIA INFUSION
2000.0000 [IU] | Freq: Once | INTRAVENOUS | Status: AC
  Administered 2013-01-11: 2000 [IU] via INTRAVENOUS

## 2013-01-11 MED ORDER — ASPIRIN 81 MG PO TBEC: 81.0000 mg | DELAYED_RELEASE_TABLET | Freq: Every day | ORAL | Status: DC

## 2013-01-11 MED ORDER — TIOTROPIUM BROMIDE MONOHYDRATE 18 MCG IN CAPS
18.0000 ug | ORAL_CAPSULE | Freq: Every day | RESPIRATORY_TRACT | Status: DC
  Filled 2013-01-11: qty 5

## 2013-01-11 MED ORDER — NITROGLYCERIN IN D5W 200-5 MCG/ML-% IV SOLN
3.0000 ug/min | INTRAVENOUS | Status: DC
  Administered 2013-01-11: 3 ug/min via INTRAVENOUS

## 2013-01-11 MED ORDER — ASPIRIN EC 81 MG PO TBEC
81.0000 mg | DELAYED_RELEASE_TABLET | Freq: Every day | ORAL | Status: DC
  Filled 2013-01-11 (×3): qty 1

## 2013-01-11 MED ORDER — BUDESONIDE-FORMOTEROL FUMARATE 160-4.5 MCG/ACT IN AERO
2.0000 | INHALATION_SPRAY | Freq: Two times a day (BID) | RESPIRATORY_TRACT | Status: DC
  Administered 2013-01-11 – 2013-01-12 (×2): 2 via RESPIRATORY_TRACT
  Filled 2013-01-11: qty 6

## 2013-01-11 MED ORDER — NITROGLYCERIN 2 % TD OINT
0.5000 [in_us] | TOPICAL_OINTMENT | Freq: Three times a day (TID) | TRANSDERMAL | Status: DC
  Filled 2013-01-11: qty 30

## 2013-01-11 MED ORDER — MORPHINE SULFATE 2 MG/ML IJ SOLN
2.0000 mg | INTRAMUSCULAR | Status: DC | PRN
  Administered 2013-01-11 – 2013-01-12 (×5): 2 mg via INTRAVENOUS
  Filled 2013-01-11 (×4): qty 1

## 2013-01-11 MED ORDER — PANTOPRAZOLE SODIUM 40 MG PO TBEC
40.0000 mg | DELAYED_RELEASE_TABLET | Freq: Every day | ORAL | Status: DC
  Administered 2013-01-11: 40 mg via ORAL
  Filled 2013-01-11: qty 1

## 2013-01-11 MED ORDER — ALPRAZOLAM 0.25 MG PO TABS: 0.2500 mg | ORAL_TABLET | Freq: Three times a day (TID) | ORAL | Status: DC | PRN

## 2013-01-11 MED ORDER — NITROGLYCERIN IN D5W 200-5 MCG/ML-% IV SOLN
INTRAVENOUS | Status: AC
  Administered 2013-01-11: 12:00:00
  Filled 2013-01-11: qty 250

## 2013-01-11 MED ORDER — ENSURE COMPLETE PO LIQD
237.0000 mL | Freq: Three times a day (TID) | ORAL | Status: DC
  Administered 2013-01-11 – 2013-01-12 (×3): 237 mL via ORAL
  Filled 2013-01-11 (×7): qty 237

## 2013-01-11 MED ORDER — SODIUM CHLORIDE 0.9 % IV SOLN
INTRAVENOUS | Status: DC
  Administered 2013-01-11: 20:00:00 via INTRAVENOUS

## 2013-01-11 MED ORDER — ONDANSETRON HCL 4 MG/2ML IJ SOLN: 4.0000 mg | Freq: Four times a day (QID) | INTRAMUSCULAR | Status: DC | PRN

## 2013-01-11 MED ORDER — NITROGLYCERIN 0.4 MG SL SUBL: 0.4000 mg | SUBLINGUAL_TABLET | SUBLINGUAL | Status: DC | PRN

## 2013-01-11 NOTE — Progress Notes (Signed)
INITIAL NUTRITION ASSESSMENT  DOCUMENTATION CODES Per approved criteria  -Severe malnutrition in the context of chronic illness   INTERVENTION: 1.  Supplements; Ensure Complete po TID, each supplement provides 350 kcal and 13 grams of protein. 2.  General healthful diet; encouraged intake as able.  Discussed high-calorie, high-protein foods  NUTRITION DIAGNOSIS: Malnutrition related to poor appetite, chronic illness as evidenced by physical exam.   Monitor:  1.  Food/Beverage; pt meeting >/=90% estimated needs with tolerance. 2.  Wt/wt change; monitor trends  Reason for Assessment: MST  62 y.o. female  Admitting Dx: NSTEMI (non-ST elevated myocardial infarction)  ASSESSMENT: Pt admitted with NSTEMI.  Pt with complex cardiac and oncology medical hx.  Pt finished radiation for lung cancer about 1 year ago.  Pt states her usual wt prior to back surgery 2 years ago was 122 lbs.  Since then, she has had multiple medical problems included cancer and associated treatment which resulted in 25 lbs wt loss.  Pt states she has been as low at 87 lbs, and recently has been able to gain wt to 97 lbs.   Pt endorses poor appetite.  The staple of her diet is Ensure Complete 3 times daily.  She has consumed up to 5 cans per day at a time, however 3 is currently her max.  In addition she typically will eat 1-2 bacon and tomato sandwiches per day and a potato-based starch with some meat for dinner.   Nutrition Focused Physical Exam:  Subcutaneous Fat:  Orbital Region: severe Upper Arm Region: moderate Thoracic and Lumbar Region: severe  Muscle:  Temple Region: severe Clavicle Bone Region: severe Clavicle and Acromion Bone Region: severe Scapular Bone Region: severe Dorsal Hand: wnl Patellar Region: severe Anterior Thigh Region: severe Posterior Calf Region: moderate  Edema: none present  Pt meets criteria for severe MALNUTRITION in the context of chronic illness as evidenced by evidence of  subcutaneous fat and muscle wasting identified on exam.   Height: Ht Readings from Last 1 Encounters:  12/05/12 5\' 6"  (1.676 m)    Weight: Wt Readings from Last 1 Encounters:  12/05/12 97 lb 6.4 oz (44.18 kg)    Ideal Body Weight: 130 lbs  % Ideal Body Weight: 74%  Wt Readings from Last 10 Encounters:  12/05/12 97 lb 6.4 oz (44.18 kg)  11/09/12 95 lb 14.4 oz (43.5 kg)  11/02/12 96 lb 14.4 oz (43.954 kg)  08/08/12 101 lb 4.8 oz (45.949 kg)  05/08/12 101 lb 4.8 oz (45.949 kg)  04/27/12 102 lb 14.4 oz (46.675 kg)  04/14/12 101 lb 6.4 oz (45.995 kg)  04/10/12 103 lb 6.4 oz (46.902 kg)  04/07/12 104 lb 11.2 oz (47.492 kg)  04/04/12 103 lb 3.2 oz (46.811 kg)    Usual Body Weight: 102 lbs  % Usual Body Weight: 95%  BMI:  There is no weight on file to calculate BMI.  Estimated Nutritional Needs: Kcal: 1320-1500 Protein: 66-75g Fluid: ~1.5 L/day  Skin: intact  Diet Order: Cardiac  EDUCATION NEEDS: -Education needs addressed   Intake/Output Summary (Last 24 hours) at 01/11/13 1507 Last data filed at 01/11/13 1404  Gross per 24 hour  Intake  391.8 ml  Output      0 ml  Net  391.8 ml    Last BM: PTA  Labs:   Recent Labs Lab 01/11/13 0521  NA 137  K 3.5  CL 103  CO2 25  BUN 10  CREATININE 0.51  CALCIUM 9.1  GLUCOSE 101*  CBG (last 3)  No results found for this basename: GLUCAP,  in the last 72 hours  Scheduled Meds: . aspirin EC  81 mg Oral Daily  . atorvastatin  80 mg Oral QHS  . budesonide-formoterol  2 puff Inhalation BID  . cholecalciferol  5,000 Units Oral Daily  . diazepam  5 mg Oral On Call  . ezetimibe  10 mg Oral Daily  . nitroGLYCERIN  0.5 inch Topical Q8H  . pantoprazole  40 mg Oral Daily  . prasugrel  10 mg Oral Daily  . sodium chloride  3 mL Intravenous Q12H  . tiotropium  18 mcg Inhalation Daily    Continuous Infusions: . sodium chloride    . heparin 500 Units/hr (01/11/13 1056)  . nitroGLYCERIN 3 mcg/min (01/11/13  1404)    Past Medical History  Diagnosis Date  . Acute myocardial infarction, unspecified site, episode of care unspecified   . Esophageal dysmotility   . Hemorrhoids   . Esophageal stricture   . Diaphragmatic hernia without mention of obstruction or gangrene   . 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 CAD (coronary artery disease)     CABG'96.  . Peripheral neuropathy   . Other and unspecified hyperlipidemia   . Peptic ulcer, unspecified site, unspecified as acute or chronic, without mention of hemorrhage, perforation, or obstruction   . Depressive disorder, not elsewhere classified   . Anxiety state, unspecified   . COPD (chronic obstructive pulmonary disease)   . GERD (gastroesophageal reflux disease)   . Candida esophagitis     on multiple occasions  . C. difficile diarrhea   . PVD (peripheral vascular disease)   . Headache(784.0)   . Weight loss 06/13/2011    scheduled here by family doctor  . CHF (congestive heart failure) 2011  . Asthma   . Hypertension   . Hx of radiation therapy 02/28/12 -04/14/12    right lung    Past Surgical History  Procedure Laterality Date  . Tonsillectomy  1974  . Fracture lt jaw  1976  . Breast surgery  1969, 1970    bilateral  . Epigastric hernia repair  2008    Dr Dorris Fetch  . Coronary artery bypass graft  1995  . Carpal tunnel release    . Total abdominal hysterectomy  1998  . Laminectomy    . Coronary angioplasty      multiple  . Subclavian vein angioplasty / stenting      Loyce Dys, MS RD LDN Clinical Inpatient Dietitian Pager: [phone] Weekend/After hours pager: [phone]

## 2013-01-11 NOTE — ED Notes (Signed)
Waiting for Heparin to come from Pharmacy.

## 2013-01-11 NOTE — H&P (Signed)
Patient ID: [name] MRN: [id number], DOB/AGE: 1950/09/22   Admit date: 01/11/2013   Primary Physician: [name], MD Primary Cardiologist: Dr [name]  HPI:   The patient is an unfortunate 62 year old, thin and frail-appearing, [name] whose husband [name] is also a patient of Dr Hazle Coca. She is a mother of 2, grandmother to 2 grandchildren. Dr [name] has been taking care of her since the early 1990s. She has a history of CAD status post coronary artery bypass grafting in 1995, as well as left subclavian artery PCI and stenting  for subclavian steal. Her other problems include discontinued tobacco abuse January 2011, hypertension, dyslipidemia and GERD. Her last cath was performed Oct 03, 2009, revealing patent grafts. She has recently been diagnosed with non-small cell stage IIIA lung cancer diagnosed by biopsy performed by Dr. Dorris Fetch and has had chemo and radiation therapy. Dr. Shirline Frees follows her as an outpatient. Her last Myoview performed 07/14/2010 was nonischemic. She last saw Dr [name] in June.          She presents to the ER today with throat pain. She says it started as "burning" in her throat that radiated to her jaw. She desShe says she had an episode of this yesterday around 1 pm that was relieved with NTG. At 2 am today she had recurrent  pain partially relieved with NTG X 2. She came to the ER and is now pain free. She wants to go home.     Problem List: Past Medical History  Diagnosis Date  . Acute myocardial infarction, unspecified site, episode of care unspecified   . Esophageal dysmotility   . Hemorrhoids   . Esophageal stricture   . Diaphragmatic hernia without mention of obstruction or gangrene   . 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 CAD (coronary artery disease)     CABG'96.  . Peripheral neuropathy   . Other and unspecified hyperlipidemia   . Peptic ulcer, unspecified site, unspecified as acute or chronic, without  mention of hemorrhage, perforation, or obstruction   . Depressive disorder, not elsewhere classified   . Anxiety state, unspecified   . COPD (chronic obstructive pulmonary disease)   . GERD (gastroesophageal reflux disease)   . Candida esophagitis     on multiple occasions  . C. difficile diarrhea   . PVD (peripheral vascular disease)   . Headache(784.0)   . Weight loss 06/13/2011    scheduled here by family doctor  . CHF (congestive heart failure) 2011  . Asthma   . Hypertension   . Hx of radiation therapy 02/28/12 -04/14/12    right lung    Past Surgical History  Procedure Laterality Date  . Tonsillectomy  1974  . Fracture lt jaw  1976  . Breast surgery  1969, 1970    bilateral  . Epigastric hernia repair  2008    Dr Dorris Fetch  . Coronary artery bypass graft  1995  . Carpal tunnel release    . Total abdominal hysterectomy  1998  . Laminectomy    . Coronary angioplasty      multiple  . Subclavian vein angioplasty / stenting       Allergies:  Allergies  Allergen Reactions  . Amiodarone     Drops HR too low  . Clopidogrel Bisulfate     REACTION: hives  . Dilaudid [Hydromorphone Hcl] Other (See Comments)    "I couldn't function for 2 days."     Home Medications Current Facility-Administered Medications  Medication Dose Route Frequency Provider Last Rate Last Dose  . heparin ADULT infusion 100 units/mL (25000 units/250 mL)  500 Units/hr Intravenous Continuous Herby Abraham, RPH      . heparin bolus via infusion 2,000 Units  2,000 Units Intravenous Once Herby Abraham, RPH      . morphine 2 MG/ML injection 2 mg  2 mg Intravenous Q2H PRN Olivia Mackie, MD   2 mg at 01/11/13 8119   Current Outpatient Prescriptions  Medication Sig Dispense Refill  . albuterol (PROVENTIL HFA;VENTOLIN HFA) 108 (90 BASE) MCG/ACT inhaler Inhale 2 puffs into the lungs every 6 (six) hours as needed. For wheezing      . aspirin 81 MG EC tablet Take 81 mg by mouth daily.        Marland Kitchen  atorvastatin (LIPITOR) 80 MG tablet Take 80 mg by mouth at bedtime.      . Cholecalciferol (VITAMIN D-3) 5000 UNITS TABS Take 1 tablet by mouth daily.      Marland Kitchen ezetimibe (ZETIA) 10 MG tablet Take 10 mg by mouth daily.        . famotidine (PEPCID) 20 MG tablet Take 20 mg by mouth at bedtime.      Marland Kitchen HYDROcodone-acetaminophen (NORCO) 10-325 MG per tablet Take 2 tablets by mouth every 4 (four) hours as needed for pain.      . nitroGLYCERIN (NITROSTAT) 0.4 MG SL tablet Place 0.4 mg under the tongue every 5 (five) minutes as needed. For chest pain      . omeprazole (PRILOSEC) 20 MG capsule Take 20 mg by mouth 2 (two) times daily.        . prasugrel (EFFIENT) 10 MG TABS Take 10 mg by mouth daily.       Marland Kitchen SPIRIVA HANDIHALER 18 MCG inhalation capsule PLACE 1 CAPSULE INTO INHALER AND INHALE CONTENTS ONCE DAILY  30 capsule  5  . SYMBICORT 160-4.5 MCG/ACT inhaler INHALE 2 PUFFS INTO THE LUNGS 2 TIMES DAILY.  1 Inhaler  5     Family History  Problem Relation Age of Onset  . Lung cancer Father   . Heart disease Father   . Asthma Father   . Allergies Father   . Cancer Father   . Heart disease Mother   . Cancer Mother   . Heart disease Sister   . Colon cancer Neg Hx   . Cancer Brother      History   Social History  . Marital Status: Married    Spouse Name: N/A    Number of Children: N/A  . Years of Education: N/A   Occupational History  . disabled     back problems   Social History Main Topics  . Smoking status: Former Smoker -- 1.50 packs/day for 30 years    Types: Cigarettes    Quit date: 05/24/2009  . Smokeless tobacco: Never Used  . Alcohol Use: No  . Drug Use: No  . Sexual Activity: Not on file   Other Topics Concern  . Not on file   Social History Narrative   Married with 2 children, husband has Alzheimer's dementia, Owner of catering service, Quit smoking in 1995      Review of Systems: General: negative for chills, fever, night sweats or weight changes. She has had  weight loss but says her diet is good. She was seen by Dr Arbutus Ped in June, [facility] then [facility], continued surveillance recommended. Cardiovascular: negative edema, orthopnea, palpitations, paroxysmal nocturnal dyspnea. She has chronic DOE  Dermatological: negative for rash Respiratory: negative for cough or wheezing Urologic: negative for hematuria Abdominal: negative for nausea, vomiting, diarrhea, bright red blood per rectum, melena, or hematemesis Neurologic: negative for visual changes, syncope, or dizziness All other systems reviewed and are otherwise negative except as noted above.  Physical Exam: Blood pressure 100/61, pulse 60, temperature 98.4 F (36.9 C), temperature source Oral, resp. rate 15, SpO2 99.00%.  General appearance: alert, cooperative, cachectic and no distress Neck: no carotid bruit and no JVD Lungs: decreased breath sounds Heart: regular rate and rhythm Abdomen: soft, non-tender; bowel sounds normal; no masses,  no organomegaly Extremities: no edema Pulses: diminnished Skin: cool and dry Neurologic: Grossly normal    Labs:   Results for orders placed during the hospital encounter of 01/11/13 (from the past 24 hour(s))  CBC     Status: None   Collection Time    01/11/13  5:21 AM      Result Value Range   WBC 7.2  4.0 - 10.5 K/uL   RBC 3.98  3.87 - 5.11 MIL/uL   Hemoglobin 13.2  12.0 - 15.0 g/dL   HCT 95.6  21.3 - 08.6 %   MCV 96.0  78.0 - 100.0 fL   MCH 33.2  26.0 - 34.0 pg   MCHC 34.6  30.0 - 36.0 g/dL   RDW 57.8  46.9 - 62.9 %   Platelets 203  150 - 400 K/uL  BASIC METABOLIC PANEL     Status: Abnormal   Collection Time    01/11/13  5:21 AM      Result Value Range   Sodium 137  135 - 145 mEq/L   Potassium 3.5  3.5 - 5.1 mEq/L   Chloride 103  96 - 112 mEq/L   CO2 25  19 - 32 mEq/L   Glucose, Bld 101 (*) 70 - 99 mg/dL   BUN 10  6 - 23 mg/dL   Creatinine, Ser 5.28  0.50 - 1.10 mg/dL   Calcium 9.1  8.4 - 41.3 mg/dL   GFR calc non Af Amer >90  >90 mL/min    GFR calc Af Amer >90  >90 mL/min  TROPONIN I     Status: None   Collection Time    01/11/13  5:21 AM      Result Value Range   Troponin I <0.30  <0.30 ng/mL  POCT I-STAT TROPONIN I     Status: Abnormal   Collection Time    01/11/13  5:26 AM      Result Value Range   Troponin i, poc 0.19 (*) 0.00 - 0.08 ng/mL   Comment NOTIFIED PHYSICIAN     Comment 3              Radiology/Studies: Dg Chest Port 1 View  01/11/2013   *RADIOLOGY REPORT*  Clinical Data: Chest pain, shortness of breath  PORTABLE CHEST - 1 VIEW  Comparison: Prior CT from 11/07/12.  Findings: Cardiac and mediastinal silhouettes are stable.  Median sternotomy wires of underlying CABG markers and surgical clips are again noted.  Extensive spinal fixation hardware involving the thoracolumbar spine is partially visualized.  The lungs are normally inflated.  No airspace consolidation, pleural effusion, or pulmonary edema is identified.  There is segmental atelectasis within the left lung base.  No acute osseous abnormality.  IMPRESSION: Segmental left lower lobe atelectasis.  Otherwise stable appearance of the chest with no acute cardiopulmonary process.   Original Report Authenticated By: Rise Mu, M.D.  EKG: NSR without acute changes  ASSESSMENT AND PLAN:   Principal Problem:   NSTEMI    CAD- CABG '96. 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  Dr [name] to see. Add nitrate and Heparin.    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  I recently saw her in the office and she had been stable. She describes SSCP radiating to Jaw and Arm since yesterday, off and on all night. This was NTG responsive. Currently pain free. Exam benign. POCM Trop + .19. EKG shows subtle inferior ST depression. Will admit to 2900. ROMI. IV NTG/Hep. Will need to be re cathed tomorrow.   Runell Gess 01/11/2013 11:31 AM

## 2013-01-11 NOTE — Progress Notes (Signed)
ANTICOAGULATION CONSULT NOTE  Pharmacy Consult for heparin Indication: chest pain/ACS  Allergies  Allergen Reactions  . Amiodarone     Drops HR too low  . Clopidogrel Bisulfate     REACTION: hives  . Dilaudid [Hydromorphone Hcl] Other (See Comments)    "I couldn't function for 2 days."    Patient Measurements: Height: 5\' 6"  (167.6 cm) Weight: 93 lb 7.6 oz (42.4 kg) IBW/kg (Calculated) : 59.3  Weight: 44.6 kg on 12/04/12  Vital Signs: Temp: 98.9 F (37.2 C) (08/21 1602) Temp src: Oral (08/21 1602) BP: 89/41 mmHg (08/21 1500) Pulse Rate: 58 (08/21 1500)  Labs:  Recent Labs  01/11/13 0521 01/11/13 1001 01/11/13 1508  HGB 13.2  --   --   HCT 38.2  --   --   PLT 203  --   --   HEPARINUNFRC  --   --  0.13*  CREATININE 0.51  --   --   TROPONINI <0.30 <0.30  --     Estimated Creatinine Clearance: 50.1 ml/min (by C-G formula based on Cr of 0.51).  Assessment: [name] is a 62 yo woman on heparin per pharmacy for NSTEMI.  Wt on 12/05/12 was 44.2 kg.  She reports her wt to be 87 lbs (39.5 kg).   Initial heparin level low at 0.13. No bleeding issues noted. Plan for cath tomorrow.  Goal of Therapy:  Heparin level 0.3-0.7 units/ml Monitor platelets by anticoagulation protocol: Yes   Plan:  Increase heparin to 650 units/hr Heparin level in 8h then daily Plan for cath 8/22 Consider switching antiplatelet agent to Brilinta given very low body wt and high risk for bleeding or reduce dose of Effient to 5mg .  Sheppard Coil PharmD., BCPS Clinical Pharmacist Pager [phone] 01/11/2013 4:21 PM

## 2013-01-11 NOTE — ED Provider Notes (Signed)
CSN: [id number]     Arrival date & time 01/11/13  0509 History     First MD Initiated Contact with Patient 01/11/13 0516     Chief Complaint  Patient presents with  . Chest Pain   (Consider location/radiation/quality/duration/timing/severity/associated sxs/prior Treatment) HPI 62 yo female presents to the ER from home via EMS with complaint of chest pain.  Pt reports pain is substernal, radiates into her jaw bilaterally and down her left arm.  Pain is similar to prior heart attacks.  Pain started yesterday at around 1pm.  She has been taking ntg with only minimal improvement.  This morning she called 911 when she took 2 ntg and had no improvement in pain.  EMS gave 324 of aspirin and additional ntg.  Pt had drop in bp that responded to iv bolus.  Pt with complicated pmh of CAD/MI, COPD, Lung cancer.  Pt recently had esophageal thrush, but reports this has resolved, and was never associated with pain.  No cough.  She has had sob, diaphoresis, and nausea with the pain at times.  Pt s/p CABG.  She has history of cardiac arrest from MI in 2006.    Past Medical History  Diagnosis Date  . Acute myocardial infarction, unspecified site, episode of care unspecified   . Esophageal dysmotility   . Unspecified hemorrhoids without mention of complication   . Hemorrhoids   . Esophageal stricture   . Diaphragmatic hernia without mention of obstruction or gangrene   . 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 Coronary atherosclerosis of unspecified type of vessel, native or graft   . Peripheral neuropathy   . Other and unspecified hyperlipidemia   . Peptic ulcer, unspecified site, unspecified as acute or chronic, without mention of hemorrhage, perforation, or obstruction   . Unspecified cardiovascular disease   . Depressive disorder, not elsewhere classified   . Anxiety state, unspecified   . COPD (chronic obstructive pulmonary disease)   . GERD (gastroesophageal reflux disease)   .  Candida esophagitis     on multiple occasions  . C. difficile diarrhea   . PVD (peripheral vascular disease)   . Headache(784.0)   . Weight loss 06/13/2011    scheduled here by family doctor  . CHF (congestive heart failure) 2011  . Asthma   . Hypertension   . Hx of radiation therapy 02/28/12 -04/14/12    right lung   Past Surgical History  Procedure Laterality Date  . Tonsillectomy  1974  . Fracture lt jaw  1976  . Breast surgery  1969, 1970    bilateral  . Epigastric hernia repair  2008    Dr Dorris Fetch  . Coronary artery bypass graft  1995  . Carpal tunnel release    . Total abdominal hysterectomy  1998  . Laminectomy    . Coronary angioplasty      multiple  . Subclavian vein angioplasty / stenting     Family History  Problem Relation Age of Onset  . Lung cancer Father   . Heart disease Father   . Asthma Father   . Allergies Father   . Cancer Father   . Heart disease Mother   . Cancer Mother   . Heart disease Sister   . Colon cancer Neg Hx   . Cancer Brother    History  Substance Use Topics  . Smoking status: Former Smoker -- 1.50 packs/day for 30 years    Types: Cigarettes    Quit date: 05/24/2009  .  Smokeless tobacco: Never Used  . Alcohol Use: No   OB History   Grav Para Term Preterm Abortions TAB SAB Ect Mult Living                 Review of Systems  All other systems reviewed and are negative.    Allergies  Amiodarone; Clopidogrel bisulfate; and Dilaudid  Home Medications   Current Outpatient Rx  Name  Route  Sig  Dispense  Refill  . albuterol (PROVENTIL HFA;VENTOLIN HFA) 108 (90 BASE) MCG/ACT inhaler   Inhalation   Inhale 2 puffs into the lungs every 6 (six) hours as needed. For wheezing         . aspirin 81 MG EC tablet   Oral   Take 81 mg by mouth daily.           Marland Kitchen atorvastatin (LIPITOR) 80 MG tablet   Oral   Take 80 mg by mouth at bedtime.         . Cholecalciferol (VITAMIN D-3) 5000 UNITS TABS   Oral   Take 1 tablet  by mouth daily.         Marland Kitchen ezetimibe (ZETIA) 10 MG tablet   Oral   Take 10 mg by mouth daily.           . famotidine (PEPCID) 20 MG tablet   Oral   Take 20 mg by mouth at bedtime.         Marland Kitchen HYDROcodone-acetaminophen (NORCO) 10-325 MG per tablet   Oral   Take 2 tablets by mouth every 4 (four) hours as needed for pain.         . nitroGLYCERIN (NITROSTAT) 0.4 MG SL tablet   Sublingual   Place 0.4 mg under the tongue every 5 (five) minutes as needed. For chest pain         . omeprazole (PRILOSEC) 20 MG capsule   Oral   Take 20 mg by mouth 2 (two) times daily.           . prasugrel (EFFIENT) 10 MG TABS   Oral   Take 10 mg by mouth daily.          Marland Kitchen SPIRIVA HANDIHALER 18 MCG inhalation capsule      PLACE 1 CAPSULE INTO INHALER AND INHALE CONTENTS ONCE DAILY   30 capsule   5   . SYMBICORT 160-4.5 MCG/ACT inhaler      INHALE 2 PUFFS INTO THE LUNGS 2 TIMES DAILY.   1 Inhaler   5    BP 105/56  Pulse 57  Temp(Src) 98.4 F (36.9 C) (Oral)  Resp 16  SpO2 100% Physical Exam  Nursing note and vitals reviewed. Constitutional: She is oriented to person, place, and time. No distress.  Underweight, chronically ill appearing  HENT:  Head: Normocephalic and atraumatic.  Nose: Nose normal.  Mouth/Throat: Oropharynx is clear and moist.  Eyes: Conjunctivae and EOM are normal. Pupils are equal, round, and reactive to light.  Neck: Normal range of motion. Neck supple. No JVD present. No tracheal deviation present. No thyromegaly present.  Cardiovascular: Normal rate, regular rhythm, normal heart sounds and intact distal pulses.  Exam reveals no gallop and no friction rub.   No murmur heard. Pulmonary/Chest: Effort normal and breath sounds normal. No stridor. No respiratory distress. She has no wheezes. She has no rales. She exhibits no tenderness.  Abdominal: Soft. Bowel sounds are normal. She exhibits no distension and no mass. There is no tenderness. There is no  rebound  and no guarding.  Musculoskeletal: Normal range of motion. She exhibits no edema and no tenderness.  Lymphadenopathy:    She has no cervical adenopathy.  Neurological: She is alert and oriented to person, place, and time. She exhibits normal muscle tone. Coordination normal.  Skin: Skin is warm and dry. No rash noted. No erythema. No pallor.  Psychiatric: She has a normal mood and affect. Her behavior is normal. Judgment and thought content normal.    ED Course   Procedures (including critical care time)  Labs Reviewed  BASIC METABOLIC PANEL - Abnormal; Notable for the following:    Glucose, Bld 101 (*)    All other components within normal limits  POCT I-STAT TROPONIN I - Abnormal; Notable for the following:    Troponin i, poc 0.19 (*)    All other components within normal limits  CBC  TROPONIN I  HEPARIN LEVEL (UNFRACTIONATED)    Date: 01/11/2013  Rate: 75  Rhythm: normal sinus rhythm  QRS Axis: normal  Intervals: normal  ST/T Wave abnormalities: ST depressions diffusely  Conduction Disutrbances:none  Narrative Interpretation:   Old EKG Reviewed: changes noted   Dg Chest Port 1 View  01/11/2013   *RADIOLOGY REPORT*  Clinical Data: Chest pain, shortness of breath  PORTABLE CHEST - 1 VIEW  Comparison: Prior CT from 11/07/12.  Findings: Cardiac and mediastinal silhouettes are stable.  Median sternotomy wires of underlying CABG markers and surgical clips are again noted.  Extensive spinal fixation hardware involving the thoracolumbar spine is partially visualized.  The lungs are normally inflated.  No airspace consolidation, pleural effusion, or pulmonary edema is identified.  There is segmental atelectasis within the left lung base.  No acute osseous abnormality.  IMPRESSION: Segmental left lower lobe atelectasis.  Otherwise stable appearance of the chest with no acute cardiopulmonary process.   Original Report Authenticated By: Rise Mu, M.D.   1. NSTEMI (non-ST  elevated myocardial infarction)   2. Acute myocardial infarction, unspecified site, episode of care unspecified   3. Coronary atherosclerosis of unspecified type of vessel, native or graft   4. PVD (peripheral vascular disease)     MDM  62 yo female with over 24 hours of chest pain.  EKG with mild diffuse st depressions, new from prior.  POC troponin positive, will start heparin.  Will d/w her cardiology group for admission.     Olivia Mackie, MD 01/11/13 [phone]

## 2013-01-11 NOTE — ED Notes (Signed)
Error in charting, pt is not currently taking any blood thinners but pt is taking Effient, anticoagulant.

## 2013-01-11 NOTE — Care Management Note (Addendum)
    Page 1 of 1   01/12/2013     12:42:49 PM   CARE MANAGEMENT NOTE 01/12/2013  Patient:  KRIPA, FOSKEY   Account Number:  [id number]  Date Initiated:  01/11/2013  Documentation initiated by:  Junius Creamer  Subjective/Objective Assessment:   adm w mi     Action/Plan:   lives w husband, pcp dr Felipa Eth   Anticipated DC Date:     Anticipated DC Plan:        DC Planning Services  CM consult      Choice offered to / List presented to:             Status of service:  In process, will continue to follow Medicare Important Message given?   (If response is "NO", the following Medicare IM given date fields will be blank) Date Medicare IM given:   Date Additional Medicare IM given:    Discharge Disposition:    Per UR Regulation:  Reviewed for med. necessity/level of care/duration of stay  If discussed at Long Length of Stay Meetings, dates discussed:    Comments:  01/12/13 1241 Noted CM referral for Effient medication assistance.  Pt. has Medicaid, and his $3 copay/medications. Tera Mater, RN, BSN NCM [phone]

## 2013-01-11 NOTE — ED Notes (Signed)
I stat troponin results given to Dr. Otter by B. Chevy Virgo, EMT 

## 2013-01-11 NOTE — Progress Notes (Signed)
ANTICOAGULATION CONSULT NOTE - Initial Consult  Pharmacy Consult for heparin Indication: chest pain/ACS  Allergies  Allergen Reactions  . Amiodarone     Drops HR too low  . Clopidogrel Bisulfate     REACTION: hives  . Dilaudid [Hydromorphone Hcl] Other (See Comments)    "I couldn't function for 2 days."    Patient Measurements:    Weight: 44.6 kg on 12/04/12  Vital Signs: Temp: 98.4 F (36.9 C) (08/21 0514) Temp src: Oral (08/21 0514) BP: 105/58 mmHg (08/21 0514) Pulse Rate: 76 (08/21 0514)  Labs:  Recent Labs  01/11/13 0521  HGB 13.2  HCT 38.2  PLT 203    The CrCl is unknown because both a height and weight (above a minimum accepted value) are required for this calculation.   Medical History: Past Medical History  Diagnosis Date  . Acute myocardial infarction, unspecified site, episode of care unspecified   . Esophageal dysmotility   . Unspecified hemorrhoids without mention of complication   . Hemorrhoids   . Esophageal stricture   . Diaphragmatic hernia without mention of obstruction or gangrene   . 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 Coronary atherosclerosis of unspecified type of vessel, native or graft   . Peripheral neuropathy   . Other and unspecified hyperlipidemia   . Peptic ulcer, unspecified site, unspecified as acute or chronic, without mention of hemorrhage, perforation, or obstruction   . Unspecified cardiovascular disease   . Depressive disorder, not elsewhere classified   . Anxiety state, unspecified   . COPD (chronic obstructive pulmonary disease)   . GERD (gastroesophageal reflux disease)   . Candida esophagitis     on multiple occasions  . C. difficile diarrhea   . PVD (peripheral vascular disease)   . Headache(784.0)   . Weight loss 06/13/2011    scheduled here by family doctor  . CHF (congestive heart failure) 2011  . Asthma   . Hypertension   . Hx of radiation therapy 02/28/12 -04/14/12    right lung     Medications:  See med hx. On effient  Assessment: [name] is a 62 yo woman to start heparin per pharmacy for NSTEMI.  Wt on 12/05/12 was 44.2 kg.  She reports her wt to be 87 lbs (39.5 kg).  Her PMH is significant for lung cancer and AMI.  CBC wnl.  She is not on blood thinner but on effient - antiplatelet.  Her istat troponin 0.19.    Goal of Therapy:  Heparin level 0.3-0.7 units/ml Monitor platelets by anticoagulation protocol: Yes   Plan:  Give 2000 units bolus x 1 Start heparin infusion at 500 units/hr Check anti-Xa level in 8 hours and daily while on heparin Continue to monitor H&H and platelets Herby Abraham, Pharm.D. [phone] 01/11/2013 5:57 AM

## 2013-01-11 NOTE — ED Notes (Addendum)
PER EMS: pt from home, reports CP that started about 24 hrs ago. Took 4 nitro prior to EMS with no relief, EMS adm 324 aspirin PTA and 2 additional nitro with minimal relief. Clear lung sounds. EMS states BP decreased 86/60 and was given 1L bolus of NaCl. Hx of a-fib HR 70-90, lung cancer. Hx of 6 MI's and EMS reports that in 10/29/2004 pt coded during EMS ride to the hospital. Pt on blood thinner.

## 2013-01-12 ENCOUNTER — Encounter (HOSPITAL_COMMUNITY)
Admission: EM | Disposition: A | Discharge status: Home / Self Care | Payer: Self-pay | Source: Home / Self Care | Attending: Cardiovascular Disease

## 2013-01-12 DIAGNOSIS — I25810 Atherosclerosis of coronary artery bypass graft(s) without angina pectoris: Principal | ICD-10-CM

## 2013-01-12 HISTORY — PX: LEFT HEART CATHETERIZATION WITH CORONARY/GRAFT ANGIOGRAM: SHX5450

## 2013-01-12 HISTORY — PX: CORONARY ANGIOPLASTY WITH STENT PLACEMENT: SHX49

## 2013-01-12 LAB — BASIC METABOLIC PANEL
BUN: 10 mg/dL (ref 6–23)
CO2: 28 mEq/L (ref 19–32)
Calcium: 9.5 mg/dL (ref 8.4–10.5)
Chloride: 106 mEq/L (ref 96–112)
Creatinine, Ser: 0.54 mg/dL (ref 0.50–1.10)
GFR calc Af Amer: >90 mL/min (ref >90)
GFR calc non Af Amer: >90 mL/min (ref >90)
Glucose, Bld: 94 mg/dL (ref 70–99)
Potassium: 3.8 mEq/L (ref 3.5–5.1)
Sodium: 141 mEq/L (ref 135–145)

## 2013-01-12 LAB — CBC
HCT: 40.2 % (ref 36.0–46.0)
Hemoglobin: 14 g/dL (ref 12.0–15.0)
MCHC: 34.8 g/dL (ref 30.0–36.0)
WBC: 5.9 10*3/uL (ref 4.0–10.5)

## 2013-01-12 LAB — HEPARIN LEVEL (UNFRACTIONATED)
Heparin Unfractionated: <0.1 IU/mL — ABNORMAL LOW (ref 0.30–0.70)
Heparin Unfractionated: 0.32 IU/mL (ref 0.30–0.70)

## 2013-01-12 LAB — PROTIME-INR
INR: 1.12 (ref 0.00–1.49)
Prothrombin Time: 14.2 seconds (ref 11.6–15.2)

## 2013-01-12 LAB — TROPONIN I: Troponin I: <0.3 ng/mL (ref <0.30)

## 2013-01-12 SURGERY — LEFT HEART CATHETERIZATION WITH CORONARY/GRAFT ANGIOGRAM
Anesthesia: LOCAL

## 2013-01-12 MED ORDER — LIDOCAINE HCL (PF) 1 % IJ SOLN
INTRAMUSCULAR | Status: AC
  Filled 2013-01-12: qty 30

## 2013-01-12 MED ORDER — SODIUM CHLORIDE 0.9 % IJ SOLN: 3.0000 mL | Freq: Two times a day (BID) | INTRAMUSCULAR | Status: DC

## 2013-01-12 MED ORDER — HEPARIN BOLUS VIA INFUSION
1000.0000 [IU] | Freq: Once | INTRAVENOUS | Status: AC
  Administered 2013-01-12: 1000 [IU] via INTRAVENOUS
  Filled 2013-01-12: qty 1000

## 2013-01-12 MED ORDER — HEPARIN (PORCINE) IN NACL 2-0.9 UNIT/ML-% IJ SOLN
INTRAMUSCULAR | Status: AC
  Filled 2013-01-12: qty 1000

## 2013-01-12 MED ORDER — MIDAZOLAM HCL 2 MG/2ML IJ SOLN
INTRAMUSCULAR | Status: AC
  Filled 2013-01-12: qty 2

## 2013-01-12 MED ORDER — HYDRALAZINE HCL 20 MG/ML IJ SOLN: 10.0000 mg | Freq: Once | INTRAMUSCULAR | Status: DC

## 2013-01-12 MED ORDER — MORPHINE SULFATE 2 MG/ML IJ SOLN
INTRAMUSCULAR | Status: AC
  Filled 2013-01-12: qty 1

## 2013-01-12 MED ORDER — SODIUM CHLORIDE 0.9 % IV SOLN: 250.0000 mL | INTRAVENOUS | Status: DC | PRN

## 2013-01-12 MED ORDER — SODIUM CHLORIDE 0.9 % IJ SOLN: 3.0000 mL | INTRAMUSCULAR | Status: DC | PRN

## 2013-01-12 MED ORDER — FENTANYL CITRATE 0.05 MG/ML IJ SOLN
INTRAMUSCULAR | Status: AC
  Filled 2013-01-12: qty 2

## 2013-01-12 MED ORDER — ASPIRIN 81 MG PO CHEW
324.0000 mg | CHEWABLE_TABLET | Freq: Once | ORAL | Status: AC
  Administered 2013-01-12: 324 mg via ORAL
  Filled 2013-01-12: qty 4

## 2013-01-12 MED ORDER — NITROGLYCERIN IN D5W 200-5 MCG/ML-% IV SOLN: 2.0000 ug/min | INTRAVENOUS | Status: DC

## 2013-01-12 MED ORDER — BIVALIRUDIN 250 MG IV SOLR
0.2500 mg/kg/h | INTRAVENOUS | Status: AC
  Filled 2013-01-12: qty 250

## 2013-01-12 MED ORDER — SODIUM CHLORIDE 0.9 % IV SOLN: 1.0000 mL/kg/h | INTRAVENOUS | Status: AC

## 2013-01-12 MED ORDER — NITROGLYCERIN 0.2 MG/ML ON CALL CATH LAB
INTRAVENOUS | Status: AC
  Filled 2013-01-12: qty 1

## 2013-01-12 MED ORDER — ACETAMINOPHEN 325 MG PO TABS: 650.0000 mg | ORAL_TABLET | ORAL | Status: DC | PRN

## 2013-01-12 MED ORDER — HYDRALAZINE HCL 20 MG/ML IJ SOLN: 10.0000 mg | INTRAMUSCULAR | Status: DC | PRN

## 2013-01-12 MED ORDER — ONDANSETRON HCL 4 MG/2ML IJ SOLN: 4.0000 mg | Freq: Four times a day (QID) | INTRAMUSCULAR | Status: DC | PRN

## 2013-01-12 MED ORDER — BIVALIRUDIN 250 MG IV SOLR
INTRAVENOUS | Status: AC
  Filled 2013-01-12: qty 250

## 2013-01-12 MED ORDER — PRASUGREL HCL 10 MG PO TABS
ORAL_TABLET | ORAL | Status: AC
  Filled 2013-01-12: qty 1

## 2013-01-12 MED ORDER — SODIUM CHLORIDE 0.9 % IV BOLUS (SEPSIS)
250.0000 mL | Freq: Once | INTRAVENOUS | Status: AC
  Administered 2013-01-13: 250 mL via INTRAVENOUS

## 2013-01-12 NOTE — Progress Notes (Signed)
Site area: right groin  Site Prior to Removal:  Level 0  Pressure Applied For 20 MINUTES    Minutes Beginning at 1410  Manual:   yes  Patient Status During Pull:  Stable   Post Pull Groin Site:  Level 0  Post Pull Instructions Given:  yes  Post Pull Pulses Present:  yes  Dressing Applied:  yes  Comments:

## 2013-01-12 NOTE — Interval H&P Note (Signed)
History and Physical Interval Note:  01/12/2013 7:44 AM  [name]  has presented today for surgery, with the diagnosis of ACS/NSTEMI with known CAD-CABG  The various methods of treatment have been discussed with the patient and family.  She was seen by Dr. [name] yesterday upon presentation & recommended cardiac catheterization / native&graft angio +/- PCI. She has had mild intermitted CP o/n - but nothing "notable".   After consideration of risks, benefits and other options for treatment, the patient has consented to  Procedure(s): LEFT HEART CATHETERIZATION WITH CORONARY/GRAFT ANGIOGRAM (N/A) +/- PCI as a surgical intervention .  The patient's history has been reviewed, patient examined, no change in status, stable for surgery.  I have reviewed the patient's chart and labs.  Questions were answered to the patient's satisfaction.    The procedure with Risks/Benefits/Alternatives and Indications was reviewed with the patient.  All questions were answered.    Risks / Complications include, but not limited to: Death, MI, CVA/TIA, VF/VT (with defibrillation), Bradycardia (need for temporary pacer placement), contrast induced nephropathy, bleeding / bruising / hematoma / pseudoaneurysm, vascular or coronary injury (with possible emergent CT or Vascular Surgery), adverse medication reactions, infection.    The patient voices understanding and agree to proceed.    HARDING,DAVID W  Cath Lab Visit (complete for each Cath Lab visit)  Clinical Evaluation Leading to the Procedure:   ACS: yes  Non-ACS:    Anginal Classification: CCS IV  Anti-ischemic medical therapy: Minimal Therapy (1 class of medications) prior to presentation  Non-Invasive Test Results: No non-invasive testing performed  Prior CABG: Previous CABG

## 2013-01-12 NOTE — Progress Notes (Signed)
ANTICOAGULATION CONSULT NOTE - Initial Consult  Pharmacy Consult for angiomax Indication: ACS s/p cath  Allergies  Allergen Reactions  . Amiodarone     Drops HR too low  . Clopidogrel Bisulfate     REACTION: hives  . Dilaudid [Hydromorphone Hcl] Other (See Comments)    "I couldn't function for 2 days."    Patient Measurements: Height: 5\' 6"  (167.6 cm) Weight: 93 lb 11.1 oz (42.5 kg) IBW/kg (Calculated) : 59.3 Heparin Dosing Weight:   Vital Signs: Temp: 98.3 F (36.8 C) (08/22 1030) Temp src: Oral (08/22 1030) BP: 131/64 mmHg (08/22 1030) Pulse Rate: 52 (08/22 1030)  Labs:  Recent Labs  01/11/13 0521 01/11/13 1001 01/11/13 1508 01/11/13 1540 01/12/13 0015 01/12/13 0442 01/12/13 0737  HGB 13.2  --   --   --   --  14.0  --   HCT 38.2  --   --   --   --  40.2  --   PLT 203  --   --   --   --  203  --   LABPROT  --   --   --   --   --  14.2  --   INR  --   --   --   --   --  1.12  --   HEPARINUNFRC  --   --  0.13*  --  <0.10*  --  0.32  CREATININE 0.51  --   --   --   --  0.54  --   TROPONINI <0.30 <0.30  --  <0.30  --  <0.30  --     Estimated Creatinine Clearance: 50.2 ml/min (by C-G formula based on Cr of 0.54).   Medical History: Past Medical History  Diagnosis Date  . Acute myocardial infarction, unspecified site, episode of care unspecified   . Esophageal dysmotility   . Hemorrhoids   . Esophageal stricture   . Diaphragmatic hernia without mention of obstruction or gangrene   . 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 CAD (coronary artery disease)     CABG'96.  . Peripheral neuropathy   . Other and unspecified hyperlipidemia   . Peptic ulcer, unspecified site, unspecified as acute or chronic, without mention of hemorrhage, perforation, or obstruction   . Depressive disorder, not elsewhere classified   . Anxiety state, unspecified   . COPD (chronic obstructive pulmonary disease)   . GERD (gastroesophageal reflux disease)   . Candida  esophagitis     on multiple occasions  . C. difficile diarrhea   . PVD (peripheral vascular disease)   . Headache(784.0)   . Weight loss 06/13/2011    scheduled here by family doctor  . CHF (congestive heart failure) 2011  . Asthma   . Hypertension   . Hx of radiation therapy 02/28/12 -04/14/12    right lung    Medications:  Scheduled:  . aspirin EC  81 mg Oral Daily  . atorvastatin  80 mg Oral QHS  . budesonide-formoterol  2 puff Inhalation BID  . cholecalciferol  5,000 Units Oral Daily  . ezetimibe  10 mg Oral Daily  . feeding supplement  237 mL Oral TID BM  . pantoprazole  40 mg Oral Daily  . prasugrel  10 mg Oral Daily  . sodium chloride  3 mL Intravenous Q12H  . tiotropium  18 mcg Inhalation Daily   Infusions:  . sodium chloride 1 mL/kg/hr (01/12/13 0959)  . bivalirudin (ANGIOMAX) infusion 5 mg/mL (Cath  Lab,ACS,PCI indication)    . nitroGLYCERIN      Assessment: 62 yo female with ACS s/p cath will be continued on angiomax x 2 hrs.  CrCl 50. Goal of Therapy:  Monitor platelets by anticoagulation protocol: Yes   Plan:  1) Continue angiomax at 0.25 mg/kg/hr x2 hrs and off.  Doyal Saric, Tsz-Yin 01/12/2013,11:11 AM

## 2013-01-12 NOTE — Progress Notes (Signed)
ANTICOAGULATION CONSULT NOTE - Follow Up Consult  Pharmacy Consult for heparin Indication: NSTEMI  Labs:  Recent Labs  01/11/13 0521 01/11/13 1001 01/11/13 1508 01/11/13 1540 01/12/13 0015  HGB 13.2  --   --   --   --   HCT 38.2  --   --   --   --   PLT 203  --   --   --   --   HEPARINUNFRC  --   --  0.13*  --  <0.10*  CREATININE 0.51  --   --   --   --   TROPONINI <0.30 <0.30  --  <0.30  --     Assessment: 62yo female now undetectable on heparin despite rate increase.  Goal of Therapy:  Heparin level 0.3-0.7 units/ml   Plan:  Will rebolus with heparin 1000 units x1 and increase gtt by 4 units/kg/hr to 850 units/hr and check level in 6hr.  Vernard Gambles, PharmD, BCPS  01/12/2013,1:39 AM

## 2013-01-12 NOTE — CV Procedure (Signed)
CARDIAC CATHETERIZATION AND PERCUTANEOUS CORONARY INTERVENTION REPORT  NAME:  [name]   MRN: [id number] DOB:  1950/12/05   ADMIT DATE: 01/11/2013 Procedure Date: 01/12/2013  INTERVENTIONAL CARDIOLOGIST: Marykay Lex, M.D., MS PRIMARY CARE PROVIDER: [name], MD PRIMARY CARDIOLOGIST: Runell Gess., M.D.  PATIENT:  [name] is a 62 y.o. female known coronary disease status post CABG in 1995 with LIMA-LAD (that may be sequential to diagonal), SVG-D1-OM1 at OM 2, SVG-distal RCA (potential sequential PDA and PLA), as well as right longIIIa non-small cell lung cancer diagnosed in 2011.  She presented yesterday (01/11/2013) with classic anginal discomfort described as burning in her throat radiating to her jaw.  It was relieved with nitroglycerin.  She was seen by Dr. [name] about this was consistent with unstable angina.  She is referred for cardiac catheterization possible PCI.  She is ruled out for MI, but has had intermittent discomfort overnight.  She has been on aspirin and Effient long-term.  PRE-OPERATIVE DIAGNOSIS:    Unstable Angina  PROCEDURES PERFORMED:    LEFT HEART CATHETERIZATION WITH NATIVE CORONARY AND GRAFT ANGIOGRAPHY  PERCUTANEOUS CORONARY INTERVENTION ON THE DISTAL SVG-RPDA WITH XIENCE Xpedition DES 3.0 mm x 18 mm (post-dilated to 3.25 mm)  PROCEDURE:Consent:  Risks of procedure as well as the alternatives and risks of each were explained to the (patient/caregiver).  Consent for procedure obtained. Consent for signed by MD and patient with RN witness -- placed on chart.   PROCEDURE: The patient was brought to the 2nd Floor [facility] in the fasting state and prepped and draped in the usual sterile fashion for Right groin or radial access. A modified Allen's test with plethysmography was performed, revealing excellent Ulnar artery collateral flow.  Sterile technique was used including antiseptics, cap, gloves, gown, hand  hygiene, mask and sheet.  Skin prep: Chlorhexidine.  Time Out: Verified patient identification, verified procedure, site/side was marked, verified correct patient position, special equipment/implants available, medications/allergies/relevent history reviewed, required imaging and test results available.  Performed  Access: Right Common Femoral Artery; 5 Fr Sheath -- fluoroscopically guided modified Seldinger technique (Angiocath Micropuncture Kit)  Diagnostic: JL4, JR 4, angled pigtail catheters advanced and exchanged over standard J-wire   Left Coronary Artery Angiography: JL4  Right Coronary Artery, SVG-RCA, SVG-D1-OM1-OM 2, LIMA-LAD Angiography: JR 4  LV Hemodynamics (LV Gram): Angled pigtail  Sheath:  Sutured in place to be removed in with manual pressure.  No hematoma noted.  MEDICATIONS:  Anesthesia:  Local Lidocaine 10 ml  Sedation:  3 mg IV Versed, 100 mcg IV fentanyl ;   Omnipaque Contrast: 155 ml  Anticoagulation:  Weight-based Angiomax Bolus & drip - reduced rate for 2 hours post PCI  Anti-Platelet Agent:  Aspirin 325 mg, Effient 10 mg  Hemodynamics:  Central Aortic / Mean Pressures: 110/49 mmHg; 70 mmHg  Left Ventricular Pressures / EDP: 117/1 mmHg; 3 mmHg -- 250 mL normal saline bolus  Left Ventriculography:  EF: 50-55 %  Wall Motion: Normal  Coronary Anatomy:  Left Main: Moderate caliber vessel with occluded Circumflex, that perfuses the proximal portion of the LAD in a small diagonal branch.   LAD: Is a 99% proximal stenosis followed by total occlusion in the mid vessel with distal competitive flow.  LIMA-LAD: Widely patent, moderate caliber vessel that reaches the mid LAD, downstream LAD is free of significant disease.  The graft also perfuses a diagonal, there was question of this being a sequential graft.,  Diagnosis small-caliber vessel but  free of significant disease. Left Circumflex: 100% ostial occlusion  SVG-D1-OM1-OM 2: Widely patent graft to a  very small D1, OM1 and OM 2.  These vessels are very small and diameter, D1 has a proximal/ostial 60 % stenosis but there was TIMI-3 flow throughout all grafted vessels.   RCA: Large-caliber vessel with 90% proximal stenosis and 100% occluded in the genu.   SVG-distal RCA (sequential PDA-PL): Large-caliber graft, 2 very large distribution PDA and PL system.  There is a distal 95%, irregular stenosis in his graft.  Is relatively close to the anastomosis point  Review of diagnostic angina revealed the obvious culprit lesion being in the SVG-RPDA.  Plans are made to proceed with intervention.  Percutaneous Coronary Intervention:  Sheath exchanged for 6 Fr Guide: 6 Fr   RCB Guidewire: Pro-water There was no room beyond the lesion to land distal protection device.  Also due to the eccentric and irregular in nature of this very severe stenosis, did not feel that we could safely pass a distal protection device or plan it would not room to intervene on this lesion.  Therefore the decision was made to simply do a small inflation with a predilation balloon followed by direct stenting. Predilation Balloon: Mini Trek 2.0 mm x 12 mm;   8 Atm x 11 Sec, 8 Atm x 20 Sec  Intracoronary nitroglycerin 200 mcg Stent: Xience Xpedition 3.0 mm x 18 mm;   14 Atm x 30 Sec, -   following initial inflation, scout angiography revealed excellent stent positioning with TIMI 3 flow distally.  However due to shifting of the guide catheter the balloon removed from the stent location, therefore decision made to remove the stent balloon and used a post-dilation balloon.  Intracoronary Nitroglycerin Injection Post-dilation Balloon: [place] Trek 3.25 mm x 15 mm;   12 Atm x 30 Sec  Final Diameter - 3.25 mm  Post deployment angiography in multiple views, with and without guidewire in place revealed excellent stent deployment and lesion coverage.  There was no evidence of dissection or perforation.  In fact there is a small  posterior lateral branch, not fully visualized on pre-PCI angiography that was seen filling late with TIMI 2 flow. The patient did note significant chest discomfort with both stent balloon and post relation balloon ablation.  2 injections of intracoronary nitroglycerin glycerin, [place] administered.  She was also given additional sedation.  PATIENT DISPOSITION:    The patient was transferred to the PACU holding area in a hemodynamicaly stable, but was noting left-sided neck and arm discomfort, despite a beautiful PCI results no evidence of no reflow.  The patient tolerated the procedure well, and there were no complications.  EBL:   < 20 ml  The patient was stable before, during, and after the procedure.  POST-OPERATIVE DIAGNOSIS:    Severe native coronary disease as previous documented.  Culprit lesion is a 95% stenosis in the distal SVG-RCA.  Status post successful PCI with a Xience Xpedition DES stent  Patent LIMA-LAD and SVG-D1-OM1-OM 2.  PLAN OF CARE:  Standard post PCI care, we'll run Angiomax for 2 hours due to potential downstream embolization from the vein graft intervention.  Will continue IV nitroglycerin infusion overnight.  Continue dual antiplatelet therapy  Discharge in the morning.  Patient's primary cardiologist was notified, as visualized report and angiography.   Marykay Lex, M.D., M.S. [facility] [address] [place], [place]  [address]  [phone]  01/12/2013 9:36 AM

## 2013-01-13 DIAGNOSIS — Z955 Presence of coronary angioplasty implant and graft: Secondary | ICD-10-CM

## 2013-01-13 DIAGNOSIS — I255 Ischemic cardiomyopathy: Secondary | ICD-10-CM | POA: Diagnosis present

## 2013-01-13 DIAGNOSIS — I25810 Atherosclerosis of coronary artery bypass graft(s) without angina pectoris: Secondary | ICD-10-CM | POA: Diagnosis present

## 2013-01-13 LAB — CBC
HCT: 35.5 % — ABNORMAL LOW (ref 36.0–46.0)
MCH: 33.5 pg (ref 26.0–34.0)
MCV: 96.7 fL (ref 78.0–100.0)
Platelets: 187 10*3/uL (ref 150–400)
RDW: 12.6 % (ref 11.5–15.5)
WBC: 5 10*3/uL (ref 4.0–10.5)

## 2013-01-13 LAB — BASIC METABOLIC PANEL
BUN: 8 mg/dL (ref 6–23)
CO2: 27 mEq/L (ref 19–32)
Calcium: 9.8 mg/dL (ref 8.4–10.5)
Chloride: 103 mEq/L (ref 96–112)
Creatinine, Ser: 0.81 mg/dL (ref 0.50–1.10)
GFR calc Af Amer: 90 mL/min — ABNORMAL LOW (ref >90)

## 2013-01-13 MED ORDER — PRASUGREL HCL 10 MG PO TABS: 10.0000 mg | ORAL_TABLET | Freq: Every day | ORAL | Status: DC

## 2013-01-13 NOTE — Discharge Summary (Signed)
Physician Discharge Summary  Patient ID: [name] MRN: [id number] DOB/AGE: 01/31/1951 62 y.o.  Admit date: 01/11/2013 Discharge date: 01/13/2013  Admission Diagnoses: Unstable Angina  Discharge Diagnoses:  Principal Problem:   Unstable angina Active Problems:   HYPERLIPIDEMIA   DEPRESSION   CAD- CABG '96. 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 EF ~45%   CAD (coronary artery disease), autologous vein bypass graft   Presence of drug coated stent in SVG-RCA   Discharged Condition: stable  Hospital Course: The patient is an unfortunate 62 year old, thin and frail-appearing, [name] followed by Dr. [name]. She has a history of CAD, status post coronary artery bypass grafting in 1995, as well as left subclavian artery PCI and stenting for subclavian steal. Her other problems include discontinued tobacco abuse since January 2011, hypertension, dyslipidemia and GERD. Her last cath was performed Oct 03, 2009, revealing patent grafts. She has recently been diagnosed with non-small cell stage IIIA lung cancer diagnosed by biopsy performed by Dr. Dorris Fetch and has had chemo and radiation therapy. Dr. Shirline Frees follows her as an outpatient. Her last Myoview performed 07/14/2010 was nonischemic. She last saw Dr [name] in June.   She presented to the ER on 01/11/13 with throat pain, described as a "burning" in her throat that radiated to her jaw. She had a similar episode the day prior that was relieved with NTG. However, the day she arrived to the ER, her pain was only partially relived with SL NTG x 2. By the time she  had arrived to the ER, her pain had resolved and she wanted to go home. Work-up revealed positive troponin of 0.19. An EKG demonstrated subtle inferior ST depressions. She was admitted and placed on IV heparin and IV NTG. She underwent a LHC via the right femoral artery. This was done by Dr. Herbie Baltimore. She was found to have a 95% stenosis in the distal SVG-RCA, which was felt to be the culprit lesion. This was successfully treated with a DES. Her other grafts were patent and LV function was normal with an EF of 50-55%. She left the cath lab in stable condition. She was resumed on DAPT with ASA + Effient. She was kept overnight for observation and hydration. She had no post-cath complications. The cath site remained stable. She denied further chest pain. She had no pain with ambulation. Her BP however was a bit borderline, with SBP in the low 100s. Thus, it was decided to refrain from the use of BB and ACE-I for now and to reassess as an outpatient. She was last seen and examined by Dr. Herbie Baltimore, who determined that she was stable for discharge home. She will follow up with Dr. [name] at [facility].    Consults: None  Significant Diagnostic Studies: angiography: cardiac Hemodynamics:  Central Aortic / Mean Pressures: 110/49 mmHg; 70 mmHg  Left Ventricular Pressures / EDP: 117/1 mmHg; 3 mmHg -- 250 mL normal saline bolus Left Ventriculography:  EF: 50-55 %  Wall Motion: Normal  POST-OPERATIVE DIAGNOSIS:  Severe native coronary disease as previous documented.  Culprit lesion is a  95% stenosis in the distal SVG-RCA.  Status post successful PCI with a Xience Xpedition DES stent  Patent LIMA-LAD and SVG-D1-OM1-OM 2.   Treatments: See Hospital Course  Discharge Exam: Blood pressure 100/32, pulse 58, temperature 97.6 F (36.4 C), temperature source Oral, resp. rate 18, height 5\' 6"  (1.676 m), weight 94 lb 9.2 oz (42.9 kg), SpO2 97.00%.   Disposition: 01-Home or Self Care  Discharge Orders   Future  Appointments Provider Department Dept Phone   02/06/2013 12:30 PM Delcie Roch [facility] MEDICAL ONCOLOGY [phone]   02/06/2013 1:00 PM [facility] IM[facility] [phone]   Patient to arrive 15 minutes prior to appointment time.   02/08/2013 9:45 AM Si Gaul, MD [facility] MEDICAL ONCOLOGY [phone]   Future Orders Complete By Expires   Diet - low sodium heart healthy  As directed    Driving Restrictions  As directed    Comments:     No driving for 3 days   Increase activity slowly  As directed    Lifting restrictions  As directed    Comments:     No lifting more than 1/2 gallon of milk for 3 days       Medication List         albuterol 108 (90 BASE) MCG/ACT inhaler  Commonly known as:  PROVENTIL HFA;VENTOLIN HFA  Inhale 2 puffs into the lungs every 6 (six) hours as needed. For wheezing     aspirin 81 MG EC tablet  Take 81 mg by mouth daily.     atorvastatin 80 MG tablet  Commonly known as:  LIPITOR  Take 80 mg by mouth at bedtime.     EFFIENT 10 MG Tabs tablet  Generic drug:  prasugrel  Take 10 mg by mouth daily.     prasugrel 10 MG Tabs tablet  Commonly known as:  EFFIENT  Take 1 tablet (10 mg total) by mouth daily.     ezetimibe 10 MG tablet  Commonly known as:  ZETIA  Take 10 mg by mouth daily.     famotidine 20 MG tablet  Commonly known as:  PEPCID  Take 20 mg by mouth at bedtime.     HYDROcodone-acetaminophen 10-325 MG per tablet  Commonly known as:  NORCO  Take 2 tablets by mouth every 4 (four) hours as needed for pain.     nitroGLYCERIN 0.4 MG SL tablet  Commonly known as:  NITROSTAT  Place 0.4 mg under the tongue every 5 (five) minutes as needed. For chest pain     omeprazole 20 MG capsule  Commonly known as:  PRILOSEC  Take 20 mg by mouth 2 (two) times daily.     SPIRIVA HANDIHALER 18 MCG inhalation capsule  Generic drug:  tiotropium  PLACE 1 CAPSULE INTO INHALER AND INHALE  CONTENTS ONCE DAILY     SYMBICORT 160-4.5 MCG/ACT inhaler  Generic drug:  budesonide-formoterol  INHALE 2 PUFFS INTO THE LUNGS 2 TIMES DAILY.     Vitamin D-3 5000 UNITS Tabs  Take 1 tablet by mouth daily.           Follow-up Information   Follow up with Runell Gess, MD. (our office will call you with an appointment)    Specialty:  Cardiology   Contact information:   [address] [place] [address] [phone]      TIME SPENT ON DISCHARGE, INCLUDING PHYSICIAN TIME: >30 MINUTES  Signed: Allayne Butcher, PA-C  01/13/2013, 7:57 AM  I have seen and evaluated the patient this AM along with Boyce Medici, PA. I agree with her d/c summary.  BP & HR seem to have stabilized post PCI. She has been ambulating in the halls without difficulty.  No adverse effect.  Bp has beeb borderline throught her stay -- unable to tolerate addition of BP meds in the past -- will avoid BB or ACE-I for now - defer to Dr. [name] in OP setting.  On ASA & Prasugrel on arrival - continue for at least 1 yr.  PPI for GI prophylaxis.  MD Time with pt: 10 min  Brodie Scovell W, M.D., M.S. [facility] [address] [place], [place]  [address]  [phone] Pager # [phone] 01/13/2013 12:58 PM

## 2013-01-13 NOTE — Progress Notes (Signed)
CARDIAC REHAB PHASE I   PRE:  Rate/Rhythm: Sinus Rhythm 68  BP:   Sitting: 111/51     SaO2: 96% Room Air  MODE:  Ambulation: 600 ft   POST:  Rate/Rhythem: Sinus 70 occasional PVC  BP:    Sitting: 104/54     SaO2: 98% Room air  [phone] Patient ambulated in the hallway without complaint or chest discomfort.  Diet information exercise instructions reviwed with patient.  [name] is not interested in outpatient cardiac rehab at this time.   [name], Arta Bruce RN BSN

## 2013-01-13 NOTE — Progress Notes (Addendum)
Subjective:  Stable day yesterday once she settled down post PCI.  Had prolonged post stent angina, likely related to some distal grummus embolization from the SVG lesion,  Resolved after short course NTG gtt. No further CP & ambulating in hall.  Objective:  Vital Signs in the last 24 hours: Temp:  [97.8 F (36.6 C)-98.4 F (36.9 C)] 98 F (36.7 C) (08/23 0504) Pulse Rate:  [51-78] 51 (08/23 0504) Resp:  [15-18] 18 (08/23 0504) BP: (83-158)/(22-75) 122/22 mmHg (08/23 0504) SpO2:  [94 %-99 %] 97 % (08/23 0504) Weight:  [94 lb 9.2 oz (42.9 kg)] 94 lb 9.2 oz (42.9 kg) (08/23 0504)  Intake/Output from previous day: 08/22 0701 - 08/23 0700 In: 1792.1 [P.O.:1120; I.V.:422.1; IV Piggyback:250] Out: 1450 [Urine:1450] Intake/Output from this shift:    Physical Exam: General appearance: alert, cooperative, appears stated age, no distress and thin, frail.   Neck: no adenopathy, no carotid bruit, no JVD and supple, symmetrical, trachea midline Lungs: diffuse coarse BS in Rside with prolonged expiration; no W/Rales/Rhonchi Heart: regular rate and rhythm, S1, S2 normal, no murmur, click, rub or gallop and normal apical impulse Abdomen: soft, non-tender; bowel sounds normal; no masses,  no organomegaly and scaphoid; + bruit Extremities: mildly diminisished in feet. CAth site c/d/i Pulses: 2+ and symmetric upper Ext, 1+ LE; stable bilateral femoral bruits. Groin stable. Neurologic: Grossly normal  Lab Results:  Recent Labs  01/12/13 0442 01/13/13 0500  WBC 5.9 5.0  HGB 14.0 12.3  PLT 203 187    Recent Labs  01/11/13 0521 01/12/13 0442  NA 137 141  K 3.5 3.8  CL 103 106  CO2 25 28  GLUCOSE 101* 94  BUN 10 10  CREATININE 0.51 0.54    Recent Labs  01/11/13 1540 01/12/13 0442  TROPONINI <0.30 <0.30    Cardiac Studies:  Cath report reviewed  Assessment/Plan:  Principal Problem:   Unstable angina Active Problems:   CAD- CABG '96. Last cath 2011, low risk Myoview 2/12   CAD (coronary artery disease), autologous vein bypass graft   Presence of drug coated stent in SVG-RCA   COPD with emphysema   Cardiomyopathy, ischemic - mild to moderate. 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  She has been ambulating in the halls without difficulty.  No adverse effect.  Bp has beeb borderline throught her stay -- unable to tolerate addition of BP meds in the past -- will avoid BB or ACE-I for now - defer to Dr. [name] in OP setting.  On ASA & Prasugrel on arrival - continue for at least 1 yr. PPI for GI prophylaxis.   LOS: 2 days    HARDING,DAVID W 01/13/2013, 7:08 AM

## 2013-01-13 NOTE — Progress Notes (Signed)
Pt sitting on side of bed, BP 83/43 left arm using small adult cuff, rechecked rt arm 83/36.  Pt A&Ox3, just ambulated from bathroom without dizziness, denies complaints except chronic back pain.  EF 50% per cath.  Dr Leeann Must informed, order received.  Post cath fluids finished 4 hrs ago.  IV NS bolus to be given over 2 hours.  BSC placed near bed, call light in reach.  Reminded pt not to get up w/o assist due to risk for fall when BP low.  Pt voiced understanding and agreed to plan.

## 2013-01-13 NOTE — Progress Notes (Signed)
BP recheck 96/35 after 250 cc NS bolus given IV over 2 hrs.  Pt dozing quietly, awakens easily.  Rt groin level 0.  SB 50.

## 2013-01-23 ENCOUNTER — Other Ambulatory Visit: Payer: Self-pay | Admitting: Pulmonary Disease

## 2013-01-24 ENCOUNTER — Ambulatory Visit (INDEPENDENT_AMBULATORY_CARE_PROVIDER_SITE_OTHER): Payer: Medicaid Other | Admitting: Cardiology

## 2013-01-24 ENCOUNTER — Encounter: Payer: Self-pay | Admitting: Cardiology

## 2013-01-24 VITALS — BP 100/66 | HR 58 | Ht 67.0 in | Wt 93.4 lb

## 2013-01-24 DIAGNOSIS — I959 Hypotension, unspecified: Secondary | ICD-10-CM

## 2013-01-24 DIAGNOSIS — I2510 Atherosclerotic heart disease of native coronary artery without angina pectoris: Secondary | ICD-10-CM

## 2013-01-24 DIAGNOSIS — I200 Unstable angina: Secondary | ICD-10-CM

## 2013-01-24 DIAGNOSIS — I25810 Atherosclerosis of coronary artery bypass graft(s) without angina pectoris: Secondary | ICD-10-CM

## 2013-01-24 NOTE — Assessment & Plan Note (Signed)
Recent admit for [place] undergoing stent to distal VG-RCA.  No further angina. On Effient, needs authorization.

## 2013-01-24 NOTE — Assessment & Plan Note (Signed)
Instructed to follow up with Dr. Arbutus Ped as instructed.

## 2013-01-24 NOTE — Assessment & Plan Note (Signed)
See below

## 2013-01-24 NOTE — Assessment & Plan Note (Signed)
Unable to add BB or ACE secondary to hypotension/borderline hypotension.  BP still borderline and with fatigue- with have pt see Dr. [name] in 1 month and meds may need to be added at that time.

## 2013-01-24 NOTE — Assessment & Plan Note (Signed)
Resolved since stent placed.

## 2013-01-24 NOTE — Progress Notes (Signed)
01/24/2013   PCP: [name], MD   Chief Complaint  Patient presents with  . Follow-up    post hosp, d/c'efd 8/23, cath with stenting; fatigued    Primary Cardiologist:  Dr. [name]  HPI: 62 year old, thin and frail-appearing, [name] whose husband Verdon Cummins is also a patient of ours. She is a mother of 2, grandmother to 2 grandchildren. She has a history of CAD status post coronary artery bypass grafting in 1995, as well as left subclavian artery PCI and stenting by Dr. [name] for subclavian steal. Her other problems include discontinued tobacco abuse January 2011, hypertension, dyslipidemia and GERD. Her last cath was performed Oct 03, 2009, revealing patent grafts. She has recently been diagnosed with non-small cell stage IIIA lung cancer diagnosed by biopsy performed by Dr. Dorris Fetch and has had chemo and radiation therapy. Dr. Shirline Frees follows her as an outpatient.   She is here today post hospitalization for [place].  Her cath revealed 95% stenosis to distal VG-RCA and underwent Xience DES.  Today she denies chest pain but has chronic shortness of breath.   She is fatigued today.  She has the fatigue periodically.  She is taking her Effient.   Allergies  Allergen Reactions  . Amiodarone     Drops HR too low  . Clopidogrel Bisulfate     REACTION: hives  . Dilaudid [Hydromorphone Hcl] Other (See Comments)    "I couldn't function for 2 days."    Current Outpatient Prescriptions  Medication Sig Dispense Refill  . albuterol (PROVENTIL HFA;VENTOLIN HFA) 108 (90 BASE) MCG/ACT inhaler Inhale 2 puffs into the lungs every 6 (six) hours as needed. For wheezing      . aspirin 81 MG EC tablet Take 81 mg by mouth daily.        Marland Kitchen atorvastatin (LIPITOR) 80 MG tablet Take 80 mg by mouth at bedtime.      . Cholecalciferol (VITAMIN D-3) 5000 UNITS TABS Take 1 tablet by mouth daily.      Marland Kitchen ezetimibe (ZETIA) 10 MG tablet Take 10 mg by mouth daily.        .  famotidine (PEPCID) 20 MG tablet Take 20 mg by mouth at bedtime.      Marland Kitchen HYDROcodone-acetaminophen (NORCO) 10-325 MG per tablet Take 2 tablets by mouth every 4 (four) hours as needed for pain.      . nitroGLYCERIN (NITROSTAT) 0.4 MG SL tablet Place 0.4 mg under the tongue every 5 (five) minutes as needed. For chest pain      . omeprazole (PRILOSEC) 20 MG capsule Take 20 mg by mouth 2 (two) times daily.        . prasugrel (EFFIENT) 10 MG TABS tablet Take 1 tablet (10 mg total) by mouth daily.  30 tablet  11  . SINGULAIR 10 MG tablet TAKE 1 TABLET EACH EVENING RO PREVENT COUGH OR WHEEZE  30 tablet  2  . SPIRIVA HANDIHALER 18 MCG inhalation capsule PLACE 1 CAPSULE INTO INHALER AND INHALE CONTENTS ONCE DAILY  30 capsule  5  . SYMBICORT 160-4.5 MCG/ACT inhaler INHALE 2 PUFFS INTO THE LUNGS 2 TIMES DAILY.  1 Inhaler  5   No current facility-administered medications for this visit.    Past Medical History  Diagnosis Date  . Acute myocardial infarction, unspecified site, episode of care unspecified   . Esophageal dysmotility   . Hemorrhoids   . Esophageal stricture   . Diaphragmatic hernia without mention of obstruction or  gangrene   . 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 CAD (coronary artery disease)     CABG'96., Xience DES -VG-RCA (01/12/13)  . Peripheral neuropathy   . Other and unspecified hyperlipidemia   . Peptic ulcer, unspecified site, unspecified as acute or chronic, without mention of hemorrhage, perforation, or obstruction   . Depressive disorder, not elsewhere classified   . Anxiety state, unspecified   . COPD (chronic obstructive pulmonary disease)   . GERD (gastroesophageal reflux disease)   . Candida esophagitis     on multiple occasions  . C. difficile diarrhea   . PVD (peripheral vascular disease)   . Headache(784.0)   . Weight loss 06/13/2011    scheduled here by family doctor  . CHF (congestive heart failure) 2011  . Asthma   . Hypertension   . Hx of  radiation therapy 02/28/12 -04/14/12    right lung    Past Surgical History  Procedure Laterality Date  . Tonsillectomy  1974  . Fracture lt jaw  1976  . Breast surgery  1969, 1970    bilateral  . Epigastric hernia repair  2008    Dr Dorris Fetch  . Coronary artery bypass graft  1995  . Carpal tunnel release    . Total abdominal hysterectomy  1998  . Laminectomy    . Coronary angioplasty      multiple  . Subclavian vein angioplasty / stenting    . Coronary angioplasty with stent placement  01/12/13    Xience DES to SVG-RCA distal    ZOX:WRUEAVW:UJ colds or fevers, weight continues to decrease.  Skin:no rashes or ulcers HEENT:no blurred vision, no congestion CV:see HPI PUL:see HPI GI:no diarrhea constipation or melena, no indigestion GU:no hematuria, no dysuria MS:no joint pain, no claudication Neuro:no syncope, no lightheadedness Endo:no diabetes, no thyroid disease  PHYSICAL EXAM BP 100/66  Pulse 58  Ht 5\' 7"  (1.702 m)  Wt 93 lb 6.4 oz (42.366 kg)  BMI 14.63 kg/m2 General:Pleasant affect, though flat today compared to her usual. NAD Skin:Warm and dry, brisk capillary refill HEENT:normocephalic, sclera clear, mucus membranes moist Neck:supple, no JVD, no bruits , no adenopathy Heart:S1S2 RRR without murmur, gallup, rub or click Lungs:clear without rales, rhonchi, or wheezes WJX:BJYN, non tender, + BS, do not palpate liver spleen or masses Ext:no lower ext edema,  2+ radial pulses Neuro:alert and oriented, MAE, follows commands, + facial symmetry  EKG:S Brady at 58 no acute changes.  ASSESSMENT AND PLAN Unstable angina Resolved since stent placed.  CAD- CABG '96. Last cath 2011, low risk Myoview 2/12 See below   CAD (coronary artery disease), autologous vein bypass graft, cath 01/12/13 with Xience DES to VG-RCA Recent admit for [place] undergoing stent to distal VG-RCA.  No further angina. On Effient, needs authorization.  Hypotension Unable to add BB or ACE  secondary to hypotension/borderline hypotension.  BP still borderline and with fatigue- with have pt see Dr. [name] in 1 month and meds may need to be added at that time.  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 Arbutus Ped as instructed.

## 2013-01-24 NOTE — Patient Instructions (Addendum)
Follow up with Dr. [name] in 1 month.  Call if any problems.  We will call for Effient authorization.

## 2013-01-30 ENCOUNTER — Other Ambulatory Visit: Payer: Self-pay | Admitting: Pulmonary Disease

## 2013-02-06 ENCOUNTER — Ambulatory Visit (HOSPITAL_COMMUNITY)
Admission: RE | Admit: 2013-02-06 | Discharge: 2013-02-06 | Disposition: A | Discharge status: Home / Self Care | Payer: Medicaid Other | Source: Ambulatory Visit | Attending: Internal Medicine | Admitting: Internal Medicine

## 2013-02-06 ENCOUNTER — Other Ambulatory Visit: Payer: Self-pay | Admitting: Pulmonary Disease

## 2013-02-06 ENCOUNTER — Other Ambulatory Visit (HOSPITAL_BASED_OUTPATIENT_CLINIC_OR_DEPARTMENT_OTHER): Payer: Medicaid Other

## 2013-02-06 ENCOUNTER — Other Ambulatory Visit: Payer: Medicaid Other | Admitting: Lab

## 2013-02-06 DIAGNOSIS — I2510 Atherosclerotic heart disease of native coronary artery without angina pectoris: Secondary | ICD-10-CM | POA: Insufficient documentation

## 2013-02-06 DIAGNOSIS — Z923 Personal history of irradiation: Secondary | ICD-10-CM | POA: Insufficient documentation

## 2013-02-06 DIAGNOSIS — C3410 Malignant neoplasm of upper lobe, unspecified bronchus or lung: Secondary | ICD-10-CM

## 2013-02-06 DIAGNOSIS — Z9221 Personal history of antineoplastic chemotherapy: Secondary | ICD-10-CM | POA: Insufficient documentation

## 2013-02-06 DIAGNOSIS — C3490 Malignant neoplasm of unspecified part of unspecified bronchus or lung: Secondary | ICD-10-CM | POA: Insufficient documentation

## 2013-02-06 DIAGNOSIS — R64 Cachexia: Secondary | ICD-10-CM | POA: Insufficient documentation

## 2013-02-06 DIAGNOSIS — I700 Atherosclerosis of aorta: Secondary | ICD-10-CM | POA: Insufficient documentation

## 2013-02-06 DIAGNOSIS — Z981 Arthrodesis status: Secondary | ICD-10-CM | POA: Insufficient documentation

## 2013-02-06 DIAGNOSIS — M439 Deforming dorsopathy, unspecified: Secondary | ICD-10-CM | POA: Insufficient documentation

## 2013-02-06 LAB — COMPREHENSIVE METABOLIC PANEL (CC13)
BUN: 11 mg/dL (ref 7.0–26.0)
CO2: 28 mEq/L (ref 22–29)
Calcium: 10.1 mg/dL (ref 8.4–10.4)
Chloride: 102 mEq/L (ref 98–109)
Creatinine: 0.7 mg/dL (ref 0.6–1.1)
Total Bilirubin: 0.32 mg/dL (ref 0.20–1.20)

## 2013-02-06 LAB — CBC WITH DIFFERENTIAL/PLATELET
Basophils Absolute: 0 10*3/uL (ref 0.0–0.1)
Eosinophils Absolute: 0.1 10*3/uL (ref 0.0–0.5)
HCT: 39.8 % (ref 34.8–46.6)
HGB: 13.8 g/dL (ref 11.6–15.9)
MONO#: 0.4 10*3/uL (ref 0.1–0.9)
NEUT%: 75.8 % (ref 38.4–76.8)
Platelets: 214 10*3/uL (ref 145–400)
WBC: 7.8 10*3/uL (ref 3.9–10.3)
lymph#: 1.4 10*3/uL (ref 0.9–3.3)

## 2013-02-06 MED ORDER — IOHEXOL 300 MG/ML  SOLN
80.0000 mL | Freq: Once | INTRAMUSCULAR | Status: AC | PRN
  Administered 2013-02-06: 80 mL via INTRAVENOUS

## 2013-02-06 NOTE — Telephone Encounter (Signed)
Please advise on refills. Thanks.

## 2013-02-07 NOTE — Progress Notes (Signed)
This encounter was created in error - please disregard.

## 2013-02-08 ENCOUNTER — Telehealth: Payer: Self-pay | Admitting: Internal Medicine

## 2013-02-08 ENCOUNTER — Ambulatory Visit (HOSPITAL_BASED_OUTPATIENT_CLINIC_OR_DEPARTMENT_OTHER): Payer: Medicaid Other | Admitting: Internal Medicine

## 2013-02-08 DIAGNOSIS — C3410 Malignant neoplasm of upper lobe, unspecified bronchus or lung: Secondary | ICD-10-CM

## 2013-02-08 NOTE — Telephone Encounter (Signed)
gv pt appt schedule for January 2015. [facility] will call pt w/ct appt - pt aware.

## 2013-02-08 NOTE — Progress Notes (Signed)
[facility] Telephone:([phone]) [phone]   Fax:([phone]  OFFICE PROGRESS NOTE  [name], MD [address] [facility] [place]. [place] [address]  DIAGNOSIS: Stage IIIa (T2a, N2, MX) non-small cell lung cancer, squamous cell carcinoma diagnosed in September of 2013.   PRIOR THERAPY: Status post a course of concurrent chemoradiation with chemotherapy in the form of weekly carboplatin for AUC of 2 and paclitaxel at 45 mg/m2. The paclitaxel was discontinued after cycle #5 secondary to peripheral neuropathy. Last dose was given on 04/10/2012 with partial response.  CURRENT THERAPY: None  INTERVAL HISTORY: [name] 62 y.o. female returns to the clinic today for follow up visit accompanied by her husband. The patient continues to have shortness of breath with exertion and mild cough. She denied having any significant chest pain or hemoptysis. The patient denied having any significant weight loss or night sweats. She has no nausea or vomiting. She had repeat CT scan of the chest performed recently and she is here for evaluation and discussion of her scan results. Unfortunately the patient continues to smoke and I strongly encouraged her to quit smoking.  MEDICAL HISTORY: Past Medical History  Diagnosis Date  . Acute myocardial infarction, unspecified site, episode of care unspecified   . Esophageal dysmotility   . Hemorrhoids   . Esophageal stricture   . Diaphragmatic hernia without mention of obstruction or gangrene   . 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 CAD (coronary artery disease)     CABG'96., Xience DES -VG-RCA (01/12/13)  . Peripheral neuropathy   . Other and unspecified hyperlipidemia   . Peptic ulcer, unspecified site, unspecified as acute or chronic, without mention of hemorrhage, perforation, or obstruction   . Depressive disorder, not elsewhere classified   . Anxiety state, unspecified   . COPD (chronic  obstructive pulmonary disease)   . GERD (gastroesophageal reflux disease)   . Candida esophagitis     on multiple occasions  . C. difficile diarrhea   . PVD (peripheral vascular disease)   . Headache(784.0)   . Weight loss 06/13/2011    scheduled here by family doctor  . CHF (congestive heart failure) 2011  . Asthma   . Hypertension   . Hx of radiation therapy 02/28/12 -04/14/12    right lung    ALLERGIES:  is allergic to amiodarone; clopidogrel bisulfate; and dilaudid.  MEDICATIONS:  Current Outpatient Prescriptions  Medication Sig Dispense Refill  . albuterol (PROVENTIL HFA;VENTOLIN HFA) 108 (90 BASE) MCG/ACT inhaler Inhale 2 puffs into the lungs every 6 (six) hours as needed. For wheezing      . aspirin 81 MG EC tablet Take 81 mg by mouth daily.        Marland Kitchen atorvastatin (LIPITOR) 80 MG tablet Take 80 mg by mouth at bedtime.      . Cholecalciferol (VITAMIN D-3) 5000 UNITS TABS Take 1 tablet by mouth daily.      Marland Kitchen ezetimibe (ZETIA) 10 MG tablet Take 10 mg by mouth daily.        . famotidine (PEPCID) 20 MG tablet Take 20 mg by mouth at bedtime.      . fluconazole (DIFLUCAN) 100 MG tablet TAKE 1 TABLET BY MOUTH EVERY DAY  7 tablet  0  . HYDROcodone-acetaminophen (NORCO) 10-325 MG per tablet Take 2 tablets by mouth every 4 (four) hours as needed for pain.      . nitroGLYCERIN (NITROSTAT) 0.4 MG SL tablet Place 0.4 mg  under the tongue every 5 (five) minutes as needed. For chest pain      . nystatin (MYCOSTATIN) 100000 UNIT/ML suspension GARGLE AND SWALLOW 1 TEASPOONFUL 4 TIMES A DAY  240 mL  0  . omeprazole (PRILOSEC) 20 MG capsule Take 20 mg by mouth 2 (two) times daily.        . prasugrel (EFFIENT) 10 MG TABS tablet Take 1 tablet (10 mg total) by mouth daily.  30 tablet  11  . SINGULAIR 10 MG tablet TAKE 1 TABLET EACH EVENING RO PREVENT COUGH OR WHEEZE  30 tablet  2  . SPIRIVA HANDIHALER 18 MCG inhalation capsule PLACE 1 CAPSULE INTO INHALER AND INHALE CONTENTS ONCE DAILY  30 capsule  5    . SYMBICORT 160-4.5 MCG/ACT inhaler INHALE 2 PUFFS INTO THE LUNGS 2 TIMES DAILY.  10.2 g  5   No current facility-administered medications for this visit.    SURGICAL HISTORY:  Past Surgical History  Procedure Laterality Date  . Tonsillectomy  1974  . Fracture lt jaw  1976  . Breast surgery  1969, 1970    bilateral  . Epigastric hernia repair  2008    Dr Dorris Fetch  . Coronary artery bypass graft  1995  . Carpal tunnel release    . Total abdominal hysterectomy  1998  . Laminectomy    . Coronary angioplasty      multiple  . Subclavian vein angioplasty / stenting    . Coronary angioplasty with stent placement  01/12/13    Xience DES to SVG-RCA distal    REVIEW OF SYSTEMS:  A comprehensive review of systems was negative except for: Respiratory: positive for cough and dyspnea on exertion   PHYSICAL EXAMINATION: General appearance: alert, cooperative and no distress Head: Normocephalic, without obvious abnormality, atraumatic Neck: no adenopathy, no JVD and thyroid not enlarged, symmetric, no tenderness/mass/nodules Lymph nodes: Cervical, supraclavicular, and axillary nodes normal. Resp: rhonchi bilaterally and wheezes bilaterally Cardio: regular rate and rhythm, S1, S2 normal, no murmur, click, rub or gallop GI: soft, non-tender; bowel sounds normal; no masses,  no organomegaly Extremities: extremities normal, atraumatic, no cyanosis or edema  ECOG PERFORMANCE STATUS: 1 - Symptomatic but completely ambulatory  Blood pressure 100/61, pulse 61, temperature 97.6 F (36.4 C), temperature source Oral, resp. rate 18, height 5\' 7"  (1.702 m), weight 94 lb 8 oz (42.865 kg).  LABORATORY DATA: Lab Results  Component Value Date   WBC 7.8 02/06/2013   HGB 13.8 02/06/2013   HCT 39.8 02/06/2013   MCV 96.8 02/06/2013   PLT 214 02/06/2013      Chemistry      Component Value Date/Time   NA 138 02/06/2013 1203   NA 142 01/13/2013 0630   K 4.4 02/06/2013 1203   K 3.6 01/13/2013 0630   CL  103 01/13/2013 0630   CL 104 11/07/2012 0839   CO2 28 02/06/2013 1203   CO2 27 01/13/2013 0630   BUN 11.0 02/06/2013 1203   BUN 8 01/13/2013 0630   CREATININE 0.7 02/06/2013 1203   CREATININE 0.81 01/13/2013 0630      Component Value Date/Time   CALCIUM 10.1 02/06/2013 1203   CALCIUM 9.8 01/13/2013 0630   ALKPHOS 108 02/06/2013 1203   ALKPHOS 106 02/07/2012 1227   AST 21 02/06/2013 1203   AST 18 02/07/2012 1227   ALT 15 02/06/2013 1203   ALT 13 02/07/2012 1227   BILITOT 0.32 02/06/2013 1203   BILITOT 0.5 02/07/2012 1227  RADIOGRAPHIC STUDIES: Ct Chest W Contrast  02/06/2013   CLINICAL DATA:  Lung cancer. Chemotherapy and radiation therapy completed.  EXAM: CT CHEST WITH CONTRAST  TECHNIQUE: Multidetector CT imaging of the chest was performed during intravenous contrast administration.  CONTRAST:  80mL OMNIPAQUE IOHEXOL 300 MG/ML  SOLN  COMPARISON:  Chest CT 11/07/2012 and 08/04/2012.  FINDINGS: No enlarged mediastinal, hilar or axillary lymph nodes are identified. The patient is cachectic with limited subcutaneous fat.  There is extensive atherosclerosis of the aorta, great vessels and coronary arteries. There is a left subclavian stent. Focal segmental occlusion of the proximal superior mesenteric artery (best seen on sagittal image 50) cannot be excluded. There is no pleural or pericardial effusion.  The spiculated right upper lobe suprahilar nodule has slightly retracted, measuring 10 x 8 mm on image 22 (previously 13 x 8 mm). Adjacent subpleural thickening and architectural distortion extending superiorly are similar to the prior study, probably related to radiation therapy. A 4 mm right lower lobe nodule on image 33 is stable. No enlarging pulmonary nodules are identified.  There is stable minimal thyroid nodularity. The visualized upper abdomen appears unremarkable. There are no worrisome osseous findings. Patient is status post thoracolumbar fusion. The right T10 pedicle screw extends lateral to  the vertebral body cortex but appears unchanged. There is a stable superior endplate compression deformity at T10.  IMPRESSION: 1. Slight retraction of right upper lobe nodule with similar adjacent parenchymal scarring, likely related to radiation therapy. 2. No evidence of local recurrence or metastatic disease.  3. Extensive atherosclerosis as described with possible proximal occlusion of the superior mesenteric artery.   Electronically Signed   By: Roxy Horseman   On: 02/06/2013 14:13   Dg Chest Port 1 View  01/11/2013   *RADIOLOGY REPORT*  Clinical Data: Chest pain, shortness of breath  PORTABLE CHEST - 1 VIEW  Comparison: Prior CT from 11/07/12.  Findings: Cardiac and mediastinal silhouettes are stable.  Median sternotomy wires of underlying CABG markers and surgical clips are again noted.  Extensive spinal fixation hardware involving the thoracolumbar spine is partially visualized.  The lungs are normally inflated.  No airspace consolidation, pleural effusion, or pulmonary edema is identified.  There is segmental atelectasis within the left lung base.  No acute osseous abnormality.  IMPRESSION: Segmental left lower lobe atelectasis.  Otherwise stable appearance of the chest with no acute cardiopulmonary process.   Original Report Authenticated By: Rise Mu, M.D.    ASSESSMENT AND PLAN: this is a very pleasant 62 years old white female with history of stage IIIA non-small cell lung cancer status post concurrent chemoradiation with weekly carboplatin and paclitaxel. She has been observation since November of 2013 was no evidence for disease progression. I discussed the scan results with the patient and her husband. I recommended for her to continue on observation with repeat CT scan of the chest in 4 months. She was advised to call immediately if she has any concerning symptoms in the interval.  For smoke cessation, I advised the patient to quit smoking and offered her smoke cessation program but  she declined at this time. The patient voices understanding of current disease status and treatment options and is in agreement with the current care plan.  All questions were answered. The patient knows to call the clinic with any problems, questions or concerns. We can certainly see the patient much sooner if necessary.

## 2013-02-10 ENCOUNTER — Encounter: Payer: Self-pay | Admitting: Internal Medicine

## 2013-02-10 NOTE — Patient Instructions (Signed)
Followup visit in 4 months with repeat CT scan of the chest. 

## 2013-02-18 ENCOUNTER — Encounter: Payer: Self-pay | Admitting: *Deleted

## 2013-02-23 ENCOUNTER — Ambulatory Visit: Payer: Medicaid Other | Admitting: Cardiovascular Disease

## 2013-02-23 ENCOUNTER — Other Ambulatory Visit: Payer: Self-pay | Admitting: Pulmonary Disease

## 2013-02-23 ENCOUNTER — Other Ambulatory Visit: Payer: Self-pay | Admitting: Internal Medicine

## 2013-03-16 ENCOUNTER — Ambulatory Visit: Payer: Medicaid Other | Admitting: Cardiovascular Disease

## 2013-04-02 ENCOUNTER — Ambulatory Visit: Payer: Medicaid Other | Admitting: Cardiovascular Disease

## 2013-04-02 ENCOUNTER — Encounter: Payer: Self-pay | Admitting: Cardiovascular Disease

## 2013-04-03 ENCOUNTER — Other Ambulatory Visit: Payer: Self-pay | Admitting: Pulmonary Disease

## 2013-04-03 NOTE — Telephone Encounter (Signed)
Please advise on refills. Thanks.

## 2013-04-06 ENCOUNTER — Ambulatory Visit (INDEPENDENT_AMBULATORY_CARE_PROVIDER_SITE_OTHER): Payer: Medicaid Other | Admitting: Cardiovascular Disease

## 2013-04-06 ENCOUNTER — Encounter: Payer: Self-pay | Admitting: Cardiovascular Disease

## 2013-04-06 VITALS — BP 119/72 | HR 58 | Ht 67.0 in | Wt 95.0 lb

## 2013-04-06 DIAGNOSIS — E785 Hyperlipidemia, unspecified: Secondary | ICD-10-CM

## 2013-04-06 DIAGNOSIS — I25810 Atherosclerosis of coronary artery bypass graft(s) without angina pectoris: Secondary | ICD-10-CM

## 2013-04-06 DIAGNOSIS — Z23 Encounter for immunization: Secondary | ICD-10-CM

## 2013-04-06 DIAGNOSIS — I219 Acute myocardial infarction, unspecified: Secondary | ICD-10-CM

## 2013-04-06 NOTE — Assessment & Plan Note (Signed)
Well-controlled on current medications 

## 2013-04-06 NOTE — Progress Notes (Signed)
04/06/2013 [name]   [birth year]  [id number]  Primary Physician Whitney Sauer, MD Primary Cardiologist: Whitney Gess MD [name]   HPI:  The patient is an unfortunate 62 year old, thin and frail-appearing, [name] whose husband [name] is also a patient of mine. She is a mother of 2, grandmother to 2 grandchildren. I last saw her 6 months ago and have been taking care of her since the early 1990s. She has a history of CAD status post coronary artery bypass grafting in 1995, as well as left subclavian artery PCI and stenting by myself for subclavian steal. Her other problems include discontinued tobacco abuse January 2011, hypertension, dyslipidemia and GERD. Her last cath was performed Oct 03, 2009, revealing patent grafts. She has recently been diagnosed with non-small cell stage IIIA lung cancer diagnosed by biopsy performed by Dr. Dorris [name] and has had chemo and radiation therapy. Dr. Shirline [name] follows her as an outpatient. She denies chest pain but has chronic shortness of breath. Her last Myoview performed 07/14/2010 was nonischemic. She was admitted to hospital in August with unstable angina and underwent cardiac catheterization by Dr. Bryan [name] referring patent grafts with a high-grade stenosis in her RCA SVG which was stented with a drug-eluting stent. She's been asymptomatic since.    Current Outpatient Prescriptions  Medication Sig Dispense Refill  . albuterol (PROVENTIL HFA;VENTOLIN HFA) 108 (90 BASE) MCG/ACT inhaler Inhale 2 puffs into the lungs every 6 (six) hours as needed. For wheezing      . aspirin 81 MG EC tablet Take 81 mg by mouth daily.        Marland Kitchen atorvastatin (LIPITOR) 80 MG tablet Take 80 mg by mouth at bedtime.      . Cholecalciferol (VITAMIN D-3) 5000 UNITS TABS Take 1 tablet by mouth daily.      Marland Kitchen ezetimibe (ZETIA) 10 MG tablet Take 10 mg by mouth daily.        . famotidine (PEPCID) 20 MG tablet Take 20 mg by  mouth at bedtime.      . fluconazole (DIFLUCAN) 100 MG tablet TAKE 1 TABLET BY MOUTH EVERY DAY  7 tablet  0  . HYDROcodone-acetaminophen (NORCO) 10-325 MG per tablet Take 2 tablets by mouth every 4 (four) hours as needed for pain.      . nitroGLYCERIN (NITROSTAT) 0.4 MG SL tablet Place 0.4 mg under the tongue every 5 (five) minutes as needed. For chest pain      . nystatin (MYCOSTATIN) 100000 UNIT/ML suspension GARGLE AND SWALLOW 1 TEASPOONFUL 4 TIMES A DAY  240 mL  0  . omeprazole (PRILOSEC) 20 MG capsule Take 20 mg by mouth 2 (two) times daily.        . prasugrel (EFFIENT) 10 MG TABS tablet Take 1 tablet (10 mg total) by mouth daily.  30 tablet  11  . PROAIR HFA 108 (90 BASE) MCG/ACT inhaler INHALE 2 PUFFS INTO THE LUNGS EVERY 6 HOURS AS NEEDED FOR WHEEZING.  8.5 each  1  . SINGULAIR 10 MG tablet TAKE 1 TABLET EACH EVENING RO PREVENT COUGH OR WHEEZE  30 tablet  2  . SPIRIVA HANDIHALER 18 MCG inhalation capsule PLACE 1 CAPSULE INTO INHALER AND INHALE CONTENTS INTO THE LUNGS ONCE DAILY  30 capsule  5  . SYMBICORT 160-4.5 MCG/ACT inhaler INHALE 2 PUFFS INTO THE LUNGS 2 TIMES DAILY.  10.2 g  5   No current facility-administered medications for this visit.    Allergies  Allergen  Reactions  . Amiodarone     Drops HR too low, SOB, fatigue  . Clopidogrel Bisulfate     REACTION: hives  . Dilaudid [Hydromorphone Hcl] Other (See Comments)    "I couldn't function for 2 days."  . Multaq [Dronedarone]     History   Social History  . Marital Status: Married    Spouse Name: N/A    Number of Children: 2  . Years of Education: N/A   Occupational History  . disabled     back problems   Social History Main Topics  . Smoking status: Former Smoker -- 1.50 packs/day for 30 years    Types: Cigarettes  . Smokeless tobacco: Never Used  . Alcohol Use: No  . Drug Use: No  . Sexual Activity: Not on file   Other Topics Concern  . Not on file   Social History Narrative   Married with 2 children,  husband has Alzheimer's dementia, Owner of catering service, Quit smoking in 1995      Review of Systems: General: negative for chills, fever, night sweats or weight changes.  Cardiovascular: negative for chest pain, dyspnea on exertion, edema, orthopnea, palpitations, paroxysmal nocturnal dyspnea or shortness of breath Dermatological: negative for rash Respiratory: negative for cough or wheezing Urologic: negative for hematuria Abdominal: negative for nausea, vomiting, diarrhea, bright red blood per rectum, melena, or hematemesis Neurologic: negative for visual changes, syncope, or dizziness All other systems reviewed and are otherwise negative except as noted above.    Blood pressure 119/72, pulse 58, height 5\' 7"  (1.702 m), weight 95 lb (43.092 kg).  General appearance: alert and no distress Neck: no adenopathy, no carotid bruit, no JVD, supple, symmetrical, trachea midline and thyroid not enlarged, symmetric, no tenderness/mass/nodules Lungs: clear to auscultation bilaterally Heart: regular rate and rhythm, S1, S2 normal, no murmur, click, rub or gallop Extremities: extremities normal, atraumatic, no cyanosis or edema  EKG sinus bradycardia at 58 without ST or T wave changes  ASSESSMENT AND PLAN:   CAD (coronary artery disease), autologous vein bypass graft, cath 01/12/13 with Xience DES to VG-RCA Status post coronary bypass graft in 1995. She's had left subclavian artery stenting as well for subclavian steal. She had . Cardiac catheterization may 2011 revealing patent grafts. She was admitted with unstable angina several months ago and underwent cath by Dr. Bryan [name] revealing a high-grade stenosis in the RCA SVG which he stented with a drug-eluting stent. She's had no recurrent symptoms.  Hypertension Well-controlled on current medications  HYPERLIPIDEMIA On statin for therapy followed by her PCP      Whitney Gess MD [facility], [facility] 04/06/2013 11:29 AM

## 2013-04-06 NOTE — Patient Instructions (Signed)
Your physician wants you to follow-up in: 6 months with Vernona Rieger and 1 year with Dr [name]. You will receive a reminder letter in the mail two months in advance. If you don't receive a letter, please call our office to schedule the follow-up appointment.

## 2013-04-06 NOTE — Assessment & Plan Note (Signed)
Status post coronary bypass graft in 1995. She's had left subclavian artery stenting as well for subclavian steal. She had . Cardiac catheterization may 2011 revealing patent grafts. She was admitted with unstable angina several months ago and underwent cath by Dr. Bryan Lemma revealing a high-grade stenosis in the RCA SVG which he stented with a drug-eluting stent. She's had no recurrent symptoms.

## 2013-04-06 NOTE — Assessment & Plan Note (Signed)
On statin for therapy followed by her PCP

## 2013-04-16 ENCOUNTER — Telehealth: Payer: Self-pay | Admitting: Pulmonary Disease

## 2013-04-16 MED ORDER — NYSTATIN 100000 UNIT/ML MT SUSP: OROMUCOSAL | Status: DC

## 2013-04-16 MED ORDER — FLUCONAZOLE 100 MG PO TABS: ORAL_TABLET | ORAL | Status: DC

## 2013-04-16 NOTE — Telephone Encounter (Signed)
Spoke with pt and encouraged to rinse mouth and brush tongue after use of inhalers.  Refills sent to pharmacy.

## 2013-05-18 ENCOUNTER — Telehealth: Payer: Self-pay | Admitting: Pulmonary Disease

## 2013-05-18 ENCOUNTER — Other Ambulatory Visit: Payer: Self-pay | Admitting: Pulmonary Disease

## 2013-05-18 MED ORDER — FLUCONAZOLE 150 MG PO TABS: 150.0000 mg | ORAL_TABLET | Freq: Every day | ORAL | Status: DC

## 2013-05-18 MED ORDER — DOXYCYCLINE HYCLATE 100 MG PO TABS: ORAL_TABLET | ORAL | Status: DC

## 2013-05-18 NOTE — Telephone Encounter (Signed)
Offer doxycycline 100 mg, # 8, 2 today then one daily. Mucinex DM and lots of fluids

## 2013-05-18 NOTE — Telephone Encounter (Signed)
Called and spoke with pt and she  Is aware of meds sent in per CY.  This med has been sent to the pharmacy.  Pt stated that she will need something called in for the thrush.  Per CY---ok to send in diflucan 150 mg  #7  1 daily.  Pt is aware that this has been sent to the pharmacy and nothing further is needed.

## 2013-05-18 NOTE — Telephone Encounter (Signed)
I spoke with pt. She c/o chest congestion, cough w/ yellow phlem, wheezing, chest tx, SOB at night d/t coughing, white patches throat/tongue/ denies any f/c/s/n/v. She is not currently taking anything OTC for symptoms. She wants something called in for her thrush as well. Please advise Dr. Maple Hudson as Dr. Craige Cotta is on vacation. Thanks  Allergies  Allergen Reactions  . Amiodarone     Drops HR too low, SOB, fatigue  . Clopidogrel Bisulfate     REACTION: hives  . Dilaudid [Hydromorphone Hcl] Other (See Comments)    "I couldn't function for 2 days."  . Multaq [Dronedarone]     Current Outpatient Prescriptions on File Prior to Visit  Medication Sig Dispense Refill  . albuterol (PROVENTIL HFA;VENTOLIN HFA) 108 (90 BASE) MCG/ACT inhaler Inhale 2 puffs into the lungs every 6 (six) hours as needed. For wheezing      . aspirin 81 MG EC tablet Take 81 mg by mouth daily.        Marland Kitchen atorvastatin (LIPITOR) 80 MG tablet Take 80 mg by mouth at bedtime.      . Cholecalciferol (VITAMIN D-3) 5000 UNITS TABS Take 1 tablet by mouth daily.      Marland Kitchen ezetimibe (ZETIA) 10 MG tablet Take 10 mg by mouth daily.        . famotidine (PEPCID) 20 MG tablet Take 20 mg by mouth at bedtime.      . fluconazole (DIFLUCAN) 100 MG tablet TAKE 1 TABLET BY MOUTH EVERY DAY  7 tablet  0  . HYDROcodone-acetaminophen (NORCO) 10-325 MG per tablet Take 2 tablets by mouth every 4 (four) hours as needed for pain.      . nitroGLYCERIN (NITROSTAT) 0.4 MG SL tablet Place 0.4 mg under the tongue every 5 (five) minutes as needed. For chest pain      . nystatin (MYCOSTATIN) 100000 UNIT/ML suspension GARGLE AND SWALLOW 1 TEASPOONFUL 4 TIMES A DAY  240 mL  0  . omeprazole (PRILOSEC) 20 MG capsule Take 20 mg by mouth 2 (two) times daily.        . prasugrel (EFFIENT) 10 MG TABS tablet Take 1 tablet (10 mg total) by mouth daily.  30 tablet  11  . PROAIR HFA 108 (90 BASE) MCG/ACT inhaler INHALE 2 PUFFS INTO THE LUNGS EVERY 6 HOURS AS NEEDED FOR WHEEZING.  8.5  each  1  . SINGULAIR 10 MG tablet TAKE 1 TABLET EACH EVENING RO PREVENT COUGH OR WHEEZE  30 tablet  2  . SPIRIVA HANDIHALER 18 MCG inhalation capsule PLACE 1 CAPSULE INTO INHALER AND INHALE CONTENTS INTO THE LUNGS ONCE DAILY  30 capsule  5  . SYMBICORT 160-4.5 MCG/ACT inhaler INHALE 2 PUFFS INTO THE LUNGS 2 TIMES DAILY.  10.2 g  5   No current facility-administered medications on file prior to visit.

## 2013-05-21 ENCOUNTER — Telehealth: Payer: Self-pay | Admitting: Cardiology

## 2013-05-21 ENCOUNTER — Other Ambulatory Visit: Payer: Self-pay | Admitting: Cardiology

## 2013-05-21 ENCOUNTER — Other Ambulatory Visit: Payer: Self-pay | Admitting: *Deleted

## 2013-05-21 NOTE — Telephone Encounter (Signed)
Ms. Vannatter was with her husband for his appt. Today and she complained of severe global headache last pm.  Woke her from sleep and severe.  Two advil with relief, but she is quite concerned about pain.  I offered CT of her head, she has hardware in her back that would be a problem with MRI.  I ordered CT with and without contrast.  We will have her follow up with Dr. [name] if abnormal.

## 2013-05-21 NOTE — Telephone Encounter (Signed)
Please advise on refill. Thanks. 

## 2013-05-23 ENCOUNTER — Ambulatory Visit
Admission: RE | Admit: 2013-05-23 | Discharge: 2013-05-23 | Disposition: A | Discharge status: Home / Self Care | Payer: Medicaid Other | Source: Ambulatory Visit | Attending: Cardiology | Admitting: Cardiology

## 2013-05-23 MED ORDER — IOHEXOL 300 MG/ML  SOLN
75.0000 mL | Freq: Once | INTRAMUSCULAR | Status: AC | PRN
  Administered 2013-05-23: 75 mL via INTRAVENOUS

## 2013-06-04 ENCOUNTER — Encounter: Payer: Self-pay | Admitting: Pulmonary Disease

## 2013-06-04 ENCOUNTER — Other Ambulatory Visit: Payer: Self-pay | Admitting: Pulmonary Disease

## 2013-06-04 ENCOUNTER — Ambulatory Visit (INDEPENDENT_AMBULATORY_CARE_PROVIDER_SITE_OTHER): Payer: Medicaid Other | Admitting: Pulmonary Disease

## 2013-06-04 VITALS — BP 102/60 | HR 90 | Ht 67.0 in | Wt 98.0 lb

## 2013-06-04 DIAGNOSIS — J438 Other emphysema: Secondary | ICD-10-CM

## 2013-06-04 DIAGNOSIS — J439 Emphysema, unspecified: Secondary | ICD-10-CM

## 2013-06-04 NOTE — Patient Instructions (Signed)
Follow up in 6 months 

## 2013-06-04 NOTE — Progress Notes (Signed)
Chief Complaint  Patient presents with  . COPD    Breathing is unchanged. Reports SOB, coughing with production of thick yellow mucus, chest tightness and wheezing.    History of Present Illness: [name] is a 63 y.o. female former smoker with COPD.  She has hx of NSCLC s/p chemoradiation therapy.  She still has a cough and chest congestion.  She was treated with doxycycline for a chest cold in December >> this helped.  She is still bringing up clear to yellow sputum.  She denies fever or hemoptysis.  She has noticed more wheezing at night.  She uses albuterol 3 to 4 times per week, but was using more during end of December.  She felt worse after trying to stop singulair, and as a result continues to use this.  She is schedule for CT chest tomorrow, and will then f/u with Dr. Julien Nordmann after.  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 difficile diarrhea; PVD (peripheral vascular disease); Headache(784.0); Weight loss (06/13/2011); CHF (congestive heart failure) (2011); Asthma; Hypertension; History of nuclear stress test (07/14/2010); History of tobacco use; Non-small cell carcinoma of lung, stage 3; and History of radiation therapy  (02/28/2012-04/14/2012).  [name]  has past surgical history that includes Tonsillectomy (1974); Fracture lt jaw (1976); Breast surgery (1969, 1970); Epigastric hernia repair (2008); Coronary artery bypass graft (02/01/1994); Carpal tunnel release; Total abdominal hysterectomy (1998); Laminectomy; Subclavian vein angioplasty / stenting (Left); transthoracic echocardiogram (08/14/2009); Coronary angioplasty with stent (08/29/2000); Cardiac catheterization (01/17/2002); Cardiac catheterization (02/13/2004); Cardiac catheterization (07/14/2005); Cardiac catheterization (09/26/2008); Cardiac catheterization (05/31/2009); Cardiac catheterization (10/03/2009); Coronary angioplasty with stent (01/12/2013); and Carotid Doppler (08/14/2009).  Prior to Admission medications   Medication Sig Start Date End Date Taking? Authorizing Provider  albuterol (PROVENTIL HFA;VENTOLIN HFA) 108 (90 BASE) MCG/ACT inhaler Inhale 2 puffs into the lungs every 6 (six) hours as needed. For wheezing    Historical Provider, MD  aspirin 81 MG EC tablet Take 81 mg by mouth daily.      Historical Provider, MD  atorvastatin (LIPITOR) 80 MG tablet Take 80 mg by mouth at bedtime.    Historical Provider, MD  Cholecalciferol (VITAMIN D-3) 5000 UNITS TABS Take 1 tablet by mouth daily.    Historical Provider, MD  ezetimibe (ZETIA) 10 MG tablet Take 10 mg by mouth daily.      Historical Provider, MD  famotidine (PEPCID) 20 MG tablet Take 20 mg by mouth at bedtime.    Historical Provider, MD  HYDROcodone-acetaminophen (NORCO) 10-325 MG per tablet Take 2 tablets by mouth every 4 (four) hours as needed for pain.    Historical Provider, MD  mirtazapine (REMERON) 30 MG tablet Take 1 tablet (30 mg total) by mouth at bedtime. 11/10/12   Curt Bears, MD  montelukast (SINGULAIR) 10 MG tablet Take 10 mg by mouth at bedtime.      Historical Provider, MD  nebivolol (BYSTOLIC) 2.5  MG tablet Take 1 tablet (2.5 mg total) by mouth daily. 08/25/11 08/24/12  Tammy S  Parrett, NP  nitroGLYCERIN (NITROSTAT) 0.4 MG SL tablet Place 0.4 mg under the tongue every 5 (five) minutes as needed. For chest pain    Historical Provider, MD  nystatin (MYCOSTATIN) 100000 UNIT/ML suspension GARGLE AND SWALLOW 1 TEASPOONFUL 4 TIMES A DAY 12/01/12   Chesley Mires, MD  omeprazole (PRILOSEC) 20 MG capsule Take 20 mg by mouth 2 (two) times daily.      Historical Provider, MD  prasugrel (EFFIENT) 10 MG TABS Take 10 mg by mouth daily.     Historical Provider, MD  predniSONE (DELTASONE) 10 MG tablet Take 3 tablets for 2 days, 2 tablets for 2 days then one tablet for 2 days then stop. 11/29/12   Chesley Mires, MD  SPIRIVA HANDIHALER 18 MCG inhalation capsule PLACE 1 CAPSULE INTO INHALER AND INHALE CONTENTS ONCE DAILY 07/14/12   Chesley Mires, MD  SYMBICORT 160-4.5 MCG/ACT inhaler INHALE 2 PUFFS INTO THE LUNGS 2 TIMES DAILY. 05/29/12   Chesley Mires, MD    Allergies  Allergen Reactions  . Amiodarone     Drops HR too low, SOB, fatigue  . Clopidogrel Bisulfate     REACTION: hives  . Dilaudid [Hydromorphone Hcl] Other (See Comments)    "I couldn't function for 2 days."  . Multaq [Dronedarone]      Physical Exam:  General - No distress, thin ENT - No sinus tenderness,no oral exudate, no LAN Cardiac - s1s2 regular, no murmur Chest - Prolonged exhalation, no wheeze/rales/dullness Back - No focal tenderness Abd - Soft, non-tender Ext - No edema Neuro - Normal strength Skin - No rashes Psych - normal mood, and behavior   Assessment/Plan:  Chesley Mires, MD [facility] Pager:  [phone]

## 2013-06-04 NOTE — Assessment & Plan Note (Addendum)
She had recent exacerbation, and is slowly improving from this.  I don't think she needs additional antibiotics, and she does not need prednisone at this time.  Advised her to trying changing spiriva to before bedtime to determine if this improves her nocturnal symptoms.    She did not tolerate attempted d/c of singulair.  She is to continue symbicort.

## 2013-06-05 ENCOUNTER — Other Ambulatory Visit (HOSPITAL_BASED_OUTPATIENT_CLINIC_OR_DEPARTMENT_OTHER): Payer: Medicaid Other

## 2013-06-05 ENCOUNTER — Ambulatory Visit (HOSPITAL_COMMUNITY)
Admission: RE | Admit: 2013-06-05 | Discharge: 2013-06-05 | Disposition: A | Discharge status: Home / Self Care | Payer: Medicaid Other | Source: Ambulatory Visit | Attending: Internal Medicine | Admitting: Internal Medicine

## 2013-06-05 DIAGNOSIS — C3410 Malignant neoplasm of upper lobe, unspecified bronchus or lung: Secondary | ICD-10-CM

## 2013-06-05 DIAGNOSIS — R918 Other nonspecific abnormal finding of lung field: Secondary | ICD-10-CM | POA: Diagnosis not present

## 2013-06-05 DIAGNOSIS — Z923 Personal history of irradiation: Secondary | ICD-10-CM | POA: Diagnosis not present

## 2013-06-05 DIAGNOSIS — I700 Atherosclerosis of aorta: Secondary | ICD-10-CM | POA: Diagnosis not present

## 2013-06-05 DIAGNOSIS — C3490 Malignant neoplasm of unspecified part of unspecified bronchus or lung: Secondary | ICD-10-CM | POA: Diagnosis not present

## 2013-06-05 DIAGNOSIS — I7090 Unspecified atherosclerosis: Secondary | ICD-10-CM | POA: Insufficient documentation

## 2013-06-05 DIAGNOSIS — Z981 Arthrodesis status: Secondary | ICD-10-CM | POA: Insufficient documentation

## 2013-06-05 DIAGNOSIS — I77810 Thoracic aortic ectasia: Secondary | ICD-10-CM | POA: Insufficient documentation

## 2013-06-05 DIAGNOSIS — Z9221 Personal history of antineoplastic chemotherapy: Secondary | ICD-10-CM | POA: Insufficient documentation

## 2013-06-05 DIAGNOSIS — R911 Solitary pulmonary nodule: Secondary | ICD-10-CM | POA: Insufficient documentation

## 2013-06-05 LAB — CBC WITH DIFFERENTIAL/PLATELET
BASO%: 0.8 % (ref 0.0–2.0)
Basophils Absolute: 0.1 10*3/uL (ref 0.0–0.1)
EOS%: 1.7 % (ref 0.0–7.0)
Eosinophils Absolute: 0.1 10*3/uL (ref 0.0–0.5)
HCT: 40.2 % (ref 34.8–46.6)
HEMOGLOBIN: 13.7 g/dL (ref 11.6–15.9)
LYMPH%: 16.8 % (ref 14.0–49.7)
MCH: 33.3 pg (ref 25.1–34.0)
MCHC: 34 g/dL (ref 31.5–36.0)
MCV: 97.9 fL (ref 79.5–101.0)
MONO#: 0.6 10*3/uL (ref 0.1–0.9)
MONO%: 7.9 % (ref 0.0–14.0)
NEUT%: 72.8 % (ref 38.4–76.8)
NEUTROS ABS: 5.2 10*3/uL (ref 1.5–6.5)
PLATELETS: 276 10*3/uL (ref 145–400)
RBC: 4.11 10*6/uL (ref 3.70–5.45)
RDW: 12.8 % (ref 11.2–14.5)
WBC: 7.1 10*3/uL (ref 3.9–10.3)
lymph#: 1.2 10*3/uL (ref 0.9–3.3)

## 2013-06-05 LAB — COMPREHENSIVE METABOLIC PANEL (CC13)
ALBUMIN: 4.1 g/dL (ref 3.5–5.0)
ALK PHOS: 108 U/L (ref 40–150)
ALT: 16 U/L (ref 0–55)
AST: 19 U/L (ref 5–34)
Anion Gap: 11 mEq/L (ref 3–11)
BUN: 12 mg/dL (ref 7.0–26.0)
CO2: 26 mEq/L (ref 22–29)
CREATININE: 0.8 mg/dL (ref 0.6–1.1)
Calcium: 9.6 mg/dL (ref 8.4–10.4)
Chloride: 105 mEq/L (ref 98–109)
GLUCOSE: 87 mg/dL (ref 70–140)
Potassium: 4.2 mEq/L (ref 3.5–5.1)
Sodium: 143 mEq/L (ref 136–145)
Total Bilirubin: 0.52 mg/dL (ref 0.20–1.20)
Total Protein: 7.2 g/dL (ref 6.4–8.3)

## 2013-06-05 MED ORDER — IOHEXOL 300 MG/ML  SOLN
80.0000 mL | Freq: Once | INTRAMUSCULAR | Status: AC | PRN
  Administered 2013-06-05: 80 mL via INTRAVENOUS

## 2013-06-07 ENCOUNTER — Telehealth: Payer: Self-pay | Admitting: Internal Medicine

## 2013-06-07 ENCOUNTER — Ambulatory Visit (HOSPITAL_BASED_OUTPATIENT_CLINIC_OR_DEPARTMENT_OTHER): Payer: Medicaid Other | Admitting: Internal Medicine

## 2013-06-07 ENCOUNTER — Encounter: Payer: Self-pay | Admitting: Internal Medicine

## 2013-06-07 VITALS — BP 105/64 | HR 71 | Temp 97.9°F | Resp 18 | Ht 67.0 in | Wt 98.2 lb

## 2013-06-07 DIAGNOSIS — F17200 Nicotine dependence, unspecified, uncomplicated: Secondary | ICD-10-CM

## 2013-06-07 DIAGNOSIS — C3410 Malignant neoplasm of upper lobe, unspecified bronchus or lung: Secondary | ICD-10-CM

## 2013-06-07 NOTE — Telephone Encounter (Signed)
lvm for pt regarding to May appts...mailed pt appt sched/avs and letter

## 2013-06-07 NOTE — Patient Instructions (Signed)
Smoking Cessation Quitting smoking is important to your health and has many advantages. However, it is not always easy to quit since nicotine is a very addictive drug. Often times, people try 3 times or more before being able to quit. This document explains the best ways for you to prepare to quit smoking. Quitting takes hard work and a lot of effort, but you can do it. ADVANTAGES OF QUITTING SMOKING  You will live longer, feel better, and live better.  Your body will feel the impact of quitting smoking almost immediately.  Within 20 minutes, blood pressure decreases. Your pulse returns to its normal level.  After 8 hours, carbon monoxide levels in the blood return to normal. Your oxygen level increases.  After 24 hours, the chance of having a heart attack starts to decrease. Your breath, hair, and body stop smelling like smoke.  After 48 hours, damaged nerve endings begin to recover. Your sense of taste and smell improve.  After 72 hours, the body is virtually free of nicotine. Your bronchial tubes relax and breathing becomes easier.  After 2 to 12 weeks, lungs can hold more air. Exercise becomes easier and circulation improves.  The risk of having a heart attack, stroke, cancer, or lung disease is greatly reduced.  After 1 year, the risk of coronary heart disease is cut in half.  After 5 years, the risk of stroke falls to the same as a nonsmoker.  After 10 years, the risk of lung cancer is cut in half and the risk of other cancers decreases significantly.  After 15 years, the risk of coronary heart disease drops, usually to the level of a nonsmoker.  If you are pregnant, quitting smoking will improve your chances of having a healthy baby.  The people you live with, especially any children, will be healthier.  You will have extra money to spend on things other than cigarettes. QUESTIONS TO THINK ABOUT BEFORE ATTEMPTING TO QUIT You may want to talk about your answers with your  caregiver.  Why do you want to quit?  If you tried to quit in the past, what helped and what did not?  What will be the most difficult situations for you after you quit? How will you plan to handle them?  Who can help you through the tough times? Your family? Friends? A caregiver?  What pleasures do you get from smoking? What ways can you still get pleasure if you quit? Here are some questions to ask your caregiver:  How can you help me to be successful at quitting?  What medicine do you think would be best for me and how should I take it?  What should I do if I need more help?  What is smoking withdrawal like? How can I get information on withdrawal? GET READY  Set a quit date.  Change your environment by getting rid of all cigarettes, ashtrays, matches, and lighters in your home, car, or work. Do not let people smoke in your home.  Review your past attempts to quit. Think about what worked and what did not. GET SUPPORT AND ENCOURAGEMENT You have a better chance of being successful if you have help. You can get support in many ways.  Tell your family, friends, and co-workers that you are going to quit and need their support. Ask them not to smoke around you.  Get individual, group, or telephone counseling and support. Programs are available at local hospitals and health centers. Call your local health department for   information about programs in your area.  Spiritual beliefs and practices may help some smokers quit.  Download a "quit meter" on your computer to keep track of quit statistics, such as how long you have gone without smoking, cigarettes not smoked, and money saved.  Get a self-help book about quitting smoking and staying off of tobacco. LEARN NEW SKILLS AND BEHAVIORS  Distract yourself from urges to smoke. Talk to someone, go for a walk, or occupy your time with a task.  Change your normal routine. Take a different route to work. Drink tea instead of coffee.  Eat breakfast in a different place.  Reduce your stress. Take a hot bath, exercise, or read a book.  Plan something enjoyable to do every day. Reward yourself for not smoking.  Explore interactive web-based programs that specialize in helping you quit. GET MEDICINE AND USE IT CORRECTLY Medicines can help you stop smoking and decrease the urge to smoke. Combining medicine with the above behavioral methods and support can greatly increase your chances of successfully quitting smoking.  Nicotine replacement therapy helps deliver nicotine to your body without the negative effects and risks of smoking. Nicotine replacement therapy includes nicotine gum, lozenges, inhalers, nasal sprays, and skin patches. Some may be available over-the-counter and others require a prescription.  Antidepressant medicine helps people abstain from smoking, but how this works is unknown. This medicine is available by prescription.  Nicotinic receptor partial agonist medicine simulates the effect of nicotine in your brain. This medicine is available by prescription. Ask your caregiver for advice about which medicines to use and how to use them based on your health history. Your caregiver will tell you what side effects to look out for if you choose to be on a medicine or therapy. Carefully read the information on the package. Do not use any other product containing nicotine while using a nicotine replacement product.  RELAPSE OR DIFFICULT SITUATIONS Most relapses occur within the first 3 months after quitting. Do not be discouraged if you start smoking again. Remember, most people try several times before finally quitting. You may have symptoms of withdrawal because your body is used to nicotine. You may crave cigarettes, be irritable, feel very hungry, cough often, get headaches, or have difficulty concentrating. The withdrawal symptoms are only temporary. They are strongest when you first quit, but they will go away within  10 14 days. To reduce the chances of relapse, try to:  Avoid drinking alcohol. Drinking lowers your chances of successfully quitting.  Reduce the amount of caffeine you consume. Once you quit smoking, the amount of caffeine in your body increases and can give you symptoms, such as a rapid heartbeat, sweating, and anxiety.  Avoid smokers because they can make you want to smoke.  Do not let weight gain distract you. Many smokers will gain weight when they quit, usually less than 10 pounds. Eat a healthy diet and stay active. You can always lose the weight gained after you quit.  Find ways to improve your mood other than smoking. FOR MORE INFORMATION  www.smokefree.gov  Document Released: 05/04/2001 Document Revised: 11/09/2011 Document Reviewed: 08/19/2011 [employer] Patient Information 2014 [employer].  

## 2013-06-07 NOTE — Progress Notes (Signed)
[place] [phone]   [phone]  OFFICE PROGRESS NOTE  Tivis Ringer, MD [place], [place]. [place] [address]  DIAGNOSIS: Stage IIIA (T2a, N2, MX) non-small cell lung cancer, squamous cell carcinoma diagnosed in September of 2013.   PRIOR THERAPY: Status post a course of concurrent chemoradiation with chemotherapy in the form of weekly carboplatin for AUC of 2 and paclitaxel at 45 mg/m2. The paclitaxel was discontinued after cycle #5 secondary to peripheral neuropathy. Last dose was given on 04/10/2012 with partial response.  CURRENT THERAPY: None  INTERVAL HISTORY: [name] 63 y.o. female returns to the clinic today for follow up visit accompanied by her husband and son. The patient continues to have shortness of breath with exertion and mild cough. She denied having any significant chest pain or hemoptysis. The patient denied having any significant weight loss or night sweats. She has no nausea or vomiting. She had repeat CT scan of the chest performed recently and she is here for evaluation and discussion of her scan results. Unfortunately the patient continues to smoke and I strongly encouraged her to quit smoking but she is not willing to do it at this time.  MEDICAL HISTORY: Past Medical History  Diagnosis Date  . Acute myocardial infarction, unspecified site, episode of care unspecified   . Esophageal dysmotility   . Hemorrhoids   . Esophageal stricture   . Diaphragmatic hernia without mention of obstruction or gangrene   . 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 CAD (coronary artery disease)     CABG'96., Xience DES -VG-RCA (01/12/13)  . Peripheral neuropathy   . Hyperlipidemia   . Peptic ulcer, unspecified site, unspecified as acute or chronic, without mention of hemorrhage, perforation, or obstruction   . Depressive disorder, not elsewhere classified   . Anxiety state,  unspecified   . COPD (chronic obstructive pulmonary disease)   . GERD (gastroesophageal reflux disease)   . Candida esophagitis     on multiple occasions  . C. difficile diarrhea   . PVD (peripheral vascular disease)   . Headache(784.0)   . Weight loss 06/13/2011    scheduled here by family doctor  . CHF (congestive heart failure) 2011  . Asthma   . Hypertension   . History of nuclear stress test 07/14/2010    dipyriadmole; normal pattern of perfusion, low risk   . History of tobacco use     quit 05/2009  . Non-small cell carcinoma of lung, stage 3     Dr. Roxan Hockey  . History of radiation therapy 02/28/2012-04/14/2012    right lung    ALLERGIES:  is allergic to amiodarone; clopidogrel bisulfate; dilaudid; and multaq.  MEDICATIONS:  Current Outpatient Prescriptions  Medication Sig Dispense Refill  . aspirin 81 MG EC tablet Take 81 mg by mouth daily.        Marland Kitchen atorvastatin (LIPITOR) 80 MG tablet Take 80 mg by mouth at bedtime.      . Cholecalciferol (VITAMIN D-3) 5000 UNITS TABS Take 1 tablet by mouth daily.      Marland Kitchen ezetimibe (ZETIA) 10 MG tablet Take 10 mg by mouth daily.        . famotidine (PEPCID) 20 MG tablet Take 20 mg by mouth at bedtime.      Marland Kitchen HYDROcodone-acetaminophen (NORCO) 10-325 MG per tablet Take 1 tablet by mouth every 8 (eight) hours.       . nitroGLYCERIN (NITROSTAT) 0.4 MG  SL tablet Place 0.4 mg under the tongue every 5 (five) minutes as needed. For chest pain      . nystatin (MYCOSTATIN) 100000 UNIT/ML suspension GARGLE AND SWALLOW 1 TEASPOONFUL 4 TIMES A DAY  240 mL  0  . omeprazole (PRILOSEC) 20 MG capsule Take 20 mg by mouth 2 (two) times daily.        . prasugrel (EFFIENT) 10 MG TABS tablet Take 1 tablet (10 mg total) by mouth daily.  30 tablet  11  . PROAIR HFA 108 (90 BASE) MCG/ACT inhaler INHALE 2 PUFFS INTO THE LUNGS EVERY 6 HOURS AS NEEDED FOR WHEEZING.  8.5 each  1  . SINGULAIR 10 MG tablet TAKE 1 TABLET EACH EVENING RO PREVENT COUGH OR WHEEZE  30  tablet  2  . SPIRIVA HANDIHALER 18 MCG inhalation capsule PLACE 1 CAPSULE INTO INHALER AND INHALE CONTENTS INTO THE LUNGS ONCE DAILY  30 capsule  5  . SYMBICORT 160-4.5 MCG/ACT inhaler INHALE 2 PUFFS INTO THE LUNGS 2 TIMES DAILY.  10.2 g  5   No current facility-administered medications for this visit.    SURGICAL HISTORY:  Past Surgical History  Procedure Laterality Date  . Tonsillectomy  1974  . Fracture lt jaw  1976  . Breast surgery  1969, 1970    bilateral  . Epigastric hernia repair  2008    Dr Roxan Hockey  . Coronary artery bypass graft  02/01/1994    left internal mammary to LAD, vein graft to diagonal/post descending, post lateral, marginals, circumfle, diagonal  . Carpal tunnel release    . Total abdominal hysterectomy  1998  . Laminectomy    . Subclavian vein angioplasty / stenting Left   . Transthoracic echocardiogram  08/14/2009    EF=>55%, normal LV systolic function, mild hypokinesis of basal segments of inferolateral & anterolater walls; mild MR/TR; AV mildly sclerotic  . Coronary angioplasty with stent placement  08/29/2000    stent to RCA (Dr. Corky Downs)  . Cardiac catheterization  01/17/2002    patent grafts (Dr. Marella Chimes)  . Cardiac catheterization  02/13/2004    significant L main & 3 vessel CAD, patents grafts, mild distal aortic disease (Dr. Gerrie Nordmann)  . Cardiac catheterization  07/14/2005    patent grafts, normal LV function (Dr. Adora Fridge)  . Cardiac catheterization  09/26/2008    patent grafts, normal LV function (Dr. Adora Fridge)  . Cardiac catheterization  05/31/2009    patent grafts, inferior wall motion abnormality (Dr. Adora Fridge)  . Cardiac catheterization  10/03/2009    normal LV function; 100% native RCA, circumflex, distal LAD occlusion  . Coronary angioplasty with stent placement  01/12/2013    Xience DES to SVG-RCA distal  . Carotid doppler  08/14/2009    R & L ICAs - 0-49% diameter reduction     REVIEW OF SYSTEMS:  A comprehensive review of systems  was negative except for: Respiratory: positive for cough and dyspnea on exertion   PHYSICAL EXAMINATION: General appearance: alert, cooperative and no distress Head: Normocephalic, without obvious abnormality, atraumatic Neck: no adenopathy, no JVD and thyroid not enlarged, symmetric, no tenderness/mass/nodules Lymph nodes: Cervical, supraclavicular, and axillary nodes normal. Resp: rhonchi bilaterally and wheezes bilaterally Cardio: regular rate and rhythm, S1, S2 normal, no murmur, click, rub or gallop GI: soft, non-tender; bowel sounds normal; no masses,  no organomegaly Extremities: extremities normal, atraumatic, no cyanosis or edema  ECOG PERFORMANCE STATUS: 1 - Symptomatic but completely ambulatory  Blood pressure 105/64, pulse  71, temperature 97.9 F (36.6 C), temperature source Oral, resp. rate 18, height 5\' 7"  (1.702 m), weight 98 lb 3.2 oz (44.543 kg), SpO2 100.00%.  LABORATORY DATA: Lab Results  Component Value Date   WBC 7.1 06/05/2013   HGB 13.7 06/05/2013   HCT 40.2 06/05/2013   MCV 97.9 06/05/2013   PLT 276 06/05/2013      Chemistry      Component Value Date/Time   NA 143 06/05/2013 0927   NA 142 01/13/2013 0630   K 4.2 06/05/2013 0927   K 3.6 01/13/2013 0630   CL 103 01/13/2013 0630   CL 104 11/07/2012 0839   CO2 26 06/05/2013 0927   CO2 27 01/13/2013 0630   BUN 12.0 06/05/2013 0927   BUN 8 01/13/2013 0630   CREATININE 0.8 06/05/2013 0927   CREATININE 0.81 01/13/2013 0630      Component Value Date/Time   CALCIUM 9.6 06/05/2013 0927   CALCIUM 9.8 01/13/2013 0630   ALKPHOS 108 06/05/2013 0927   ALKPHOS 106 02/07/2012 1227   AST 19 06/05/2013 0927   AST 18 02/07/2012 1227   ALT 16 06/05/2013 0927   ALT 13 02/07/2012 1227   BILITOT 0.52 06/05/2013 0927   BILITOT 0.5 02/07/2012 1227       RADIOGRAPHIC STUDIES: Ct Head W Wo Contrast  05/23/2013   CLINICAL DATA:  Headache for the past week with some improvement. Vertigo, blurred and double vision. History of lung cancer  2013 post chemotherapy and radiation therapy completed 1 year ago.  BUN and creatinine were obtained on site at [facility] at  [address]  Results:  BUN 9 mg/dL,  Creatinine 0.6 mg/dL.  EXAM: CT HEAD WITHOUT AND WITH CONTRAST  TECHNIQUE: Contiguous axial images were obtained from the base of the skull through the vertex without and with intravenous contrast  CONTRAST:  11mL OMNIPAQUE IOHEXOL 300 MG/ML  SOLN  COMPARISON:  02/08/2012 brain MR. 03/27/2006 head CT.  FINDINGS: No intracranial hemorrhage.  No CT evidence of large acute infarct.  No evidence of intracranial mass or bony destructive lesion to suggest the presence of intracranial metastatic disease. If this remains of high clinical concern, contrast-enhanced MR may be considered.  No hydrocephalus.  Partial opacification right mastoid air cells.  Prominent vascular calcifications.  IMPRESSION: No intracranial hemorrhage.  No CT evidence of large acute infarct.  No evidence of intracranial mass or bony destructive lesion to suggest the presence of intracranial metastatic disease. If this remains of high clinical concern, contrast-enhanced MR may be considered.  Partial opacification right mastoid air cells.  Prominent vascular calcifications.   Electronically Signed   By: Chauncey Cruel M.D.   On: 05/23/2013 09:59   Ct Chest W Contrast  06/05/2013   CLINICAL DATA:  Restaging lung cancer diagnosed in 2013. Radiation and chemotherapy completed.  EXAM: CT CHEST WITH CONTRAST  TECHNIQUE: Multidetector CT imaging of the chest was performed during intravenous contrast administration.  CONTRAST:  70mL OMNIPAQUE IOHEXOL 300 MG/ML  SOLN  COMPARISON:  CT CHEST W/CM dated 02/06/2013; DG CHEST 1V PORT dated 01/11/2013; CT CHEST W/CM dated 11/07/2012  FINDINGS: A paucity of mediastinal and subcutaneous fat is again noted. No enlarged mediastinal, hilar or axillary lymph nodes are identified.  There is stable mild nodularity of the thyroid gland. Diffuse  atherosclerosis of the aorta, great vessels and coronary arteries is noted status post median sternotomy. The ascending aorta is mildly dilated to 3.7 cm. There is a left subclavian arterial  stent.  The spiculated right upper lobe suprahilar density is similar to the most recent study, measuring approximately 11 x 7 mm on image 22. The subpleural density along the superior aspect of the major fissure more superiorly is minimally more prominent (image 18). The 4 mm right lower lobe nodule on image 34 is stable. However, there are several enlarging ill-defined ground-glass nodules in both lungs. These include a 4 mm right upper lobe nodule on image 25 and multiple small left upper lobe nodules seen on images 18-30. There is no confluent airspace opacity. Moderate emphysematous changes are noted.  The visualized upper abdomen has a stable appearance with extensive atherosclerosis. Again, the superior mesenteric artery appears occluded proximally. The thoracolumbar fusion appears grossly stable with extraosseous extension of the right T10 pedicle screw, mild surrounding lucency and a chronic superior endplate compression deformity at T10.  IMPRESSION: 1. The dominant spiculated right upper lobe nodule is similar to the most recent study, likely treated disease. 2. There are several ill-defined ground-glass nodules in both upper lobes which have enlarged. These are nonspecific and possibly inflammatory. Early metastases cannot be excluded. Continued close follow-up recommended. 3. No adenopathy or significant pleural effusion. 4. Extensive atherosclerosis.   Electronically Signed   By: Camie Patience M.D.   On: 06/05/2013 11:50    ASSESSMENT AND PLAN: this is a very pleasant 63 years old white female with history of stage IIIA non-small cell lung cancer status post concurrent chemoradiation with weekly carboplatin and paclitaxel. She has been observation since November of 2013 with no evidence for disease progression. I  discussed the scan results with the patient and her husband. I recommended for her to continue on observation with repeat CT scan of the chest in 4 months. She was advised to call immediately if she has any concerning symptoms in the interval.  For smoke cessation, I advised the patient to quit smoking and offered her smoke cessation program but she declined at this time. The patient voices understanding of current disease status and treatment options and is in agreement with the current care plan.  All questions were answered. The patient knows to call the clinic with any problems, questions or concerns. We can certainly see the patient much sooner if necessary.

## 2013-07-11 ENCOUNTER — Other Ambulatory Visit: Payer: Self-pay | Admitting: Pulmonary Disease

## 2013-07-11 NOTE — Telephone Encounter (Signed)
Spoke with pt. She is needing a refill on Diflucan due to thrush in her mouth and on her lips. Please advise on refill. Thanks.

## 2013-07-11 NOTE — Telephone Encounter (Signed)
Please advise pt to not take lipitor while she is using diflucan.

## 2013-07-20 ENCOUNTER — Encounter (HOSPITAL_COMMUNITY): Payer: Self-pay | Admitting: Emergency Medicine

## 2013-07-20 ENCOUNTER — Inpatient Hospital Stay (HOSPITAL_COMMUNITY)
Admission: EM | Admit: 2013-07-20 | Discharge: 2013-07-22 | DRG: 190 | Disposition: A | Discharge status: Home / Self Care | Payer: Medicaid Other | Attending: Internal Medicine | Admitting: Internal Medicine

## 2013-07-20 ENCOUNTER — Emergency Department (HOSPITAL_COMMUNITY): Payer: Medicaid Other

## 2013-07-20 DIAGNOSIS — F411 Generalized anxiety disorder: Secondary | ICD-10-CM

## 2013-07-20 DIAGNOSIS — F329 Major depressive disorder, single episode, unspecified: Secondary | ICD-10-CM | POA: Diagnosis present

## 2013-07-20 DIAGNOSIS — G609 Hereditary and idiopathic neuropathy, unspecified: Secondary | ICD-10-CM | POA: Diagnosis present

## 2013-07-20 DIAGNOSIS — J439 Emphysema, unspecified: Secondary | ICD-10-CM

## 2013-07-20 DIAGNOSIS — Z87891 Personal history of nicotine dependence: Secondary | ICD-10-CM

## 2013-07-20 DIAGNOSIS — I10 Essential (primary) hypertension: Secondary | ICD-10-CM | POA: Diagnosis present

## 2013-07-20 DIAGNOSIS — IMO0002 Reserved for concepts with insufficient information to code with codable children: Secondary | ICD-10-CM | POA: Diagnosis present

## 2013-07-20 DIAGNOSIS — Z951 Presence of aortocoronary bypass graft: Secondary | ICD-10-CM

## 2013-07-20 DIAGNOSIS — Z923 Personal history of irradiation: Secondary | ICD-10-CM

## 2013-07-20 DIAGNOSIS — Z681 Body mass index (BMI) 19 or less, adult: Secondary | ICD-10-CM

## 2013-07-20 DIAGNOSIS — J9690 Respiratory failure, unspecified, unspecified whether with hypoxia or hypercapnia: Secondary | ICD-10-CM | POA: Diagnosis present

## 2013-07-20 DIAGNOSIS — Z66 Do not resuscitate: Secondary | ICD-10-CM | POA: Diagnosis present

## 2013-07-20 DIAGNOSIS — Z7982 Long term (current) use of aspirin: Secondary | ICD-10-CM

## 2013-07-20 DIAGNOSIS — J449 Chronic obstructive pulmonary disease, unspecified: Secondary | ICD-10-CM | POA: Diagnosis present

## 2013-07-20 DIAGNOSIS — J9620 Acute and chronic respiratory failure, unspecified whether with hypoxia or hypercapnia: Secondary | ICD-10-CM | POA: Diagnosis present

## 2013-07-20 DIAGNOSIS — F3289 Other specified depressive episodes: Secondary | ICD-10-CM | POA: Diagnosis present

## 2013-07-20 DIAGNOSIS — Z9861 Coronary angioplasty status: Secondary | ICD-10-CM

## 2013-07-20 DIAGNOSIS — I255 Ischemic cardiomyopathy: Secondary | ICD-10-CM | POA: Diagnosis present

## 2013-07-20 DIAGNOSIS — E785 Hyperlipidemia, unspecified: Secondary | ICD-10-CM | POA: Diagnosis present

## 2013-07-20 DIAGNOSIS — I25810 Atherosclerosis of coronary artery bypass graft(s) without angina pectoris: Secondary | ICD-10-CM

## 2013-07-20 DIAGNOSIS — Z23 Encounter for immunization: Secondary | ICD-10-CM

## 2013-07-20 DIAGNOSIS — E43 Unspecified severe protein-calorie malnutrition: Secondary | ICD-10-CM | POA: Diagnosis present

## 2013-07-20 DIAGNOSIS — R079 Chest pain, unspecified: Secondary | ICD-10-CM

## 2013-07-20 DIAGNOSIS — J9611 Chronic respiratory failure with hypoxia: Secondary | ICD-10-CM

## 2013-07-20 DIAGNOSIS — I252 Old myocardial infarction: Secondary | ICD-10-CM

## 2013-07-20 DIAGNOSIS — J441 Chronic obstructive pulmonary disease with (acute) exacerbation: Principal | ICD-10-CM | POA: Diagnosis present

## 2013-07-20 DIAGNOSIS — K219 Gastro-esophageal reflux disease without esophagitis: Secondary | ICD-10-CM | POA: Diagnosis present

## 2013-07-20 DIAGNOSIS — Z9221 Personal history of antineoplastic chemotherapy: Secondary | ICD-10-CM

## 2013-07-20 DIAGNOSIS — C3490 Malignant neoplasm of unspecified part of unspecified bronchus or lung: Secondary | ICD-10-CM | POA: Diagnosis present

## 2013-07-20 DIAGNOSIS — I2589 Other forms of chronic ischemic heart disease: Secondary | ICD-10-CM | POA: Diagnosis present

## 2013-07-20 DIAGNOSIS — K2940 Chronic atrophic gastritis without bleeding: Secondary | ICD-10-CM

## 2013-07-20 DIAGNOSIS — I2510 Atherosclerotic heart disease of native coronary artery without angina pectoris: Secondary | ICD-10-CM

## 2013-07-20 DIAGNOSIS — J40 Bronchitis, not specified as acute or chronic: Secondary | ICD-10-CM

## 2013-07-20 DIAGNOSIS — I219 Acute myocardial infarction, unspecified: Secondary | ICD-10-CM | POA: Diagnosis present

## 2013-07-20 DIAGNOSIS — Z79899 Other long term (current) drug therapy: Secondary | ICD-10-CM

## 2013-07-20 DIAGNOSIS — I509 Heart failure, unspecified: Secondary | ICD-10-CM | POA: Diagnosis present

## 2013-07-20 LAB — I-STAT ARTERIAL BLOOD GAS, ED
Acid-base deficit: 1 mmol/L (ref 0.0–2.0)
Bicarbonate: 25 meq/L — ABNORMAL HIGH (ref 20.0–24.0)
O2 Saturation: 93 %
Patient temperature: 98.6
TCO2: 26 mmol/L (ref 0–100)
pCO2 arterial: 44.1 mmHg (ref 35.0–45.0)
pH, Arterial: 7.361 (ref 7.350–7.450)
pO2, Arterial: 72 mmHg — ABNORMAL LOW (ref 80.0–100.0)

## 2013-07-20 LAB — CBC
HCT: 39.9 % (ref 36.0–46.0)
HEMOGLOBIN: 13.6 g/dL (ref 12.0–15.0)
MCH: 32.7 pg (ref 26.0–34.0)
MCHC: 34.1 g/dL (ref 30.0–36.0)
MCV: 95.9 fL (ref 78.0–100.0)
PLATELETS: 219 10*3/uL (ref 150–400)
RBC: 4.16 MIL/uL (ref 3.87–5.11)
RDW: 13.2 % (ref 11.5–15.5)
WBC: 7.4 10*3/uL (ref 4.0–10.5)

## 2013-07-20 LAB — D-DIMER, QUANTITATIVE (NOT AT ARMC): D-Dimer, Quant: 0.36 ug/mL-FEU (ref 0.00–0.48)

## 2013-07-20 LAB — PROTIME-INR
INR: 1.1 (ref 0.00–1.49)
Prothrombin Time: 14 seconds (ref 11.6–15.2)

## 2013-07-20 LAB — BASIC METABOLIC PANEL
BUN: 11 mg/dL (ref 6–23)
CO2: 24 mEq/L (ref 19–32)
Calcium: 9.5 mg/dL (ref 8.4–10.5)
Chloride: 102 mEq/L (ref 96–112)
Creatinine, Ser: 0.56 mg/dL (ref 0.50–1.10)
GFR calc Af Amer: >90 mL/min (ref >90)
GFR calc non Af Amer: >90 mL/min (ref >90)
GLUCOSE: 100 mg/dL — AB (ref 70–99)
Potassium: 4.2 mEq/L (ref 3.7–5.3)
Sodium: 141 mEq/L (ref 137–147)

## 2013-07-20 LAB — PRO B NATRIURETIC PEPTIDE: Pro B Natriuretic peptide (BNP): 235.6 pg/mL — ABNORMAL HIGH (ref 0–125)

## 2013-07-20 LAB — TROPONIN I: Troponin I: <0.3 ng/mL (ref <0.30)

## 2013-07-20 LAB — I-STAT TROPONIN, ED: TROPONIN I, POC: 0 ng/mL (ref 0.00–0.08)

## 2013-07-20 LAB — MRSA PCR SCREENING: MRSA BY PCR: NEGATIVE

## 2013-07-20 MED ORDER — LORAZEPAM 2 MG/ML IJ SOLN
0.2500 mg | Freq: Once | INTRAMUSCULAR | Status: AC
  Administered 2013-07-20: 0.25 mg via INTRAVENOUS
  Filled 2013-07-20: qty 1

## 2013-07-20 MED ORDER — IPRATROPIUM-ALBUTEROL 0.5-2.5 (3) MG/3ML IN SOLN
3.0000 mL | Freq: Once | RESPIRATORY_TRACT | Status: AC
  Administered 2013-07-20: 3 mL via RESPIRATORY_TRACT
  Filled 2013-07-20: qty 3

## 2013-07-20 MED ORDER — ONDANSETRON HCL 4 MG/2ML IJ SOLN: 4.0000 mg | Freq: Four times a day (QID) | INTRAMUSCULAR | Status: DC | PRN

## 2013-07-20 MED ORDER — HEPARIN BOLUS VIA INFUSION
2500.0000 [IU] | Freq: Once | INTRAVENOUS | Status: AC
  Administered 2013-07-20: 2500 [IU] via INTRAVENOUS
  Filled 2013-07-20: qty 2500

## 2013-07-20 MED ORDER — MONTELUKAST SODIUM 10 MG PO TABS
10.0000 mg | ORAL_TABLET | Freq: Every day | ORAL | Status: DC
  Administered 2013-07-20 – 2013-07-21 (×2): 10 mg via ORAL
  Filled 2013-07-20 (×3): qty 1

## 2013-07-20 MED ORDER — SODIUM CHLORIDE 0.9 % IV SOLN
INTRAVENOUS | Status: DC
  Administered 2013-07-20: 12:00:00 via INTRAVENOUS

## 2013-07-20 MED ORDER — DIAZEPAM 5 MG PO TABS
5.0000 mg | ORAL_TABLET | Freq: Two times a day (BID) | ORAL | Status: DC | PRN
  Administered 2013-07-21: 5 mg via ORAL
  Filled 2013-07-20: qty 1

## 2013-07-20 MED ORDER — FAMOTIDINE 20 MG PO TABS
20.0000 mg | ORAL_TABLET | Freq: Every day | ORAL | Status: DC
  Administered 2013-07-20 – 2013-07-21 (×2): 20 mg via ORAL
  Filled 2013-07-20 (×3): qty 1

## 2013-07-20 MED ORDER — MORPHINE SULFATE 2 MG/ML IJ SOLN
2.0000 mg | Freq: Once | INTRAMUSCULAR | Status: AC
  Administered 2013-07-20: 2 mg via INTRAVENOUS
  Filled 2013-07-20: qty 1

## 2013-07-20 MED ORDER — HEPARIN (PORCINE) IN NACL 100-0.45 UNIT/ML-% IJ SOLN
700.0000 [IU]/h | INTRAMUSCULAR | Status: DC
  Administered 2013-07-20: 550 [IU]/h via INTRAVENOUS
  Filled 2013-07-20: qty 250

## 2013-07-20 MED ORDER — ONDANSETRON HCL 4 MG PO TABS: 4.0000 mg | ORAL_TABLET | Freq: Four times a day (QID) | ORAL | Status: DC | PRN

## 2013-07-20 MED ORDER — SODIUM CHLORIDE 0.9 % IV SOLN: 250.0000 mL | INTRAVENOUS | Status: DC | PRN

## 2013-07-20 MED ORDER — LORAZEPAM 2 MG/ML IJ SOLN: 0.5000 mg | Freq: Once | INTRAMUSCULAR | Status: DC

## 2013-07-20 MED ORDER — IPRATROPIUM-ALBUTEROL 0.5-2.5 (3) MG/3ML IN SOLN
3.0000 mL | RESPIRATORY_TRACT | Status: DC
  Administered 2013-07-21 – 2013-07-22 (×9): 3 mL via RESPIRATORY_TRACT
  Filled 2013-07-20 (×9): qty 3

## 2013-07-20 MED ORDER — SODIUM CHLORIDE 0.9 % IJ SOLN: 3.0000 mL | INTRAMUSCULAR | Status: DC | PRN

## 2013-07-20 MED ORDER — ONDANSETRON HCL 4 MG/2ML IJ SOLN
4.0000 mg | Freq: Once | INTRAMUSCULAR | Status: AC
  Administered 2013-07-20: 4 mg via INTRAVENOUS
  Filled 2013-07-20: qty 2

## 2013-07-20 MED ORDER — ATORVASTATIN CALCIUM 80 MG PO TABS
80.0000 mg | ORAL_TABLET | Freq: Every day | ORAL | Status: DC
  Administered 2013-07-20 – 2013-07-21 (×2): 80 mg via ORAL
  Filled 2013-07-20 (×3): qty 1

## 2013-07-20 MED ORDER — ASPIRIN 81 MG PO TBEC
81.0000 mg | DELAYED_RELEASE_TABLET | Freq: Every day | ORAL | Status: DC
  Administered 2013-07-21 – 2013-07-22 (×2): 81 mg via ORAL
  Filled 2013-07-20 (×2): qty 1

## 2013-07-20 MED ORDER — METHYLPREDNISOLONE SODIUM SUCC 40 MG IJ SOLR
40.0000 mg | Freq: Three times a day (TID) | INTRAMUSCULAR | Status: DC
  Administered 2013-07-20 – 2013-07-22 (×6): 40 mg via INTRAVENOUS
  Filled 2013-07-20 (×8): qty 1

## 2013-07-20 MED ORDER — HYDROCOD POLST-CHLORPHEN POLST 10-8 MG/5ML PO LQCR
5.0000 mL | Freq: Two times a day (BID) | ORAL | Status: DC
  Administered 2013-07-20 – 2013-07-22 (×4): 5 mL via ORAL
  Filled 2013-07-20 (×4): qty 5

## 2013-07-20 MED ORDER — NYSTATIN 100000 UNIT/ML MT SUSP
5.0000 mL | Freq: Four times a day (QID) | OROMUCOSAL | Status: DC
  Administered 2013-07-20 – 2013-07-22 (×7): 500000 [IU] via ORAL
  Filled 2013-07-20 (×10): qty 5

## 2013-07-20 MED ORDER — LEVALBUTEROL HCL 1.25 MG/0.5ML IN NEBU
1.2500 mg | INHALATION_SOLUTION | Freq: Once | RESPIRATORY_TRACT | Status: DC
  Filled 2013-07-20: qty 0.5

## 2013-07-20 MED ORDER — EZETIMIBE 10 MG PO TABS
10.0000 mg | ORAL_TABLET | Freq: Every day | ORAL | Status: DC
Start: 2013-07-21 — End: 2013-07-22
  Administered 2013-07-21 – 2013-07-22 (×2): 10 mg via ORAL
  Filled 2013-07-20 (×2): qty 1

## 2013-07-20 MED ORDER — GUAIFENESIN ER 600 MG PO TB12
600.0000 mg | ORAL_TABLET | Freq: Two times a day (BID) | ORAL | Status: DC
  Administered 2013-07-20 – 2013-07-22 (×4): 600 mg via ORAL
  Filled 2013-07-20 (×5): qty 1

## 2013-07-20 MED ORDER — NITROGLYCERIN 0.4 MG SL SUBL: 0.4000 mg | SUBLINGUAL_TABLET | SUBLINGUAL | Status: DC | PRN

## 2013-07-20 MED ORDER — BUDESONIDE-FORMOTEROL FUMARATE 160-4.5 MCG/ACT IN AERO
2.0000 | INHALATION_SPRAY | Freq: Two times a day (BID) | RESPIRATORY_TRACT | Status: DC
  Administered 2013-07-21 – 2013-07-22 (×4): 2 via RESPIRATORY_TRACT
  Filled 2013-07-20: qty 6

## 2013-07-20 MED ORDER — ALBUTEROL SULFATE (2.5 MG/3ML) 0.083% IN NEBU: 2.5000 mg | INHALATION_SOLUTION | RESPIRATORY_TRACT | Status: DC | PRN

## 2013-07-20 MED ORDER — PANTOPRAZOLE SODIUM 40 MG PO TBEC
40.0000 mg | DELAYED_RELEASE_TABLET | Freq: Every day | ORAL | Status: DC
  Administered 2013-07-20 – 2013-07-21 (×2): 40 mg via ORAL
  Filled 2013-07-20 (×2): qty 1

## 2013-07-20 MED ORDER — ASPIRIN 325 MG PO TABS
325.0000 mg | ORAL_TABLET | ORAL | Status: AC
  Administered 2013-07-20: 325 mg via ORAL
  Filled 2013-07-20: qty 1

## 2013-07-20 MED ORDER — HYDROCODONE-ACETAMINOPHEN 5-325 MG PO TABS
1.0000 | ORAL_TABLET | Freq: Three times a day (TID) | ORAL | Status: DC | PRN
  Administered 2013-07-20 – 2013-07-22 (×5): 1 via ORAL
  Filled 2013-07-20 (×5): qty 1

## 2013-07-20 MED ORDER — FENTANYL CITRATE 0.05 MG/ML IJ SOLN: 12.5000 ug | INTRAMUSCULAR | Status: DC | PRN

## 2013-07-20 MED ORDER — METHYLPREDNISOLONE SODIUM SUCC 125 MG IJ SOLR
80.0000 mg | Freq: Once | INTRAMUSCULAR | Status: AC
  Administered 2013-07-20: 80 mg via INTRAVENOUS
  Filled 2013-07-20: qty 2

## 2013-07-20 MED ORDER — LEVALBUTEROL HCL 1.25 MG/3ML IN NEBU
1.2500 mg | INHALATION_SOLUTION | Freq: Once | RESPIRATORY_TRACT | Status: AC
  Administered 2013-07-20: 1.25 mg via RESPIRATORY_TRACT
  Filled 2013-07-20: qty 3

## 2013-07-20 MED ORDER — LEVOFLOXACIN IN D5W 500 MG/100ML IV SOLN
500.0000 mg | INTRAVENOUS | Status: DC
  Administered 2013-07-20 – 2013-07-21 (×2): 500 mg via INTRAVENOUS
  Filled 2013-07-20 (×5): qty 100

## 2013-07-20 MED ORDER — SODIUM CHLORIDE 0.9 % IJ SOLN
3.0000 mL | Freq: Two times a day (BID) | INTRAMUSCULAR | Status: DC
  Administered 2013-07-20 – 2013-07-22 (×4): 3 mL via INTRAVENOUS

## 2013-07-20 NOTE — H&P (Signed)
[facility] History and Physical  [name] QJJ:[id number] DOB: [birth year] DOA: 07/20/2013  Referring physician: Dr Nonah Mattes.  PCP: Tivis Ringer, MD   Chief Complaint: Chest pain.   HPI: [name] is a 63 y.o. female Stage IIIA (T2a, N2, MX) non-small cell lung cancer, squamous cell carcinoma diagnosed in September of 2013.Status post a course of concurrent chemoradiation with chemotherapy. Last dose was given on 04/10/2012 with partial response, patient is on observation therapy, History of COPD, CAD, who presents to the ED complaining of chest pain, dyspnea that started 3 days prior to admission. She relates worsening productive cough also. She describes chest pain as pleuritic. Cardiology was consulted for chest pain.  Patient was notice to be in mild respiratory distress, ABG with Oxygen at 72. She is 98 % on 2 L. She received IV solumedrol, nebulizer treatment, started on heparin gtt, IV ativan. Patient feels some improvement of dyspnea during my evaluation.    Review of Systems:  Negative except as per HPI.   Past Medical History  Diagnosis Date  . Acute myocardial infarction, unspecified site, episode of care unspecified 2011    VF arrest. Ruled in, cath with no clear culprit lesion, inferior WMA on LV gram, medical therapy  . Esophageal dysmotility   . Hemorrhoids   . Esophageal stricture   . Diaphragmatic hernia without mention of obstruction or gangrene   . 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 CAD (coronary artery disease)     CABG'96., Xience DES -VG-RCA (01/12/13)  . Peripheral neuropathy   . Hyperlipidemia   . Peptic ulcer, unspecified site, unspecified as acute or chronic, without mention of hemorrhage, perforation, or obstruction   . Depressive disorder, not elsewhere classified   . Anxiety state, unspecified   . COPD (chronic obstructive pulmonary disease)   . GERD (gastroesophageal reflux disease)   . Candida esophagitis     on  multiple occasions  . C. difficile diarrhea   . PVD (peripheral vascular disease)   . Headache(784.0)   . Weight loss 06/13/2011    scheduled here by family doctor  . CHF (congestive heart failure) 2011  . Asthma   . Hypertension   . History of nuclear stress test 07/14/2010    dipyriadmole; normal pattern of perfusion, low risk   . History of tobacco use     quit 05/2009  . Non-small cell carcinoma of lung, stage 3     Dr. Roxan Hockey  . History of radiation therapy 02/28/2012-04/14/2012    right lung   Past Surgical History  Procedure Laterality Date  . Tonsillectomy  1974  . Fracture lt jaw  1976  . Breast surgery  1969, 1970    bilateral  . Epigastric hernia repair  2008    Dr Roxan Hockey  . Coronary artery bypass graft  02/01/1994    left internal mammary to LAD, vein graft to diagonal/post descending, post lateral, marginals, circumfle, diagonal  . Carpal tunnel release    . Total abdominal hysterectomy  1998  . Laminectomy    . Subclavian vein angioplasty / stenting Left   . Transthoracic echocardiogram  08/14/2009    EF=>55%, normal LV systolic function, mild hypokinesis of basal segments of inferolateral & anterolater walls; mild MR/TR; AV mildly sclerotic  . Coronary angioplasty with stent placement  08/29/2000    stent to RCA (Dr. Corky Downs)  . Cardiac catheterization  01/17/2002    patent grafts (Dr. Marella Chimes)  . Cardiac catheterization  02/13/2004  significant L main & 3 vessel CAD, patents grafts, mild distal aortic disease (Dr. Gerrie Nordmann)  . Cardiac catheterization  07/14/2005    patent grafts, normal LV function (Dr. Adora Fridge)  . Cardiac catheterization  09/26/2008    patent grafts, normal LV function (Dr. Adora Fridge)  . Cardiac catheterization  05/31/2009    patent grafts, inferior wall motion abnormality (Dr. Adora Fridge)  . Cardiac catheterization  10/03/2009    normal LV function; 100% native RCA, circumflex, distal LAD occlusion  . Coronary angioplasty with stent  placement  01/12/2013    Xience DES to SVG-RCA distal  . Carotid doppler  08/14/2009    R & L ICAs - 0-49% diameter reduction    Social History:  reports that she quit smoking about 9 years ago. Her smoking use included Cigarettes. She has a 45 pack-year smoking history. She has never used smokeless tobacco. She reports that she uses illicit drugs (Flunitrazepam). She reports that she does not drink alcohol.  Allergies  Allergen Reactions  . Amiodarone Other (See Comments)    Drops HR too low, SOB, fatigue  . Clopidogrel Bisulfate Hives  . Dilaudid [Hydromorphone Hcl] Other (See Comments)    "I couldn't function for 2 days."  . Multaq [Dronedarone] Other (See Comments)    Heart races     Family History  Problem Relation Age of Onset  . Lung cancer Father     also CVA  . Heart disease Father   . Asthma Father   . Allergies Father   . Cancer Father   . Heart disease Mother     also CVA, MI  . Brain cancer Mother   . Heart disease Sister   . Colon cancer Neg Hx   . Lung cancer Brother 79    also ENT cancer  . Hypertension Brother 67    also aneurysm, early CAD  . Breast cancer Other     family hx     Prior to Admission medications   Medication Sig Start Date End Date Taking? Authorizing Provider  albuterol (PROVENTIL HFA;VENTOLIN HFA) 108 (90 BASE) MCG/ACT inhaler Inhale 2 puffs into the lungs every 4 (four) hours as needed for wheezing or shortness of breath.   Yes Historical Provider, MD  aspirin 81 MG EC tablet Take 81 mg by mouth daily.     Yes Historical Provider, MD  atorvastatin (LIPITOR) 80 MG tablet Take 80 mg by mouth at bedtime.   Yes Historical Provider, MD  budesonide-formoterol (SYMBICORT) 160-4.5 MCG/ACT inhaler Inhale 2 puffs into the lungs 2 (two) times daily.   Yes Historical Provider, MD  Cholecalciferol (VITAMIN D-3) 5000 UNITS TABS Take 1 tablet by mouth daily.   Yes Historical Provider, MD  diazepam (VALIUM) 5 MG tablet Take 5 mg by mouth 2 (two) times  daily.   Yes Historical Provider, MD  ezetimibe (ZETIA) 10 MG tablet Take 10 mg by mouth daily.     Yes Historical Provider, MD  famotidine (PEPCID) 20 MG tablet Take 20 mg by mouth at bedtime.   Yes Historical Provider, MD  HYDROcodone-acetaminophen (NORCO/VICODIN) 5-325 MG per tablet Take 1 tablet by mouth every 8 (eight) hours as needed for moderate pain.   Yes Historical Provider, MD  montelukast (SINGULAIR) 10 MG tablet Take 10 mg by mouth at bedtime.   Yes Historical Provider, MD  nitroGLYCERIN (NITROSTAT) 0.4 MG SL tablet Place 0.4 mg under the tongue every 5 (five) minutes as needed. For chest pain   Yes  Historical Provider, MD  nystatin (MYCOSTATIN) 100000 UNIT/ML suspension Take 5 mLs by mouth 4 (four) times daily.   Yes Historical Provider, MD  omeprazole (PRILOSEC) 20 MG capsule Take 20 mg by mouth 2 (two) times daily.     Yes Historical Provider, MD  prasugrel (EFFIENT) 10 MG TABS tablet Take 1 tablet (10 mg total) by mouth daily. 01/13/13  Yes Brittainy Simmons, PA-C  tiotropium (SPIRIVA) 18 MCG inhalation capsule Place 18 mcg into inhaler and inhale daily.   Yes Historical Provider, MD   Physical Exam: Filed Vitals:   07/20/13 1915  BP: 108/71  Pulse: 110  Temp:   Resp: 33    BP 108/71  Pulse 110  Temp(Src) 99.4 F (37.4 C) (Oral)  Resp 33  SpO2 99%  General:  Appears calm and comfortable, mild tachypneic.  Eyes: PERRL, normal lids, irises & conjunctiva ENT: grossly normal hearing, lips & tongue Neck: no LAD, masses or thyromegaly Cardiovascular: RRR, no m/r/g. No LE edema. Respiratory:  Decrease breath sounds, sporadic wheezes, bilateral ronchus,  Abdomen: soft, ntnd Skin: no rash or induration seen on limited exam Musculoskeletal: grossly normal tone BUE/BLE Psychiatric: grossly normal mood and affect, speech fluent and appropriate Neurologic: grossly non-focal. Alert and oriented to place, time , person and situation.           Labs on Admission:  Basic  Metabolic Panel:  Recent Labs Lab 07/20/13 1030  NA 141  K 4.2  CL 102  CO2 24  GLUCOSE 100*  BUN 11  CREATININE 0.56  CALCIUM 9.5   Liver Function Tests: No results found for this basename: AST, ALT, ALKPHOS, BILITOT, PROT, ALBUMIN,  in the last 168 hours No results found for this basename: LIPASE, AMYLASE,  in the last 168 hours No results found for this basename: AMMONIA,  in the last 168 hours CBC:  Recent Labs Lab 07/20/13 1030  WBC 7.4  HGB 13.6  HCT 39.9  MCV 95.9  PLT 219   Cardiac Enzymes:  Recent Labs Lab 07/20/13 1220 07/20/13 1821  TROPONINI <0.30 <0.30    BNP (last 3 results)  Recent Labs  07/20/13 1030  PROBNP 235.6*   CBG: No results found for this basename: GLUCAP,  in the last 168 hours  Radiological Exams on Admission: Dg Chest Port 1 View  07/20/2013   CLINICAL DATA:  Chest pain and cough with history of CHF and previous smoking.  EXAM: PORTABLE CHEST - 1 VIEW  COMPARISON:  CT CHEST W/CM dated 06/05/2013; DG CHEST 2 VIEW dated 09/03/2011  FINDINGS: The lungs remain hyperinflated with hemidiaphragm flattening. There is no focal infiltrate. The cardiac silhouette is normal in size. The pulmonary vascularity is not engorged. Areas of nodularity projecting in the mid to lower hemithoraces likely reflect nipple shadows. There is no pleural effusion or pneumothorax. Seven sternal wires are present. The uppermost wire is broken. The bony thorax exhibits no acute abnormality. Previous lower thoracic and upper lumbar fusion has occurred.  IMPRESSION: 1. There is hyperinflation consistent with COPD. There is no evidence of pneumonia nor CHF. 2. There is no pneumothorax or pleural effusion. 3. Areas of nodularity in both lungs likely reflect nipple shadows. When the patient can tolerate the procedure, a PA and lateral chest x-ray would be of value.   Electronically Signed   By: David  Martinique   On: 07/20/2013 11:43    EKG: Independently reviewed. Sinus  tachycardia.   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 EF ~45%   Chest pain   Respiratory failure  1)Acute on chronic Respiratory failure: probably secondary to COPD exacerbation.  Patient presents with Dyspnea, worsening cough, chest pain.  Admit to step down unit.  IV solumedrol 40 Mg IV every 8 hours.  IV Levaquin.  BIPAP PRN.  BNP very mildly elevated at 235.   2-Chest Pain: pleuritic. Troponin times 2. Continue with heparin Gtt. Will check D dimer if positive will order Ct angio. Cardiology following.   3-COPD acute exacerbation: see problem number 1.  4-Stage IIIA (T2a, N2, MX) non-small cell lung cancer, squamous cell carcinoma diagnosed in September of 2013. On observation therapy.   Code Status: Patient wishes to be DNR.  Family Communication: care discussed with patient.  Disposition Plan: expect 3 to 4 days inpatient.   Time spent: 75 minutes.   [facility] Pager [phone]

## 2013-07-20 NOTE — ED Notes (Addendum)
PT states she started having jaw pain with general aches and pains and a productive cough with yellow sputum. PT states she felt like she had a fever at home, but was unsure. Temp via EMS 97.8 Oral. PT began feeling jaw pain and was "gasping for breath" today which is why she called EMS. PT has extensive cardiac hx with 8 stents, 9 bypass-grafts, MI 5 months ago, and persistent a-fib. PT also has hx of lung cancer w/ node involvement that is currently in remission

## 2013-07-20 NOTE — Consult Note (Signed)
[facility] Consult NOTE   Patient ID: [name] MRN: [id number], DOB/AGE: 61-Jun-1952 61 y.o. Date of Encounter: 07/20/2013  Primary Physician: Tivis Ringer, MD Primary Cardiologist: Gwenlyn Found  Chief Complaint:  Chest pain  HPI: [name] is a 63 y.o. female with a long history of CAD. She has been coughing a great deal over the last few days, thinks she has had fevers. Feels like she needs prednisone and maybe antibiotics.    Today, she woke with chest pain. It is under her left breast, in her jaw and her teeth. The pain is worse with deep inspiration. It reminds her of her cardiac pain prior to stenting, but the respiratory component is not usually present. She took SL NTG x 2 without relief. She came to the ER, where she has rec'd ASA 325 mg, Duoneb x 1, morphine totaling 4 mg, Zofran 4 mg and IVF NS at 50 cc/hr. She currently is wheezing a great deal and coughing frequently. She has had white sputum, no purulence.   She has had respiratory problems like this before, in association with COPD or URI, and feels like she would improve with steroids and more nebs. She is compliant with her multiple inhalers and has been using the PRN albuterol every 4 hours for the last 2-3 days.   Past Medical History  Diagnosis Date  . Acute myocardial infarction, unspecified site, episode of care unspecified 2011    VF arrest. Ruled in, cath with no clear culprit lesion, inferior WMA on LV gram, medical therapy  . Esophageal dysmotility   . Hemorrhoids   . Esophageal stricture   . Diaphragmatic hernia without mention of obstruction or gangrene   . 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 CAD (coronary artery disease)     CABG'96., Xience DES -VG-RCA (01/12/13)  . Peripheral neuropathy   . Hyperlipidemia   . Peptic ulcer, unspecified site, unspecified as acute or chronic, without mention of hemorrhage, perforation, or obstruction   . Depressive disorder, not  elsewhere classified   . Anxiety state, unspecified   . COPD (chronic obstructive pulmonary disease)   . GERD (gastroesophageal reflux disease)   . Candida esophagitis     on multiple occasions  . C. difficile diarrhea   . PVD (peripheral vascular disease)   . Headache(784.0)   . Weight loss 06/13/2011    scheduled here by family doctor  . CHF (congestive heart failure) 2011  . Asthma   . Hypertension   . History of nuclear stress test 07/14/2010    dipyriadmole; normal pattern of perfusion, low risk   . History of tobacco use     quit 05/2009  . Non-small cell carcinoma of lung, stage 3     Dr. Roxan Hockey  . History of radiation therapy 02/28/2012-04/14/2012    right lung    Surgical History:  Past Surgical History  Procedure Laterality Date  . Tonsillectomy  1974  . Fracture lt jaw  1976  . Breast surgery  1969, 1970    bilateral  . Epigastric hernia repair  2008    Dr Roxan Hockey  . Coronary artery bypass graft  02/01/1994    left internal mammary to LAD, vein graft to diagonal/post descending, post lateral, marginals, circumfle, diagonal  . Carpal tunnel release    . Total abdominal hysterectomy  1998  . Laminectomy    . Subclavian vein angioplasty / stenting Left   . Transthoracic echocardiogram  08/14/2009  EF=>55%, normal LV systolic function, mild hypokinesis of basal segments of inferolateral & anterolater walls; mild MR/TR; AV mildly sclerotic  . Coronary angioplasty with stent placement  08/29/2000    stent to RCA (Dr. Corky Downs)  . Cardiac catheterization  01/17/2002    patent grafts (Dr. Marella Chimes)  . Cardiac catheterization  02/13/2004    significant L main & 3 vessel CAD, patents grafts, mild distal aortic disease (Dr. Gerrie Nordmann)  . Cardiac catheterization  07/14/2005    patent grafts, normal LV function (Dr. Adora Fridge)  . Cardiac catheterization  09/26/2008    patent grafts, normal LV function (Dr. Adora Fridge)  . Cardiac catheterization  05/31/2009    patent  grafts, inferior wall motion abnormality (Dr. Adora Fridge)  . Cardiac catheterization  10/03/2009    normal LV function; 100% native RCA, circumflex, distal LAD occlusion  . Coronary angioplasty with stent placement  01/12/2013    Xience DES to SVG-RCA distal  . Carotid doppler  08/14/2009    R & L ICAs - 0-49% diameter reduction      I have reviewed the patient's current medications. Prior to Admission medications   Medication Sig Start Date End Date Taking? Authorizing Provider  albuterol (PROVENTIL HFA;VENTOLIN HFA) 108 (90 BASE) MCG/ACT inhaler Inhale 2 puffs into the lungs every 4 (four) hours as needed for wheezing or shortness of breath.   Yes Historical Provider, MD  aspirin 81 MG EC tablet Take 81 mg by mouth daily.     Yes Historical Provider, MD  atorvastatin (LIPITOR) 80 MG tablet Take 80 mg by mouth at bedtime.   Yes Historical Provider, MD  budesonide-formoterol (SYMBICORT) 160-4.5 MCG/ACT inhaler Inhale 2 puffs into the lungs 2 (two) times daily.   Yes Historical Provider, MD  Cholecalciferol (VITAMIN D-3) 5000 UNITS TABS Take 1 tablet by mouth daily.   Yes Historical Provider, MD  diazepam (VALIUM) 5 MG tablet Take 5 mg by mouth 2 (two) times daily.   Yes Historical Provider, MD  ezetimibe (ZETIA) 10 MG tablet Take 10 mg by mouth daily.     Yes Historical Provider, MD  famotidine (PEPCID) 20 MG tablet Take 20 mg by mouth at bedtime.   Yes Historical Provider, MD  HYDROcodone-acetaminophen (NORCO/VICODIN) 5-325 MG per tablet Take 1 tablet by mouth every 8 (eight) hours as needed for moderate pain.   Yes Historical Provider, MD  montelukast (SINGULAIR) 10 MG tablet Take 10 mg by mouth at bedtime.   Yes Historical Provider, MD  nitroGLYCERIN (NITROSTAT) 0.4 MG SL tablet Place 0.4 mg under the tongue every 5 (five) minutes as needed. For chest pain   Yes Historical Provider, MD  nystatin (MYCOSTATIN) 100000 UNIT/ML suspension Take 5 mLs by mouth 4 (four) times daily.   Yes Historical  Provider, MD  omeprazole (PRILOSEC) 20 MG capsule Take 20 mg by mouth 2 (two) times daily.     Yes Historical Provider, MD  prasugrel (EFFIENT) 10 MG TABS tablet Take 1 tablet (10 mg total) by mouth daily. 01/13/13  Yes Brittainy Simmons, PA-C  tiotropium (SPIRIVA) 18 MCG inhalation capsule Place 18 mcg into inhaler and inhale daily.   Yes Historical Provider, MD   Scheduled Meds: . chlorpheniramine-HYDROcodone  5 mL Oral Q12H   Continuous Infusions: . sodium chloride 50 mL/hr at 07/20/13 1134   PRN Meds:.  Allergies:  Allergies  Allergen Reactions  . Amiodarone Other (See Comments)    Drops HR too low, SOB, fatigue  . Clopidogrel Bisulfate Hives  .  Dilaudid [Hydromorphone Hcl] Other (See Comments)    "I couldn't function for 2 days."  . Multaq [Dronedarone] Other (See Comments)    Heart races     History   Social History  . Marital Status: Married    Spouse Name: N/A    Number of Children: 2  . Years of Education: N/A   Occupational History  . disabled     back problems   Social History Main Topics  . Smoking status: Former Smoker -- 1.50 packs/day for 30 years    Types: Cigarettes    Quit date: 05/24/2004  . Smokeless tobacco: Never Used  . Alcohol Use: No  . Drug Use: Yes    Special: Flunitrazepam  . Sexual Activity: Not on file   Other Topics Concern  . Not on file   Social History Narrative   Married with 2 children, husband has Alzheimer's dementia, retired now, previously Corporate treasurer, Quit smoking in [place] History  Problem Relation Age of Onset  . Lung cancer Father     also CVA  . Heart disease Father   . Asthma Father   . Allergies Father   . Cancer Father   . Heart disease Mother     also CVA, MI  . Brain cancer Mother   . Heart disease Sister   . Colon cancer Neg Hx   . Lung cancer Brother 46    also ENT cancer  . Hypertension Brother 89    also aneurysm, early CAD  . Breast cancer Other     family hx   Family  Status  Relation Status Death Age  . Father Deceased   . Mother Deceased   . Brother Deceased     Review of Systems:   Full 14-point review of systems otherwise negative except as noted above.  Physical Exam: Blood pressure 99/44, pulse 63, temperature 99.4 F (37.4 C), temperature source Oral, resp. rate 20, SpO2 98.00%. General: Well developed, cachectic female in marked respiratory distress. Head: Normocephalic, atraumatic, sclera non-icteric, no xanthomas, nares are without discharge. Dentition: poor Neck: left carotid bruit. JVD elevated. No thyromegally Lungs: decreased breath sounds . With severe wheezing Heart: Slightly irregular rate and rhythm with S1 S2.  No S3 or S4.  No murmur, no rubs, or gallops appreciated. Abdomen: Soft, non-tender, non-distended with normoactive bowel sounds. No hepatomegaly. No rebound/guarding. No obvious abdominal masses. Msk:  Strength and tone appear normal for age. No joint deformities or effusions, no spine or costo-vertebral angle tenderness. Extremities: No clubbing or cyanosis. No edema.  Distal pedal pulses are 2+ in 4 extrem Neuro: Alert and oriented X 3. Moves all extremities spontaneously. No focal deficits noted. Psych:  Responds to questions appropriately with a normal affect. Skin: No rashes or lesions noted  Labs:   Lab Results  Component Value Date   WBC 7.4 07/20/2013   HGB 13.6 07/20/2013   HCT 39.9 07/20/2013   MCV 95.9 07/20/2013   PLT 219 07/20/2013    Recent Labs  07/20/13 1030  INR 1.10     Recent Labs Lab 07/20/13 1030  NA 141  K 4.2  CL 102  CO2 24  BUN 11  CREATININE 0.56  CALCIUM 9.5  GLUCOSE 100*    Recent Labs  07/20/13 1220  TROPONINI <0.30    Recent Labs  07/20/13 1104  TROPIPOC 0.00    Pro B Natriuretic peptide (BNP)  Date/Time Value Ref Range Status  07/20/2013  10:30 AM 235.6* 0 - 125 pg/mL Final  07/25/2009  7:10 PM 44.0  0.0 - 100.0 pg/mL Final   Lab Results  Component Value Date    DDIMER  Value: <0.22        AT THE INHOUSE ESTABLISHED CUTOFF VALUE OF 0.48 ug/mL FEU, THIS ASSAY HAS BEEN DOCUMENTED IN THE LITERATURE TO HAVE A SENSITIVITY AND NEGATIVE PREDICTIVE VALUE OF AT LEAST 98 TO 99%.  THE TEST RESULT SHOULD BE CORRELATED WITH AN ASSESSMENT OF THE CLINICAL PROBABILITY OF DVT / VTE. 10/02/2009     Radiology/Studies: Dg Chest Port 1 View 07/20/2013   CLINICAL DATA:  Chest pain and cough with history of CHF and previous smoking.  EXAM: PORTABLE CHEST - 1 VIEW  COMPARISON:  CT CHEST W/CM dated 06/05/2013; DG CHEST 2 VIEW dated 09/03/2011  FINDINGS: The lungs remain hyperinflated with hemidiaphragm flattening. There is no focal infiltrate. The cardiac silhouette is normal in size. The pulmonary vascularity is not engorged. Areas of nodularity projecting in the mid to lower hemithoraces likely reflect nipple shadows. There is no pleural effusion or pneumothorax. Seven sternal wires are present. The uppermost wire is broken. The bony thorax exhibits no acute abnormality. Previous lower thoracic and upper lumbar fusion has occurred.  IMPRESSION: 1. There is hyperinflation consistent with COPD. There is no evidence of pneumonia nor CHF. 2. There is no pneumothorax or pleural effusion. 3. Areas of nodularity in both lungs likely reflect nipple shadows. When the patient can tolerate the procedure, a PA and lateral chest x-ray would be of value.   Electronically Signed   By: David  Martinique   On: 07/20/2013 11:43     Cardiac Cath: 01/11/2013 Coronary Anatomy:  Left Main: Moderate caliber vessel with occluded Circumflex, that perfuses the proximal portion of the LAD in a small diagonal branch.  LAD: Is a 99% proximal stenosis followed by total occlusion in the mid vessel with distal competitive flow.  LIMA-LAD: Widely patent, moderate caliber vessel that reaches the mid LAD, downstream LAD is free of significant disease. The graft also perfuses a diagonal, there was question of this being a  sequential graft., Diagnosis small-caliber vessel but free of significant disease. Left Circumflex: 100% ostial occlusion  SVG-D1-OM1-OM 2: Widely patent graft to a very small D1, OM1 and OM 2. These vessels are very small and diameter, D1 has a proximal/ostial 60 % stenosis but there was TIMI-3 flow throughout all grafted vessels. RCA: Large-caliber vessel with 90% proximal stenosis and 100% occluded in the genu.  SVG-distal RCA (sequential PDA-PL): Large-caliber graft, 2 very large distribution PDA and PL system. There is a distal 95%, irregular stenosis in his graft. Is relatively close to the anastomosis point  Stent: Xience Xpedition 3.0 mm x 18 mm POST-OPERATIVE DIAGNOSIS:  Severe native coronary disease as previous documented.  Culprit lesion is a 95% stenosis in the distal SVG-RCA.  Status post successful PCI with a Xience Xpedition DES stent  Patent LIMA-LAD and SVG-D1-OM1-OM 2.  Echo: 08/14/2009 EF normal, minor WMA, RV normal  ECG: 07/20/2013 SR Vent. rate 113 BPM PR interval 129 ms QRS duration 78 ms QT/QTc 323/443 ms P-R-T axes 83 -66 54  ASSESSMENT AND PLAN:  Acute on chronic respiratory failure - IM +/- CCM to see, may need BiPAP or ETT.  Chest pain - recheck troponin pending.  SignedRosaria Ferries, PA-C 07/20/2013 6:02 PM Beeper [phone]  Pt with 4 dys of cough productive of sputuum and one day of chest pain.  Troponin  initial is neg and repeat has just been drawn  She is extremely dyspneic with impending resp failure.  Her ECG >> sinus tach with diffuse mild ST depression, prominent P waves in inferior lead (p Pulmonale). She has both CAD with remote CABG and PCI 8/14 for neck pain with neg Ez; so ischemia is possible although I suspect secondary. Her  Recheck troponin will be very helpful in clarifying.  Severe COPD seems the more likely problem esp with ECG changes but I also worry about the possibility of PE with ECG and respiratory insufficiency  We will  follow with you,  I think heparin is reasonable as we try and sort this out.  She has arachnodactyly  And back pain but the latter is chronic and not changed so I think dissection is unlikely

## 2013-07-20 NOTE — ED Notes (Signed)
Patient presents to ed c/o swelling to right lower leg  Onset Sunday denies injury. States swelling went down however now she has a knot on right lower shin increased pain with weigh bearing. Positive right pedal pulse

## 2013-07-20 NOTE — Progress Notes (Signed)
ANTICOAGULATION CONSULT NOTE - Initial Consult  Pharmacy Consult for Heparin Indication: CP/ACS  Allergies  Allergen Reactions  . Amiodarone Other (See Comments)    Drops HR too low, SOB, fatigue  . Clopidogrel Bisulfate Hives  . Dilaudid [Hydromorphone Hcl] Other (See Comments)    "I couldn't function for 2 days."  . Multaq [Dronedarone] Other (See Comments)    Heart races     Patient Measurements:   Heparin Dosing Weight:  44.5 kg  Vital Signs: Temp: 99.4 F (37.4 C) (02/27 1046) Temp src: Oral (02/27 1046) BP: 139/68 mmHg (02/27 1830) Pulse Rate: 124 (02/27 1830)  Labs:  Recent Labs  07/20/13 1030 07/20/13 1220  HGB 13.6  --   HCT 39.9  --   PLT 219  --   LABPROT 14.0  --   INR 1.10  --   CREATININE 0.56  --   TROPONINI  --  <0.30    The CrCl is unknown because both a height and weight (above a minimum accepted value) are required for this calculation.   Medical History: Past Medical History  Diagnosis Date  . Acute myocardial infarction, unspecified site, episode of care unspecified 2011    VF arrest. Ruled in, cath with no clear culprit lesion, inferior WMA on LV gram, medical therapy  . Esophageal dysmotility   . Hemorrhoids   . Esophageal stricture   . Diaphragmatic hernia without mention of obstruction or gangrene   . 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 CAD (coronary artery disease)     CABG'96., Xience DES -VG-RCA (01/12/13)  . Peripheral neuropathy   . Hyperlipidemia   . Peptic ulcer, unspecified site, unspecified as acute or chronic, without mention of hemorrhage, perforation, or obstruction   . Depressive disorder, not elsewhere classified   . Anxiety state, unspecified   . COPD (chronic obstructive pulmonary disease)   . GERD (gastroesophageal reflux disease)   . Candida esophagitis     on multiple occasions  . C. difficile diarrhea   . PVD (peripheral vascular disease)   . Headache(784.0)   . Weight loss 06/13/2011   scheduled here by family doctor  . CHF (congestive heart failure) 2011  . Asthma   . Hypertension   . History of nuclear stress test 07/14/2010    dipyriadmole; normal pattern of perfusion, low risk   . History of tobacco use     quit 05/2009  . Non-small cell carcinoma of lung, stage 3     Dr. Roxan Hockey  . History of radiation therapy 02/28/2012-04/14/2012    right lung    Medications:  See med history  Assessment: SOB, jaw pain 63 y/o F with complex PMH and known CAD presents with c/o jaw pain, general aches/pains, productive cough, subjective fever, RLE swelling. In ED she is extremely dyspneic with impending resp failure per MD. Start empiric heparin until troponins result. Likely more respiratory component.  Labs: Troponin i >0.3 x 1, Scr 0.56, Hgb 13.6, Plts 219  Goal of Therapy:  Heparin level 0.3-0.7 units/ml Monitor platelets by anticoagulation protocol: Yes   Plan:  Heparin 2500mg  bolus (60units/kg) Heparin infusion 550 units/hr Check heparin level in 8 hrs and daily.  Marcene Laskowski S. Alford Highland, PharmD, BCPS Clinical Staff Pharmacist Pager [phone]  Eilene Ghazi Stillinger 07/20/2013,6:48 PM

## 2013-07-20 NOTE — ED Provider Notes (Signed)
CSN: [id number]     Arrival date & time 07/20/13  1023 History   First MD Initiated Contact with Patient 07/20/13 1047     Chief Complaint  Patient presents with  . Shortness of Breath  . Jaw Pain     (Consider location/radiation/quality/duration/timing/severity/associated sxs/prior Treatment) Patient is a 63 y.o. female presenting with shortness of breath. The history is provided by the patient.  Shortness of Breath Associated symptoms: chest pain and cough   Associated symptoms: no abdominal pain, no fever, no headaches and no vomiting    patient extensive cardiac history. Onset of chest pain this morning at 6:30 was left-sided. Radiate into her jaws into her left arm. Dr. Alvester Chou is her cardiologist. Patient also with COPD he said a cough for 4 days but no chest pain associated with that. Today's chest pain is made a little bit worse with coughing but is present even without coughing. Associated with some nausea. Pain is 10 out of 10. Patient's had multiple stents.  Past Medical History  Diagnosis Date  . Acute myocardial infarction, unspecified site, episode of care unspecified   . Esophageal dysmotility   . Hemorrhoids   . Esophageal stricture   . Diaphragmatic hernia without mention of obstruction or gangrene   . 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 CAD (coronary artery disease)     CABG'96., Xience DES -VG-RCA (01/12/13)  . Peripheral neuropathy   . Hyperlipidemia   . Peptic ulcer, unspecified site, unspecified as acute or chronic, without mention of hemorrhage, perforation, or obstruction   . Depressive disorder, not elsewhere classified   . Anxiety state, unspecified   . COPD (chronic obstructive pulmonary disease)   . GERD (gastroesophageal reflux disease)   . Candida esophagitis     on multiple occasions  . C. difficile diarrhea   . PVD (peripheral vascular disease)   . Headache(784.0)   . Weight loss 06/13/2011    scheduled here by family doctor  . CHF  (congestive heart failure) 2011  . Asthma   . Hypertension   . History of nuclear stress test 07/14/2010    dipyriadmole; normal pattern of perfusion, low risk   . History of tobacco use     quit 05/2009  . Non-small cell carcinoma of lung, stage 3     Dr. Roxan Hockey  . History of radiation therapy 02/28/2012-04/14/2012    right lung   Past Surgical History  Procedure Laterality Date  . Tonsillectomy  1974  . Fracture lt jaw  1976  . Breast surgery  1969, 1970    bilateral  . Epigastric hernia repair  2008    Dr Roxan Hockey  . Coronary artery bypass graft  02/01/1994    left internal mammary to LAD, vein graft to diagonal/post descending, post lateral, marginals, circumfle, diagonal  . Carpal tunnel release    . Total abdominal hysterectomy  1998  . Laminectomy    . Subclavian vein angioplasty / stenting Left   . Transthoracic echocardiogram  08/14/2009    EF=>55%, normal LV systolic function, mild hypokinesis of basal segments of inferolateral & anterolater walls; mild MR/TR; AV mildly sclerotic  . Coronary angioplasty with stent placement  08/29/2000    stent to RCA (Dr. Corky Downs)  . Cardiac catheterization  01/17/2002    patent grafts (Dr. Marella Chimes)  . Cardiac catheterization  02/13/2004    significant L main & 3 vessel CAD, patents grafts, mild distal aortic disease (Dr. Gerrie Nordmann)  . Cardiac catheterization  07/14/2005    patent grafts, normal LV function (Dr. Adora Fridge)  . Cardiac catheterization  09/26/2008    patent grafts, normal LV function (Dr. Adora Fridge)  . Cardiac catheterization  05/31/2009    patent grafts, inferior wall motion abnormality (Dr. Adora Fridge)  . Cardiac catheterization  10/03/2009    normal LV function; 100% native RCA, circumflex, distal LAD occlusion  . Coronary angioplasty with stent placement  01/12/2013    Xience DES to SVG-RCA distal  . Carotid doppler  08/14/2009    R & L ICAs - 0-49% diameter reduction    Family History  Problem Relation Age of  Onset  . Lung cancer Father     also CVA  . Heart disease Father   . Asthma Father   . Allergies Father   . Cancer Father   . Heart disease Mother     also CVA, MI  . Brain cancer Mother   . Heart disease Sister   . Colon cancer Neg Hx   . Lung cancer Brother 32    also ENT cancer  . Hypertension Brother 79    also aneurysm, early CAD  . Breast cancer Other     family hx   History  Substance Use Topics  . Smoking status: Former Smoker -- 1.50 packs/day for 30 years    Types: Cigarettes    Quit date: 05/24/2004  . Smokeless tobacco: Never Used  . Alcohol Use: No   OB History   Grav Para Term Preterm Abortions TAB SAB Ect Mult Living                 Review of Systems  Constitutional: Negative for fever.  HENT: Positive for congestion.   Eyes: Negative for visual disturbance.  Respiratory: Positive for cough and shortness of breath.   Cardiovascular: Positive for chest pain.  Gastrointestinal: Negative for nausea, vomiting, abdominal pain and diarrhea.  Genitourinary: Negative for dysuria.  Musculoskeletal: Negative for back pain.  Neurological: Negative for headaches.  Hematological: Does not bruise/bleed easily.  Psychiatric/Behavioral: Negative for confusion.      Allergies  Amiodarone; Clopidogrel bisulfate; Dilaudid; and Multaq  Home Medications   Current Outpatient Rx  Name  Route  Sig  Dispense  Refill  . albuterol (PROVENTIL HFA;VENTOLIN HFA) 108 (90 BASE) MCG/ACT inhaler   Inhalation   Inhale 2 puffs into the lungs every 4 (four) hours as needed for wheezing or shortness of breath.         Marland Kitchen aspirin 81 MG EC tablet   Oral   Take 81 mg by mouth daily.           Marland Kitchen atorvastatin (LIPITOR) 80 MG tablet   Oral   Take 80 mg by mouth at bedtime.         . budesonide-formoterol (SYMBICORT) 160-4.5 MCG/ACT inhaler   Inhalation   Inhale 2 puffs into the lungs 2 (two) times daily.         . Cholecalciferol (VITAMIN D-3) 5000 UNITS TABS    Oral   Take 1 tablet by mouth daily.         . diazepam (VALIUM) 5 MG tablet   Oral   Take 5 mg by mouth 2 (two) times daily.         Marland Kitchen ezetimibe (ZETIA) 10 MG tablet   Oral   Take 10 mg by mouth daily.           . famotidine (PEPCID) 20 MG tablet  Oral   Take 20 mg by mouth at bedtime.         Marland Kitchen HYDROcodone-acetaminophen (NORCO/VICODIN) 5-325 MG per tablet   Oral   Take 1 tablet by mouth every 8 (eight) hours as needed for moderate pain.         . montelukast (SINGULAIR) 10 MG tablet   Oral   Take 10 mg by mouth at bedtime.         . nitroGLYCERIN (NITROSTAT) 0.4 MG SL tablet   Sublingual   Place 0.4 mg under the tongue every 5 (five) minutes as needed. For chest pain         . nystatin (MYCOSTATIN) 100000 UNIT/ML suspension   Oral   Take 5 mLs by mouth 4 (four) times daily.         Marland Kitchen omeprazole (PRILOSEC) 20 MG capsule   Oral   Take 20 mg by mouth 2 (two) times daily.           . prasugrel (EFFIENT) 10 MG TABS tablet   Oral   Take 1 tablet (10 mg total) by mouth daily.   30 tablet   11   . tiotropium (SPIRIVA) 18 MCG inhalation capsule   Inhalation   Place 18 mcg into inhaler and inhale daily.          BP 132/70  Pulse 100  Temp(Src) 99.4 F (37.4 C) (Oral)  Resp 34  SpO2 97% Physical Exam  Nursing note and vitals reviewed. Constitutional: She appears well-developed and well-nourished. No distress.  HENT:  Head: Normocephalic and atraumatic.  Mouth/Throat: Oropharynx is clear and moist.  Eyes: Conjunctivae and EOM are normal. Pupils are equal, round, and reactive to light.  Neck: Normal range of motion.  Pulmonary/Chest: Effort normal and breath sounds normal. No respiratory distress.  Abdominal: Soft. Bowel sounds are normal. There is tenderness.  Musculoskeletal: She exhibits no edema.  Skin: No rash noted. No erythema.    ED Course  Procedures (including critical care time) Labs Review Labs Reviewed  BASIC METABOLIC PANEL  - Abnormal; Notable for the following:    Glucose, Bld 100 (*)    All other components within normal limits  PRO B NATRIURETIC PEPTIDE - Abnormal; Notable for the following:    Pro B Natriuretic peptide (BNP) 235.6 (*)    All other components within normal limits  CBC  PROTIME-INR  TROPONIN I  I-STAT TROPOININ, ED   Results for orders placed during the hospital encounter of 07/20/13  CBC      Result Value Ref Range   WBC 7.4  4.0 - 10.5 K/uL   RBC 4.16  3.87 - 5.11 MIL/uL   Hemoglobin 13.6  12.0 - 15.0 g/dL   HCT 39.9  36.0 - 46.0 %   MCV 95.9  78.0 - 100.0 fL   MCH 32.7  26.0 - 34.0 pg   MCHC 34.1  30.0 - 36.0 g/dL   RDW 13.2  11.5 - 15.5 %   Platelets 219  150 - 400 K/uL  BASIC METABOLIC PANEL      Result Value Ref Range   Sodium 141  137 - 147 mEq/L   Potassium 4.2  3.7 - 5.3 mEq/L   Chloride 102  96 - 112 mEq/L   CO2 24  19 - 32 mEq/L   Glucose, Bld 100 (*) 70 - 99 mg/dL   BUN 11  6 - 23 mg/dL   Creatinine, Ser 0.56  0.50 - 1.10 mg/dL  Calcium 9.5  8.4 - 10.5 mg/dL   GFR calc non Af Amer >90  >90 mL/min   GFR calc Af Amer >90  >90 mL/min  PRO B NATRIURETIC PEPTIDE      Result Value Ref Range   Pro B Natriuretic peptide (BNP) 235.6 (*) 0 - 125 pg/mL  PROTIME-INR      Result Value Ref Range   Prothrombin Time 14.0  11.6 - 15.2 seconds   INR 1.10  0.00 - 1.49  TROPONIN I      Result Value Ref Range   Troponin I <0.30  <0.30 ng/mL  I-STAT TROPOININ, ED      Result Value Ref Range   Troponin i, poc 0.00  0.00 - 0.08 ng/mL   Comment 3             Imaging Review Dg Chest Port 1 View  07/20/2013   CLINICAL DATA:  Chest pain and cough with history of CHF and previous smoking.  EXAM: PORTABLE CHEST - 1 VIEW  COMPARISON:  CT CHEST W/CM dated 06/05/2013; DG CHEST 2 VIEW dated 09/03/2011  FINDINGS: The lungs remain hyperinflated with hemidiaphragm flattening. There is no focal infiltrate. The cardiac silhouette is normal in size. The pulmonary vascularity is not engorged.  Areas of nodularity projecting in the mid to lower hemithoraces likely reflect nipple shadows. There is no pleural effusion or pneumothorax. Seven sternal wires are present. The uppermost wire is broken. The bony thorax exhibits no acute abnormality. Previous lower thoracic and upper lumbar fusion has occurred.  IMPRESSION: 1. There is hyperinflation consistent with COPD. There is no evidence of pneumonia nor CHF. 2. There is no pneumothorax or pleural effusion. 3. Areas of nodularity in both lungs likely reflect nipple shadows. When the patient can tolerate the procedure, a PA and lateral chest x-ray would be of value.   Electronically Signed   By: David  Martinique   On: 07/20/2013 11:43     EKG Interpretation   Date/Time:  Friday July 20 2013 10:33:02 EST Ventricular Rate:  113 PR Interval:  129 QRS Duration: 78 QT Interval:  323 QTC Calculation: 443 R Axis:   -66 Text Interpretation:  Sinus tachycardia Atrial premature complexes Right  atrial enlargement LAD, consider left anterior fascicular block Probable  left ventricular hypertrophy Changes noted new Atrial Premature complexes  and nonspecific ST/T wave changes Confirmed by Rane Blitch  MD, Soma Lizak ([id number])  on 07/20/2013 10:49:33 AM      MDM   Final diagnoses:  Chest pain    Patient with onset of chest pain this morning at 6:30. Patient has multiple stents. Patient's had a cough significant cough for the past 4 days with chest pain just started today. Patient's cardiologist is Dr. Alvester Chou. Discussed with the radiology they will evaluate the patient for consideration for admission and cardiac rule out. Workup here today negative EKG without any acute changes. Troponin negative chest x-ray more consistent with COPD no evidence of pneumonia or CHF. Patient's chest pain officially resolved here according to the patient with pain medication.    Mervin Kung, MD 07/20/13 [phone]

## 2013-07-20 NOTE — ED Provider Notes (Signed)
Spoke to Dr. Caryl Comes who feels that pt has more of a respiratory component, not necessarily cardiac.  Pt has a h/o COPD, has pursed lip breathing, sats are adequate.  ABG done and it is normal, no retention.  Pt may be anxious. Small dose of ativan IV given. Will consult hospitalist to see pt as well and can work with cardiology regarding admission.     [name]. Lewie Deman, MD 07/20/13 1850

## 2013-07-20 NOTE — ED Notes (Signed)
New and Old EKG given to Dr. Loraine Maple

## 2013-07-20 NOTE — ED Notes (Addendum)
At pt bedside, pt sitting up in bed, very SOB, stating she cannot catch her breath. HR 131, RR 37, O2 saturation 88%. Pt placed on 2L [place]. HR decreased to 108, RR 22, O2 saturation 98%. Pt reports she is feeling better but states she has a headache. MD notified.

## 2013-07-20 NOTE — ED Notes (Signed)
PT states she has a hx of 9 bypass grafts, 8 stents, MI x6-7, most recently 65mos ago. PT states she has rods and screws in her back as well. Reports hx of a-fib.  EMS Vitals 120/52, pulse 116, a-fib ED Vitals  96/65  Pulse 95, a-fib, tachypnea 30 RR, 99.4

## 2013-07-20 NOTE — H&P (Signed)
wrong note

## 2013-07-20 NOTE — ED Notes (Signed)
MD at bedside. 

## 2013-07-21 DIAGNOSIS — J40 Bronchitis, not specified as acute or chronic: Secondary | ICD-10-CM

## 2013-07-21 DIAGNOSIS — J438 Other emphysema: Secondary | ICD-10-CM

## 2013-07-21 LAB — CBC
HEMATOCRIT: 36.8 % (ref 36.0–46.0)
Hemoglobin: 12.5 g/dL (ref 12.0–15.0)
MCH: 33 pg (ref 26.0–34.0)
MCHC: 34 g/dL (ref 30.0–36.0)
MCV: 97.1 fL (ref 78.0–100.0)
Platelets: 179 10*3/uL (ref 150–400)
RBC: 3.79 MIL/uL — ABNORMAL LOW (ref 3.87–5.11)
RDW: 13.2 % (ref 11.5–15.5)
WBC: 4.5 10*3/uL (ref 4.0–10.5)

## 2013-07-21 LAB — BASIC METABOLIC PANEL
BUN: 10 mg/dL (ref 6–23)
CO2: 27 mEq/L (ref 19–32)
CREATININE: 0.6 mg/dL (ref 0.50–1.10)
Calcium: 8.8 mg/dL (ref 8.4–10.5)
Chloride: 101 mEq/L (ref 96–112)
GFR calc Af Amer: >90 mL/min (ref >90)
Glucose, Bld: 124 mg/dL — ABNORMAL HIGH (ref 70–99)
Potassium: 4.1 mEq/L (ref 3.7–5.3)
SODIUM: 140 meq/L (ref 137–147)

## 2013-07-21 LAB — INFLUENZA PANEL BY PCR (TYPE A & B)
H1N1 flu by pcr: NOT DETECTED
INFLBPCR: NEGATIVE
Influenza A By PCR: NEGATIVE

## 2013-07-21 LAB — HEPARIN LEVEL (UNFRACTIONATED): Heparin Unfractionated: 0.12 IU/mL — ABNORMAL LOW (ref 0.30–0.70)

## 2013-07-21 LAB — TROPONIN I: Troponin I: <0.3 ng/mL (ref <0.30)

## 2013-07-21 MED ORDER — PNEUMOCOCCAL VAC POLYVALENT 25 MCG/0.5ML IJ INJ
0.5000 mL | INJECTION | INTRAMUSCULAR | Status: AC
  Administered 2013-07-22: 0.5 mL via INTRAMUSCULAR
  Filled 2013-07-21: qty 0.5

## 2013-07-21 MED ORDER — MENTHOL 3 MG MT LOZG
1.0000 | LOZENGE | OROMUCOSAL | Status: DC | PRN
  Filled 2013-07-21: qty 9

## 2013-07-21 MED ORDER — HEPARIN BOLUS VIA INFUSION
1000.0000 [IU] | Freq: Once | INTRAVENOUS | Status: AC
  Administered 2013-07-21: 1000 [IU] via INTRAVENOUS
  Filled 2013-07-21: qty 1000

## 2013-07-21 MED ORDER — DICLOFENAC SODIUM 1 % TD GEL
4.0000 g | Freq: Four times a day (QID) | TRANSDERMAL | Status: DC
  Administered 2013-07-21 (×3): 4 g via TOPICAL
  Filled 2013-07-21: qty 100

## 2013-07-21 MED ORDER — PRASUGREL HCL 10 MG PO TABS
10.0000 mg | ORAL_TABLET | Freq: Every day | ORAL | Status: DC
  Administered 2013-07-21 – 2013-07-22 (×2): 10 mg via ORAL
  Filled 2013-07-21 (×2): qty 1

## 2013-07-21 MED ORDER — FLUTICASONE PROPIONATE 50 MCG/ACT NA SUSP
2.0000 | Freq: Every day | NASAL | Status: DC
  Administered 2013-07-22: 2 via NASAL
  Filled 2013-07-21: qty 16

## 2013-07-21 MED ORDER — OXYMETAZOLINE HCL 0.05 % NA SOLN
1.0000 | Freq: Two times a day (BID) | NASAL | Status: DC
  Administered 2013-07-21 – 2013-07-22 (×2): 1 via NASAL
  Filled 2013-07-21: qty 15

## 2013-07-21 NOTE — Progress Notes (Addendum)
[facility] Progress Note [place] TEAM 1 - Stepdown/ICU TEAM   [name] XBJ:[id number] DOB: August 31, 1950 DOA: 07/20/2013 PCP: Tivis Ringer, MD  Brief narrative: [name] is a 63 y.o. female presenting on 07/20/2013 with Stage IIIA (T2a, N2, MX) non-small cell lung cancer, squamous cell carcinoma diagnosed in September of 2013. Status post a course of concurrent chemoradiation with chemotherapy. Last dose was given on 04/10/2012 with partial response, patient is on observation therapy, History of COPD, CAD, who presents to the ED complaining of chest pain, dyspnea that started 3 days prior to admission. She relates worsening productive cough also. She describes chest pain as pleuritic. Cardiology was consulted for chest pain.  Patient was notice to be in mild respiratory distress, ABG with Oxygen at 72. She is 98 % on 2 L. She received IV solumedrol, nebulizer treatment, started on heparin gtt, IV ativan.   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    Protein-calorie malnutrition, severe - add ensure    Cardiomyopathy, ischemic - mild to moderate. EF ~45%      Respiratory failure - compensation   Code Status: DNR Family Communication: with husband, son and grandson Disposition Plan: follow in SDU  Consultants: Cardiology  Procedures: none  Antibiotics: Antibiotics Given (last 72 hours)   Date/Time  Action Medication Dose Rate   07/20/13 2223 Given   levofloxacin (LEVAQUIN) IVPB 500 mg 500 mg 100 mL/hr     DVT prophylaxis: Heparin infusion  Objective: Filed Weights   07/20/13 2010 07/20/13 2100 07/21/13 0300  Weight: 44.5 kg (98 lb 1.7 oz) 41.9 kg (92 lb 6 oz) 41.9 kg (92 lb 6 oz)   Blood pressure 96/54, pulse 91, temperature 97.6 F (36.4 C), temperature source Oral, resp. rate 20, height 5' 6.93" (1.7 m), weight 41.9 kg (92 lb 6 oz), SpO2 100.00%.  Intake/Output Summary (Last 24 hours) at 07/21/13 1009 Last data filed at 07/21/13 0800  Gross per 24 hour  Intake  99.81 ml  Output    750 ml  Net -650.19 ml     Exam: General: AAO x 3, No acute respiratory distress Lungs: Clear to auscultation bilaterally without wheezes or crackles- continuous dry cough  Cardiovascular: Regular rate and rhythm without murmur gallop or rub normal S1 and S2 Abdomen: Nontender, nondistended, soft, bowel sounds positive, no rebound, no ascites, no appreciable mass Extremities: No significant cyanosis, clubbing, or edema bilateral lower extremities  Data Reviewed: Basic Metabolic Panel:  Recent Labs Lab 07/20/13 1030 07/21/13 0255  NA 141 140  K 4.2 4.1  CL 102 101  CO2 24 27  GLUCOSE 100* 124*  BUN 11 10  CREATININE 0.56 0.60  CALCIUM 9.5 8.8   Liver Function Tests: No results found for this basename: AST, ALT, ALKPHOS, BILITOT, PROT, ALBUMIN,  in the last 168 hours No results found for this basename: LIPASE, AMYLASE,  in the last 168 hours No results found for this  basename: AMMONIA,  in the last 168 hours CBC:  Recent Labs Lab 07/20/13 1030 07/21/13 0255  WBC 7.4 4.5  HGB 13.6 12.5  HCT 39.9 36.8  MCV 95.9 97.1  PLT 219 179   Cardiac Enzymes:  Recent Labs Lab 07/20/13 1220 07/20/13 1821 07/21/13 0255  TROPONINI <0.30 <0.30 <0.30   BNP (last 3 results)  Recent Labs  07/20/13 1030  PROBNP 235.6*   CBG: No results found for this basename: GLUCAP,  in the  last 168 hours  Recent Results (from the past 240 hour(s))  MRSA PCR SCREENING     Status: None   Collection Time    07/20/13  8:43 PM      Result Value Ref Range Status   MRSA by PCR NEGATIVE  NEGATIVE Final   Comment:            The GeneXpert MRSA Assay (FDA     approved for NASAL specimens     only), is one component of a     comprehensive MRSA colonization     surveillance program. It is not     intended to diagnose MRSA     infection nor to guide or     monitor treatment for     MRSA infections.     Studies:  Recent x-ray studies have been reviewed in detail by the Attending Physician  Scheduled Meds:  Scheduled Meds: . aspirin  81 mg Oral Daily  . atorvastatin  80 mg Oral QHS  . budesonide-formoterol  2 puff Inhalation BID  . chlorpheniramine-HYDROcodone  5 mL Oral Q12H  . diclofenac sodium  4 g Topical QID  . ezetimibe  10 mg Oral Daily  . famotidine  20 mg Oral QHS  . fluticasone  2 spray Each Nare Daily  . guaiFENesin  600 mg Oral BID  . ipratropium-albuterol  3 mL Nebulization Q4H  . levofloxacin (LEVAQUIN) IV  500 mg Intravenous Q24H  . methylPREDNISolone (SOLU-MEDROL) injection  40 mg Intravenous 3 times per day  . montelukast  10 mg Oral QHS  . nystatin  5 mL Oral QID  . oxymetazoline  1 spray Each Nare BID  . pantoprazole  40 mg Oral Daily  . sodium chloride  3 mL Intravenous Q12H   Continuous Infusions: . heparin 700 Units/hr (07/21/13 0800)    Time spent on care of this patient: > 35 min   Debbe Odea, MD  [facility] Office  [phone] Pager - Text Page per Shea Evans as per below:  On-Call/Text Page:      [email or web address]  If 7PM-7AM, please contact night-coverage [email or web address] 07/21/2013, 10:09 AM   LOS: 1 day

## 2013-07-21 NOTE — Progress Notes (Signed)
ANTICOAGULATION CONSULT NOTE - Follow Up Consult  Pharmacy Consult for heparin Indication: chest pain/ACS  Labs:  Recent Labs  07/20/13 1030 07/20/13 1220 07/20/13 1821 07/21/13 0255  HGB 13.6  --   --  12.5  HCT 39.9  --   --  36.8  PLT 219  --   --  179  LABPROT 14.0  --   --   --   INR 1.10  --   --   --   HEPARINUNFRC  --   --   --  0.12*  CREATININE 0.56  --   --   --   TROPONINI  --  <0.30 <0.30 <0.30    Assessment: 62yo female subtherapeutic on heparin with initial dosing for CP.  Goal of Therapy:  Heparin level 0.3-0.7 units/ml   Plan:  Will rebolus with heparin 1000 units and increase gtt by 4 units/kg/hr to 700 units/hr and check level in [place], PharmD, BCPS  07/21/2013,4:35 AM

## 2013-07-21 NOTE — Progress Notes (Signed)
07/21/13 patient transferred from 2 H today to room 5 W 25 Dx of Acute  And Chronic respiratory failure. On 02/2 L N/C.wgt 43 kg and hgt 5'7". History of Lung CA,CAD,CABG,Pneumonia,COPD,and Gerd.

## 2013-07-21 NOTE — Progress Notes (Signed)
Patient ID: [name], female   DOB: May 30, 1950, 63 y.o.   MRN: [id number]   SUBJECTIVE: Patient continues to cough.  She has pleuritic chest pain but it is somewhat improved.  She is short of breath.   Scheduled Meds: . aspirin  81 mg Oral Daily  . atorvastatin  80 mg Oral QHS  . budesonide-formoterol  2 puff Inhalation BID  . chlorpheniramine-HYDROcodone  5 mL Oral Q12H  . diclofenac sodium  4 g Topical QID  . ezetimibe  10 mg Oral Daily  . famotidine  20 mg Oral QHS  . fluticasone  2 spray Each Nare Daily  . guaiFENesin  600 mg Oral BID  . ipratropium-albuterol  3 mL Nebulization Q4H  . levofloxacin (LEVAQUIN) IV  500 mg Intravenous Q24H  . methylPREDNISolone (SOLU-MEDROL) injection  40 mg Intravenous 3 times per day  . montelukast  10 mg Oral QHS  . nystatin  5 mL Oral QID  . oxymetazoline  1 spray Each Nare BID  . pantoprazole  40 mg Oral Daily  . prasugrel  10 mg Oral Daily  . sodium chloride  3 mL Intravenous Q12H   Continuous Infusions:  PRN Meds:.sodium chloride, albuterol, diazepam, fentaNYL, HYDROcodone-acetaminophen, menthol-cetylpyridinium, nitroGLYCERIN, ondansetron (ZOFRAN) IV, ondansetron, sodium chloride    Filed Vitals:   07/21/13 0757 07/21/13 0802 07/21/13 0804 07/21/13 0805  BP:   96/54   Pulse:  86 91   Temp:  97.6 F (36.4 C)    TempSrc:  Oral    Resp:   20   Height:      Weight:      SpO2: 97%  96% 100%    Intake/Output Summary (Last 24 hours) at 07/21/13 1219 Last data filed at 07/21/13 1000  Gross per 24 hour  Intake 473.81 ml  Output    750 ml  Net -276.19 ml    LABS: Basic Metabolic Panel:  Recent Labs  07/20/13 1030 07/21/13 0255  NA 141 140  K 4.2 4.1  CL 102 101  CO2 24 27  GLUCOSE 100* 124*  BUN 11 10  CREATININE 0.56 0.60  CALCIUM 9.5 8.8   Liver Function Tests: No results found for this basename: AST, ALT, ALKPHOS, BILITOT, PROT, ALBUMIN,  in the last 72 hours No results found for this basename: LIPASE,  AMYLASE,  in the last 72 hours CBC:  Recent Labs  07/20/13 1030 07/21/13 0255  WBC 7.4 4.5  HGB 13.6 12.5  HCT 39.9 36.8  MCV 95.9 97.1  PLT 219 179   Cardiac Enzymes:  Recent Labs  07/20/13 1220 07/20/13 1821 07/21/13 0255  TROPONINI <0.30 <0.30 <0.30   BNP: No components found with this basename: POCBNP,  D-Dimer:  Recent Labs  07/20/13 2100  DDIMER 0.36   Hemoglobin A1C: No results found for this basename: HGBA1C,  in the last 72 hours Fasting Lipid Panel: No results found for this basename: CHOL, HDL, LDLCALC, TRIG, CHOLHDL, LDLDIRECT,  in the last 72 hours Thyroid Function Tests: No results found for this basename: TSH, T4TOTAL, FREET3, T3FREE, THYROIDAB,  in the last 72 hours Anemia Panel: No results found for this basename: VITAMINB12, FOLATE, FERRITIN, TIBC, IRON, RETICCTPCT,  in the last 72 hours  RADIOLOGY: Dg Chest Port 1 View  07/20/2013   CLINICAL DATA:  Chest pain and cough with history of CHF and previous smoking.  EXAM: PORTABLE CHEST - 1 VIEW  COMPARISON:  CT CHEST W/CM dated 06/05/2013; DG CHEST 2 VIEW dated 09/03/2011  FINDINGS: The lungs remain hyperinflated with hemidiaphragm flattening. There is no focal infiltrate. The cardiac silhouette is normal in size. The pulmonary vascularity is not engorged. Areas of nodularity projecting in the mid to lower hemithoraces likely reflect nipple shadows. There is no pleural effusion or pneumothorax. Seven sternal wires are present. The uppermost wire is broken. The bony thorax exhibits no acute abnormality. Previous lower thoracic and upper lumbar fusion has occurred.  IMPRESSION: 1. There is hyperinflation consistent with COPD. There is no evidence of pneumonia nor CHF. 2. There is no pneumothorax or pleural effusion. 3. Areas of nodularity in both lungs likely reflect nipple shadows. When the patient can tolerate the procedure, a PA and lateral chest x-ray would be of value.   Electronically Signed   By: David   Martinique   On: 07/20/2013 11:43    PHYSICAL EXAM General: NAD Neck: No JVD, no thyromegaly or thyroid nodule.  Lungs: Rhonchi bilaterally, prolonged expiratory phase.  CV: Nondisplaced PMI.  Heart regular S1/S2, no S3/S4, no murmur.  No peripheral edema.  No carotid bruit.  Normal pedal pulses.  Abdomen: Soft, nontender, no hepatosplenomegaly, no distention.  Neurologic: Alert and oriented x 3.  Psych: Normal affect. Extremities: No clubbing or cyanosis.   TELEMETRY: Reviewed telemetry wandering atrial pacemaker noted  ASSESSMENT AND PLAN: 63 yo with history of CAD s/p CABG and COPD presented with dyspnea and pleuritic chest pain.  1. CAD: Patient has ruled out for MI.  BNP minimally elevated, D dimer negative.  I do not think this is ACS.  Suspect symptoms are secondary to COPD exacerbation.  May stop heparin gtt, continue home meds (including Effient).  2. COPD: Suspect COPD exacerbation is the main issue here.  She is being treated for this by primary team.   Loralie Champagne 07/21/2013 12:22 PM

## 2013-07-22 DIAGNOSIS — I2510 Atherosclerotic heart disease of native coronary artery without angina pectoris: Secondary | ICD-10-CM

## 2013-07-22 DIAGNOSIS — K2940 Chronic atrophic gastritis without bleeding: Secondary | ICD-10-CM

## 2013-07-22 DIAGNOSIS — I509 Heart failure, unspecified: Secondary | ICD-10-CM

## 2013-07-22 DIAGNOSIS — F411 Generalized anxiety disorder: Secondary | ICD-10-CM

## 2013-07-22 DIAGNOSIS — I219 Acute myocardial infarction, unspecified: Secondary | ICD-10-CM

## 2013-07-22 LAB — CBC
HCT: 34.6 % — ABNORMAL LOW (ref 36.0–46.0)
HEMOGLOBIN: 11.9 g/dL — AB (ref 12.0–15.0)
MCH: 33.1 pg (ref 26.0–34.0)
MCHC: 34.4 g/dL (ref 30.0–36.0)
MCV: 96.1 fL (ref 78.0–100.0)
Platelets: 190 10*3/uL (ref 150–400)
RBC: 3.6 MIL/uL — ABNORMAL LOW (ref 3.87–5.11)
RDW: 13 % (ref 11.5–15.5)
WBC: 9 10*3/uL (ref 4.0–10.5)

## 2013-07-22 MED ORDER — PREDNISONE (PAK) 10 MG PO TABS: ORAL_TABLET | Freq: Every day | ORAL | Status: DC

## 2013-07-22 MED ORDER — GUAIFENESIN ER 600 MG PO TB12: 1200.0000 mg | ORAL_TABLET | Freq: Two times a day (BID) | ORAL | Status: DC

## 2013-07-22 MED ORDER — LEVOFLOXACIN 750 MG PO TABS: 750.0000 mg | ORAL_TABLET | Freq: Every day | ORAL | Status: DC

## 2013-07-22 NOTE — Discharge Summary (Signed)
Physician Discharge Summary  [name] DVV:[id number] DOB: Jul 26, 1950 DOA: 07/20/2013  PCP: Tivis Ringer, MD  Admit date: 07/20/2013 Discharge date: 07/22/2013  Time spent: 40 minutes  Recommendations for Outpatient Follow-up:  1. 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 EF ~45%   Chest pain   Respiratory failure   Discharge Condition: Stable  Diet recommendation: Heart healthy diet  Filed Weights   07/21/13 0300 07/21/13 1610 07/22/13 0637  Weight: 41.9 kg (92 lb 6 oz) 44 kg (97 lb) 41.912 kg (92 lb 6.4 oz)    History of present illness:  [name] is a 63 y.o. female Stage IIIA (T2a, N2, MX) non-small cell lung cancer, squamous cell carcinoma diagnosed in September of 2013.Status post a course of concurrent chemoradiation with chemotherapy. Last dose was given on 04/10/2012 with partial response, patient is on observation therapy, History of COPD, CAD, who presents to the ED complaining of chest pain, dyspnea that started 3 days prior to admission. She relates worsening productive cough also. She describes chest pain as pleuritic. Cardiology was consulted for chest pain.  Patient was notice to be in mild respiratory distress, ABG with Oxygen at 72. She is 98 % on 2 L. She received IV solumedrol, nebulizer treatment, started on heparin gtt, IV ativan. Patient feels some improvement of dyspnea during my evaluation.   Hospital Course:   1. Acute respiratory failure: Patient presents to the hospital with hypoxic respiratory failure likely secondary to COPD exacerbation. Patient started on inhaled bronchodilators, systemic  steroids, oxygen, antibiotics and antitussives. Has negative flu PCR. Chest x-ray did not show acute findings. Patient treated as COPD exacerbation. On discharge patient oxygen saturation with walking on room air was 66-70%, with 2 L she Oxygen saturation of more than 94%. Patient discharged on 2 L of oxygen.  2. Acute COPD exacerbation: Patient has history of non-small cell lung cancer in 2013, she COPD, came in with productive cough and wheezing. Treated as above with antibiotics, bronchodilators, antibiotics, antitussives, oxygen and mucolytics. Discharge on Levaquin, started prednisone taper and Mucinex.  3. Chest pain: Pleuritic chest pain secondary to COPD, patient mentions the pain is worse with cough and it's likely secondary to prolonged coughing episodes. At the time of admission cardiology evaluated the patient and his CS was ruled out by negative 3 sets of cardiac enzymes and  4. Protein calorie malnutrition: Severe.  5. Ischemic cardiomyopathy: Mild to moderate with ejection fraction of 45%, there is no symptoms or signs of decompensation.  Procedures:  None  Consultations:  Cardiology  Discharge Exam: Filed Vitals:   07/22/13 0637  BP: 103/63  Pulse: 73  Temp: 98.1 F (36.7 C)  Resp: 20   General: Alert and awake, oriented x3, not in any acute distress. HEENT: anicteric sclera, pupils reactive to light and accommodation, EOMI CVS: S1-S2 clear, no murmur rubs or gallops Chest: clear to auscultation bilaterally, no wheezing, rales or rhonchi Abdomen: soft nontender, nondistended, normal bowel sounds, no organomegaly Extremities: no cyanosis, clubbing or edema noted bilaterally Neuro: Cranial nerves II-XII intact, no focal neurological deficits  Discharge Instructions  Discharge Orders   Future Appointments Provider Department Dept Phone   10/05/2013 9:00  AM [facility] [phone]   10/05/2013 10:00 AM [facility] [place]  [facility] [phone]   Liquids only 4 hours prior to your exam. Any medications can be taken as usual. Please arrive 15 min prior to your scheduled exam time.   10/11/2013 9:30 AM Curt Bears, MD [facility] [phone]   Future Orders Complete By Expires   Diet - low sodium heart healthy  As directed    Increase activity slowly  As directed        Medication List         albuterol 108 (90 BASE) MCG/ACT inhaler  Commonly known as:  PROVENTIL HFA;VENTOLIN HFA  Inhale 2 puffs into the lungs every 4 (four) hours as needed for wheezing or shortness of breath.     aspirin 81 MG EC tablet  Take 81 mg by mouth daily.     atorvastatin 80 MG tablet  Commonly known as:  LIPITOR  Take 80 mg by mouth at bedtime.     budesonide-formoterol 160-4.5 MCG/ACT inhaler  Commonly known as:  SYMBICORT  Inhale 2 puffs into the lungs 2 (two) times daily.     diazepam 5 MG tablet  Commonly known as:  VALIUM  Take 5 mg by mouth 2 (two) times daily.     ezetimibe 10 MG tablet  Commonly known as:  ZETIA  Take 10 mg by mouth daily.     famotidine 20 MG tablet  Commonly known as:  PEPCID  Take 20 mg by mouth at bedtime.     guaiFENesin 600 MG 12 hr tablet  Commonly known as:  MUCINEX  Take 2 tablets (1,200 mg total) by mouth 2 (two) times daily.     HYDROcodone-acetaminophen 5-325 MG per tablet  Commonly known as:  NORCO/VICODIN  Take 1 tablet by mouth every 8 (eight) hours as needed for moderate pain.     levofloxacin 750 MG tablet  Commonly known as:  LEVAQUIN  Take 1 tablet (750 mg total) by mouth daily.     montelukast 10 MG tablet  Commonly known as:  SINGULAIR  Take 10 mg by mouth at bedtime.     nitroGLYCERIN 0.4 MG SL tablet  Commonly known as:  NITROSTAT  Place 0.4 mg under the tongue every 5 (five) minutes as needed. For chest pain     nystatin 100000 UNIT/ML suspension  Commonly known as:  MYCOSTATIN  Take 5 mLs  by mouth 4 (four) times daily.     omeprazole 20 MG capsule  Commonly known as:  PRILOSEC  Take 20 mg by mouth 2 (two) times daily.     prasugrel 10 MG Tabs tablet  Commonly known as:  EFFIENT  Take 1 tablet (10 mg total) by mouth daily.     predniSONE 10 MG tablet  Commonly known as:  STERAPRED UNI-PAK  Take by mouth daily. Take 6-5-4-3-2-1 tab PO daily till gone     tiotropium 18 MCG inhalation capsule  Commonly known as:  SPIRIVA  Place 18 mcg into inhaler and inhale daily.     Vitamin D-3 5000 UNITS Tabs  Take 1 tablet by mouth daily.       Allergies  Allergen Reactions  . Amiodarone Other (See Comments)    Drops HR too low, SOB, fatigue  . Clopidogrel Bisulfate Hives  . Dilaudid [Hydromorphone Hcl] Other (See Comments)    "I couldn't function for 2 days."  .  Multaq [Dronedarone] Other (See Comments)    Heart races        Follow-up Information   Follow up with Tivis Ringer, MD.   Specialty:  Internal Medicine   Contact information:   [facility] [place] [address] [phone]        The results of significant diagnostics from this hospitalization (including imaging, microbiology, ancillary and laboratory) are listed below for reference.    Significant Diagnostic Studies: Dg Chest Port 1 View  07/20/2013   CLINICAL DATA:  Chest pain and cough with history of CHF and previous smoking.  EXAM: PORTABLE CHEST - 1 VIEW  COMPARISON:  CT CHEST W/CM dated 06/05/2013; DG CHEST 2 VIEW dated 09/03/2011  FINDINGS: The lungs remain hyperinflated with hemidiaphragm flattening. There is no focal infiltrate. The cardiac silhouette is normal in size. The pulmonary vascularity is not engorged. Areas of nodularity projecting in the mid to lower hemithoraces likely reflect nipple shadows. There is no pleural effusion or pneumothorax. Seven sternal wires are present. The uppermost wire is broken. The bony thorax exhibits no acute  abnormality. Previous lower thoracic and upper lumbar fusion has occurred.  IMPRESSION: 1. There is hyperinflation consistent with COPD. There is no evidence of pneumonia nor CHF. 2. There is no pneumothorax or pleural effusion. 3. Areas of nodularity in both lungs likely reflect nipple shadows. When the patient can tolerate the procedure, a PA and lateral chest x-ray would be of value.   Electronically Signed   By: David  Martinique   On: 07/20/2013 11:43    Microbiology: Recent Results (from the past 240 hour(s))  MRSA PCR SCREENING     Status: None   Collection Time    07/20/13  8:43 PM      Result Value Ref Range Status   MRSA by PCR NEGATIVE  NEGATIVE Final   Comment:            The GeneXpert MRSA Assay (FDA     approved for NASAL specimens     only), is one component of a     comprehensive MRSA colonization     surveillance program. It is not     intended to diagnose MRSA     infection nor to guide or     monitor treatment for     MRSA infections.     Labs: Basic Metabolic Panel:  Recent Labs Lab 07/20/13 1030 07/21/13 0255  NA 141 140  K 4.2 4.1  CL 102 101  CO2 24 27  GLUCOSE 100* 124*  BUN 11 10  CREATININE 0.56 0.60  CALCIUM 9.5 8.8   Liver Function Tests: No results found for this basename: AST, ALT, ALKPHOS, BILITOT, PROT, ALBUMIN,  in the last 168 hours No results found for this basename: LIPASE, AMYLASE,  in the last 168 hours No results found for this basename: AMMONIA,  in the last 168 hours CBC:  Recent Labs Lab 07/20/13 1030 07/21/13 0255 07/22/13 0528  WBC 7.4 4.5 9.0  HGB 13.6 12.5 11.9*  HCT 39.9 36.8 34.6*  MCV 95.9 97.1 96.1  PLT 219 179 190   Cardiac Enzymes:  Recent Labs Lab 07/20/13 1220 07/20/13 1821 07/21/13 0255  TROPONINI <0.30 <0.30 <0.30   BNP: BNP (last 3 results)  Recent Labs  07/20/13 1030  PROBNP 235.6*   CBG: No results found for this basename: GLUCAP,  in the last 168 hours     Signed:  [facility]  A  [facility]  07/22/2013, 11:10 AM

## 2013-07-22 NOTE — Progress Notes (Signed)
07/22/13 Patient being discharged today, IV site removed and discharge instructions reviewed with patient.

## 2013-07-22 NOTE — Progress Notes (Signed)
SATURATION QUALIFICATIONS: (This note is used to comply with regulatory documentation for home oxygen)  Patient Saturations on Room Air at Rest = 88%  Patient Saturations on Room Air while Ambulating = 60-70%  Patient Saturations on 2 Liters of oxygen while Ambulating = 94%  Please briefly explain why patient needs home oxygen:

## 2013-07-22 NOTE — Progress Notes (Signed)
    Primary cardiologist: Dr. Quay Burow  Subjective:    No active chest pain, cough improving.  Objective:   Temp:  [97.5 F (36.4 C)-98.3 F (36.8 C)] 98.1 F (36.7 C) (03/01 0637) Pulse Rate:  [70-117] 73 (03/01 0637) Resp:  [18-20] 20 (03/01 0637) BP: (81-106)/(39-68) 103/63 mmHg (03/01 0637) SpO2:  [70 %-99 %] 94 % (03/01 1038) FiO2 (%):  [28 %] 28 % (03/01 0855) Weight:  [92 lb 6.4 oz (41.912 kg)-97 lb (44 kg)] 92 lb 6.4 oz (41.912 kg) (03/01 0637) Last BM Date: 07/20/13  Filed Weights   07/21/13 0300 07/21/13 1610 07/22/13 0637  Weight: 92 lb 6 oz (41.9 kg) 97 lb (44 kg) 92 lb 6.4 oz (41.912 kg)    Intake/Output Summary (Last 24 hours) at 07/22/13 1102 Last data filed at 07/22/13 1034  Gross per 24 hour  Intake   1123 ml  Output      1 ml  Net   1122 ml    Telemetry: Sinus rhythm with PACs.  Exam:  General: Chronically ill-appearing, no distress.  Lungs: Decreased breath sounds throughout with prolonged expiratory phase.  Cardiac: RRR with ectopy, no gallop.  Extremities: No pitting edema.    Lab Results:  Basic Metabolic Panel:  Recent Labs Lab 07/20/13 1030 07/21/13 0255  NA 141 140  K 4.2 4.1  CL 102 101  CO2 24 27  GLUCOSE 100* 124*  BUN 11 10  CREATININE 0.56 0.60  CALCIUM 9.5 8.8    CBC:  Recent Labs Lab 07/20/13 1030 07/21/13 0255 07/22/13 0528  WBC 7.4 4.5 9.0  HGB 13.6 12.5 11.9*  HCT 39.9 36.8 34.6*  MCV 95.9 97.1 96.1  PLT 219 179 190    Cardiac Enzymes:  Recent Labs Lab 07/20/13 1220 07/20/13 1821 07/21/13 0255  TROPONINI <0.30 <0.30 <0.30    Medications:   Scheduled Medications: . aspirin  81 mg Oral Daily  . atorvastatin  80 mg Oral QHS  . budesonide-formoterol  2 puff Inhalation BID  . chlorpheniramine-HYDROcodone  5 mL Oral Q12H  . diclofenac sodium  4 g Topical QID  . ezetimibe  10 mg Oral Daily  . famotidine  20 mg Oral QHS  . fluticasone  2 spray Each Nare Daily  . guaiFENesin  600 mg Oral  BID  . ipratropium-albuterol  3 mL Nebulization Q4H  . levofloxacin (LEVAQUIN) IV  500 mg Intravenous Q24H  . methylPREDNISolone (SOLU-MEDROL) injection  40 mg Intravenous 3 times per day  . montelukast  10 mg Oral QHS  . nystatin  5 mL Oral QID  . oxymetazoline  1 spray Each Nare BID  . pantoprazole  40 mg Oral Daily  . pneumococcal 23 valent vaccine  0.5 mL Intramuscular Tomorrow-1000  . prasugrel  10 mg Oral Daily  . sodium chloride  3 mL Intravenous Q12H      PRN Medications:  sodium chloride, albuterol, diazepam, fentaNYL, HYDROcodone-acetaminophen, menthol-cetylpyridinium, nitroGLYCERIN, ondansetron (ZOFRAN) IV, ondansetron, sodium chloride   Assessment:   1. Presentation with shortness of breath and pleuritic chest pain, cardiac markers argue against ACS.  2. History of CAD status post CABG. Plan to continue medical therapy for now.  3. COPD exacerbation.   Plan/Discussion:    Continue medical management of CAD. Currently on aspirin, Effient, Lipitor, Zetia. No beta blocker with significant COPD. She will need to have routine cardiac followup with Dr. Gwenlyn Found following discharge.   Satira Sark, M.D., F.A.C.C.

## 2013-07-22 NOTE — Progress Notes (Signed)
07/22/13 patient discharged home, IV site removed, discharge instructions reviewed.

## 2013-07-22 NOTE — Progress Notes (Signed)
07/22/13 1038 Patient was ambulated without oxygen in hallway was 66-70, when 2 liter oxygen was 94.

## 2013-07-22 NOTE — Progress Notes (Signed)
   CARE MANAGEMENT NOTE 07/22/2013  Patient:  [name], [name]   Account Number:  [id number]  Date Initiated:  07/22/2013  Documentation initiated by:  [facility]  Subjective/Objective Assessment:   adm: Acute and chronic respiratory failure     Action/Plan:   discharge   Anticipated DC Date:  07/22/2013   Anticipated DC Plan:  [place]  CM consult      Choice offered to / List presented to:     DME arranged  [facility]      DME agency  [facility].        Status of service:  Completed, signed off Medicare Important Message given?   (If response is "NO", the following Medicare IM given date fields will be blank) Date Medicare IM given:   Date Additional Medicare IM given:    Discharge Disposition:  HOME/SELF CARE  Per UR Regulation:    If discussed at Long Length of Stay Meetings, dates discussed:    Comments:  07/23/13 12:58 CM received call to notify of pt pickup of family.  CM called DME delivery who was already on way to deliver 02 to room.  No other CM needs were communicated. Mariane Masters, BSN, CM [phone].

## 2013-07-24 NOTE — Progress Notes (Signed)
Utilization review completed.  

## 2013-07-30 ENCOUNTER — Other Ambulatory Visit: Payer: Self-pay | Admitting: Pulmonary Disease

## 2013-07-30 NOTE — Telephone Encounter (Signed)
Please advise on refill. Thanks. 

## 2013-08-01 ENCOUNTER — Other Ambulatory Visit: Payer: Self-pay | Admitting: Pulmonary Disease

## 2013-08-01 ENCOUNTER — Telehealth: Payer: Self-pay | Admitting: Pulmonary Disease

## 2013-08-01 MED ORDER — NYSTATIN 100000 UNIT/ML MT SUSP: OROMUCOSAL | Status: DC

## 2013-08-01 NOTE — Telephone Encounter (Signed)
Called, spoke with pt.  Informed her of below recs per Brock.  She verbalized understanding of this and is aware [facility]atin [facility]. [facility], pt states Dr. Halford Chessman "always gives me the pill to go along with the mouthwash."  Pt reports the mouthwash alone will not clear up her thrush.   Pls advise.  Thank you.

## 2013-08-01 NOTE — Telephone Encounter (Signed)
I would like for her to try it, and can send message to Dr. Halford Chessman to see if he will ok diflucan.

## 2013-08-01 NOTE — Telephone Encounter (Signed)
Pt aware that I have sent Rx to [facility] [place] [facility] [place] [place]. Will forward message to VS to advise if he would like to add Diflucan for patient. Pt aware that VS is back in the office on Friday 08-03-13.  Pt will try nystatin alone until decision made by VS.

## 2013-08-01 NOTE — Telephone Encounter (Signed)
Nystatin suspension, 400,000 IU swish and swallow tid for 5 days.

## 2013-08-01 NOTE — Telephone Encounter (Signed)
White patchy areas in mouth and solid white tongue.  Pt states that her nose is sore as well; on O2 24/7 now. Using spacer with Symbicort and rinsing her mouth as well.    [facility] [place] [place].

## 2013-08-02 ENCOUNTER — Telehealth: Payer: Self-pay | Admitting: *Deleted

## 2013-08-02 NOTE — Telephone Encounter (Signed)
Dr. Julien Nordmann notified of call from daughter.  Verbal order received and read back for no new orders from this office but patient needs to see pulmonologist.  Daughter stated she will call Dr. Dagmar Hait.  Routed today's note to Dr. Dagmar Hait.

## 2013-08-02 NOTE — Telephone Encounter (Signed)
Please advise pt to call back in few days if not better with nystatin, and then can send script for diflucan.

## 2013-08-02 NOTE — Telephone Encounter (Signed)
Called and made pt aware.

## 2013-08-02 NOTE — Telephone Encounter (Signed)
   Provider input needed: work-in appointment today, weakness, breathing    Reason for call: "Not any better since hospital discharge, weight, breathing, weakness"  Constitutional: positive for fatigue and weakness, nausea Respiratory: positive for gasping, coughing   Patient last received chemotherapy/ treatment on 04-10-2012 carboplatin, taxol  Patient was last seen in the office on 06-07-2013  Next appt is 10-11-2013 after scans  Is patient having fevers greater than 100.5?  no   Is patient having uncontrolled pain, or new pain? no   Is patient having new back pain that changes with position (worsens or eases when laying down?)  no   Is patient able to eat and drink? yes, "has nausea, weighs 96 lbs, we buy ensure to help her weight, she drinks well but doesn't eat much"    Is patient able to pass stool without difficulty?   yes     Is patient having uncontrolled nausea?  no, "was given antibiotic in hospital that caused nausea and diarrhea and was given medicine to help with this"    family calls 08/02/2013 with complaint of  Constitutional: positive for fatigue, weight loss and "weakness, can't walk, just sits around in bed stating 'I have fought a lot of things but I can't fight this', she is not any better" Respiratory: positive for cough and "We can hear her gasping with each breath, d/c'd with home oxygen on 2L but we called EMS who increased it to 4L and she now is wearing oxygen at 4L" "She does not sleep at night gasping for breath, I don't know if she has started smoking again, she weighs only 96lbs and not eating well, she is not getting any better, should she see Dr. Julien Nordmann?" Do we need to call Dr. Dagmar Hait?   Summary Based on the above information advised family to I will notify Dr. Julien Nordmann of the request but to also contact PCP and/or pulmonary providers.     [name], [name]  08/02/2013, 8:43 AM   Background Info  [name]   DOB: February 22, 1951    MR#: [id number]   CSN#   [id number] 08/02/2013

## 2013-08-07 ENCOUNTER — Ambulatory Visit (INDEPENDENT_AMBULATORY_CARE_PROVIDER_SITE_OTHER): Payer: Medicaid Other | Admitting: Cardiovascular Disease

## 2013-08-07 ENCOUNTER — Encounter: Payer: Self-pay | Admitting: Cardiovascular Disease

## 2013-08-07 VITALS — BP 100/80 | HR 76 | Ht 67.0 in | Wt 94.0 lb

## 2013-08-07 DIAGNOSIS — I25810 Atherosclerosis of coronary artery bypass graft(s) without angina pectoris: Secondary | ICD-10-CM

## 2013-08-07 DIAGNOSIS — J9620 Acute and chronic respiratory failure, unspecified whether with hypoxia or hypercapnia: Secondary | ICD-10-CM

## 2013-08-07 DIAGNOSIS — I10 Essential (primary) hypertension: Secondary | ICD-10-CM

## 2013-08-07 NOTE — Assessment & Plan Note (Signed)
She was just hospitalized several weeks ago on the hospitalist service. Cardiology was consulted. Her problems are multifactorial including stage IIIB lung cancer, COPD and moderate LV dysfunction.

## 2013-08-07 NOTE — Progress Notes (Signed)
08/07/2013 [name]   1951/05/17  [id number]  Primary Physician Tivis Ringer, MD Primary Cardiologist: Lorretta Harp MD [name]   HPI:  The patient is an unfortunate 63 year old, thin and frail-appearing, [name] whose husband [name] is also a patient of mine. She is a mother of 2, grandmother to 2 grandchildren. I last saw her 6 months ago and have been taking care of her since the early 1990s. She has a history of CAD status post coronary artery bypass grafting in 1995, as well as left subclavian artery PCI and stenting by myself for subclavian steal. Her other problems include discontinued tobacco abuse January 2011, hypertension, dyslipidemia and GERD. Her last cath was performed Oct 03, 2009, revealing patent grafts. She has recently been diagnosed with non-small cell stage IIIA lung cancer diagnosed by biopsy performed by Dr. Roxan [name] and has had chemo and radiation therapy. Dr. Earlie [name] follows her as an outpatient. She denies chest pain but has chronic shortness of breath. Her last Myoview performed 07/14/2010 was nonischemic. She was admitted to hospital in August with unstable angina and underwent cardiac catheterization by Dr. Glenetta Hew revealing patent grafts with a high-grade stenosis in her RCA SVG which was stented with a drug-eluting stent. She was hospitalized several weeks ago for acute on chronic respiratory failure thought to be multifactorial. She has advanced COPD, stage IIIB squamous cell lung cancer and moderate LV dysfunction. She is currently on 4 L of oxygen. She appears to be protein calorie malnourished and is losing weight.   Current Outpatient Prescriptions  Medication Sig Dispense Refill  . albuterol (PROVENTIL HFA;VENTOLIN HFA) 108 (90 BASE) MCG/ACT inhaler Inhale 2 puffs into the lungs every 4 (four) hours as needed for wheezing or shortness of breath.      Marland Kitchen aspirin 81 MG EC tablet Take 81 mg by mouth  daily.        Marland Kitchen atorvastatin (LIPITOR) 80 MG tablet Take 80 mg by mouth at bedtime.      . budesonide-formoterol (SYMBICORT) 160-4.5 MCG/ACT inhaler Inhale 2 puffs into the lungs 2 (two) times daily.      . Cholecalciferol (VITAMIN D-3) 5000 UNITS TABS Take 1 tablet by mouth daily.      . diazepam (VALIUM) 5 MG tablet Take 5 mg by mouth 2 (two) times daily.      Marland Kitchen ezetimibe (ZETIA) 10 MG tablet Take 10 mg by mouth daily.        . famotidine (PEPCID) 20 MG tablet Take 20 mg by mouth at bedtime.      . fluconazole (DIFLUCAN) 100 MG tablet TAKE 1 TABLET BY MOUTH EVERY DAY  7 tablet  0  . guaiFENesin (MUCINEX) 600 MG 12 hr tablet Take 2 tablets (1,200 mg total) by mouth 2 (two) times daily.  30 tablet  0  . HYDROcodone-acetaminophen (NORCO/VICODIN) 5-325 MG per tablet Take 1 tablet by mouth every 8 (eight) hours as needed for moderate pain.      . montelukast (SINGULAIR) 10 MG tablet Take 10 mg by mouth at bedtime.      . nitroGLYCERIN (NITROSTAT) 0.4 MG SL tablet Place 0.4 mg under the tongue every 5 (five) minutes as needed. For chest pain      . nystatin (MYCOSTATIN) 100000 UNIT/ML suspension Swish and swallow 4 mls three times daily X 5 days  60 mL  0  . omeprazole (PRILOSEC) 20 MG capsule Take 20 mg by mouth 2 (two) times daily.        Marland Kitchen  OXYGEN-HELIUM IN Inhale 4 L into the lungs continuous.      . prasugrel (EFFIENT) 10 MG TABS tablet Take 1 tablet (10 mg total) by mouth daily.  30 tablet  11  . tiotropium (SPIRIVA) 18 MCG inhalation capsule Place 18 mcg into inhaler and inhale daily.       No current facility-administered medications for this visit.    Allergies  Allergen Reactions  . Amiodarone Other (See Comments)    Drops HR too low, SOB, fatigue  . Clopidogrel Bisulfate Hives  . Dilaudid [Hydromorphone Hcl] Other (See Comments)    "I couldn't function for 2 days."  . Multaq [Dronedarone] Other (See Comments)    Heart races     History   Social History  . Marital Status:  Married    Spouse Name: N/A    Number of Children: 2  . Years of Education: N/A   Occupational History  . disabled     back problems   Social History Main Topics  . Smoking status: Former Smoker -- 1.50 packs/day for 30 years    Types: Cigarettes    Quit date: 05/24/2004  . Smokeless tobacco: Never Used  . Alcohol Use: No  . Drug Use: Yes    Special: Flunitrazepam  . Sexual Activity: Not on file   Other Topics Concern  . Not on file   Social History Narrative   Married with 2 children, husband has Alzheimer's dementia, retired now, previously Corporate treasurer, Quit smoking in [place]: General: negative for chills, fever, night sweats or weight changes.  Cardiovascular: negative for chest pain, dyspnea on exertion, edema, orthopnea, palpitations, paroxysmal nocturnal dyspnea or shortness of breath Dermatological: negative for rash Respiratory: negative for cough or wheezing Urologic: negative for hematuria Abdominal: negative for nausea, vomiting, diarrhea, bright red blood per rectum, melena, or hematemesis Neurologic: negative for visual changes, syncope, or dizziness All other systems reviewed and are otherwise negative except as noted above.    Blood pressure 100/80, pulse 76, height 5\' 7"  (1.702 m), weight 94 lb (42.638 kg).  General appearance: alert and no distress Neck: no adenopathy, no carotid bruit, no JVD, supple, symmetrical, trachea midline and thyroid not enlarged, symmetric, no tenderness/mass/nodules Lungs: clear to auscultation bilaterally Heart: regular rate and rhythm, S1, S2 normal, no murmur, click, rub or gallop Extremities: extremities normal, atraumatic, no cyanosis or edema y EKG not performed today  ASSESSMENT AND PLAN:   Acute and chronic respiratory failure She was just hospitalized several weeks ago on the hospitalist service. Cardiology was consulted. Her problems are multifactorial including stage IIIB lung  cancer, COPD and moderate LV dysfunction.  Hypertension Her current medications are relatively hypotensive today for her blood pressures have been in this range for quite a while  CAD (coronary artery disease), autologous vein bypass graft, cath 01/12/13 with Xience DES to VG-RCA History of CAD status post bypass graft in 1995 with multiple interventions since. She was last cathed in August of last year and had an RCA SVG stent placed. She has moderate LV dysfunction with an ejection fraction of 45%. She denies chest pain but is chronically short of breath and currently on 4 L of oxygen.      Lorretta Harp MD FACP,FACC,FAHA, [facility] 08/07/2013 11:39 AM

## 2013-08-07 NOTE — Assessment & Plan Note (Signed)
History of CAD status post bypass graft in 1995 with multiple interventions since. She was last cathed in August of last year and had an RCA SVG stent placed. She has moderate LV dysfunction with an ejection fraction of 45%. She denies chest pain but is chronically short of breath and currently on 4 L of oxygen.

## 2013-08-07 NOTE — Patient Instructions (Signed)
Your physician wants you to follow-up in: 6 months with an extender and 1 year with Dr Gwenlyn Found. You will receive a reminder letter in the mail two months in advance. If you don't receive a letter, please call our office to schedule the follow-up appointment.

## 2013-08-07 NOTE — Assessment & Plan Note (Signed)
Her current medications are relatively hypotensive today for her blood pressures have been in this range for quite a while

## 2013-08-08 ENCOUNTER — Other Ambulatory Visit: Payer: Self-pay | Admitting: Pulmonary Disease

## 2013-08-14 ENCOUNTER — Ambulatory Visit: Payer: Medicaid Other | Admitting: Cardiovascular Disease

## 2013-09-11 ENCOUNTER — Ambulatory Visit
Admission: RE | Admit: 2013-09-11 | Discharge: 2013-09-11 | Disposition: A | Discharge status: Home / Self Care | Payer: Medicaid Other | Source: Ambulatory Visit | Attending: Physical Medicine and Rehabilitation | Admitting: Physical Medicine and Rehabilitation

## 2013-09-11 ENCOUNTER — Other Ambulatory Visit: Payer: Self-pay | Admitting: Physical Medicine and Rehabilitation

## 2013-09-11 DIAGNOSIS — M546 Pain in thoracic spine: Secondary | ICD-10-CM

## 2013-09-11 DIAGNOSIS — M545 Low back pain, unspecified: Secondary | ICD-10-CM

## 2013-09-11 DIAGNOSIS — M961 Postlaminectomy syndrome, not elsewhere classified: Secondary | ICD-10-CM

## 2013-09-11 DIAGNOSIS — G894 Chronic pain syndrome: Secondary | ICD-10-CM

## 2013-09-17 ENCOUNTER — Telehealth: Payer: Self-pay | Admitting: Internal Medicine

## 2013-09-17 NOTE — Telephone Encounter (Signed)
sw. pt and r/s appt due to MD out of the office...pt ok and aware of new d.t

## 2013-10-02 ENCOUNTER — Other Ambulatory Visit: Payer: Self-pay | Admitting: Pulmonary Disease

## 2013-10-05 ENCOUNTER — Ambulatory Visit (HOSPITAL_COMMUNITY)
Admission: RE | Admit: 2013-10-05 | Discharge: 2013-10-05 | Disposition: A | Discharge status: Home / Self Care | Payer: Medicaid Other | Source: Ambulatory Visit | Attending: Internal Medicine | Admitting: Internal Medicine

## 2013-10-05 ENCOUNTER — Other Ambulatory Visit (HOSPITAL_BASED_OUTPATIENT_CLINIC_OR_DEPARTMENT_OTHER): Payer: Medicaid Other

## 2013-10-05 ENCOUNTER — Encounter (INDEPENDENT_AMBULATORY_CARE_PROVIDER_SITE_OTHER): Payer: Self-pay

## 2013-10-05 ENCOUNTER — Encounter (HOSPITAL_COMMUNITY): Payer: Self-pay

## 2013-10-05 DIAGNOSIS — C3410 Malignant neoplasm of upper lobe, unspecified bronchus or lung: Secondary | ICD-10-CM | POA: Insufficient documentation

## 2013-10-05 LAB — COMPREHENSIVE METABOLIC PANEL (CC13)
ALT: 13 U/L (ref 0–55)
ANION GAP: 11 meq/L (ref 3–11)
AST: 16 U/L (ref 5–34)
Albumin: 4.1 g/dL (ref 3.5–5.0)
Alkaline Phosphatase: 97 U/L (ref 40–150)
BUN: 11.5 mg/dL (ref 7.0–26.0)
CO2: 27 meq/L (ref 22–29)
CREATININE: 0.7 mg/dL (ref 0.6–1.1)
Calcium: 10.3 mg/dL (ref 8.4–10.4)
Chloride: 104 mEq/L (ref 98–109)
Glucose: 85 mg/dl (ref 70–140)
Potassium: 4.2 mEq/L (ref 3.5–5.1)
Sodium: 142 mEq/L (ref 136–145)
Total Bilirubin: 0.3 mg/dL (ref 0.20–1.20)
Total Protein: 7.1 g/dL (ref 6.4–8.3)

## 2013-10-05 LAB — CBC WITH DIFFERENTIAL/PLATELET
BASO%: 0.6 % (ref 0.0–2.0)
Basophils Absolute: 0 10*3/uL (ref 0.0–0.1)
EOS ABS: 0 10*3/uL (ref 0.0–0.5)
EOS%: 0.6 % (ref 0.0–7.0)
HEMATOCRIT: 39.7 % (ref 34.8–46.6)
HEMOGLOBIN: 13.4 g/dL (ref 11.6–15.9)
LYMPH%: 12.7 % — ABNORMAL LOW (ref 14.0–49.7)
MCH: 32.6 pg (ref 25.1–34.0)
MCHC: 33.7 g/dL (ref 31.5–36.0)
MCV: 96.7 fL (ref 79.5–101.0)
MONO#: 0.5 10*3/uL (ref 0.1–0.9)
MONO%: 7 % (ref 0.0–14.0)
NEUT%: 79.1 % — AB (ref 38.4–76.8)
NEUTROS ABS: 5.6 10*3/uL (ref 1.5–6.5)
Platelets: 259 10*3/uL (ref 145–400)
RBC: 4.11 10*6/uL (ref 3.70–5.45)
RDW: 13.3 % (ref 11.2–14.5)
WBC: 7 10*3/uL (ref 3.9–10.3)
lymph#: 0.9 10*3/uL (ref 0.9–3.3)

## 2013-10-05 MED ORDER — IOHEXOL 300 MG/ML  SOLN
80.0000 mL | Freq: Once | INTRAMUSCULAR | Status: AC | PRN
  Administered 2013-10-05: 80 mL via INTRAVENOUS

## 2013-10-09 ENCOUNTER — Other Ambulatory Visit: Payer: Self-pay | Admitting: *Deleted

## 2013-10-09 MED ORDER — ATORVASTATIN CALCIUM 80 MG PO TABS: 80.0000 mg | ORAL_TABLET | Freq: Every day | ORAL | Status: DC

## 2013-10-09 NOTE — Telephone Encounter (Signed)
Rx refill sent to patient pharmacy   

## 2013-10-11 ENCOUNTER — Ambulatory Visit: Payer: Medicaid Other | Admitting: Internal Medicine

## 2013-10-16 ENCOUNTER — Telehealth: Payer: Self-pay | Admitting: Pulmonary Disease

## 2013-10-16 MED ORDER — DOXYCYCLINE HYCLATE 100 MG PO TABS: 100.0000 mg | ORAL_TABLET | Freq: Two times a day (BID) | ORAL | Status: DC

## 2013-10-16 MED ORDER — PREDNISONE 10 MG PO TABS: ORAL_TABLET | ORAL | Status: DC

## 2013-10-16 NOTE — Telephone Encounter (Signed)
lmomtcb x1 

## 2013-10-16 NOTE — Telephone Encounter (Signed)
Spoke with pt. Reports cough and SOB x2 days. Mucus is yellow in color. Sinus congestion is also present. Would like antibiotic and prednisone sent in.  VS - please advise. Thanks.

## 2013-10-16 NOTE — Telephone Encounter (Signed)
Please call in script for doxycycline 100 mg pill >> one pill bid for 7 days, dispense 14 with no refills.  Please also call script for prednisone 10 mg pills >> 3 pills for 2 days, 2 pills for 2 days, 1 pill for 2 days, dispense 12 with no refills.  Advise her to call and schedule ROV if she is not improved with these therapies.

## 2013-10-16 NOTE — Telephone Encounter (Signed)
ATC x 2 mobile # - busy signal  ATC x 1 home # - busy signal  wcb

## 2013-10-16 NOTE — Telephone Encounter (Signed)
Pt is aware of VS's recs. Rx's have been sent in. Nothing further is needed.

## 2013-10-17 ENCOUNTER — Encounter: Payer: Self-pay | Admitting: Internal Medicine

## 2013-10-17 ENCOUNTER — Ambulatory Visit (HOSPITAL_BASED_OUTPATIENT_CLINIC_OR_DEPARTMENT_OTHER): Payer: Medicaid Other | Admitting: Internal Medicine

## 2013-10-17 VITALS — BP 106/55 | HR 81 | Temp 98.7°F | Resp 18 | Ht 67.0 in | Wt 94.2 lb

## 2013-10-17 DIAGNOSIS — R0989 Other specified symptoms and signs involving the circulatory and respiratory systems: Secondary | ICD-10-CM

## 2013-10-17 DIAGNOSIS — R059 Cough, unspecified: Secondary | ICD-10-CM

## 2013-10-17 DIAGNOSIS — C3410 Malignant neoplasm of upper lobe, unspecified bronchus or lung: Secondary | ICD-10-CM

## 2013-10-17 DIAGNOSIS — R05 Cough: Secondary | ICD-10-CM

## 2013-10-17 DIAGNOSIS — R0609 Other forms of dyspnea: Secondary | ICD-10-CM

## 2013-10-17 DIAGNOSIS — IMO0002 Reserved for concepts with insufficient information to code with codable children: Secondary | ICD-10-CM

## 2013-10-17 NOTE — Progress Notes (Signed)
[facility] [phone]   [phone]  OFFICE PROGRESS NOTE  Tivis Ringer, MD [place], [place]. [place] [address]  DIAGNOSIS: Stage IIIA (T2a, N2, MX) non-small cell lung cancer, squamous cell carcinoma diagnosed in September of 2013.   PRIOR THERAPY: Status post a course of concurrent chemoradiation with chemotherapy in the form of weekly carboplatin for AUC of 2 and paclitaxel at 45 mg/m2. The paclitaxel was discontinued after cycle #5 secondary to peripheral neuropathy. Last dose was given on 04/10/2012 with partial response.  CURRENT THERAPY: None  INTERVAL HISTORY: [name] 63 y.o. female returns to the clinic today for follow up visit. The patient continues to have shortness of breath with exertion and mild cough. She denied having any significant chest pain or hemoptysis. The patient denied having any significant weight loss or night sweats. She has no nausea or vomiting. She is under a lot of stress recently after her husband was diagnosed with stroke and she is taking care of him at home. She mentions that she quit smoking few months ago. She had repeat CT scan of the chest performed recently and she is here for evaluation and discussion of her scan results.   MEDICAL HISTORY: Past Medical History  Diagnosis Date  . Acute myocardial infarction, unspecified site, episode of care unspecified 2011    VF arrest. Ruled in, cath with no clear culprit lesion, inferior WMA on LV gram, medical therapy  . Esophageal dysmotility   . Hemorrhoids   . Esophageal stricture   . Diaphragmatic hernia without mention of obstruction or gangrene   . 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 CAD (coronary artery disease)     CABG'96., Xience DES -VG-RCA (01/12/13)  . Peripheral neuropathy   . Hyperlipidemia   . Peptic ulcer, unspecified site, unspecified as acute or chronic, without mention of  hemorrhage, perforation, or obstruction   . Depressive disorder, not elsewhere classified   . Anxiety state, unspecified   . COPD (chronic obstructive pulmonary disease)   . GERD (gastroesophageal reflux disease)   . Candida esophagitis     on multiple occasions  . C. difficile diarrhea   . PVD (peripheral vascular disease)   . Headache(784.0)   . Weight loss 06/13/2011    scheduled here by family doctor  . CHF (congestive heart failure) 2011  . Asthma   . Hypertension   . History of nuclear stress test 07/14/2010    dipyriadmole; normal pattern of perfusion, low risk   . History of tobacco use     quit 05/2009  . Non-small cell carcinoma of lung, stage 3     Dr. Roxan Hockey  . History of radiation therapy 02/28/2012-04/14/2012    right lung    ALLERGIES:  is allergic to amiodarone; clopidogrel bisulfate; dilaudid; and multaq.  MEDICATIONS:  Current Outpatient Prescriptions  Medication Sig Dispense Refill  . albuterol (PROVENTIL HFA;VENTOLIN HFA) 108 (90 BASE) MCG/ACT inhaler Inhale 2 puffs into the lungs every 4 (four) hours as needed for wheezing or shortness of breath.      Marland Kitchen aspirin 81 MG EC tablet Take 81 mg by mouth daily.        Marland Kitchen atorvastatin (LIPITOR) 80 MG tablet Take 1 tablet (80 mg total) by mouth at bedtime.  30 tablet  5  . budesonide-formoterol (SYMBICORT) 160-4.5 MCG/ACT inhaler Inhale 2 puffs into the lungs 2 (two) times daily.      Marland Kitchen  Cholecalciferol (VITAMIN D-3) 5000 UNITS TABS Take 1 tablet by mouth daily.      . diazepam (VALIUM) 5 MG tablet Take 5 mg by mouth 2 (two) times daily.      Marland Kitchen doxycycline (VIBRA-TABS) 100 MG tablet Take 1 tablet (100 mg total) by mouth 2 (two) times daily.  14 tablet  0  . ezetimibe (ZETIA) 10 MG tablet Take 10 mg by mouth daily.        . famotidine (PEPCID) 20 MG tablet Take 20 mg by mouth at bedtime.      . fluconazole (DIFLUCAN) 100 MG tablet TAKE 1 TABLET BY MOUTH EVERY DAY  7 tablet  0  . guaiFENesin (MUCINEX) 600 MG 12 hr  tablet Take 2 tablets (1,200 mg total) by mouth 2 (two) times daily.  30 tablet  0  . HYDROcodone-acetaminophen (NORCO/VICODIN) 5-325 MG per tablet Take 1 tablet by mouth every 8 (eight) hours as needed for moderate pain.      . montelukast (SINGULAIR) 10 MG tablet Take 10 mg by mouth at bedtime.      . nitroGLYCERIN (NITROSTAT) 0.4 MG SL tablet Place 0.4 mg under the tongue every 5 (five) minutes as needed. For chest pain      . nystatin (MYCOSTATIN) 100000 UNIT/ML suspension Swish and swallow 4 mls three times daily X 5 days  60 mL  0  . omeprazole (PRILOSEC) 20 MG capsule Take 20 mg by mouth 2 (two) times daily.        . OXYGEN-HELIUM IN Inhale 4 L into the lungs continuous.      . prasugrel (EFFIENT) 10 MG TABS tablet Take 1 tablet (10 mg total) by mouth daily.  30 tablet  11  . predniSONE (DELTASONE) 10 MG tablet 3 pills for 2 days, 2 pills for 2 days, 1 pill for 2 days  12 tablet  0  . PROAIR HFA 108 (90 BASE) MCG/ACT inhaler INHALE 2 PUFFS INTO THE LUNGS EVERY 6 HOURS AS NEEDED FOR WHEEZING.  8.5 each  5  . SPIRIVA HANDIHALER 18 MCG inhalation capsule PLACE 1 CAPSULE INTO INHALER AND INHALE CONTENTS INTO THE LUNGS ONCE DAILY  30 capsule  5  . tiotropium (SPIRIVA) 18 MCG inhalation capsule Place 18 mcg into inhaler and inhale daily.       No current facility-administered medications for this visit.    SURGICAL HISTORY:  Past Surgical History  Procedure Laterality Date  . Tonsillectomy  1974  . Fracture lt jaw  1976  . Breast surgery  1969, 1970    bilateral  . Epigastric hernia repair  2008    Dr Roxan Hockey  . Coronary artery bypass graft  02/01/1994    left internal mammary to LAD, vein graft to diagonal/post descending, post lateral, marginals, circumfle, diagonal  . Carpal tunnel release    . Total abdominal hysterectomy  1998  . Laminectomy    . Subclavian vein angioplasty / stenting Left   . Transthoracic echocardiogram  08/14/2009    EF=>55%, normal LV systolic function,  mild hypokinesis of basal segments of inferolateral & anterolater walls; mild MR/TR; AV mildly sclerotic  . Coronary angioplasty with stent placement  08/29/2000    stent to RCA (Dr. Corky Downs)  . Cardiac catheterization  01/17/2002    patent grafts (Dr. Marella Chimes)  . Cardiac catheterization  02/13/2004    significant L main & 3 vessel CAD, patents grafts, mild distal aortic disease (Dr. Gerrie Nordmann)  . Cardiac catheterization  07/14/2005  patent grafts, normal LV function (Dr. Adora Fridge)  . Cardiac catheterization  09/26/2008    patent grafts, normal LV function (Dr. Adora Fridge)  . Cardiac catheterization  05/31/2009    patent grafts, inferior wall motion abnormality (Dr. Adora Fridge)  . Cardiac catheterization  10/03/2009    normal LV function; 100% native RCA, circumflex, distal LAD occlusion  . Coronary angioplasty with stent placement  01/12/2013    Xience DES to SVG-RCA distal  . Carotid doppler  08/14/2009    R & L ICAs - 0-49% diameter reduction     REVIEW OF SYSTEMS:  A comprehensive review of systems was negative except for: Respiratory: positive for cough and dyspnea on exertion   PHYSICAL EXAMINATION: General appearance: alert, cooperative and no distress Head: Normocephalic, without obvious abnormality, atraumatic Neck: no adenopathy, no JVD and thyroid not enlarged, symmetric, no tenderness/mass/nodules Lymph nodes: Cervical, supraclavicular, and axillary nodes normal. Resp: rhonchi bilaterally and wheezes bilaterally Cardio: regular rate and rhythm, S1, S2 normal, no murmur, click, rub or gallop GI: soft, non-tender; bowel sounds normal; no masses,  no organomegaly Extremities: extremities normal, atraumatic, no cyanosis or edema  ECOG PERFORMANCE STATUS: 1 - Symptomatic but completely ambulatory  There were no vitals taken for this visit.  LABORATORY DATA: Lab Results  Component Value Date   WBC 7.0 10/05/2013   HGB 13.4 10/05/2013   HCT 39.7 10/05/2013   MCV 96.7 10/05/2013    PLT 259 10/05/2013      Chemistry      Component Value Date/Time   NA 142 10/05/2013 0906   NA 140 07/21/2013 0255   K 4.2 10/05/2013 0906   K 4.1 07/21/2013 0255   CL 101 07/21/2013 0255   CL 104 11/07/2012 0839   CO2 27 10/05/2013 0906   CO2 27 07/21/2013 0255   BUN 11.5 10/05/2013 0906   BUN 10 07/21/2013 0255   CREATININE 0.7 10/05/2013 0906   CREATININE 0.60 07/21/2013 0255      Component Value Date/Time   CALCIUM 10.3 10/05/2013 0906   CALCIUM 8.8 07/21/2013 0255   ALKPHOS 97 10/05/2013 0906   ALKPHOS 106 02/07/2012 1227   AST 16 10/05/2013 0906   AST 18 02/07/2012 1227   ALT 13 10/05/2013 0906   ALT 13 02/07/2012 1227   BILITOT 0.30 10/05/2013 0906   BILITOT 0.5 02/07/2012 1227       RADIOGRAPHIC STUDIES: Ct Chest W Contrast  10/05/2013   CLINICAL DATA:  Lung cancer  EXAM: CT CHEST WITH CONTRAST  TECHNIQUE: Multidetector CT imaging of the chest was performed during intravenous contrast administration.  CONTRAST:  16mL OMNIPAQUE IOHEXOL 300 MG/ML  SOLN  COMPARISON:  06/05/2013  FINDINGS: Soft tissue / Mediastinum: No axillary lymphadenopathy. 6 mm right thyroid nodule is stable. There is no mediastinal or hilar lymphadenopathy. Heart size is normal. No pericardial effusion.  Lungs / Pleura: Lung windows show emphysema. Previously described spiculated right upper lobe nodule is stable measuring 10 x 7 mm today compared to 11 x 7 mm previously. Cranial to this nodule, along the upper aspect of the right major fissure is the subpleural density described previously. This is unchanged measuring 8 x 17 mm today compared to 7 x 17 mm previously.  4 mm right lower lobe pulmonary nodule on image 34 is unchanged. The 4 mm right middle lobe pulmonary nodules seen previously is no longer evident and has resolved in the interval. Multiple scattered small left lower lobe pulmonary nodule seen on the previous  study have also resolved. There is some subsegmental atelectasis in the posterior left lower lobe.   Bones: Patient is status post thoracolumbar fusion, incompletely visualized. Evidence of previous median sternotomy.  Upper Abdomen: No abnormality within the visualized portion of the upper abdomen.  IMPRESSION: Stable appearance of the 11 x 7 mm spiculated right upper lobe nodule as well as the subpleural density along this cranial aspect of the right major fissure.  The ill-defined bilateral upper lobe ground-glass nodule seen on the previous study have resolved in the interval. This likely represents resolution of previous infectious/inflammatory process.   Electronically Signed   By: Misty Stanley M.D.   On: 10/05/2013 15:13   ASSESSMENT AND PLAN: this is a very pleasant 63 years old white female with history of stage IIIA non-small cell lung cancer status post concurrent chemoradiation with weekly carboplatin and paclitaxel. She has been observation since November of 2013 with no evidence for disease progression. I discussed the scan results with the patient and recommended for her to continue on observation with repeat CT scan of the chest in 6 months.  She was advised to call immediately if she has any concerning symptoms in the interval.   The patient voices understanding of current disease status and treatment options and is in agreement with the current care plan.  All questions were answered. The patient knows to call the clinic with any problems, questions or concerns. We can certainly see the patient much sooner if necessary.  Disclaimer: This note was dictated with voice recognition software. Similar sounding words can inadvertently be transcribed and may not be corrected upon review.

## 2013-10-18 ENCOUNTER — Telehealth: Payer: Self-pay | Admitting: Internal Medicine

## 2013-10-18 NOTE — Telephone Encounter (Signed)
cld pt & spoke w/Phillip Frane(son) & gave son pt next appt date & time. Will mail copy out as remimd

## 2013-10-24 ENCOUNTER — Other Ambulatory Visit: Payer: Self-pay | Admitting: Internal Medicine

## 2013-10-24 DIAGNOSIS — Z1231 Encounter for screening mammogram for malignant neoplasm of breast: Secondary | ICD-10-CM

## 2013-10-29 ENCOUNTER — Other Ambulatory Visit: Payer: Self-pay | Admitting: Pulmonary Disease

## 2013-11-01 ENCOUNTER — Ambulatory Visit: Payer: Medicaid Other

## 2013-11-08 ENCOUNTER — Telehealth: Payer: Self-pay | Admitting: Pulmonary Disease

## 2013-11-08 MED ORDER — NYSTATIN 100000 UNIT/ML MT SUSP: OROMUCOSAL | Status: DC

## 2013-11-08 NOTE — Telephone Encounter (Signed)
Send script for nystatin swish and swallow 100,000 units four times per day for 5 days.

## 2013-11-08 NOTE — Telephone Encounter (Signed)
Offer diflucan 150 mg, # 4, 1 daily. See if it the thrush clears and stays gone. Otherwise discuss with Dr Halford Chessman next week.

## 2013-11-08 NOTE — Telephone Encounter (Signed)
Please send order for nystatin swish and swallow

## 2013-11-08 NOTE — Telephone Encounter (Signed)
Called spoke with pt. She c/o thrush all inside her mouth. She is rinsing after using her inhaler and also uses a spacer.  Pt spouse passed away last week. Please advise Dr. Halford Chessman thanks  Allergies  Allergen Reactions  . Amiodarone Other (See Comments)    Drops HR too low, SOB, fatigue  . Clopidogrel Bisulfate Hives  . Dilaudid [Hydromorphone Hcl] Other (See Comments)    "I couldn't function for 2 days."  . Multaq [Dronedarone] Other (See Comments)    Heart races

## 2013-11-08 NOTE — Telephone Encounter (Signed)
VS is off this afternoon. Please advise Dr. Annamaria Boots thanks

## 2013-11-08 NOTE — Telephone Encounter (Signed)
Rx was sent to pharm  Pt aware  Nothing further needed

## 2013-11-17 ENCOUNTER — Other Ambulatory Visit: Payer: Self-pay | Admitting: Pulmonary Disease

## 2013-11-19 ENCOUNTER — Other Ambulatory Visit: Payer: Self-pay | Admitting: Pulmonary Disease

## 2013-11-20 ENCOUNTER — Other Ambulatory Visit: Payer: Self-pay | Admitting: Internal Medicine

## 2013-11-26 ENCOUNTER — Telehealth: Payer: Self-pay | Admitting: Pulmonary Disease

## 2013-11-26 MED ORDER — FLUCONAZOLE 100 MG PO TABS: 100.0000 mg | ORAL_TABLET | Freq: Every day | ORAL | Status: DC

## 2013-11-26 NOTE — Telephone Encounter (Signed)
Called spoke with patient who reported that she has 1 dose left of Nystatin suspension but she is still having issues with thrush >> white patches on the back of her throat, her tongue is "raw" and she is now getting sores on her lips.  Pt is requesting Diflucan tabs (last rx was done 3.11.15 for #7 tabs, Diflucan 100mg ).    VS is scheduled off until 7/9 Pt has upcoming appt with VS 11/08/13  Dr Elsworth Soho, would you mind advising on rx?  Thank you!  Allergies  Allergen Reactions  . Amiodarone Other (See Comments)    Drops HR too low, SOB, fatigue  . Clopidogrel Bisulfate Hives  . Dilaudid [Hydromorphone Hcl] Other (See Comments)    "I couldn't function for 2 days."  . Multaq [Dronedarone] Other (See Comments)    Heart races

## 2013-11-26 NOTE — Telephone Encounter (Signed)
lmomtcb for pt and was unable to leave msg on the alternate # provided by pt.  Will need to let her know to diflucan rx was sent to [facility] and to call office back if she has muscle aches or pains.  Diflucan rx was sent to [facility] [place] - received override warning between diflucan and statins.  Spoke with Dr. Elsworth Soho, ok to proceed with sending diflucan rx but inform pt to call back if she has muscle aches or pains.

## 2013-11-26 NOTE — Telephone Encounter (Signed)
Diflucan 100 daily x 5

## 2013-11-26 NOTE — Telephone Encounter (Signed)
Pt states that she has already picked up Rx Aware to contact our office if any side effects of incr joint aches/pains Nothing further needed.

## 2013-11-26 NOTE — Telephone Encounter (Signed)
Pt returning call.[name] ° °

## 2013-11-26 NOTE — Telephone Encounter (Signed)
ATC pt at home number, line rang multiple times with no answer and no option to LM.  ATC pt at alternate number listed; an automated message saying that "The wireless customer you are trying to reach is not available.  Please try your call again later."  WCB.

## 2013-12-06 ENCOUNTER — Ambulatory Visit
Admission: RE | Admit: 2013-12-06 | Discharge: 2013-12-06 | Disposition: A | Discharge status: Home / Self Care | Payer: Medicaid Other | Source: Ambulatory Visit | Attending: Internal Medicine | Admitting: Internal Medicine

## 2013-12-06 DIAGNOSIS — Z1231 Encounter for screening mammogram for malignant neoplasm of breast: Secondary | ICD-10-CM

## 2013-12-19 ENCOUNTER — Other Ambulatory Visit: Payer: Self-pay | Admitting: Pulmonary Disease

## 2013-12-19 NOTE — Telephone Encounter (Signed)
LMTCB x 1  Need to know if patient is having flare of thrush. Refill request received electronically for NYSTATIN (MYCOSTATIN) 100,000 UNIT/ML suspension

## 2013-12-25 ENCOUNTER — Telehealth: Payer: Self-pay | Admitting: Internal Medicine

## 2013-12-25 NOTE — Telephone Encounter (Signed)
Spoke with patient and she states she feels full after a few bites. She feels like the food just does not go down. Reports weight loss. These symptoms have been going on for awhile now. Scheduled patient on 01/01/14 at 1:30 PM with Nicoletta Ba, PA.

## 2013-12-31 ENCOUNTER — Encounter: Payer: Self-pay | Admitting: Pulmonary Disease

## 2013-12-31 ENCOUNTER — Ambulatory Visit (INDEPENDENT_AMBULATORY_CARE_PROVIDER_SITE_OTHER): Payer: Medicaid Other | Admitting: Pulmonary Disease

## 2013-12-31 VITALS — BP 110/68 | HR 65 | Temp 98.1°F | Ht 66.0 in | Wt 95.6 lb

## 2013-12-31 DIAGNOSIS — J9620 Acute and chronic respiratory failure, unspecified whether with hypoxia or hypercapnia: Secondary | ICD-10-CM

## 2013-12-31 DIAGNOSIS — B370 Candidal stomatitis: Secondary | ICD-10-CM

## 2013-12-31 DIAGNOSIS — J439 Emphysema, unspecified: Secondary | ICD-10-CM

## 2013-12-31 DIAGNOSIS — J438 Other emphysema: Secondary | ICD-10-CM

## 2013-12-31 MED ORDER — FLUCONAZOLE 100 MG PO TABS: 100.0000 mg | ORAL_TABLET | Freq: Every day | ORAL | Status: DC

## 2013-12-31 MED ORDER — NYSTATIN 100000 UNIT/ML MT SUSP: 500000.0000 [IU] | Freq: Four times a day (QID) | OROMUCOSAL | Status: DC

## 2013-12-31 MED ORDER — UMECLIDINIUM-VILANTEROL 62.5-25 MCG/INH IN AEPB: 1.0000 | INHALATION_SPRAY | Freq: Every day | RESPIRATORY_TRACT | Status: DC

## 2013-12-31 NOTE — Patient Instructions (Signed)
Anoro one puff daily Stop spiriva and symbicort while using anoro Nystatin 5 ml swish and swallow four times daily for 5 days Diflucan 100 mg daily until script finished Follow up in 6 months

## 2013-12-31 NOTE — Progress Notes (Signed)
Chief Complaint  Patient presents with  . Follow-up    Pt c/o thrush symptoms-- using spacer with inhaler and rinsing after use. NO improvement. Denies increased cough and SOB.     History of Present Illness: [name] is a 63 y.o. female former smoker with COPD and chronic hypoxic respiratory failure.  She has hx of Stage IIIA NSCLC s/p chemoradiation therapy.  She has been feeling more depressed since her husband died.  It has been a struggle, but she is trying to cope the best she can.  She has noticed more whitish build up in her throat again.  Her breathing has been stable otherwise.  She is not having much cough or wheeze.  She denies chest pain, fever, or hemoptysis.  She was seen by Dr. Julien Nordmann in May and advised for continued clinical observation.   TESTS: PFT 06/15/10 >> FEV1 1.54 (61%), FEV1% 55, TLC 5.07 (95%), DLCO 62%, no BD A1AT 10/27/11 >> 175 (nl 83 - 199), MM  PMHx, PSHx, Medications, Allergies, Fhx, Shx reviewed.  Physical Exam:  General - No distress, thin, wearing oxygen ENT - No sinus tenderness,no oral exudate, no LAN Cardiac - s1s2 regular, no murmur Chest - Prolonged exhalation, no wheeze/rales/dullness Back - No focal tenderness Abd - Soft, non-tender Ext - No edema Neuro - Normal strength Skin - No rashes Psych - normal mood, and behavior   Assessment/Plan:  Whitney Mires, MD [facility] Pager:  [phone]

## 2014-01-01 ENCOUNTER — Ambulatory Visit (INDEPENDENT_AMBULATORY_CARE_PROVIDER_SITE_OTHER): Payer: Medicaid Other | Admitting: Physician Assistant

## 2014-01-01 ENCOUNTER — Encounter: Payer: Self-pay | Admitting: Physician Assistant

## 2014-01-01 VITALS — BP 94/52 | HR 66 | Ht 66.0 in | Wt 94.8 lb

## 2014-01-01 DIAGNOSIS — Z8719 Personal history of other diseases of the digestive system: Secondary | ICD-10-CM

## 2014-01-01 DIAGNOSIS — Z85118 Personal history of other malignant neoplasm of bronchus and lung: Secondary | ICD-10-CM

## 2014-01-01 DIAGNOSIS — R1314 Dysphagia, pharyngoesophageal phase: Secondary | ICD-10-CM

## 2014-01-01 NOTE — Progress Notes (Signed)
Reviewed and agree with Barium esophagram first. Also , could she have Candida?  Will see.Marland KitchenMarland Kitchen

## 2014-01-01 NOTE — Progress Notes (Signed)
Subjective:    Patient ID: [name], female    DOB: 1950/12/15, 63 y.o.   MRN: [id number]  HPI [name] is a 63 year old white female known to Dr. Delfin Edis. She has multiple serious medical problems including history of chronic respiratory failure/COPD. She is oxygen dependent on 4 L nasal cannula. She has history of coronary artery disease is status post MI in August of 2014. She had coronary stenting done at that time and has been on Effient . She also has an ischemic cardiomyopathy with EF of about 45%, history of peripheral vascular disease peripheral neuropathy, and was diagnosed with squamous cell lung CA in 2013. She was treated with chemotherapy and has been doing well. Patient comes in today with complaints of dysphagia. She says she's had symptoms for about 6 months, somewhat progressive. She says she has to take very tiny bites of everything that she eats and feels like she eats a few bites and and the food just" sits "" in her chest. Eventually food and liquids will go on down but she frequently has to stop eating. She's not having any regurgitation. She denies any odynophagia. No heartburn or indigestion. She has had some left-sided abdominal discomfort which is usually relieved with a bowel movement. She has chronic constipation and uses MiraLax which is effective. She has had some recent weight loss but has been grieving as her husband passed away in 2022-11-17 of this year. Over the past 2-3 years she is lost a total of 40+ pounds. Patient does have prior history of an esophageal stricture and is felt to have esophageal dysmotility. She had EGD done in July of 2013 per Dr. Olevia Perches she was found to have mild gastritis, there was no evidence of esophageal stricture and she did not require dilation.      Review of Systems  Constitutional: Negative.   HENT: Positive for trouble swallowing.   Eyes: Negative.   Respiratory: Positive for shortness of breath.   Cardiovascular: Negative.     Gastrointestinal: Negative.   Endocrine: Negative.   Genitourinary: Negative.   Musculoskeletal: Negative.   Allergic/Immunologic: Negative.   Neurological: Negative.   Hematological: Negative.   Psychiatric/Behavioral: Negative.    Outpatient Prescriptions Prior to Visit  Medication Sig Dispense Refill  . aspirin 81 MG EC tablet Take 81 mg by mouth daily.        Marland Kitchen atorvastatin (LIPITOR) 80 MG tablet Take 1 tablet (80 mg total) by mouth at bedtime.  30 tablet  5  . Cholecalciferol (VITAMIN D-3) 5000 UNITS TABS Take 1 tablet by mouth daily.      . diazepam (VALIUM) 5 MG tablet Take 5 mg by mouth 2 (two) times daily.      Marland Kitchen ezetimibe (ZETIA) 10 MG tablet Take 10 mg by mouth daily.        . famotidine (PEPCID) 20 MG tablet Take 20 mg by mouth at bedtime.      . fluconazole (DIFLUCAN) 100 MG tablet Take 1 tablet (100 mg total) by mouth daily.  5 tablet  0  . guaiFENesin (MUCINEX) 600 MG 12 hr tablet Take 2 tablets (1,200 mg total) by mouth 2 (two) times daily.  30 tablet  0  . HYDROcodone-acetaminophen (NORCO/VICODIN) 5-325 MG per tablet Take 1 tablet by mouth every 8 (eight) hours as needed for moderate pain.      . montelukast (SINGULAIR) 10 MG tablet Take 10 mg by mouth at bedtime.      . nitroGLYCERIN (NITROSTAT)  0.4 MG SL tablet Place 0.4 mg under the tongue every 5 (five) minutes as needed. For chest pain      . nystatin (MYCOSTATIN) 100000 UNIT/ML suspension Take 5 mLs (500,000 Units total) by mouth 4 (four) times daily.  60 mL  0  . omeprazole (PRILOSEC) 20 MG capsule Take 20 mg by mouth 2 (two) times daily.        . OXYGEN-HELIUM IN Inhale 4 L into the lungs continuous.      . prasugrel (EFFIENT) 10 MG TABS tablet Take 1 tablet (10 mg total) by mouth daily.  30 tablet  11  . PROAIR HFA 108 (90 BASE) MCG/ACT inhaler INHALE 2 PUFFS INTO THE LUNGS EVERY 6 HOURS AS NEEDED FOR WHEEZING.  8.5 each  5  . Umeclidinium-Vilanterol (ANORO ELLIPTA) 62.5-25 MCG/INH AEPB Inhale 1 puff into the  lungs daily.  60 each  5  . nystatin (MYCOSTATIN) 100000 UNIT/ML suspension Swish and swallow 4 mls three times daily X 5 days  60 mL  0   No facility-administered medications prior to visit.   Allergies  Allergen Reactions  . Amiodarone Other (See Comments)    Drops HR too low, SOB, fatigue  . Clopidogrel Bisulfate Hives  . Dilaudid [Hydromorphone Hcl] Other (See Comments)    "I couldn't function for 2 days."  . Multaq [Dronedarone] Other (See Comments)    Heart races    Patient Active Problem List   Diagnosis Date Noted  . Acute and chronic respiratory failure 07/20/2013  . Chest pain 07/20/2013  . Respiratory failure 07/20/2013  . Hypotension 01/24/2013  . Cardiomyopathy, ischemic - mild to moderate. EF ~45% 01/13/2013  . CAD (coronary artery disease), autologous vein bypass graft, cath 01/12/13 with Xience DES to VG-RCA 01/13/2013    Class: Acute  . Presence of drug coated stent in SVG-RCA 01/13/2013    Class: Status post  . Protein-calorie malnutrition, severe 01/11/2013  . Acute myocardial infarction, unspecified site, episode of care unspecified   . Esophageal dysmotility   . Esophageal stricture   . Peripheral neuropathy   . C. difficile diarrhea   . PVD (peripheral vascular disease)- Rt SCA PTA   . Headache   . CHF (congestive heart failure)   . Hypertension   . Hx of radiation therapy and chemo therapy   . 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 COPD with emphysema 10/27/2011  . CANDIDIASIS, ESOPHAGEAL 12/04/2009  . NONSPECIFIC ABNORMAL FINDING IN STOOL CONTENTS 12/04/2009  . HYPERLIPIDEMIA 07/31/2007  . ANXIETY 07/31/2007  . DEPRESSION 07/31/2007  . CAD- CABG '96. Last cath 2011, low risk Myoview 2/12 07/31/2007  . GERD 07/31/2007  . PEPTIC ULCER DISEASE 07/31/2007  . GASTRITIS, CHRONIC 03/17/2006  . HIATAL HERNIA 03/17/2006  . POLYP, COLON 01/21/2005  . HEMORRHOIDS 01/21/2005   History  Substance Use Topics  . Smoking status:  Former Smoker -- 1.50 packs/day for 30 years    Types: Cigarettes    Quit date: 05/24/2013  . Smokeless tobacco: Never Used  . Alcohol Use: No   family history includes Allergies in her father; Asthma in her father; Brain cancer in her mother; Breast cancer in her other; Cancer in her father; Heart disease in her father, mother, and sister; Hypertension (age of onset: 63) in her brother; Lung cancer in her father; Lung cancer (age of onset: 65) in her brother. There is no history of Colon cancer.     Objective:   Physical Exam well-developed chronically ill-appearing  very thin white female in no acute distress blood pressure 94/52 pulse 66 height 5 foot 6 weight 94. HEENT; nontraumatic normocephalic EOMI PERRLA sclera anicteric, Supple; no JVD, Cardiovascular; regular rate and rhythm with S1-S2 she is a soft systolic murmur and sternal incisional scar, Pulmonary; decreased breath sounds bilaterally, Abdomen ;soft nontender nondistended bowel sounds are present no palpable mass or hepatosplenomegaly, Rectal; exam not done, Extremities.; Thin, no clubbing cyanosis or edema, Psych ;mood and affect appropriate        Assessment & Plan:   #41 63 year old female with a 6 month history of solid and liquid dysphagia. I suspect this is a motility issue and not secondary to a stricture. #2 severe COPD, O2 dependent 4 L nasal cannula #3 chronic anticoagulation with Effient #4 coronary artery disease status post MI 2014 August-status post prior CABG #5 peripheral vascular disease #6 cardiomyopathy EF 45% #7 history of squamous cell lung CA 2013 status post radiation and chemotherapy #8 history of colon polyps last colonoscopy July 2013  Plan; patient is high risk for sedation for her EGD with her chronic lung disease. Will schedule for barium swallow with tablet to assess for esophageal stricture. Further plans pending results of barium swallow, if she does have a stricture and requires EGD she will  need to stop Effient /discuss with her cardiologist Dr. Gwenlyn Found for 7 days prior to the procedure and will need to be scheduled at the hospital.

## 2014-01-01 NOTE — Patient Instructions (Signed)
You have been scheduled for a Barium Esophogram at [facility] (1st floor of the hospital) on Monday 01-07-14 at 10:30 am . Please arrive at 10:15 am prior to your appointment for registration. Make certain not to have anything to eat or drink after 7:30 am  prior to your test. If you need to reschedule for any reason, please contact radiology at [phone] to do so. __________________________________________________________________ A barium swallow is an examination that concentrates on views of the esophagus. This tends to be a double contrast exam (barium and two liquids which, when combined, create a gas to distend the wall of the oesophagus) or single contrast (non-ionic iodine based). The study is usually tailored to your symptoms so a good history is essential. Attention is paid during the study to the form, structure and configuration of the esophagus, looking for functional disorders (such as aspiration, dysphagia, achalasia, motility and reflux) EXAMINATION You may be asked to change into a gown, depending on the type of swallow being performed. A radiologist and radiographer will perform the procedure. The radiologist will advise you of the type of contrast selected for your procedure and direct you during the exam. You will be asked to stand, sit or lie in several different positions and to hold a small amount of fluid in your mouth before being asked to swallow while the imaging is performed .In some instances you may be asked to swallow barium coated marshmallows to assess the motility of a solid food bolus. The exam can be recorded as a digital or video fluoroscopy procedure. POST PROCEDURE It will take 1-2 days for the barium to pass through your system. To facilitate this, it is important, unless otherwise directed, to increase your fluids for the next 24-48hrs and to resume your normal diet.  This test typically takes about 30 minutes to  perform. __________________________________________________________________________________

## 2014-01-06 ENCOUNTER — Other Ambulatory Visit: Payer: Self-pay | Admitting: Internal Medicine

## 2014-01-07 ENCOUNTER — Ambulatory Visit (HOSPITAL_COMMUNITY)
Admission: RE | Admit: 2014-01-07 | Discharge: 2014-01-07 | Disposition: A | Discharge status: Home / Self Care | Payer: Medicaid Other | Source: Ambulatory Visit | Attending: Physician Assistant | Admitting: Physician Assistant

## 2014-01-07 DIAGNOSIS — Z85118 Personal history of other malignant neoplasm of bronchus and lung: Secondary | ICD-10-CM

## 2014-01-07 DIAGNOSIS — Z923 Personal history of irradiation: Secondary | ICD-10-CM | POA: Diagnosis not present

## 2014-01-07 DIAGNOSIS — R1314 Dysphagia, pharyngoesophageal phase: Secondary | ICD-10-CM

## 2014-01-07 DIAGNOSIS — R4789 Other speech disturbances: Secondary | ICD-10-CM | POA: Insufficient documentation

## 2014-01-07 DIAGNOSIS — Z8719 Personal history of other diseases of the digestive system: Secondary | ICD-10-CM

## 2014-01-07 DIAGNOSIS — B370 Candidal stomatitis: Secondary | ICD-10-CM | POA: Insufficient documentation

## 2014-01-07 NOTE — Telephone Encounter (Signed)
Rx sent 

## 2014-01-07 NOTE — Telephone Encounter (Signed)
Message copied by Larina Bras on Mon Jan 07, 2014 10:10 AM ------      Message from: Lafayette Dragon      Created: Mon Jan 07, 2014  9:55 AM       OK to refill Omeprazole      ----- Message -----         From: Larina Bras, CMA         Sent: 01/07/2014   9:01 AM           To: Lafayette Dragon, MD            Patient requests refills of omeprazole. However, we have not given this since 2012. It appears she is scheduled for barium esophagram today. Do you want me to fill omeprazole or wait until results for barium esophagram come back first?       ------

## 2014-01-07 NOTE — Assessment & Plan Note (Signed)
She is to continue supplemental oxygen.  Will re-assess her oxygen needs at next visit.

## 2014-01-07 NOTE — Assessment & Plan Note (Addendum)
Will give her course of diflucan and nystatin.  Will d/c ICS from her inhaler regimen.

## 2014-01-07 NOTE — Assessment & Plan Note (Addendum)
She has recurrent thrush.  Will d/c ICS from her inhaler regimen.  Will have her try anoro in place of spiriva and symbicort.  She is to call back with any difficulties.  She can continue albuterol as needed.  She feels singulair helps >> will continue this for now.

## 2014-01-14 NOTE — Telephone Encounter (Signed)
Charting error.

## 2014-01-23 ENCOUNTER — Encounter: Payer: Self-pay | Admitting: Cardiology

## 2014-01-23 ENCOUNTER — Ambulatory Visit (INDEPENDENT_AMBULATORY_CARE_PROVIDER_SITE_OTHER): Payer: Medicaid Other | Admitting: Cardiology

## 2014-01-23 VITALS — BP 100/68 | HR 59 | Ht 66.0 in | Wt 96.9 lb

## 2014-01-23 DIAGNOSIS — F3289 Other specified depressive episodes: Secondary | ICD-10-CM

## 2014-01-23 DIAGNOSIS — F329 Major depressive disorder, single episode, unspecified: Secondary | ICD-10-CM

## 2014-01-23 DIAGNOSIS — IMO0002 Reserved for concepts with insufficient information to code with codable children: Secondary | ICD-10-CM

## 2014-01-23 DIAGNOSIS — J438 Other emphysema: Secondary | ICD-10-CM

## 2014-01-23 DIAGNOSIS — E43 Unspecified severe protein-calorie malnutrition: Secondary | ICD-10-CM

## 2014-01-23 DIAGNOSIS — C801 Malignant (primary) neoplasm, unspecified: Secondary | ICD-10-CM

## 2014-01-23 DIAGNOSIS — I2510 Atherosclerotic heart disease of native coronary artery without angina pectoris: Secondary | ICD-10-CM

## 2014-01-23 NOTE — Assessment & Plan Note (Addendum)
She takes 3-4 Ensure shakes a day- she is 96 lbs

## 2014-01-23 NOTE — Assessment & Plan Note (Signed)
On Diflucan and Nystatin. She apparently will not need an endoscopy.

## 2014-01-23 NOTE — Assessment & Plan Note (Signed)
Her husband Evelena Peat died recently and she is struggling with this

## 2014-01-23 NOTE — Assessment & Plan Note (Signed)
No angina 

## 2014-01-23 NOTE — Progress Notes (Signed)
01/23/2014 [name]   14-Mar-1951  [id number]  Primary Physicia Tivis Ringer, MD Primary Cardiologist: Dr Gwenlyn Found  HPI:  63 y/o female, long time pt of Dr Gwenlyn Found, with a history of CAD, PVD, COPD, and lung CA. She had CABG in '96. She had a DES placed to her SVG-RCA in Aug 2014. She is allergic to Plavix which caused hives in the past. She has severe COPD and is O2 PRN. She had lung cancer treated with radiation and chemo in 2013. In the past she had problems with chronic back pain, though this seems to have resolved. Her husband [name] was a long time pt of our and he recently passed. She is struggling with this. She lives in her own home with her 50 y/o grandson who she and [name] raised. She recenlty was diagnosed and treated for oral thrush (secondary to inhalers). She is 96 lbs but says she has 3-4 Ensure shakes daily. She denies any angina, and actually looks more relaxed to me than she has in years.    Current Outpatient Prescriptions  Medication Sig Dispense Refill  . aspirin 81 MG EC tablet Take 81 mg by mouth daily.        Marland Kitchen atorvastatin (LIPITOR) 80 MG tablet Take 1 tablet (80 mg total) by mouth at bedtime.  30 tablet  5  . Cholecalciferol (VITAMIN D-3) 5000 UNITS TABS Take 1 tablet by mouth daily.      . diazepam (VALIUM) 5 MG tablet Take 5 mg by mouth 2 (two) times daily.      Marland Kitchen ezetimibe (ZETIA) 10 MG tablet Take 10 mg by mouth daily.        . famotidine (PEPCID) 20 MG tablet Take 20 mg by mouth at bedtime.      . fluconazole (DIFLUCAN) 100 MG tablet Take 1 tablet (100 mg total) by mouth daily.  5 tablet  0  . guaiFENesin (MUCINEX) 600 MG 12 hr tablet Take 2 tablets (1,200 mg total) by mouth 2 (two) times daily.  30 tablet  0  . HYDROcodone-acetaminophen (NORCO) 10-325 MG per tablet Take 1 tablet by mouth every 6 (six) hours as needed.      . montelukast (SINGULAIR) 10 MG tablet Take 10 mg by mouth at bedtime.      . nitroGLYCERIN (NITROSTAT) 0.4 MG SL tablet Place 0.4  mg under the tongue every 5 (five) minutes as needed. For chest pain      . nystatin (MYCOSTATIN) 100000 UNIT/ML suspension Take 5 mLs (500,000 Units total) by mouth 4 (four) times daily.  60 mL  0  . omeprazole (PRILOSEC) 20 MG capsule TAKE ONE CAPSULE BY MOUTH TWICE DAILY  60 capsule  2  . OXYGEN-HELIUM IN Inhale 4 L into the lungs continuous.      . prasugrel (EFFIENT) 10 MG TABS tablet Take 1 tablet (10 mg total) by mouth daily.  30 tablet  11  . PROAIR HFA 108 (90 BASE) MCG/ACT inhaler INHALE 2 PUFFS INTO THE LUNGS EVERY 6 HOURS AS NEEDED FOR WHEEZING.  8.5 each  5  . Umeclidinium-Vilanterol (ANORO ELLIPTA) 62.5-25 MCG/INH AEPB Inhale 1 puff into the lungs daily.  60 each  5   No current facility-administered medications for this visit.    Allergies  Allergen Reactions  . Amiodarone Other (See Comments)    Drops HR too low, SOB, fatigue  . Clopidogrel Bisulfate Hives  . Dilaudid [Hydromorphone Hcl] Other (See Comments)    "I couldn't function  for 2 days."  . Multaq [Dronedarone] Other (See Comments)    Heart races     History   Social History  . Marital Status: Married    Spouse Name: N/A    Number of Children: 2  . Years of Education: N/A   Occupational History  . disabled     back problems   Social History Main Topics  . Smoking status: Former Smoker -- 1.50 packs/day for 30 years    Types: Cigarettes    Quit date: 05/24/2013  . Smokeless tobacco: Never Used  . Alcohol Use: No  . Drug Use: Yes    Special: Flunitrazepam  . Sexual Activity: Not on file   Other Topics Concern  . Not on file   Social History Narrative   Married with 2 children, husband has Alzheimer's dementia, retired now, previously Corporate treasurer, Quit smoking in [place]: General: negative for chills, fever, night sweats or weight changes.  Cardiovascular: negative for chest pain, dyspnea on exertion, edema, orthopnea, palpitations, paroxysmal nocturnal  dyspnea or shortness of breath Dermatological: negative for rash Respiratory: negative for cough or wheezing Urologic: negative for hematuria Abdominal: negative for nausea, vomiting, diarrhea, bright red blood per rectum, melena, or hematemesis Neurologic: negative for visual changes, syncope, or dizziness All other systems reviewed and are otherwise negative except as noted above.    Blood pressure 100/68, pulse 59, height 5\' 6"  (1.676 m), weight 96 lb 14.4 oz (43.954 kg).  General appearance: alert, cooperative, cachectic and no distress Lungs: decreased breath sounds Heart: regular rate and rhythm Extremities: no edema  EKG NSR, SB 59  ASSESSMENT AND PLAN:   CAD- CABG '96. 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 She apparently will not need an endoscopy.   PLAN  I did not change her medications. Dr Gwenlyn Found came in to offer his condolences. She will see Dr Gwenlyn Found in 6 months.   [name] KPA-C 01/23/2014 9:35 AM

## 2014-01-23 NOTE — Assessment & Plan Note (Signed)
Severe, chronic PRN O2

## 2014-01-23 NOTE — Patient Instructions (Signed)
Your physician wants you to follow-up in: 6 months with Dr Berry. You will receive a reminder letter in the mail two months in advance. If you don't receive a letter, please call our office to schedule the follow-up appointment.  

## 2014-01-23 NOTE — Assessment & Plan Note (Signed)
S/P radiation, chemo 2013

## 2014-01-25 ENCOUNTER — Telehealth: Payer: Self-pay | Admitting: *Deleted

## 2014-01-25 NOTE — Telephone Encounter (Signed)
Office note from [place] dated 01/22/14 given to Dr Vista Mink to review.  SLJ

## 2014-02-12 ENCOUNTER — Telehealth: Payer: Self-pay | Admitting: Pulmonary Disease

## 2014-02-12 MED ORDER — PREDNISONE 10 MG PO TABS: ORAL_TABLET | ORAL | Status: DC

## 2014-02-12 MED ORDER — DOXYCYCLINE HYCLATE 100 MG PO TABS: 100.0000 mg | ORAL_TABLET | Freq: Two times a day (BID) | ORAL | Status: DC

## 2014-02-12 NOTE — Telephone Encounter (Signed)
Pt states she is having increased wheezing and productive cough-green in color; no fevers and can not come in for appt. Would like Rx sent to pharmacy.  Allergies  Allergen Reactions  . Amiodarone Other (See Comments)    Drops HR too low, SOB, fatigue  . Clopidogrel Bisulfate Hives  . Dilaudid [Hydromorphone Hcl] Other (See Comments)    "I couldn't function for 2 days."  . Multaq [Dronedarone] Other (See Comments)    Heart races

## 2014-02-12 NOTE — Telephone Encounter (Signed)
Send script for prednisone 10 mg pill >> 3 pills for 2 days, 2 pills for 2 days, 1 pill for 2 days.  Dispense 12 pills with no refills.  Send script for doxycycline 100 mg pill bid for 7 days, dispense 14 with no refills.  Have her call back or go to hospital if not improving.

## 2014-02-12 NOTE — Telephone Encounter (Signed)
Called pt. Line rang numerous times. No VM. WCB

## 2014-02-12 NOTE — Telephone Encounter (Signed)
Called pt. Aware of recs. RX sent in. Nothing further needed 

## 2014-03-03 ENCOUNTER — Emergency Department (HOSPITAL_COMMUNITY): Payer: Medicaid Other

## 2014-03-03 ENCOUNTER — Encounter (HOSPITAL_COMMUNITY): Payer: Self-pay | Admitting: Emergency Medicine

## 2014-03-03 ENCOUNTER — Inpatient Hospital Stay (HOSPITAL_COMMUNITY)
Admission: EM | Admit: 2014-03-03 | Discharge: 2014-03-05 | DRG: 190 | Disposition: A | Discharge status: Home / Self Care | Payer: Medicaid Other | Attending: Internal Medicine | Admitting: Internal Medicine

## 2014-03-03 DIAGNOSIS — Z951 Presence of aortocoronary bypass graft: Secondary | ICD-10-CM | POA: Diagnosis not present

## 2014-03-03 DIAGNOSIS — I2510 Atherosclerotic heart disease of native coronary artery without angina pectoris: Secondary | ICD-10-CM | POA: Diagnosis present

## 2014-03-03 DIAGNOSIS — Z720 Tobacco use: Secondary | ICD-10-CM

## 2014-03-03 DIAGNOSIS — I252 Old myocardial infarction: Secondary | ICD-10-CM | POA: Diagnosis not present

## 2014-03-03 DIAGNOSIS — E43 Unspecified severe protein-calorie malnutrition: Secondary | ICD-10-CM | POA: Diagnosis present

## 2014-03-03 DIAGNOSIS — K219 Gastro-esophageal reflux disease without esophagitis: Secondary | ICD-10-CM | POA: Diagnosis present

## 2014-03-03 DIAGNOSIS — J441 Chronic obstructive pulmonary disease with (acute) exacerbation: Principal | ICD-10-CM | POA: Diagnosis present

## 2014-03-03 DIAGNOSIS — J9610 Chronic respiratory failure, unspecified whether with hypoxia or hypercapnia: Secondary | ICD-10-CM | POA: Diagnosis present

## 2014-03-03 DIAGNOSIS — H53121 Transient visual loss, right eye: Secondary | ICD-10-CM | POA: Diagnosis present

## 2014-03-03 DIAGNOSIS — E785 Hyperlipidemia, unspecified: Secondary | ICD-10-CM | POA: Diagnosis present

## 2014-03-03 DIAGNOSIS — Z7982 Long term (current) use of aspirin: Secondary | ICD-10-CM | POA: Diagnosis not present

## 2014-03-03 DIAGNOSIS — I10 Essential (primary) hypertension: Secondary | ICD-10-CM | POA: Diagnosis present

## 2014-03-03 DIAGNOSIS — Z90710 Acquired absence of both cervix and uterus: Secondary | ICD-10-CM

## 2014-03-03 DIAGNOSIS — Z923 Personal history of irradiation: Secondary | ICD-10-CM

## 2014-03-03 DIAGNOSIS — G459 Transient cerebral ischemic attack, unspecified: Secondary | ICD-10-CM | POA: Diagnosis present

## 2014-03-03 DIAGNOSIS — Z9981 Dependence on supplemental oxygen: Secondary | ICD-10-CM

## 2014-03-03 DIAGNOSIS — F17210 Nicotine dependence, cigarettes, uncomplicated: Secondary | ICD-10-CM | POA: Diagnosis present

## 2014-03-03 DIAGNOSIS — Z85118 Personal history of other malignant neoplasm of bronchus and lung: Secondary | ICD-10-CM

## 2014-03-03 DIAGNOSIS — I25810 Atherosclerosis of coronary artery bypass graft(s) without angina pectoris: Secondary | ICD-10-CM | POA: Diagnosis present

## 2014-03-03 DIAGNOSIS — R51 Headache: Secondary | ICD-10-CM

## 2014-03-03 DIAGNOSIS — F411 Generalized anxiety disorder: Secondary | ICD-10-CM | POA: Diagnosis present

## 2014-03-03 DIAGNOSIS — G453 Amaurosis fugax: Secondary | ICD-10-CM

## 2014-03-03 DIAGNOSIS — Z681 Body mass index (BMI) 19 or less, adult: Secondary | ICD-10-CM | POA: Diagnosis not present

## 2014-03-03 DIAGNOSIS — R05 Cough: Secondary | ICD-10-CM | POA: Diagnosis present

## 2014-03-03 DIAGNOSIS — Z955 Presence of coronary angioplasty implant and graft: Secondary | ICD-10-CM | POA: Diagnosis not present

## 2014-03-03 DIAGNOSIS — R519 Headache, unspecified: Secondary | ICD-10-CM

## 2014-03-03 LAB — CBC WITH DIFFERENTIAL/PLATELET
BASOS PCT: 0 % (ref 0–1)
Basophils Absolute: 0 10*3/uL (ref 0.0–0.1)
EOS ABS: 0 10*3/uL (ref 0.0–0.7)
Eosinophils Relative: 0 % (ref 0–5)
HCT: 41 % (ref 36.0–46.0)
Hemoglobin: 14.2 g/dL (ref 12.0–15.0)
Lymphocytes Relative: 6 % — ABNORMAL LOW (ref 12–46)
Lymphs Abs: 0.4 10*3/uL — ABNORMAL LOW (ref 0.7–4.0)
MCH: 32.8 pg (ref 26.0–34.0)
MCHC: 34.6 g/dL (ref 30.0–36.0)
MCV: 94.7 fL (ref 78.0–100.0)
MONOS PCT: 7 % (ref 3–12)
Monocytes Absolute: 0.5 10*3/uL (ref 0.1–1.0)
Neutro Abs: 6.1 10*3/uL (ref 1.7–7.7)
Neutrophils Relative %: 87 % — ABNORMAL HIGH (ref 43–77)
Platelets: 214 10*3/uL (ref 150–400)
RBC: 4.33 MIL/uL (ref 3.87–5.11)
RDW: 13 % (ref 11.5–15.5)
WBC: 7 10*3/uL (ref 4.0–10.5)

## 2014-03-03 LAB — COMPREHENSIVE METABOLIC PANEL
ALK PHOS: 117 U/L (ref 39–117)
ALT: 16 U/L (ref 0–35)
ANION GAP: 13 (ref 5–15)
AST: 21 U/L (ref 0–37)
Albumin: 4.6 g/dL (ref 3.5–5.2)
BILIRUBIN TOTAL: 0.4 mg/dL (ref 0.3–1.2)
BUN: 6 mg/dL (ref 6–23)
CHLORIDE: 97 meq/L (ref 96–112)
CO2: 28 mEq/L (ref 19–32)
Calcium: 9.7 mg/dL (ref 8.4–10.5)
Creatinine, Ser: 0.51 mg/dL (ref 0.50–1.10)
GFR calc non Af Amer: >90 mL/min (ref >90)
GLUCOSE: 93 mg/dL (ref 70–99)
Potassium: 3.5 mEq/L — ABNORMAL LOW (ref 3.7–5.3)
Sodium: 138 mEq/L (ref 137–147)
TOTAL PROTEIN: 8.2 g/dL (ref 6.0–8.3)

## 2014-03-03 LAB — GLUCOSE, CAPILLARY: Glucose-Capillary: 104 mg/dL — ABNORMAL HIGH (ref 70–99)

## 2014-03-03 LAB — PRO B NATRIURETIC PEPTIDE: Pro B Natriuretic peptide (BNP): 475.8 pg/mL — ABNORMAL HIGH (ref 0–125)

## 2014-03-03 LAB — TROPONIN I

## 2014-03-03 MED ORDER — METHYLPREDNISOLONE SODIUM SUCC 125 MG IJ SOLR
80.0000 mg | Freq: Four times a day (QID) | INTRAMUSCULAR | Status: DC
  Administered 2014-03-03 – 2014-03-04 (×2): 80 mg via INTRAVENOUS
  Filled 2014-03-03 (×3): qty 1.28
  Filled 2014-03-03 (×2): qty 2

## 2014-03-03 MED ORDER — LEVOFLOXACIN IN D5W 750 MG/150ML IV SOLN
750.0000 mg | INTRAVENOUS | Status: DC
  Administered 2014-03-03 – 2014-03-04 (×2): 750 mg via INTRAVENOUS
  Filled 2014-03-03 (×2): qty 150

## 2014-03-03 MED ORDER — ASPIRIN 81 MG PO TBEC: 81.0000 mg | DELAYED_RELEASE_TABLET | Freq: Every day | ORAL | Status: DC

## 2014-03-03 MED ORDER — PANTOPRAZOLE SODIUM 40 MG PO TBEC
40.0000 mg | DELAYED_RELEASE_TABLET | Freq: Every day | ORAL | Status: DC
  Administered 2014-03-04 – 2014-03-05 (×2): 40 mg via ORAL
  Filled 2014-03-03 (×2): qty 1

## 2014-03-03 MED ORDER — ALBUTEROL SULFATE (2.5 MG/3ML) 0.083% IN NEBU: 2.5000 mg | INHALATION_SOLUTION | Freq: Three times a day (TID) | RESPIRATORY_TRACT | Status: DC

## 2014-03-03 MED ORDER — ALBUTEROL SULFATE (2.5 MG/3ML) 0.083% IN NEBU: 2.5000 mg | INHALATION_SOLUTION | RESPIRATORY_TRACT | Status: DC | PRN

## 2014-03-03 MED ORDER — MORPHINE SULFATE 2 MG/ML IJ SOLN
2.0000 mg | Freq: Once | INTRAMUSCULAR | Status: AC
  Administered 2014-03-03: 2 mg via INTRAVENOUS
  Filled 2014-03-03: qty 1

## 2014-03-03 MED ORDER — ALBUTEROL SULFATE (2.5 MG/3ML) 0.083% IN NEBU
2.5000 mg | INHALATION_SOLUTION | RESPIRATORY_TRACT | Status: DC
  Administered 2014-03-03: 2.5 mg via RESPIRATORY_TRACT
  Filled 2014-03-03: qty 3

## 2014-03-03 MED ORDER — STROKE: EARLY STAGES OF RECOVERY BOOK
Freq: Once | Status: AC
  Administered 2014-03-03: 19:00:00
  Filled 2014-03-03: qty 1

## 2014-03-03 MED ORDER — HYDROCODONE-ACETAMINOPHEN 10-325 MG PO TABS
1.0000 | ORAL_TABLET | Freq: Four times a day (QID) | ORAL | Status: DC | PRN
  Administered 2014-03-03 – 2014-03-04 (×3): 1 via ORAL
  Filled 2014-03-03 (×3): qty 1

## 2014-03-03 MED ORDER — ASPIRIN EC 81 MG PO TBEC
81.0000 mg | DELAYED_RELEASE_TABLET | Freq: Every day | ORAL | Status: DC
  Administered 2014-03-04 – 2014-03-05 (×2): 81 mg via ORAL
  Filled 2014-03-03 (×3): qty 1

## 2014-03-03 MED ORDER — ATORVASTATIN CALCIUM 80 MG PO TABS
80.0000 mg | ORAL_TABLET | Freq: Every day | ORAL | Status: DC
  Administered 2014-03-03 – 2014-03-04 (×2): 80 mg via ORAL
  Filled 2014-03-03 (×2): qty 1

## 2014-03-03 MED ORDER — DIAZEPAM 5 MG PO TABS
5.0000 mg | ORAL_TABLET | Freq: Two times a day (BID) | ORAL | Status: DC
  Administered 2014-03-03 – 2014-03-05 (×4): 5 mg via ORAL
  Filled 2014-03-03 (×4): qty 1

## 2014-03-03 MED ORDER — ENOXAPARIN SODIUM 40 MG/0.4ML ~~LOC~~ SOLN
40.0000 mg | SUBCUTANEOUS | Status: DC
  Administered 2014-03-03 – 2014-03-04 (×2): 40 mg via SUBCUTANEOUS
  Filled 2014-03-03 (×3): qty 0.4

## 2014-03-03 MED ORDER — HYDROCODONE-ACETAMINOPHEN 5-325 MG PO TABS
2.0000 | ORAL_TABLET | Freq: Once | ORAL | Status: AC
  Administered 2014-03-03: 2 via ORAL
  Filled 2014-03-03: qty 2

## 2014-03-03 MED ORDER — ALBUTEROL (5 MG/ML) CONTINUOUS INHALATION SOLN
10.0000 mg/h | INHALATION_SOLUTION | RESPIRATORY_TRACT | Status: DC
  Administered 2014-03-03: 10 mg/h via RESPIRATORY_TRACT
  Filled 2014-03-03: qty 20

## 2014-03-03 MED ORDER — FAMOTIDINE 20 MG PO TABS
20.0000 mg | ORAL_TABLET | Freq: Every day | ORAL | Status: DC
  Administered 2014-03-03 – 2014-03-04 (×2): 20 mg via ORAL
  Filled 2014-03-03 (×2): qty 1

## 2014-03-03 MED ORDER — EZETIMIBE 10 MG PO TABS
10.0000 mg | ORAL_TABLET | Freq: Every day | ORAL | Status: DC
  Administered 2014-03-04 – 2014-03-05 (×2): 10 mg via ORAL
  Filled 2014-03-03 (×2): qty 1

## 2014-03-03 MED ORDER — UMECLIDINIUM-VILANTEROL 62.5-25 MCG/INH IN AEPB: 1.0000 | INHALATION_SPRAY | Freq: Every day | RESPIRATORY_TRACT | Status: DC

## 2014-03-03 MED ORDER — ONDANSETRON HCL 4 MG/2ML IJ SOLN
4.0000 mg | Freq: Once | INTRAMUSCULAR | Status: AC
  Administered 2014-03-03: 4 mg via INTRAVENOUS
  Filled 2014-03-03: qty 2

## 2014-03-03 MED ORDER — PRASUGREL HCL 10 MG PO TABS
10.0000 mg | ORAL_TABLET | Freq: Every day | ORAL | Status: DC
  Administered 2014-03-04: 10 mg via ORAL
  Filled 2014-03-03 (×2): qty 1

## 2014-03-03 MED ORDER — MONTELUKAST SODIUM 10 MG PO TABS
10.0000 mg | ORAL_TABLET | Freq: Every day | ORAL | Status: DC
  Administered 2014-03-03 – 2014-03-04 (×2): 10 mg via ORAL
  Filled 2014-03-03 (×2): qty 1

## 2014-03-03 NOTE — ED Provider Notes (Addendum)
CSN: [id number]     Arrival date & time 03/03/14  1138 History   First MD Initiated Contact with Patient 03/03/14 1138     Chief Complaint  Patient presents with  . Shortness of Breath  . Cough     (Consider location/radiation/quality/duration/timing/severity/associated sxs/prior Treatment) HPI Comments: Patient with previous history of COPD and lung cancer presents to the ER for shortness of breath. Patient reports onset of symptoms 3 days ago. She has had persistent cough productive of yellow sputum and progressively worsening shortness of breath. She is brought to the ER by EMS. She was given albuterol 5 mg, Atrovent 0.5 mg during transport. She reports no improvement. Is on 3 L nasal cannula O2 at all times at home.  Patient is a 63 y.o. female presenting with shortness of breath and cough.  Shortness of Breath Associated symptoms: cough and wheezing   Cough Associated symptoms: shortness of breath and wheezing     Past Medical History  Diagnosis Date  . Acute myocardial infarction, unspecified site, episode of care unspecified 2011    VF arrest. Ruled in, cath with no clear culprit lesion, inferior WMA on LV gram, medical therapy  . Esophageal dysmotility   . Hemorrhoids   . Esophageal stricture   . Diaphragmatic hernia without mention of obstruction or gangrene   . 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 CAD (coronary artery disease)     CABG'96., Xience DES -VG-RCA (01/12/13)  . Peripheral neuropathy   . Hyperlipidemia   . Peptic ulcer, unspecified site, unspecified as acute or chronic, without mention of hemorrhage, perforation, or obstruction   . Depressive disorder, not elsewhere classified   . Anxiety state, unspecified   . COPD (chronic obstructive pulmonary disease)   . GERD (gastroesophageal reflux disease)   . Candida esophagitis     on multiple occasions  . C. difficile diarrhea   . PVD (peripheral vascular disease)   . Headache(784.0)   .  Weight loss 06/13/2011    scheduled here by family doctor  . CHF (congestive heart failure) 2011  . Asthma   . Hypertension   . History of nuclear stress test 07/14/2010    dipyriadmole; normal pattern of perfusion, low risk   . History of tobacco use     quit 05/2009  . Non-small cell carcinoma of lung, stage 3     Dr. Roxan Hockey  . History of radiation therapy 02/28/2012-04/14/2012    right lung   Past Surgical History  Procedure Laterality Date  . Tonsillectomy  1974  . Fracture lt jaw  1976  . Breast surgery  1969, 1970    bilateral  . Epigastric hernia repair  2008    Dr Roxan Hockey  . Coronary artery bypass graft  02/01/1994    left internal mammary to LAD, vein graft to diagonal/post descending, post lateral, marginals, circumfle, diagonal  . Carpal tunnel release    . Total abdominal hysterectomy  1998  . Laminectomy    . Subclavian vein angioplasty / stenting Left   . Transthoracic echocardiogram  08/14/2009    EF=>55%, normal LV systolic function, mild hypokinesis of basal segments of inferolateral & anterolater walls; mild MR/TR; AV mildly sclerotic  . Coronary angioplasty with stent placement  08/29/2000    stent to RCA (Dr. Corky Downs)  . Cardiac catheterization  01/17/2002    patent grafts (Dr. Marella Chimes)  . Cardiac catheterization  02/13/2004    significant L main & 3 vessel CAD, patents grafts,  mild distal aortic disease (Dr. Gerrie Nordmann)  . Cardiac catheterization  07/14/2005    patent grafts, normal LV function (Dr. Adora Fridge)  . Cardiac catheterization  09/26/2008    patent grafts, normal LV function (Dr. Adora Fridge)  . Cardiac catheterization  05/31/2009    patent grafts, inferior wall motion abnormality (Dr. Adora Fridge)  . Cardiac catheterization  10/03/2009    normal LV function; 100% native RCA, circumflex, distal LAD occlusion  . Coronary angioplasty with stent placement  01/12/2013    Xience DES to SVG-RCA distal  . Carotid doppler  08/14/2009    R & L ICAs - 0-49%  diameter reduction    Family History  Problem Relation Age of Onset  . Lung cancer Father     also CVA  . Heart disease Father   . Asthma Father   . Allergies Father   . Cancer Father   . Heart disease Mother     also CVA, MI  . Brain cancer Mother   . Heart disease Sister   . Colon cancer Neg Hx   . Lung cancer Brother 77    also ENT cancer  . Hypertension Brother 80    also aneurysm, early CAD  . Breast cancer Other     family hx   History  Substance Use Topics  . Smoking status: Former Smoker -- 1.50 packs/day for 30 years    Types: Cigarettes    Quit date: 05/24/2013  . Smokeless tobacco: Never Used  . Alcohol Use: No   OB History   Grav Para Term Preterm Abortions TAB SAB Ect Mult Living                 Review of Systems  Respiratory: Positive for cough, shortness of breath and wheezing.   All other systems reviewed and are negative.     Allergies  Amiodarone; Clopidogrel bisulfate; Dilaudid; and Multaq  Home Medications   Prior to Admission medications   Medication Sig Start Date End Date Taking? Authorizing Provider  albuterol (PROVENTIL HFA;VENTOLIN HFA) 108 (90 BASE) MCG/ACT inhaler Inhale 2 puffs into the lungs every 6 (six) hours as needed for wheezing or shortness of breath.   Yes Historical Provider, MD  aspirin 81 MG EC tablet Take 81 mg by mouth daily.     Yes Historical Provider, MD  atorvastatin (LIPITOR) 80 MG tablet Take 80 mg by mouth at bedtime.   Yes Historical Provider, MD  Cholecalciferol (VITAMIN D-3) 5000 UNITS TABS Take 1 tablet by mouth daily.   Yes Historical Provider, MD  diazepam (VALIUM) 5 MG tablet Take 5 mg by mouth 2 (two) times daily.   Yes Historical Provider, MD  ezetimibe (ZETIA) 10 MG tablet Take 10 mg by mouth daily.     Yes Historical Provider, MD  famotidine (PEPCID) 20 MG tablet Take 20 mg by mouth at bedtime.   Yes Historical Provider, MD  HYDROcodone-acetaminophen (NORCO) 10-325 MG per tablet Take 1 tablet by mouth  every 6 (six) hours as needed for moderate pain.    Yes Historical Provider, MD  montelukast (SINGULAIR) 10 MG tablet Take 10 mg by mouth at bedtime.   Yes Historical Provider, MD  nitroGLYCERIN (NITROSTAT) 0.4 MG SL tablet Place 0.4 mg under the tongue every 5 (five) minutes as needed. For chest pain   Yes Historical Provider, MD  omeprazole (PRILOSEC) 20 MG capsule Take 20 mg by mouth 2 (two) times daily before a meal.   Yes Historical Provider,  MD  OXYGEN-HELIUM IN Inhale 4 L into the lungs continuous.   Yes Historical Provider, MD  prasugrel (EFFIENT) 10 MG TABS tablet Take 10 mg by mouth daily.   Yes Historical Provider, MD  Umeclidinium-Vilanterol (ANORO ELLIPTA) 62.5-25 MCG/INH AEPB Inhale 1 puff into the lungs daily.   Yes Historical Provider, MD  doxycycline (VIBRA-TABS) 100 MG tablet Take 1 tablet (100 mg total) by mouth 2 (two) times daily. 02/12/14   Chesley Mires, MD   BP 127/57  Pulse 97  Temp(Src) 99.1 F (37.3 C) (Oral)  Resp 21  SpO2 99% Physical Exam  Constitutional: She is oriented to person, place, and time. She appears distressed.  Very thin  HENT:  Head: Normocephalic and atraumatic.  Right Ear: Hearing normal.  Left Ear: Hearing normal.  Nose: Nose normal.  Mouth/Throat: Oropharynx is clear and moist and mucous membranes are normal.  Eyes: Conjunctivae and EOM are normal. Pupils are equal, round, and reactive to light.  Neck: Normal range of motion. Neck supple.  Cardiovascular: Regular rhythm, S1 normal and S2 normal.  Tachycardia present.  Exam reveals no gallop and no friction rub.   No murmur heard. Pulmonary/Chest: Tachypnea noted. No respiratory distress. She has wheezes. She exhibits no tenderness.  Abdominal: Soft. Normal appearance and bowel sounds are normal. There is no hepatosplenomegaly. There is no tenderness. There is no rebound, no guarding, no tenderness at McBurney's point and negative Murphy's sign. No hernia.  Musculoskeletal: Normal range of  motion.  Neurological: She is alert and oriented to person, place, and time. She has normal strength. No cranial nerve deficit or sensory deficit. Coordination normal. GCS eye subscore is 4. GCS verbal subscore is 5. GCS motor subscore is 6.  Skin: Skin is warm, dry and intact. No rash noted. No cyanosis.  Psychiatric: She has a normal mood and affect. Her speech is normal and behavior is normal. Thought content normal.    ED Course  Procedures (including critical care time) Labs Review Labs Reviewed  CBC WITH DIFFERENTIAL - Abnormal; Notable for the following:    Neutrophils Relative % 87 (*)    Lymphocytes Relative 6 (*)    Lymphs Abs 0.4 (*)    All other components within normal limits  COMPREHENSIVE METABOLIC PANEL - Abnormal; Notable for the following:    Potassium 3.5 (*)    All other components within normal limits  PRO B NATRIURETIC PEPTIDE - Abnormal; Notable for the following:    Pro B Natriuretic peptide (BNP) 475.8 (*)    All other components within normal limits  TROPONIN I  I-STAT ARTERIAL BLOOD GAS, ED    Imaging Review Dg Chest 2 View  03/03/2014   CLINICAL DATA:  Mid sternal chest pain, shortness of breath. History of CHF, COPD.  EXAM: CHEST  2 VIEW  COMPARISON:  Chest CT 10/05/2013.  Chest x-ray 09/11/2013.  FINDINGS: Prior CABG. Heart is normal size. There is hyperinflation of the lungs compatible with COPD. No focal airspace opacities or effusions.  Postoperative changes in the lower thoracic and visualized upper lumbar spine.  IMPRESSION: COPD.  Prior CABG.  No active disease.   Electronically Signed   By: Rolm Baptise M.D.   On: 03/03/2014 13:25     EKG Interpretation   Date/Time:  Sunday March 03 2014 11:53:34 EDT Ventricular Rate:  118 PR Interval:  151 QRS Duration: 83 QT Interval:  291 QTC Calculation: 408 R Axis:   -32 Text Interpretation:  Age not entered, assumed to be  63 years old for  purpose of ECG interpretation Sinus tachycardia Multiform  ventricular  premature complexes LAE, consider biatrial enlargement Left axis deviation  RSR' in V1 or V2, right VCD or RVH Nonspecific repol abnormality, lateral  leads No significant change since last tracing Confirmed by Rachel Samples  MD,  [place] [phone]) on 03/03/2014 12:39:54 PM      MDM   Final diagnoses:  None   COPD exacerbation  Patient presents to the ER for evaluation of shortness of breath, cough and chest congestion. Patient does have a history of COPD. Cough has been productive of yellow sputum. She has not had any fever. He presents with mild tachycardia secondary to her difficulty breathing. She has evidence of proximal spasm arrival. She has been given a nebulizer treatment during transport without much improvement. She is significantly diminished breath sounds with wheezing at the bases. Chest x-ray, however, does not show any evidence of pneumonia. Cardiac workup unrevealing. Patient still tachypneic with evidence of bronchospasm despite treatment, will require hospitalization for further management.  Addendum: Patient now tells me that she had an episode of double vision last night at 1 AM. She reports that she felt like her right eye was not moving appropriately. There was some mild swelling around her eye as well. My examination does not reveal any extraocular muscle abnormality currently. Neurologic examination was unremarkable. Patient complaining of headache. CT scan was performed and did not show any acute intracranial abnormality.  Orpah Greek, MD 03/03/14 [place], MD 03/03/14 1623

## 2014-03-03 NOTE — H&P (Signed)
[facility] History and Physical  BEV DRENNEN ZOX:[id number] DOB: 03/17/1951 DOA: 03/03/2014  Referring physician: er PCP: Tivis Ringer, MD   Chief Complaint: sob  HPI: [name] is a 63 y.o. female  With a 3 days history of cough- with yellow sputum.  She has a history of lung cancer, is on home O2 and continues to smoke.   She was given albuterol/atrovent by EMS with minimal improvement. Patient also acknowledges a 20 minute transient blindness of her right eye.  Described as a shade coming down.  This has resolved.  In the Er, patient was given continuous nebulizer, a ct head was done that did not show a CVA.  Chest x ray showed COPD with prior CABG.     Review of Systems:  All systems reviewed, negative unless stated above   Past Medical History  Diagnosis Date  . Acute myocardial infarction, unspecified site, episode of care unspecified 2011    VF arrest. Ruled in, cath with no clear culprit lesion, inferior WMA on LV gram, medical therapy  . Esophageal dysmotility   . Hemorrhoids   . Esophageal stricture   . Diaphragmatic hernia without mention of obstruction or gangrene   . 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 CAD (coronary artery disease)     CABG'96., Xience DES -VG-RCA (01/12/13)  . Peripheral neuropathy   . Hyperlipidemia   . Peptic ulcer, unspecified site, unspecified as acute or chronic, without mention of hemorrhage, perforation, or obstruction   . Depressive disorder, not elsewhere classified   . Anxiety state, unspecified   . COPD (chronic obstructive pulmonary disease)   . GERD (gastroesophageal reflux disease)   . Candida esophagitis     on multiple occasions  . C. difficile diarrhea   . PVD (peripheral vascular disease)   . Headache(784.0)   . Weight loss 06/13/2011    scheduled here by family doctor  . CHF (congestive heart failure) 2011  . Asthma   . Hypertension   . History of nuclear stress test 07/14/2010    dipyriadmole; normal pattern of perfusion, low risk   . History of tobacco use     quit 05/2009  . Non-small cell carcinoma of lung, stage 3     Dr. Roxan Hockey  . History of radiation therapy 02/28/2012-04/14/2012    right lung   Past Surgical History  Procedure Laterality Date  . Tonsillectomy  1974  . Fracture lt jaw  1976  . Breast surgery  1969, 1970    bilateral  . Epigastric hernia repair  2008    Dr Roxan Hockey  . Coronary artery bypass graft  02/01/1994    left internal mammary to LAD, vein graft to diagonal/post descending, post lateral, marginals, circumfle, diagonal  . Carpal tunnel release    . Total abdominal hysterectomy  1998  . Laminectomy    . Subclavian vein angioplasty / stenting Left   . Transthoracic echocardiogram  08/14/2009    EF=>55%, normal LV systolic function, mild hypokinesis of basal segments of inferolateral & anterolater walls; mild MR/TR; AV mildly sclerotic  . Coronary angioplasty with stent placement  08/29/2000    stent to RCA (Dr. Corky Downs)  . Cardiac catheterization  01/17/2002    patent grafts (Dr. Marella Chimes)  . Cardiac catheterization  02/13/2004    significant L main & 3 vessel CAD, patents grafts, mild distal aortic disease (Dr. Gerrie Nordmann)  . Cardiac catheterization  07/14/2005    patent grafts, normal LV function (  Dr. Adora Fridge)  . Cardiac catheterization  09/26/2008    patent grafts, normal LV function (Dr. Adora Fridge)  . Cardiac catheterization  05/31/2009    patent grafts, inferior wall motion abnormality (Dr. Adora Fridge)  . Cardiac catheterization  10/03/2009    normal LV function; 100% native RCA, circumflex, distal LAD occlusion  . Coronary angioplasty with stent placement  01/12/2013    Xience DES to SVG-RCA distal  . Carotid doppler  08/14/2009    R & L ICAs - 0-49% diameter reduction    Social History:  reports that she quit smoking about 9 months ago. Her smoking use included Cigarettes. She has a 45 pack-year smoking history. She has  never used smokeless tobacco. She reports that she uses illicit drugs (Flunitrazepam). She reports that she does not drink alcohol.  Allergies  Allergen Reactions  . Amiodarone Other (See Comments)    Drops HR too low, SOB, fatigue  . Clopidogrel Bisulfate Hives  . Dilaudid [Hydromorphone Hcl] Other (See Comments)    "I couldn't function for 2 days."  . Multaq [Dronedarone] Other (See Comments)    Heart races     Family History  Problem Relation Age of Onset  . Lung cancer Father     also CVA  . Heart disease Father   . Asthma Father   . Allergies Father   . Cancer Father   . Heart disease Mother     also CVA, MI  . Brain cancer Mother   . Heart disease Sister   . Colon cancer Neg Hx   . Lung cancer Brother 71    also ENT cancer  . Hypertension Brother 103    also aneurysm, early CAD  . Breast cancer Other     family hx     Prior to Admission medications   Medication Sig Start Date End Date Taking? Authorizing Provider  albuterol (PROVENTIL HFA;VENTOLIN HFA) 108 (90 BASE) MCG/ACT inhaler Inhale 2 puffs into the lungs every 6 (six) hours as needed for wheezing or shortness of breath.   Yes Historical Provider, MD  aspirin 81 MG EC tablet Take 81 mg by mouth daily.     Yes Historical Provider, MD  atorvastatin (LIPITOR) 80 MG tablet Take 80 mg by mouth at bedtime.   Yes Historical Provider, MD  Cholecalciferol (VITAMIN D-3) 5000 UNITS TABS Take 1 tablet by mouth daily.   Yes Historical Provider, MD  diazepam (VALIUM) 5 MG tablet Take 5 mg by mouth 2 (two) times daily.   Yes Historical Provider, MD  ezetimibe (ZETIA) 10 MG tablet Take 10 mg by mouth daily.     Yes Historical Provider, MD  famotidine (PEPCID) 20 MG tablet Take 20 mg by mouth at bedtime.   Yes Historical Provider, MD  HYDROcodone-acetaminophen (NORCO) 10-325 MG per tablet Take 1 tablet by mouth every 6 (six) hours as needed for moderate pain.    Yes Historical Provider, MD  montelukast (SINGULAIR) 10 MG tablet  Take 10 mg by mouth at bedtime.   Yes Historical Provider, MD  nitroGLYCERIN (NITROSTAT) 0.4 MG SL tablet Place 0.4 mg under the tongue every 5 (five) minutes as needed. For chest pain   Yes Historical Provider, MD  omeprazole (PRILOSEC) 20 MG capsule Take 20 mg by mouth 2 (two) times daily before a meal.   Yes Historical Provider, MD  OXYGEN-HELIUM IN Inhale 4 L into the lungs continuous.   Yes Historical Provider, MD  prasugrel (EFFIENT) 10 MG TABS tablet Take  10 mg by mouth daily.   Yes Historical Provider, MD  Umeclidinium-Vilanterol (ANORO ELLIPTA) 62.5-25 MCG/INH AEPB Inhale 1 puff into the lungs daily.   Yes Historical Provider, MD  doxycycline (VIBRA-TABS) 100 MG tablet Take 1 tablet (100 mg total) by mouth 2 (two) times daily. 02/12/14   Chesley Mires, MD   Physical Exam: Filed Vitals:   03/03/14 1215 03/03/14 1301 03/03/14 1555 03/03/14 1628  BP: 128/73 127/57 120/66   Pulse: 95 97 86 93  Temp:      TempSrc:      Resp: 24 21 21 24   SpO2: 99% 99% 100% 99%    Wt Readings from Last 3 Encounters:  01/23/14 43.954 kg (96 lb 14.4 oz)  01/01/14 43.001 kg (94 lb 12.8 oz)  12/31/13 43.364 kg (95 lb 9.6 oz)    General:  On continuous neb Eyes: PERRL, normal lids, irises & conjunctiva ENT: grossly normal hearing, lips & tongue Neck: no LAD, masses or thyromegaly Cardiovascular: RRR, no m/r/g. No LE edema. Respiratory: on continuous albuterol treatment, still wheezing Abdomen: soft, ntnd Skin: no rash or induration seen on limited exam Musculoskeletal: grossly normal tone BUE/BLE Psychiatric: grossly normal mood and affect, speech fluent and appropriate Neurologic: grossly non-focal.          Labs on Admission:  Basic Metabolic Panel:  Recent Labs Lab 03/03/14 1147  NA 138  K 3.5*  CL 97  CO2 28  GLUCOSE 93  BUN 6  CREATININE 0.51  CALCIUM 9.7   Liver Function Tests:  Recent Labs Lab 03/03/14 1147  AST 21  ALT 16  ALKPHOS 117  BILITOT 0.4  PROT 8.2  ALBUMIN  4.6   No results found for this basename: LIPASE, AMYLASE,  in the last 168 hours No results found for this basename: AMMONIA,  in the last 168 hours CBC:  Recent Labs Lab 03/03/14 1147  WBC 7.0  NEUTROABS 6.1  HGB 14.2  HCT 41.0  MCV 94.7  PLT 214   Cardiac Enzymes:  Recent Labs Lab 03/03/14 1147  TROPONINI <0.30    BNP (last 3 results)  Recent Labs  07/20/13 1030 03/03/14 1147  PROBNP 235.6* 475.8*   CBG: No results found for this basename: GLUCAP,  in the last 168 hours  Radiological Exams on Admission: Dg Chest 2 View  03/03/2014   CLINICAL DATA:  Mid sternal chest pain, shortness of breath. History of CHF, COPD.  EXAM: CHEST  2 VIEW  COMPARISON:  Chest CT 10/05/2013.  Chest x-ray 09/11/2013.  FINDINGS: Prior CABG. Heart is normal size. There is hyperinflation of the lungs compatible with COPD. No focal airspace opacities or effusions.  Postoperative changes in the lower thoracic and visualized upper lumbar spine.  IMPRESSION: COPD.  Prior CABG.  No active disease.   Electronically Signed   By: Rolm Baptise M.D.   On: 03/03/2014 13:25   Ct Head Wo Contrast  03/03/2014   CLINICAL DATA:  Acute onset severe headache ; history of lung carcinoma  EXAM: CT HEAD WITHOUT CONTRAST  TECHNIQUE: Contiguous axial images were obtained from the base of the skull through the vertex without intravenous contrast.  COMPARISON:  May 23, 2013  FINDINGS: The ventricles are normal in size and configuration. The left lateral ventricle is slightly larger than the right lateral ventricle, a stable finding.  There is no mass, hemorrhage, extra-axial fluid collection, or midline shift. Gray-white compartments appear normal. There is no demonstrable acute infarct. Bony calvarium appears intact. There is  opacification of several inferior mastoid air cells bilaterally, stable. The remaining mastoid air cells are clear. No air-fluid levels in the mastoid regions.  IMPRESSION: No intracranial mass,  hemorrhage, or focal gray -white compartment lesions/acute infarct. Mild mastoid air cell thickening inferiorly on both sides, stable. Other mastoids are clear.   Electronically Signed   By: Lowella Grip M.D.   On: 03/03/2014 15:59    EKG: Independently reviewed. Mild sinus tach with arrythmia  Assessment/Plan Active Problems:   COPD exacerbation   Transient blindness of right eye   COPD exacerbation- nebs, steroids, abx, encourage smoking cessation- on 3L O2 at home  Tobacco abuse- encourage cessation, does not want a patch  Transient blindness of right eye- MRI/carotids, tele, FLP, TIA work up (lasted about 15-20 minutes)    Code Status: full DVT Prophylaxis: Family Communication: daughter at bedside Disposition Plan: admit  Time spent: 76 min  Eulogio Bear [facility] Pager [phone]

## 2014-03-03 NOTE — ED Notes (Signed)
CT called and informed patient ready for CT

## 2014-03-03 NOTE — ED Notes (Addendum)
Per EMS, patient has had chest congestion, SOB, and productive cough x3 days, yellow sputum. Hx of asthma and lung cancer. Given breathing treatment of 5 albuterol and .5 of Atrovent by EMS.

## 2014-03-04 ENCOUNTER — Inpatient Hospital Stay (HOSPITAL_COMMUNITY): Payer: Medicaid Other

## 2014-03-04 DIAGNOSIS — G459 Transient cerebral ischemic attack, unspecified: Secondary | ICD-10-CM

## 2014-03-04 DIAGNOSIS — G453 Amaurosis fugax: Secondary | ICD-10-CM

## 2014-03-04 DIAGNOSIS — F411 Generalized anxiety disorder: Secondary | ICD-10-CM

## 2014-03-04 DIAGNOSIS — I25810 Atherosclerosis of coronary artery bypass graft(s) without angina pectoris: Secondary | ICD-10-CM

## 2014-03-04 LAB — BASIC METABOLIC PANEL
ANION GAP: 13 (ref 5–15)
BUN: 9 mg/dL (ref 6–23)
CALCIUM: 9.1 mg/dL (ref 8.4–10.5)
CO2: 27 mEq/L (ref 19–32)
CREATININE: 0.51 mg/dL (ref 0.50–1.10)
Chloride: 101 mEq/L (ref 96–112)
GFR calc non Af Amer: >90 mL/min (ref >90)
Glucose, Bld: 123 mg/dL — ABNORMAL HIGH (ref 70–99)
Potassium: 4.1 mEq/L (ref 3.7–5.3)
SODIUM: 141 meq/L (ref 137–147)

## 2014-03-04 LAB — LIPID PANEL
Cholesterol: 233 mg/dL — ABNORMAL HIGH (ref 0–200)
HDL: 84 mg/dL (ref >39)
LDL Cholesterol: 138 mg/dL — ABNORMAL HIGH (ref 0–99)
Total CHOL/HDL Ratio: 2.8 RATIO
Triglycerides: 57 mg/dL (ref <150)
VLDL: 11 mg/dL (ref 0–40)

## 2014-03-04 LAB — CBC
HCT: 36.6 % (ref 36.0–46.0)
HEMOGLOBIN: 12.2 g/dL (ref 12.0–15.0)
MCH: 32.1 pg (ref 26.0–34.0)
MCHC: 33.3 g/dL (ref 30.0–36.0)
MCV: 96.3 fL (ref 78.0–100.0)
Platelets: 188 10*3/uL (ref 150–400)
RBC: 3.8 MIL/uL — ABNORMAL LOW (ref 3.87–5.11)
RDW: 13 % (ref 11.5–15.5)
WBC: 3.9 10*3/uL — AB (ref 4.0–10.5)

## 2014-03-04 LAB — GLUCOSE, CAPILLARY
Glucose-Capillary: 194 mg/dL — ABNORMAL HIGH (ref 70–99)
Glucose-Capillary: 144 mg/dL — ABNORMAL HIGH (ref 70–99)
Glucose-Capillary: 125 mg/dL — ABNORMAL HIGH (ref 70–99)
Glucose-Capillary: 131 mg/dL — ABNORMAL HIGH (ref 70–99)

## 2014-03-04 LAB — HEMOGLOBIN A1C
Hgb A1c MFr Bld: 5.3 % (ref <5.7)
Hgb A1c MFr Bld: 5.4 % (ref <5.7)
Mean Plasma Glucose: 105 mg/dL (ref <117)
Mean Plasma Glucose: 108 mg/dL (ref <117)

## 2014-03-04 MED ORDER — FLUTICASONE PROPIONATE 50 MCG/ACT NA SUSP
1.0000 | Freq: Every day | NASAL | Status: DC
  Filled 2014-03-04: qty 16

## 2014-03-04 MED ORDER — GUAIFENESIN-DM 100-10 MG/5ML PO SYRP: 5.0000 mL | ORAL_SOLUTION | ORAL | Status: DC | PRN

## 2014-03-04 MED ORDER — INFLUENZA VAC SPLIT QUAD 0.5 ML IM SUSY
0.5000 mL | PREFILLED_SYRINGE | INTRAMUSCULAR | Status: AC
  Administered 2014-03-05: 0.5 mL via INTRAMUSCULAR
  Filled 2014-03-04: qty 0.5

## 2014-03-04 MED ORDER — IPRATROPIUM-ALBUTEROL 0.5-2.5 (3) MG/3ML IN SOLN
3.0000 mL | RESPIRATORY_TRACT | Status: DC
  Administered 2014-03-04 – 2014-03-05 (×4): 3 mL via RESPIRATORY_TRACT
  Filled 2014-03-04 (×4): qty 3

## 2014-03-04 MED ORDER — METHYLPREDNISOLONE SODIUM SUCC 125 MG IJ SOLR
60.0000 mg | Freq: Four times a day (QID) | INTRAMUSCULAR | Status: DC
  Administered 2014-03-04 – 2014-03-05 (×4): 60 mg via INTRAVENOUS
  Filled 2014-03-04 (×4): qty 2

## 2014-03-04 MED ORDER — MORPHINE SULFATE 2 MG/ML IJ SOLN
1.0000 mg | INTRAMUSCULAR | Status: DC | PRN
  Administered 2014-03-04 – 2014-03-05 (×4): 1 mg via INTRAVENOUS
  Filled 2014-03-04 (×4): qty 1

## 2014-03-04 MED ORDER — LORATADINE 10 MG PO TABS
10.0000 mg | ORAL_TABLET | Freq: Every day | ORAL | Status: DC
  Administered 2014-03-04 – 2014-03-05 (×2): 10 mg via ORAL
  Filled 2014-03-04 (×2): qty 1

## 2014-03-04 MED ORDER — BENZONATATE 100 MG PO CAPS
100.0000 mg | ORAL_CAPSULE | Freq: Three times a day (TID) | ORAL | Status: DC
  Administered 2014-03-04 – 2014-03-05 (×4): 100 mg via ORAL
  Filled 2014-03-04 (×4): qty 1

## 2014-03-04 MED ORDER — BUDESONIDE-FORMOTEROL FUMARATE 80-4.5 MCG/ACT IN AERO
2.0000 | INHALATION_SPRAY | Freq: Two times a day (BID) | RESPIRATORY_TRACT | Status: DC
  Administered 2014-03-05: 2 via RESPIRATORY_TRACT
  Filled 2014-03-04: qty 6.9

## 2014-03-04 MED ORDER — ENSURE COMPLETE PO LIQD
237.0000 mL | Freq: Three times a day (TID) | ORAL | Status: DC
  Administered 2014-03-04 – 2014-03-05 (×2): 237 mL via ORAL

## 2014-03-04 NOTE — Progress Notes (Signed)
Pt's HR spikes up to the 130s and 140s at times. Pt asymptomatic. Pt told to call bedside RN if she does get symptomatic.

## 2014-03-04 NOTE — Progress Notes (Signed)
Patient ID: TAYLI BUCH  female  MWU:[id number]    DOB: [birth year]    DOA: 03/03/2014  PCP: Tivis Ringer, MD  History of present illness Patient is a 63 year old female with history of CAD/CABG, hypertension, GERD, hyperlipidemia, COPD, chronic respiratory failure, nicotine abuse presented with shortness of breath, coughing and wheezing. Patient also reported 20 minute transient blindness in her right eye described as "shade coming down" which has resolved. Patient was admitted for COPD exacerbation and TIA workup.  Assessment/Plan: Principal Problem:   COPD exacerbation: Still tight and wheezing - Placed on scheduled nebs, Symbicort, IV steroids, IV Levaquin - Continue O2 via nasal cannula  Active Problems:   Transient blindness of right eye? TIA - Follow MRI of the brain/MRA, carotid Dopplers    Hyperlipidemia - Lipid panel showed cholesterol 233, LDL 138 -Continue Lipitor 80 mg, Zetia     Anxiety state - Patient is somewhat anxious, husband recently died in November 13, 2022 this year from stroke - Currently on Valium    GERD - Continue PPI    CAD (coronary artery disease), autologous vein bypass graft, cath 01/12/13 with Xience DES to VG-RCA - Continue aspirin, statin, effient  Severe protein calorie malnutrition - Will add nutritional supplements, also nutrition consult  DVT Prophylaxis: on effient  Code Status:Full code   Family Communication:  Disposition:Hopefully tomorrow   Consultants:  None   Procedures:  None   Antibiotics:  Levaquin    Subjective: Patient seen and examined, anxious and teary as her husband had died in 11-13-22 from acute CVA on the same floor, still having coughing and wheezing. Right-sided vision issues resolved   Objective: Weight change:   Intake/Output Summary (Last 24 hours) at 03/04/14 1011 Last data filed at 03/04/14 0821  Gross per 24 hour  Intake      0 ml  Output      1 ml  Net     -1 ml   Blood pressure 121/59,  pulse 88, temperature 99.2 F (37.3 C), temperature source Oral, resp. rate 20, height 5\' 6"  (1.676 m), weight 47.945 kg (105 lb 11.2 oz), SpO2 97.00%.  Physical Exam: General: Alert and awake, oriented x3, not in any acute distress. CVS: S1-S2 clear, no murmur rubs or gallops Chest:  bilateral expiratory wheezing  Abdomen: soft nontender, nondistended, normal bowel sounds  Extremities: no cyanosis, clubbing or edema noted bilaterally Neuro: Cranial nerves II-XII intact, no focal neurological deficits  Lab Results: Basic Metabolic Panel:  Recent Labs Lab 03/03/14 1147 03/04/14 0700  NA 138 141  K 3.5* 4.1  CL 97 101  CO2 28 27  GLUCOSE 93 123*  BUN 6 9  CREATININE 0.51 0.51  CALCIUM 9.7 9.1   Liver Function Tests:  Recent Labs Lab 03/03/14 1147  AST 21  ALT 16  ALKPHOS 117  BILITOT 0.4  PROT 8.2  ALBUMIN 4.6   No results found for this basename: LIPASE, AMYLASE,  in the last 168 hours No results found for this basename: AMMONIA,  in the last 168 hours CBC:  Recent Labs Lab 03/03/14 1147 03/04/14 0700  WBC 7.0 3.9*  NEUTROABS 6.1  --   HGB 14.2 12.2  HCT 41.0 36.6  MCV 94.7 96.3  PLT 214 188   Cardiac Enzymes:  Recent Labs Lab 03/03/14 1147  TROPONINI <0.30   BNP: No components found with this basename: POCBNP,  CBG:  Recent Labs Lab 03/03/14 2121 03/04/14 0647  GLUCAP 104* 194*     Micro Results:  No results found for this or any previous visit (from the past 240 hour(s)).  Studies/Results: Dg Chest 2 View  03/03/2014   CLINICAL DATA:  Mid sternal chest pain, shortness of breath. History of CHF, COPD.  EXAM: CHEST  2 VIEW  COMPARISON:  Chest CT 10/05/2013.  Chest x-ray 09/11/2013.  FINDINGS: Prior CABG. Heart is normal size. There is hyperinflation of the lungs compatible with COPD. No focal airspace opacities or effusions.  Postoperative changes in the lower thoracic and visualized upper lumbar spine.  IMPRESSION: COPD.  Prior CABG.  No  active disease.   Electronically Signed   By: Rolm Baptise M.D.   On: 03/03/2014 13:25   Ct Head Wo Contrast  03/03/2014   CLINICAL DATA:  Acute onset severe headache ; history of lung carcinoma  EXAM: CT HEAD WITHOUT CONTRAST  TECHNIQUE: Contiguous axial images were obtained from the base of the skull through the vertex without intravenous contrast.  COMPARISON:  May 23, 2013  FINDINGS: The ventricles are normal in size and configuration. The left lateral ventricle is slightly larger than the right lateral ventricle, a stable finding.  There is no mass, hemorrhage, extra-axial fluid collection, or midline shift. Gray-white compartments appear normal. There is no demonstrable acute infarct. Bony calvarium appears intact. There is opacification of several inferior mastoid air cells bilaterally, stable. The remaining mastoid air cells are clear. No air-fluid levels in the mastoid regions.  IMPRESSION: No intracranial mass, hemorrhage, or focal gray -white compartment lesions/acute infarct. Mild mastoid air cell thickening inferiorly on both sides, stable. Other mastoids are clear.   Electronically Signed   By: Lowella Grip M.D.   On: 03/03/2014 15:59    Medications: Scheduled Meds: . aspirin EC  81 mg Oral Daily  . atorvastatin  80 mg Oral QHS  . benzonatate  100 mg Oral TID  . budesonide-formoterol  2 puff Inhalation BID  . diazepam  5 mg Oral BID  . enoxaparin (LOVENOX) injection  40 mg Subcutaneous Q24H  . ezetimibe  10 mg Oral Daily  . famotidine  20 mg Oral QHS  . fluticasone  1 spray Each Nare Daily  . [START ON 03/05/2014] Influenza vac split quadrivalent PF  0.5 mL Intramuscular Tomorrow-1000  . ipratropium-albuterol  3 mL Nebulization Q4H  . levofloxacin (LEVAQUIN) IV  750 mg Intravenous Q24H  . loratadine  10 mg Oral Daily  . methylPREDNISolone (SOLU-MEDROL) injection  60 mg Intravenous Q6H  . montelukast  10 mg Oral QHS  . pantoprazole  40 mg Oral Daily  . prasugrel  10 mg  Oral Daily  . Umeclidinium-Vilanterol  1 puff Inhalation Daily      LOS: 1 day   Delvina Mizzell M.D. [facility] 03/04/2014, 10:11 AM Pager: [phone]  If 7PM-7AM, please contact night-coverage [email or web address] Password [email or web address]

## 2014-03-04 NOTE — Progress Notes (Signed)
UR complete.  Demia Viera RN, MSN 

## 2014-03-04 NOTE — Progress Notes (Addendum)
VASCULAR LAB PRELIMINARY  PRELIMINARY  PRELIMINARY  PRELIMINARY  Carotid duplex completed.    Preliminary report:  Right:  40-59% internal carotid artery stenosis.  Left:  60-79% internal carotid artery stenosis.   Bilateral:  ECA stenosis.  Vertebral artery flow is antegrade.      Brennen Camper, RVT 03/04/2014, 12:38 PM

## 2014-03-04 NOTE — Progress Notes (Signed)
Patient arrived to 4N from ED. Patient oriented to room and unit. VSS. Patient lying in bed, call light is within reach. Will continue to monitor patient. Burnell Blanks, RN

## 2014-03-05 ENCOUNTER — Inpatient Hospital Stay (HOSPITAL_COMMUNITY): Payer: Medicaid Other

## 2014-03-05 DIAGNOSIS — H539 Unspecified visual disturbance: Secondary | ICD-10-CM

## 2014-03-05 LAB — GLUCOSE, CAPILLARY
Glucose-Capillary: 142 mg/dL — ABNORMAL HIGH (ref 70–99)
Glucose-Capillary: 182 mg/dL — ABNORMAL HIGH (ref 70–99)

## 2014-03-05 MED ORDER — LEVOFLOXACIN 750 MG PO TABS: 750.0000 mg | ORAL_TABLET | Freq: Every evening | ORAL | Status: DC

## 2014-03-05 MED ORDER — LORAZEPAM 0.5 MG PO TABS: 0.5000 mg | ORAL_TABLET | Freq: Once | ORAL | Status: DC

## 2014-03-05 MED ORDER — LORATADINE 10 MG PO TABS: 10.0000 mg | ORAL_TABLET | Freq: Every day | ORAL | Status: DC | PRN

## 2014-03-05 MED ORDER — LEVOFLOXACIN 750 MG PO TABS: 750.0000 mg | ORAL_TABLET | Freq: Every day | ORAL | Status: DC

## 2014-03-05 MED ORDER — LEVALBUTEROL HCL 0.63 MG/3ML IN NEBU: 0.6300 mg | INHALATION_SOLUTION | Freq: Four times a day (QID) | RESPIRATORY_TRACT | Status: DC

## 2014-03-05 MED ORDER — PREDNISONE 20 MG PO TABS: 40.0000 mg | ORAL_TABLET | Freq: Every day | ORAL | Status: DC

## 2014-03-05 MED ORDER — PREDNISONE 20 MG PO TABS
60.0000 mg | ORAL_TABLET | Freq: Once | ORAL | Status: AC
  Administered 2014-03-05: 60 mg via ORAL
  Filled 2014-03-05: qty 3

## 2014-03-05 MED ORDER — LORAZEPAM 2 MG/ML IJ SOLN
0.5000 mg | Freq: Once | INTRAMUSCULAR | Status: AC
  Administered 2014-03-05: 0.5 mg via INTRAVENOUS

## 2014-03-05 MED ORDER — IOHEXOL 350 MG/ML SOLN
75.0000 mL | Freq: Once | INTRAVENOUS | Status: AC | PRN
  Administered 2014-03-05: 75 mL via INTRAVENOUS

## 2014-03-05 MED ORDER — PRASUGREL HCL 5 MG PO TABS
5.0000 mg | ORAL_TABLET | Freq: Every day | ORAL | Status: DC
  Administered 2014-03-05: 5 mg via ORAL
  Filled 2014-03-05: qty 1

## 2014-03-05 MED ORDER — FLUTICASONE PROPIONATE 50 MCG/ACT NA SUSP: 1.0000 | Freq: Every day | NASAL | Status: DC | PRN

## 2014-03-05 MED ORDER — BENZONATATE 100 MG PO CAPS: 100.0000 mg | ORAL_CAPSULE | Freq: Three times a day (TID) | ORAL | Status: DC | PRN

## 2014-03-05 MED ORDER — ENSURE COMPLETE PO LIQD
237.0000 mL | Freq: Three times a day (TID) | ORAL | Status: DC
  Administered 2014-03-05: 237 mL via ORAL

## 2014-03-05 MED ORDER — PREDNISONE 10 MG PO TABS: ORAL_TABLET | ORAL | Status: DC

## 2014-03-05 MED ORDER — LORAZEPAM 2 MG/ML IJ SOLN
INTRAMUSCULAR | Status: AC
  Filled 2014-03-05: qty 1

## 2014-03-05 MED ORDER — GUAIFENESIN-DM 100-10 MG/5ML PO SYRP: 5.0000 mL | ORAL_SOLUTION | ORAL | Status: DC | PRN

## 2014-03-05 NOTE — Progress Notes (Signed)
Discharge orders received, pt for discharge home today. IV D/C,   D/C instructions and Rx given with verbalized understanding.  Pt to f/u with Dr. Gwenlyn Found at [place] within 10 days.  Family at bedside to assist pt with discharge. Staff brought pt downstairs via wheelchair.

## 2014-03-05 NOTE — Consult Note (Signed)
[facility] [place]  Reason for Consult: Transient blindness of right eye Referring Physician:  [facility] MRN #:  [id number]  History of Present Illness: This is a 63 y.o. female who reported a 20 minute episode of transient blindness, described as a "shade" coming down over one eye on 03/03/14. Her symptoms have since resolved. She denies any weakness or numbness in her extremities, facial drooping, expressive/receptive aphasia. She has no prior history of stroke or TIA symptoms. She was admitted for COPD exacerbation and TIA workup. The patient's risks factors for carotid disease include: CAD s/p CABG and stent placment, hypertension, current smoker. Her primary cardiologist is Dr. Gwenlyn Found.   She has a past medical history of non small cell lung cancer with radiation to the right lung. She also has advanced COPD using oxygen as needed and moderate LV dysfunction. She has hypertension not medically managed. She has hyperlipidemia managed with a statin and zetia. She does not have diabetes. She takes a daily aspirin and effient. She is allergic to plavix.   Past Medical History  Diagnosis Date  . Acute myocardial infarction, unspecified site, episode of care unspecified 2011    VF arrest. Ruled in, cath with no clear culprit lesion, inferior WMA on LV gram, medical therapy  . Esophageal dysmotility   . Hemorrhoids   . Esophageal stricture   . Diaphragmatic hernia without mention of obstruction or gangrene   . 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 CAD (coronary artery disease)     CABG'96., Xience DES -VG-RCA (01/12/13)  . Peripheral neuropathy   . Hyperlipidemia   . Peptic ulcer, unspecified site, unspecified as acute or chronic, without mention of hemorrhage, perforation, or obstruction   . Depressive disorder, not elsewhere classified   . Anxiety state, unspecified   . COPD (chronic obstructive pulmonary disease)   . GERD (gastroesophageal reflux  disease)   . Candida esophagitis     on multiple occasions  . C. difficile diarrhea   . PVD (peripheral vascular disease)   . Headache(784.0)   . Weight loss 06/13/2011    scheduled here by family doctor  . CHF (congestive heart failure) 2011  . Asthma   . Hypertension   . History of nuclear stress test 07/14/2010    dipyriadmole; normal pattern of perfusion, low risk   . History of tobacco use     quit 05/2009  . Non-small cell carcinoma of lung, stage 3     Dr. Roxan Hockey  . History of radiation therapy 02/28/2012-04/14/2012    right lung   Past Surgical History  Procedure Laterality Date  . Tonsillectomy  1974  . Fracture lt jaw  1976  . Breast surgery  1969, 1970    bilateral  . Epigastric hernia repair  2008    Dr Roxan Hockey  . Coronary artery bypass graft  02/01/1994    left internal mammary to LAD, vein graft to diagonal/post descending, post lateral, marginals, circumfle, diagonal  . Carpal tunnel release    . Total abdominal hysterectomy  1998  . Laminectomy    . Subclavian vein angioplasty / stenting Left   . Transthoracic echocardiogram  08/14/2009    EF=>55%, normal LV systolic function, mild hypokinesis of basal segments of inferolateral & anterolater walls; mild MR/TR; AV mildly sclerotic  . Coronary angioplasty with stent placement  08/29/2000    stent to RCA (Dr. Corky Downs)  . Cardiac catheterization  01/17/2002    patent grafts (Dr. Marella Chimes)  .  Cardiac catheterization  02/13/2004    significant L main & 3 vessel CAD, patents grafts, mild distal aortic disease (Dr. Gerrie Nordmann)  . Cardiac catheterization  07/14/2005    patent grafts, normal LV function (Dr. Adora Fridge)  . Cardiac catheterization  09/26/2008    patent grafts, normal LV function (Dr. Adora Fridge)  . Cardiac catheterization  05/31/2009    patent grafts, inferior wall motion abnormality (Dr. Adora Fridge)  . Cardiac catheterization  10/03/2009    normal LV function; 100% native RCA, circumflex, distal LAD  occlusion  . Coronary angioplasty with stent placement  01/12/2013    Xience DES to SVG-RCA distal  . Carotid doppler  08/14/2009    R & L ICAs - 0-49% diameter reduction     Allergies  Allergen Reactions  . Amiodarone Other (See Comments)    Drops HR too low, SOB, fatigue  . Clopidogrel Bisulfate Hives  . Dilaudid [Hydromorphone Hcl] Other (See Comments)    "I couldn't function for 2 days."  . Multaq [Dronedarone] Other (See Comments)    Heart races     Prior to Admission medications   Medication Sig Start Date End Date Taking? Authorizing Provider  albuterol (PROVENTIL HFA;VENTOLIN HFA) 108 (90 BASE) MCG/ACT inhaler Inhale 2 puffs into the lungs every 6 (six) hours as needed for wheezing or shortness of breath.   Yes Historical Provider, MD  aspirin 81 MG EC tablet Take 81 mg by mouth daily.     Yes Historical Provider, MD  atorvastatin (LIPITOR) 80 MG tablet Take 80 mg by mouth at bedtime.   Yes Historical Provider, MD  Cholecalciferol (VITAMIN D-3) 5000 UNITS TABS Take 1 tablet by mouth daily.   Yes Historical Provider, MD  diazepam (VALIUM) 5 MG tablet Take 5 mg by mouth 2 (two) times daily.   Yes Historical Provider, MD  ezetimibe (ZETIA) 10 MG tablet Take 10 mg by mouth daily.     Yes Historical Provider, MD  famotidine (PEPCID) 20 MG tablet Take 20 mg by mouth at bedtime.   Yes Historical Provider, MD  HYDROcodone-acetaminophen (NORCO) 10-325 MG per tablet Take 1 tablet by mouth every 6 (six) hours as needed for moderate pain.    Yes Historical Provider, MD  montelukast (SINGULAIR) 10 MG tablet Take 10 mg by mouth at bedtime.   Yes Historical Provider, MD  nitroGLYCERIN (NITROSTAT) 0.4 MG SL tablet Place 0.4 mg under the tongue every 5 (five) minutes as needed. For chest pain   Yes Historical Provider, MD  omeprazole (PRILOSEC) 20 MG capsule Take 20 mg by mouth 2 (two) times daily before a meal.   Yes Historical Provider, MD  OXYGEN-HELIUM IN Inhale 4 L into the lungs  continuous.   Yes Historical Provider, MD  prasugrel (EFFIENT) 10 MG TABS tablet Take 10 mg by mouth daily.   Yes Historical Provider, MD  Umeclidinium-Vilanterol (ANORO ELLIPTA) 62.5-25 MCG/INH AEPB Inhale 1 puff into the lungs daily.   Yes Historical Provider, MD  benzonatate (TESSALON) 100 MG capsule Take 1 capsule (100 mg total) by mouth 3 (three) times daily as needed for cough. 03/05/14   Ripudeep Krystal Eaton, MD  doxycycline (VIBRA-TABS) 100 MG tablet Take 1 tablet (100 mg total) by mouth 2 (two) times daily. 02/12/14   Chesley Mires, MD  fluticasone (FLONASE) 50 MCG/ACT nasal spray Place 1 spray into both nostrils daily as needed for allergies or rhinitis. 03/05/14   Ripudeep Krystal Eaton, MD  guaiFENesin-dextromethorphan (ROBITUSSIN DM) 100-10 MG/5ML syrup Take  5 mLs by mouth every 4 (four) hours as needed for cough. 03/05/14   Ripudeep Krystal Eaton, MD  levofloxacin (LEVAQUIN) 750 MG tablet Take 1 tablet (750 mg total) by mouth daily. X 1 week 03/05/14   Ripudeep Krystal Eaton, MD  loratadine (CLARITIN) 10 MG tablet Take 1 tablet (10 mg total) by mouth daily as needed for allergies. 03/05/14   Ripudeep Krystal Eaton, MD  predniSONE (DELTASONE) 10 MG tablet Prednisone dosing: Take  Prednisone 40mg  (4 tabs) x 3 days, then taper to 30mg  (3 tabs) x 3 days, then 20mg  (2 tabs) x 3days, then 10mg  (1 tab) x 3days, then OFF.  Dispense:  30 tabs, refills: None 03/06/14   Ripudeep Krystal Eaton, MD    History   Social History  . Marital Status: Married    Spouse Name: N/A    Number of Children: 2  . Years of Education: N/A   Occupational History  . disabled     back problems   Social History Main Topics  . Smoking status: Former Smoker -- 1.50 packs/day for 30 years    Types: Cigarettes    Quit date: 05/24/2013  . Smokeless tobacco: Never Used  . Alcohol Use: No  . Drug Use: Yes    Special: Flunitrazepam  . Sexual Activity: Not on file   Other Topics Concern  . Not on file   Social History Narrative   Married with 2  children, husband has Alzheimer's dementia, retired now, previously Corporate treasurer, Quit smoking in [place] History  Problem Relation Age of Onset  . Lung cancer Father     also CVA  . Heart disease Father   . Asthma Father   . Allergies Father   . Cancer Father   . Heart disease Mother     also CVA, MI  . Brain cancer Mother   . Heart disease Sister   . Colon cancer Neg Hx   . Lung cancer Brother 15    also ENT cancer  . Hypertension Brother 60    also aneurysm, early CAD  . Breast cancer Other     family hx    ROS: [x]  Positive   [ ]  Negative   [ ]  All sytems reviewed and are negative  Cardiovascular: []  chest pain/pressure []  palpitations []  SOB lying flat []  DOE []  pain in legs while walking []  pain in legs at rest []  pain in legs at night []  non-healing ulcers []  hx of DVT []  swelling in legs  Pulmonary: [x]  productive cough [x]  asthma/wheezing []  home O2  Neurologic: []  weakness in []  arms []  legs []  numbness in []  arms []  legs []  hx of CVA []  mini stroke [] difficulty speaking or slurred speech [x]  temporary loss of vision in one eye []  dizziness  Hematologic: []  hx of cancer []  bleeding problems []  problems with blood clotting easily  Endocrine:   []  diabetes []  thyroid disease  GI []  vomiting blood []  blood in stool  GU: []  CKD/renal failure []  HD--[]  M/W/F or []  T/T/S []  burning with urination []  blood in urine  Psychiatric: []  anxiety []  depression  Musculoskeletal: []  arthritis []  joint pain  Integumentary: []  rashes []  ulcers  Constitutional: []  fever []  chills   Physical Examination  Filed Vitals:   03/05/14 0554  BP: 95/50  Pulse: 68  Temp: 97.9 F (36.6 C)  Resp: 18   Body mass index is 17.07 kg/(m^2).  General:  Thin ill  appearing female sitting on side of bed in NAD Gait: Not observed HENT: WNL, normocephalic Eyes: Pupils equal. Normal vision.  Pulmonary: normal non-labored  breathing, some wheezing.  Cardiac: regular, without  Murmurs, rubs or gallops; without carotid bruits Abdomen: soft, NT/ND, no masses Skin: without rashes, without ulcers  Vascular Exam/Pulses: palpable 2+ radial pulses. Feet are warm bilaterally.  Extremities: without ischemic changes, without Gangrene , without cellulitis; without open wounds;  Musculoskeletal: no muscle wasting or atrophy, 5/5 strength upper and lower extremities bilaterally.   Neurologic: A&O X 3; Appropriate Affect ; SENSATION: normal; MOTOR FUNCTION:  moving all extremities equally. Speech is fluent/normal  CBC    Component Value Date/Time   WBC 3.9* 03/04/2014 0700   WBC 7.0 10/05/2013 0906   RBC 3.80* 03/04/2014 0700   RBC 4.11 10/05/2013 0906   HGB 12.2 03/04/2014 0700   HGB 13.4 10/05/2013 0906   HCT 36.6 03/04/2014 0700   HCT 39.7 10/05/2013 0906   PLT 188 03/04/2014 0700   PLT 259 10/05/2013 0906   MCV 96.3 03/04/2014 0700   MCV 96.7 10/05/2013 0906   MCH 32.1 03/04/2014 0700   MCH 32.6 10/05/2013 0906   MCHC 33.3 03/04/2014 0700   MCHC 33.7 10/05/2013 0906   RDW 13.0 03/04/2014 0700   RDW 13.3 10/05/2013 0906   LYMPHSABS 0.4* 03/03/2014 1147   LYMPHSABS 0.9 10/05/2013 0906   MONOABS 0.5 03/03/2014 1147   MONOABS 0.5 10/05/2013 0906   EOSABS 0.0 03/03/2014 1147   EOSABS 0.0 10/05/2013 0906   BASOSABS 0.0 03/03/2014 1147   BASOSABS 0.0 10/05/2013 0906    BMET    Component Value Date/Time   NA 141 03/04/2014 0700   NA 142 10/05/2013 0906   K 4.1 03/04/2014 0700   K 4.2 10/05/2013 0906   CL 101 03/04/2014 0700   CL 104 11/07/2012 0839   CO2 27 03/04/2014 0700   CO2 27 10/05/2013 0906   GLUCOSE 123* 03/04/2014 0700   GLUCOSE 85 10/05/2013 0906   GLUCOSE 92 11/07/2012 0839   BUN 9 03/04/2014 0700   BUN 11.5 10/05/2013 0906   CREATININE 0.51 03/04/2014 0700   CREATININE 0.7 10/05/2013 0906   CALCIUM 9.1 03/04/2014 0700   CALCIUM 10.3 10/05/2013 0906   GFRNONAA >90 03/04/2014 0700   GFRAA >90 03/04/2014  0700    COAGS: Lab Results  Component Value Date   INR 1.10 07/20/2013   INR 1.12 01/12/2013   INR 1.10 02/07/2012     Non-Invasive Vascular Imaging:   Carotid duplex (03/05/2014) Right: 40-59% ICA stenosis Left: 60-79% ICA stenosis  Statin:  Yes.   Beta Blocker:  No. Aspirin:  Yes.   ACEI:  No. ARB:  No. Other antiplatelets/anticoagulants:  Yes.     ASSESSMENT: This is a 63 y.o. female with recent episode of amaurosis fugax of the right eye.   PLAN: Carotid duplex revealing right ICA stenosis 40-59%, left 60-79%.  Patient is adamant about having carotid stenting and is against open carotid endarterectomy. CT angio neck report pending. Will review scans with Dr. Trula Slade to see whether she is a candidate for stenting. Given her cardiac and pulmonary history, she will need clearance if CEA is pursued. Her primary cardiologist is Dr. Gwenlyn Found.  She is also anxious to go home today. Dr. Trula Slade to see patient.    Virgina Jock, PA-C [facility] Office: [phone] Pager: [phone]   The patient wanted to be discharged before I was finished in the operating room, therefore  she left before I had the chance to see. Her.  I reviewed her CTA of the neck which I requested, given her 40-59% right ICA stenosis by u/s.  She told my PA that she only wanted a stent and not endarterectomy.  She is a patient of Dr. Gwenlyn Found, and therefore, I will defer the decision making to him.   [name]

## 2014-03-05 NOTE — Discharge Summary (Signed)
Physician Discharge Summary  Patient ID: [name] MRN: [id number] DOB/AGE: [birth year] 63 y.o.  Admit date: 03/03/2014 Discharge date: 03/05/2014  Primary Care Physician:  Tivis Ringer, MD  Discharge Diagnoses:    . COPD exacerbation . Transient blindness of right eye, TIA    Bilateral carotid disease . CAD (coronary artery disease), autologous vein bypass graft, cath 01/12/13 with Xience DES to VG-RCA . GERD . Anxiety state . Hyperlipidemia  Consults:  Vascular surgery, Dr Trula Slade   Recommendations for Outpatient Follow-up:  Patient did not stay for vascular surgery follow-up by Dr Trula Slade, she is strongly advised to follow-up with Dr Gwenlyn Found, her primary cardiologist in 10days.   Allergies:   Allergies  Allergen Reactions  . Amiodarone Other (See Comments)    Drops HR too low, SOB, fatigue  . Clopidogrel Bisulfate Hives  . Dilaudid [Hydromorphone Hcl] Other (See Comments)    "I couldn't function for 2 days."  . Multaq [Dronedarone] Other (See Comments)    Heart races      Discharge Medications:   Medication List    STOP taking these medications       doxycycline 100 MG tablet  Commonly known as:  VIBRA-TABS      TAKE these medications       albuterol 108 (90 BASE) MCG/ACT inhaler  Commonly known as:  PROVENTIL HFA;VENTOLIN HFA  Inhale 2 puffs into the lungs every 6 (six) hours as needed for wheezing or shortness of breath.     ANORO ELLIPTA 62.5-25 MCG/INH Aepb  Generic drug:  Umeclidinium-Vilanterol  Inhale 1 puff into the lungs daily.     aspirin 81 MG EC tablet  Take 81 mg by mouth daily.     atorvastatin 80 MG tablet  Commonly known as:  LIPITOR  Take 80 mg by mouth at bedtime.     benzonatate 100 MG capsule  Commonly known as:  TESSALON  Take 1 capsule (100 mg total) by mouth 3 (three) times daily as needed for cough.     diazepam 5 MG tablet  Commonly known as:  VALIUM  Take 5 mg by mouth 2 (two) times daily.     ezetimibe 10  MG tablet  Commonly known as:  ZETIA  Take 10 mg by mouth daily.     famotidine 20 MG tablet  Commonly known as:  PEPCID  Take 20 mg by mouth at bedtime.     fluticasone 50 MCG/ACT nasal spray  Commonly known as:  FLONASE  Place 1 spray into both nostrils daily as needed for allergies or rhinitis.     guaiFENesin-dextromethorphan 100-10 MG/5ML syrup  Commonly known as:  ROBITUSSIN DM  Take 5 mLs by mouth every 4 (four) hours as needed for cough.     HYDROcodone-acetaminophen 10-325 MG per tablet  Commonly known as:  NORCO  Take 1 tablet by mouth every 6 (six) hours as needed for moderate pain.     levofloxacin 750 MG tablet  Commonly known as:  LEVAQUIN  Take 1 tablet (750 mg total) by mouth daily. X 1 week     loratadine 10 MG tablet  Commonly known as:  CLARITIN  Take 1 tablet (10 mg total) by mouth daily as needed for allergies.     montelukast 10 MG tablet  Commonly known as:  SINGULAIR  Take 10 mg by mouth at bedtime.     nitroGLYCERIN 0.4 MG SL tablet  Commonly known as:  NITROSTAT  Place 0.4 mg under the tongue every  5 (five) minutes as needed. For chest pain     omeprazole 20 MG capsule  Commonly known as:  PRILOSEC  Take 20 mg by mouth 2 (two) times daily before a meal.     OXYGEN  Inhale 4 L into the lungs continuous.     prasugrel 10 MG Tabs tablet  Commonly known as:  EFFIENT  Take 10 mg by mouth daily.     predniSONE 10 MG tablet  Commonly known as:  DELTASONE  - Prednisone dosing: Take  Prednisone 40mg  (4 tabs) x 3 days, then taper to 30mg  (3 tabs) x 3 days, then 20mg  (2 tabs) x 3days, then 10mg  (1 tab) x 3days, then OFF.  -   - Dispense:  30 tabs, refills: None  Start taking on:  03/06/2014     Vitamin D-3 5000 UNITS Tabs  Take 1 tablet by mouth daily.         Brief H and P: For complete details please refer to admission H and P, but in brief [name] is a 63 y.o. female  With a 3 days history of cough- with yellow sputum. She has  a history of lung cancer, is on home O2 and continues to smoke.  She was given albuterol/atrovent by EMS with minimal improvement.  Patient also acknowledged a 20 minute transient blindness of her right eye. Described as a shade coming down, resolved In the Er, patient was given continuous nebulizer, a ct head was done that did not show a CVA. Chest x ray showed COPD with prior CABG  Hospital Course:   Patient is a 63 year old female with history of CAD/CABG, hypertension, GERD, hyperlipidemia, COPD, chronic respiratory failure, nicotine abuse presented with shortness of breath, coughing and wheezing. Patient also reported 20 minute transient blindness in her right eye described as "shade coming down" which has resolved. Patient was admitted for COPD exacerbation and TIA workup.    COPD exacerbation: Improving  - Placed on scheduled nebs, Transitioned to oral prednisone and oral Levaquin, continue O2    Transient blindness of right eye? TIA: Symptoms resolved  - MRI of the brain negative for CVA, MRA showed no flow in the right vertebral artery, left vertebral artery patent  - Carotid Dopplers showed right side 40-59%, left 60-79% ICA stenosis, vascular surgery consulted discussed with Dr. Arita Miss, recommended CT angiogram of the neck  - CT angiogram showed 60% stenosis of the right and 64% of the left internal carotid artery, patient did not want to stay any longer for full evaluation by Dr Trula Slade. Dr Trula Slade recommended follow-up with her cardiologist, Dr Gwenlyn Found in 10days.   - Patient also  reported that she had a 2-D echo done in Dr. Gwenlyn Found, i was unable to find the echo in [facility], I called their office and still no echo available. Patient had a cath in August last year and had EF of 45%. Patient is adamant that she had an echocardiogram done and refused in-patient.  - Currently on aspirin, statin and effient   Hyperlipidemia  - Lipid panel showed cholesterol 233, LDL 138  -Continue Lipitor 80  mg, Zetia   Anxiety state  - Patient is very anxious and still grieving, husband recently died in 11/11/2022 this year from stroke on the same floor.   - Currently on Valium for back pain, gave her one dose of Ativan.    GERD  - Continue PPI   CAD (coronary artery disease), autologous vein bypass graft, cath 01/12/13 with  Xience DES to VG-RCA  - Continue aspirin, statin, effient   Severe protein calorie malnutrition  Added nutritional supplements, also nutrition consulted      Day of Discharge BP 116/42  Pulse 54  Temp(Src) 98 F (36.7 C) (Oral)  Resp 18  Ht 5\' 6"  (1.676 m)  Wt 47.945 kg (105 lb 11.2 oz)  BMI 17.07 kg/m2  SpO2 96%  Physical Exam: General: Alert and awake oriented x3 not in any acute distress. CVS: S1-S2 clear no murmur rubs or gallops Chest: clear to auscultation bilaterally, no wheezing rales or rhonchi Abdomen: soft nontender, nondistended, normal bowel sounds Extremities: no cyanosis, clubbing or edema noted bilaterally Neuro: Cranial nerves II-XII intact, no focal neurological deficits   The results of significant diagnostics from this hospitalization (including imaging, microbiology, ancillary and laboratory) are listed below for reference.    LAB RESULTS: Basic Metabolic Panel:  Recent Labs Lab 03/03/14 1147 03/04/14 0700  NA 138 141  K 3.5* 4.1  CL 97 101  CO2 28 27  GLUCOSE 93 123*  BUN 6 9  CREATININE 0.51 0.51  CALCIUM 9.7 9.1   Liver Function Tests:  Recent Labs Lab 03/03/14 1147  AST 21  ALT 16  ALKPHOS 117  BILITOT 0.4  PROT 8.2  ALBUMIN 4.6   No results found for this basename: LIPASE, AMYLASE,  in the last 168 hours No results found for this basename: AMMONIA,  in the last 168 hours CBC:  Recent Labs Lab 03/03/14 1147 03/04/14 0700  WBC 7.0 3.9*  NEUTROABS 6.1  --   HGB 14.2 12.2  HCT 41.0 36.6  MCV 94.7 96.3  PLT 214 188   Cardiac Enzymes:  Recent Labs Lab 03/03/14 1147  TROPONINI <0.30   BNP: No  components found with this basename: POCBNP,  CBG:  Recent Labs Lab 03/05/14 0648 03/05/14 1132  GLUCAP 142* 182*    Significant Diagnostic Studies:  Dg Chest 2 View  03/03/2014   CLINICAL DATA:  Mid sternal chest pain, shortness of breath. History of CHF, COPD.  EXAM: CHEST  2 VIEW  COMPARISON:  Chest CT 10/05/2013.  Chest x-ray 09/11/2013.  FINDINGS: Prior CABG. Heart is normal size. There is hyperinflation of the lungs compatible with COPD. No focal airspace opacities or effusions.  Postoperative changes in the lower thoracic and visualized upper lumbar spine.  IMPRESSION: COPD.  Prior CABG.  No active disease.   Electronically Signed   By: Rolm Baptise M.D.   On: 03/03/2014 13:25   Ct Head Wo Contrast  03/03/2014   CLINICAL DATA:  Acute onset severe headache ; history of lung carcinoma  EXAM: CT HEAD WITHOUT CONTRAST  TECHNIQUE: Contiguous axial images were obtained from the base of the skull through the vertex without intravenous contrast.  COMPARISON:  May 23, 2013  FINDINGS: The ventricles are normal in size and configuration. The left lateral ventricle is slightly larger than the right lateral ventricle, a stable finding.  There is no mass, hemorrhage, extra-axial fluid collection, or midline shift. Gray-white compartments appear normal. There is no demonstrable acute infarct. Bony calvarium appears intact. There is opacification of several inferior mastoid air cells bilaterally, stable. The remaining mastoid air cells are clear. No air-fluid levels in the mastoid regions.  IMPRESSION: No intracranial mass, hemorrhage, or focal gray -white compartment lesions/acute infarct. Mild mastoid air cell thickening inferiorly on both sides, stable. Other mastoids are clear.   Electronically Signed   By: Lowella Grip M.D.   On: 03/03/2014  15:59   Mri Brain Without Contrast  03/04/2014   CLINICAL DATA:  History of vascular disease. 20 min episode of transient blindness of the right high  consistent with TIA.  EXAM: MRI HEAD WITHOUT CONTRAST  MRA HEAD WITHOUT CONTRAST  TECHNIQUE: Multiplanar, multiecho pulse sequences of the brain and surrounding structures were obtained without intravenous contrast. Angiographic images of the head were obtained using MRA technique without contrast.  COMPARISON:  Head CT 03/03/2014.  MRI 02/08/2012.  FINDINGS: MRI HEAD FINDINGS  Diffusion imaging does not show any acute or subacute infarction. The brainstem and cerebellum are unremarkable. The cerebral hemispheres do not show any evidence of old small or large vessel insult. No mass lesion, hemorrhage, hydrocephalus or extra-axial collection. No pituitary mass. No inflammatory sinus disease. There is a small amount of fluid in the mastoid air cells bilaterally. No skull or skullbase lesion.  MRA HEAD FINDINGS  Both internal carotid arteries are widely patent into the brain. The anterior and middle cerebral vessels are patent without proximal stenosis, aneurysm or vascular malformation.  The left vertebral artery is dominant and widely patent to the basilar. No flow is present in the right vertebral artery. Left PICA is patent. Both anterior inferior cerebellar arteries are patent. Superior cerebellar and posterior cerebral arteries are patent bilaterally. There is a large posterior communicating artery on the right.  IMPRESSION: Normal appearance of the brain itself. No evidence of old or acute ischemic insult.  No demonstrable flow in the right vertebral artery. Left vertebral artery is a large vessel widely patent to the basilar. No other intracranial vascular abnormality seen.   Electronically Signed   By: Nelson Chimes M.D.   On: 03/04/2014 14:29   Mr Jodene Nam Head/brain Wo Cm  03/04/2014   CLINICAL DATA:  History of vascular disease. 20 min episode of transient blindness of the right high consistent with TIA.  EXAM: MRI HEAD WITHOUT CONTRAST  MRA HEAD WITHOUT CONTRAST  TECHNIQUE: Multiplanar, multiecho pulse  sequences of the brain and surrounding structures were obtained without intravenous contrast. Angiographic images of the head were obtained using MRA technique without contrast.  COMPARISON:  Head CT 03/03/2014.  MRI 02/08/2012.  FINDINGS: MRI HEAD FINDINGS  Diffusion imaging does not show any acute or subacute infarction. The brainstem and cerebellum are unremarkable. The cerebral hemispheres do not show any evidence of old small or large vessel insult. No mass lesion, hemorrhage, hydrocephalus or extra-axial collection. No pituitary mass. No inflammatory sinus disease. There is a small amount of fluid in the mastoid air cells bilaterally. No skull or skullbase lesion.  MRA HEAD FINDINGS  Both internal carotid arteries are widely patent into the brain. The anterior and middle cerebral vessels are patent without proximal stenosis, aneurysm or vascular malformation.  The left vertebral artery is dominant and widely patent to the basilar. No flow is present in the right vertebral artery. Left PICA is patent. Both anterior inferior cerebellar arteries are patent. Superior cerebellar and posterior cerebral arteries are patent bilaterally. There is a large posterior communicating artery on the right.  IMPRESSION: Normal appearance of the brain itself. No evidence of old or acute ischemic insult.  No demonstrable flow in the right vertebral artery. Left vertebral artery is a large vessel widely patent to the basilar. No other intracranial vascular abnormality seen.   Electronically Signed   By: Nelson Chimes M.D.   On: 03/04/2014 14:29       Disposition and Follow-up:    DISPOSITION: home  DIET:heart  healthy    DISCHARGE FOLLOW-UP Follow-up Information   Follow up with Tivis Ringer, MD. Schedule an appointment as soon as possible for a visit in 10 days.   Specialty:  Internal Medicine   Contact information:   [address] [place] [address] [phone]       Follow up with  Lorretta Harp, MD. Schedule an appointment as soon as possible for a visit in 10 days. (for hospital follow-up)    Specialty:  Cardiology   Contact information:   [address] [place] [address] [phone]       Time spent on Discharge: 40 mins  Signed:   RAI,RIPUDEEP M.D. [facility] 03/05/2014, 2:32 PM Pager: [phone]

## 2014-03-05 NOTE — Progress Notes (Addendum)
INITIAL NUTRITION ASSESSMENT  DOCUMENTATION CODES Per approved criteria  -Severe malnutrition in the context of chronic illness -Underweight  Pt meets criteria for SEVERE MALNUTRITION in the context of CHRONIC ILLNESS as evidenced by severe loss of subcutaneous fat and severe loss of muscle mass.  INTERVENTION: Provide Ensure Complete QID Encourage PO intake  NUTRITION DIAGNOSIS: Inadequate oral intake related to poor appetite as evidenced by underweight BMI.   Goal: Pt to meet >/= 90% of their estimated nutrition needs   Monitor:  PO intake, weight trend, labs, I/O's  Reason for Assessment: Consult for Poor PO intake and Positive Malnutrition Screening Tool (MST)  63 y.o. female  Admitting Dx: COPD exacerbation  ASSESSMENT: 63 year old female with history of CAD/CABG, hypertension, GERD, hyperlipidemia, COPD, chronic respiratory failure, nicotine abuse presented with shortness of breath, coughing and wheezing. Patient also reported 20 minute transient blindness in her right eye described as "shade coming down" which has resolved. Patient was admitted for COPD exacerbation and TIA workup.  Pt states that prior to back surgery 3 years ago she weighed 130 lbs; shortly after she lost down to 95 lbs due to lack of appetite. Pt states that she has continued to eat very little food over the past few years and instead drinks Ensure Complete 5 times daily. She denies any nausea, vomiting, pain, or indigestion contributing to poor appetite. Pt denies any recent weight loss.   Ensure complete 5 times daily provides 1750 kcal and 65 grams of protein. Encouraged pt to snack frequently throughout the day in addition to drinking Ensure. Discussed ways to add calories and protein to food- pt states she has heard this all before.   Labs reviewed.   Nutrition Focused Physical Exam:  Subcutaneous Fat:  Orbital Region: mild wasting Upper Arm Region: severe wasting Thoracic and Lumbar Region:  NA  Muscle:  Temple Region: moderate wasting Clavicle Bone Region: severe wasting Clavicle and Acromion Bone Region: severe wasting Scapular Bone Region: severe wasting Dorsal Hand: moderate wasting Patellar Region: moderate wasting Anterior Thigh Region: severe wasting Posterior Calf Region: moderate wasting  Edema: none noted   Height: Ht Readings from Last 1 Encounters:  03/03/14 5\' 6"  (1.676 m)    Weight: Wt Readings from Last 1 Encounters:  03/03/14 105 lb 11.2 oz (47.945 kg)    Ideal Body Weight: 130 lbs  % Ideal Body Weight: 81%  Wt Readings from Last 10 Encounters:  03/03/14 105 lb 11.2 oz (47.945 kg)  01/23/14 96 lb 14.4 oz (43.954 kg)  01/01/14 94 lb 12.8 oz (43.001 kg)  12/31/13 95 lb 9.6 oz (43.364 kg)  10/17/13 94 lb 3.2 oz (42.729 kg)  08/07/13 94 lb (42.638 kg)  07/22/13 92 lb 6.4 oz (41.912 kg)  06/07/13 98 lb 3.2 oz (44.543 kg)  06/04/13 98 lb (44.453 kg)  04/06/13 95 lb (43.092 kg)    Usual Body Weight: 130 lbs (3 years ago)  % Usual Body Weight: 81%  BMI:  Body mass index is 17.07 kg/(m^2). (Underweight)  Estimated Nutritional Needs: Kcal: 1600-1800 Protein: 70-80 grams Fluid: 1.6 L/day  Skin: intact  Diet Order: Cardiac  EDUCATION NEEDS: -No education needs identified at this time   Intake/Output Summary (Last 24 hours) at 03/05/14 0922 Last data filed at 03/05/14 0829  Gross per 24 hour  Intake    200 ml  Output      0 ml  Net    200 ml    Last BM: 10/12   Labs:   Recent  Labs Lab 03/03/14 1147 03/04/14 0700  NA 138 141  K 3.5* 4.1  CL 97 101  CO2 28 27  BUN 6 9  CREATININE 0.51 0.51  CALCIUM 9.7 9.1  GLUCOSE 93 123*    CBG (last 3)   Recent Labs  03/04/14 1620 03/04/14 2130 03/05/14 0648  GLUCAP 125* 131* 142*    Scheduled Meds: . aspirin EC  81 mg Oral Daily  . atorvastatin  80 mg Oral QHS  . benzonatate  100 mg Oral TID  . diazepam  5 mg Oral BID  . enoxaparin (LOVENOX) injection  40 mg  Subcutaneous Q24H  . ezetimibe  10 mg Oral Daily  . famotidine  20 mg Oral QHS  . feeding supplement (ENSURE COMPLETE)  237 mL Oral TID BM  . fluticasone  1 spray Each Nare Daily  . Influenza vac split quadrivalent PF  0.5 mL Intramuscular Tomorrow-1000  . levalbuterol  0.63 mg Nebulization Q6H  . levofloxacin  750 mg Oral QPM  . loratadine  10 mg Oral Daily  . LORazepam      . montelukast  10 mg Oral QHS  . pantoprazole  40 mg Oral Daily  . prasugrel  5 mg Oral Daily  . [START ON 03/06/2014] predniSONE  40 mg Oral Q breakfast  . Umeclidinium-Vilanterol  1 puff Inhalation Daily    Continuous Infusions:   Past Medical History  Diagnosis Date  . Acute myocardial infarction, unspecified site, episode of care unspecified 2011    VF arrest. Ruled in, cath with no clear culprit lesion, inferior WMA on LV gram, medical therapy  . Esophageal dysmotility   . Hemorrhoids   . Esophageal stricture   . Diaphragmatic hernia without mention of obstruction or gangrene   . 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 CAD (coronary artery disease)     CABG'96., Xience DES -VG-RCA (01/12/13)  . Peripheral neuropathy   . Hyperlipidemia   . Peptic ulcer, unspecified site, unspecified as acute or chronic, without mention of hemorrhage, perforation, or obstruction   . Depressive disorder, not elsewhere classified   . Anxiety state, unspecified   . COPD (chronic obstructive pulmonary disease)   . GERD (gastroesophageal reflux disease)   . Candida esophagitis     on multiple occasions  . C. difficile diarrhea   . PVD (peripheral vascular disease)   . Headache(784.0)   . Weight loss 06/13/2011    scheduled here by family doctor  . CHF (congestive heart failure) 2011  . Asthma   . Hypertension   . History of nuclear stress test 07/14/2010    dipyriadmole; normal pattern of perfusion, low risk   . History of tobacco use     quit 05/2009  . Non-small cell carcinoma of lung, stage 3     Dr.  Roxan Hockey  . History of radiation therapy 02/28/2012-04/14/2012    right lung    Past Surgical History  Procedure Laterality Date  . Tonsillectomy  1974  . Fracture lt jaw  1976  . Breast surgery  1969, 1970    bilateral  . Epigastric hernia repair  2008    Dr Roxan Hockey  . Coronary artery bypass graft  02/01/1994    left internal mammary to LAD, vein graft to diagonal/post descending, post lateral, marginals, circumfle, diagonal  . Carpal tunnel release    . Total abdominal hysterectomy  1998  . Laminectomy    . Subclavian vein angioplasty / stenting Left   . Transthoracic  echocardiogram  08/14/2009    EF=>55%, normal LV systolic function, mild hypokinesis of basal segments of inferolateral & anterolater walls; mild MR/TR; AV mildly sclerotic  . Coronary angioplasty with stent placement  08/29/2000    stent to RCA (Dr. Corky Downs)  . Cardiac catheterization  01/17/2002    patent grafts (Dr. Marella Chimes)  . Cardiac catheterization  02/13/2004    significant L main & 3 vessel CAD, patents grafts, mild distal aortic disease (Dr. Gerrie Nordmann)  . Cardiac catheterization  07/14/2005    patent grafts, normal LV function (Dr. Adora Fridge)  . Cardiac catheterization  09/26/2008    patent grafts, normal LV function (Dr. Adora Fridge)  . Cardiac catheterization  05/31/2009    patent grafts, inferior wall motion abnormality (Dr. Adora Fridge)  . Cardiac catheterization  10/03/2009    normal LV function; 100% native RCA, circumflex, distal LAD occlusion  . Coronary angioplasty with stent placement  01/12/2013    Xience DES to SVG-RCA distal  . Carotid doppler  08/14/2009    R & L ICAs - 0-49% diameter reduction     Pryor Ochoa RD, LDN Inpatient Clinical Dietitian Pager: [phone] After Hours Pager: [phone]

## 2014-03-05 NOTE — Progress Notes (Addendum)
Patient ID: [name]  female  WUJ:[id number]    DOB: [birth year]    DOA: 03/03/2014  PCP: Tivis Ringer, MD  History of present illness Patient is a 63 year old female with history of CAD/CABG, hypertension, GERD, hyperlipidemia, COPD, chronic respiratory failure, nicotine abuse presented with shortness of breath, coughing and wheezing. Patient also reported 20 minute transient blindness in her right eye described as "shade coming down" which has resolved. Patient was admitted for COPD exacerbation and TIA workup.  Assessment/Plan: Principal Problem:   COPD exacerbation: Improving - Placed on scheduled nebs,  Transitioned to oral prednisone and oral Levaquin today - Continue O2 via nasal cannula  Active Problems:   Transient blindness of right eye? TIA: Symptoms resolved - MRI of the brain negative for CVA, MRA showed no flow in the right vertebral artery, left vertebral artery patent   - Carotid Dopplers showed right side 40-59%, left 60-79%  ICA stenosis, vascular surgery consulted discussed with Dr. Arita Miss, recommended CT angiogram of the neck  - CT and GU showed 60% stenosis of the right and 64% of the left internal carotid artery, awaiting surgery recommendations  - Patient reports that she had a 2-D echo done in Dr. Gwenlyn Found, unable to find the echo in [facility], I called their office and still no echo available. Patient had a cath in August last year and had EF of 45%. Patient is adamant that she had an echocardiogram done.  - Currently on aspirin, statin and effient    Hyperlipidemia - Lipid panel showed cholesterol 233, LDL 138 -Continue Lipitor 80 mg, Zetia     Anxiety state - Patient is  very anxious, husband recently died in 2022-11-04 this year from stroke - Currently on Valium for back pain, gave her one dose of Ativan    GERD - Continue PPI    CAD (coronary artery disease), autologous vein bypass graft, cath 01/12/13 with Xience DES to VG-RCA - Continue aspirin, statin,  effient  Severe protein calorie malnutrition - Will add nutritional supplements, also nutrition consulted  DVT Prophylaxis: on effient  Code Status:Full code   Family Communication:  Disposition:  vascular surgery recommendations pending   Consultants:  None   Procedures:  None   Antibiotics:  Levaquin    Subjective: Patient seen and examined, very anxious, wants to go home ASAP to take care of her grandson and the dog  Objective: Weight change:   Intake/Output Summary (Last 24 hours) at 03/05/14 1101 Last data filed at 03/05/14 0829  Gross per 24 hour  Intake    200 ml  Output      0 ml  Net    200 ml   Blood pressure 116/42, pulse 54, temperature 98 F (36.7 C), temperature source Oral, resp. rate 18, height 5\' 6"  (1.676 m), weight 47.945 kg (105 lb 11.2 oz), SpO2 96.00%.  Physical Exam: General: Alert and awake, oriented x3, not in any acute distress. CVS: S1-S2 clear, no murmur rubs or gallops Chest:  bilateral expiratory wheezing improved significantly from yesterday Abdomen: soft nontender, nondistended, normal bowel sounds  Extremities: no cyanosis, clubbing or edema noted bilaterally Neuro: Cranial nerves II-XII intact, no focal neurological deficits  Lab Results: Basic Metabolic Panel:  Recent Labs Lab 03/03/14 1147 03/04/14 0700  NA 138 141  K 3.5* 4.1  CL 97 101  CO2 28 27  GLUCOSE 93 123*  BUN 6 9  CREATININE 0.51 0.51  CALCIUM 9.7 9.1   Liver Function Tests:  Recent Labs  Lab 03/03/14 1147  AST 21  ALT 16  ALKPHOS 117  BILITOT 0.4  PROT 8.2  ALBUMIN 4.6   No results found for this basename: LIPASE, AMYLASE,  in the last 168 hours No results found for this basename: AMMONIA,  in the last 168 hours CBC:  Recent Labs Lab 03/03/14 1147 03/04/14 0700  WBC 7.0 3.9*  NEUTROABS 6.1  --   HGB 14.2 12.2  HCT 41.0 36.6  MCV 94.7 96.3  PLT 214 188   Cardiac Enzymes:  Recent Labs Lab 03/03/14 1147  TROPONINI <0.30    BNP: No components found with this basename: POCBNP,  CBG:  Recent Labs Lab 03/04/14 0647 03/04/14 1148 03/04/14 1620 03/04/14 2130 03/05/14 0648  GLUCAP 194* 144* 125* 131* 142*     Micro Results: No results found for this or any previous visit (from the past 240 hour(s)).  Studies/Results: Dg Chest 2 View  03/03/2014   CLINICAL DATA:  Mid sternal chest pain, shortness of breath. History of CHF, COPD.  EXAM: CHEST  2 VIEW  COMPARISON:  Chest CT 10/05/2013.  Chest x-ray 09/11/2013.  FINDINGS: Prior CABG. Heart is normal size. There is hyperinflation of the lungs compatible with COPD. No focal airspace opacities or effusions.  Postoperative changes in the lower thoracic and visualized upper lumbar spine.  IMPRESSION: COPD.  Prior CABG.  No active disease.   Electronically Signed   By: Rolm Baptise M.D.   On: 03/03/2014 13:25   Ct Head Wo Contrast  03/03/2014   CLINICAL DATA:  Acute onset severe headache ; history of lung carcinoma  EXAM: CT HEAD WITHOUT CONTRAST  TECHNIQUE: Contiguous axial images were obtained from the base of the skull through the vertex without intravenous contrast.  COMPARISON:  May 23, 2013  FINDINGS: The ventricles are normal in size and configuration. The left lateral ventricle is slightly larger than the right lateral ventricle, a stable finding.  There is no mass, hemorrhage, extra-axial fluid collection, or midline shift. Gray-white compartments appear normal. There is no demonstrable acute infarct. Bony calvarium appears intact. There is opacification of several inferior mastoid air cells bilaterally, stable. The remaining mastoid air cells are clear. No air-fluid levels in the mastoid regions.  IMPRESSION: No intracranial mass, hemorrhage, or focal gray -white compartment lesions/acute infarct. Mild mastoid air cell thickening inferiorly on both sides, stable. Other mastoids are clear.   Electronically Signed   By: Lowella Grip M.D.   On: 03/03/2014  15:59    Medications: Scheduled Meds: . aspirin EC  81 mg Oral Daily  . atorvastatin  80 mg Oral QHS  . benzonatate  100 mg Oral TID  . diazepam  5 mg Oral BID  . enoxaparin (LOVENOX) injection  40 mg Subcutaneous Q24H  . ezetimibe  10 mg Oral Daily  . famotidine  20 mg Oral QHS  . feeding supplement (ENSURE COMPLETE)  237 mL Oral TID BM  . fluticasone  1 spray Each Nare Daily  . Influenza vac split quadrivalent PF  0.5 mL Intramuscular Tomorrow-1000  . levalbuterol  0.63 mg Nebulization Q6H  . levofloxacin  750 mg Oral QPM  . loratadine  10 mg Oral Daily  . LORazepam      . montelukast  10 mg Oral QHS  . pantoprazole  40 mg Oral Daily  . prasugrel  5 mg Oral Daily  . [START ON 03/06/2014] predniSONE  40 mg Oral Q breakfast  . Umeclidinium-Vilanterol  1 puff Inhalation Daily  LOS: 2 days   Camber Ninh M.D. [facility] 03/05/2014, 11:01 AM Pager: [phone]  If 7PM-7AM, please contact night-coverage [email or web address] Password [email or web address]

## 2014-03-05 NOTE — Discharge Instructions (Addendum)
Follow up with Dr. Gwenlyn Found at [facility] within 10 days.

## 2014-03-11 ENCOUNTER — Telehealth: Payer: Self-pay | Admitting: Pulmonary Disease

## 2014-03-11 MED ORDER — FIRST-DUKES MOUTHWASH MT SUSP: OROMUCOSAL | Status: DC

## 2014-03-11 MED ORDER — FLUCONAZOLE 100 MG PO TABS: 100.0000 mg | ORAL_TABLET | Freq: Every day | ORAL | Status: DC

## 2014-03-11 NOTE — Telephone Encounter (Signed)
Pt returned call & can be reached at [phone].  [name]

## 2014-03-11 NOTE — Telephone Encounter (Signed)
Diflucan 100 mg daily x 3 days no refill   Magic mouthwash 1-2 tsp qid prn # 8 oz no refill

## 2014-03-11 NOTE — Telephone Encounter (Signed)
lmomtcb x1 

## 2014-03-11 NOTE — Telephone Encounter (Signed)
Called spoke with pt. Aware of recs. RX sent in. Nothing further needed

## 2014-03-11 NOTE — Telephone Encounter (Signed)
Spoke with pt, states that she has thrush in mouth and around lips X4 days, is requesting a mouthwash and a pill because she states that the mouthwash alone doesn't get rid of the thrush for her.    Sending to Doc of the day for recs.  Please advise.  Thank you.

## 2014-03-12 ENCOUNTER — Telehealth: Payer: Self-pay | Admitting: Pulmonary Disease

## 2014-03-12 ENCOUNTER — Other Ambulatory Visit: Payer: Self-pay | Admitting: Internal Medicine

## 2014-03-12 NOTE — Telephone Encounter (Signed)
Spoke with patient- states that mouthwash called in is not covered by insurance. Pharmacy received Rx for FIRST-DUKES MOUTHWASH Tanda Rockers, MD at 03/11/2014 12:29 PM     Status: Signed        Diflucan 100 mg daily x 3 days no refill  Magic mouthwash 1-2 tsp qid prn # 8 oz no refill    Pt states that she has used Nystatin mouthwash in the past and had no problem with coverage. Can this be called in? Please advise Dr Melvyn Novas. Thanks.  Allergies  Allergen Reactions  . Amiodarone Other (See Comments)    Drops HR too low, SOB, fatigue  . Clopidogrel Bisulfate Hives  . Dilaudid [Hydromorphone Hcl] Other (See Comments)    "I couldn't function for 2 days."  . Multaq [Dronedarone] Other (See Comments)    Heart races

## 2014-03-12 NOTE — Telephone Encounter (Signed)
Pt states that she is in a lot of pain, mouth is hurting and yeast infection is in her eyes. Pt wants to know if anything else can be done? Pt c/o a lot of wheezing. Please advise Dr Melvyn Novas. Thanks.

## 2014-03-12 NOTE — Telephone Encounter (Signed)
No need to do any mouthwash then because the other med that would help is nystatin and that's redundant on systemic diflucan

## 2014-03-12 NOTE — Telephone Encounter (Signed)
LMTCB

## 2014-03-12 NOTE — Telephone Encounter (Signed)
Nothing else to offer over the phone - she needs to be seen in office asap with all meds in hand by Tammy or first available and if condition worsens in meantime go to ER  Also Dr Dagmar Hait is primary for all non pulmonary issues like her occular complaints

## 2014-03-12 NOTE — Telephone Encounter (Signed)
Spoke with pt. Pt refused to schedule appt with TP. States that she has an appt with her PCP on Thursday and Cardiology on Friday.  Pt was not happy with rec's per MW, pt states that this infection is coming from her inhalers. Pt advised again that she can either schedule an appt with Tammy or can see her PCP as there is nothing further that can be offered over the phone. Pt stated that all she would be doing is making "another doctors appointment on top of what she already has scheduled" Pt hung up the phone. Nothing further needed.

## 2014-03-15 ENCOUNTER — Encounter: Payer: Self-pay | Admitting: Cardiovascular Disease

## 2014-03-15 ENCOUNTER — Ambulatory Visit (INDEPENDENT_AMBULATORY_CARE_PROVIDER_SITE_OTHER): Payer: Medicaid Other | Admitting: Cardiovascular Disease

## 2014-03-15 VITALS — BP 129/66 | HR 105 | Ht 67.0 in | Wt 97.5 lb

## 2014-03-15 DIAGNOSIS — I739 Peripheral vascular disease, unspecified: Secondary | ICD-10-CM

## 2014-03-15 DIAGNOSIS — I779 Disorder of arteries and arterioles, unspecified: Secondary | ICD-10-CM

## 2014-03-15 DIAGNOSIS — R6251 Failure to thrive (child): Secondary | ICD-10-CM

## 2014-03-15 DIAGNOSIS — R627 Adult failure to thrive: Secondary | ICD-10-CM

## 2014-03-15 DIAGNOSIS — H53121 Transient visual loss, right eye: Secondary | ICD-10-CM

## 2014-03-15 DIAGNOSIS — I10 Essential (primary) hypertension: Secondary | ICD-10-CM

## 2014-03-15 DIAGNOSIS — E785 Hyperlipidemia, unspecified: Secondary | ICD-10-CM

## 2014-03-15 NOTE — Patient Instructions (Signed)
Your physician wants you to follow-up in: 6 months with Dr Gwenlyn Found. You will receive a reminder letter in the mail two months in advance. If you don't receive a letter, please call our office to schedule the follow-up appointment.  We have referred you to [place] for evaluation of home health.

## 2014-03-15 NOTE — Assessment & Plan Note (Signed)
History of severe oxygen-dependent COPD

## 2014-03-15 NOTE — Assessment & Plan Note (Signed)
[name] had an episode of amaurosis fugax in the right eye. CT angiography revealed 60% bilateral ICA stenosis though MRI of the brain did not show any acute stroke. She was seen by Dr. Trula Slade , suggested endarterectomy.she is on dual antiplatelet therapy because of her DES. I believe she would be extremely high risk for surgical revascularization. She may be a candidate for carotid artery stenting but this time with all her comorbidities I think conservative care it would be most appropriate

## 2014-03-15 NOTE — Assessment & Plan Note (Signed)
On atorvastatin and Zetia with her most recent lipid profile performed 03/04/14 revealed a total cholesterol of 233, LDL of 138 and HDL of 84

## 2014-03-15 NOTE — Assessment & Plan Note (Signed)
Controlled on current medications 

## 2014-03-15 NOTE — Progress Notes (Signed)
03/15/2014 [name]   27-Dec-1950  [id number]  Primary Physician Tivis Ringer, MD Primary Cardiologist: Lorretta Harp MD [name]   HPI:  The patient is an unfortunate 63 year old, thin and frail-appearing, [name] whose husband [name] is also a patient of mine. She is a mother of 2, grandmother to 2 grandchildren. I last saw her 6 months ago and have been taking care of her since the early 1990s. She has a history of CAD status post coronary artery bypass grafting in 1995, as well as left subclavian artery PCI and stenting by myself for subclavian steal. Her other problems include discontinued tobacco abuse January 2011, hypertension, dyslipidemia and GERD. Her last cath was performed Oct 03, 2009, revealing patent grafts. She has recently been diagnosed with non-small cell stage IIIA lung cancer diagnosed by biopsy performed by Dr. Roxan Hockey and has had chemo and radiation therapy. [name] follows her as an outpatient. She denies chest pain but has chronic shortness of breath. Her last Myoview performed 07/14/2010 was nonischemic. She was admitted to hospital in August with unstable angina and underwent cardiac catheterization by Dr. Glenetta Hew revealing patent grafts with a high-grade stenosis in her RCA SVG which was stented with a drug-eluting stent. She was hospitalized several weeks ago for acute on chronic respiratory failure thought to be multifactorial. She has advanced COPD, stage IIIB squamous cell lung cancer and moderate LV dysfunction. She is currently on 4 L of oxygen. She appears to be protein calorie malnourished and is losing weight. She was recently admitted to the hospital earlier this month with exacerbation of her COPD and was a TIA characterized by amaurosis fugax in the right eye lasting 5 minutes. Angiography revealed moderate bilateral internal carotid artery stenosis an MRI did not show an acute stroke. She was  apparently seen by vascular surgery who suggested carotid revascularization.we have discussed end-of-life issues today and decided to make her a DO NOT RESUSCITATE. I think with all of her comorbidities and debility she would benefit from home health care.     Current Outpatient Prescriptions  Medication Sig Dispense Refill  . albuterol (PROVENTIL HFA;VENTOLIN HFA) 108 (90 BASE) MCG/ACT inhaler Inhale 2 puffs into the lungs every 6 (six) hours as needed for wheezing or shortness of breath.      Marland Kitchen aspirin 81 MG EC tablet Take 81 mg by mouth daily.        Marland Kitchen atorvastatin (LIPITOR) 80 MG tablet Take 80 mg by mouth at bedtime.      . benzonatate (TESSALON) 100 MG capsule Take 1 capsule (100 mg total) by mouth 3 (three) times daily as needed for cough.  60 capsule  0  . Cholecalciferol (VITAMIN D-3) 5000 UNITS TABS Take 1 tablet by mouth daily.      . diazepam (VALIUM) 5 MG tablet Take 5 mg by mouth 2 (two) times daily.      Marland Kitchen ezetimibe (ZETIA) 10 MG tablet Take 10 mg by mouth daily.        . famotidine (PEPCID) 20 MG tablet Take 20 mg by mouth at bedtime.      . fluconazole (DIFLUCAN) 100 MG tablet Take 1 tablet (100 mg total) by mouth daily.  3 tablet  0  . fluticasone (FLONASE) 50 MCG/ACT nasal spray Place 1 spray into both nostrils daily as needed for allergies or rhinitis.  16 g  2  . HYDROcodone-acetaminophen (NORCO) 10-325 MG per tablet Take 1 tablet by mouth  every 6 (six) hours as needed for moderate pain.       Marland Kitchen loratadine (CLARITIN) 10 MG tablet Take 1 tablet (10 mg total) by mouth daily as needed for allergies.  30 tablet  3  . montelukast (SINGULAIR) 10 MG tablet Take 10 mg by mouth at bedtime.      . nitroGLYCERIN (NITROSTAT) 0.4 MG SL tablet Place 0.4 mg under the tongue every 5 (five) minutes as needed. For chest pain      . omeprazole (PRILOSEC) 20 MG capsule Take 20 mg by mouth 2 (two) times daily before a meal.      . OXYGEN-HELIUM IN Inhale 4 L into the lungs continuous.      .  prasugrel (EFFIENT) 10 MG TABS tablet Take 10 mg by mouth daily.      Marland Kitchen Umeclidinium-Vilanterol (ANORO ELLIPTA) 62.5-25 MCG/INH AEPB Inhale 1 puff into the lungs daily.       No current facility-administered medications for this visit.    Allergies  Allergen Reactions  . Amiodarone Other (See Comments)    Drops HR too low, SOB, fatigue  . Clopidogrel Bisulfate Hives  . Dilaudid [Hydromorphone Hcl] Other (See Comments)    "I couldn't function for 2 days."  . Multaq [Dronedarone] Other (See Comments)    Heart races     History   Social History  . Marital Status: Married    Spouse Name: N/A    Number of Children: 2  . Years of Education: N/A   Occupational History  . disabled     back problems   Social History Main Topics  . Smoking status: Former Smoker -- 1.50 packs/day for 30 years    Types: Cigarettes    Quit date: 05/24/2013  . Smokeless tobacco: Never Used  . Alcohol Use: No  . Drug Use: Yes    Special: Flunitrazepam  . Sexual Activity: Not on file   Other Topics Concern  . Not on file   Social History Narrative   Married with 2 children, husband has Alzheimer's dementia, retired now, previously Corporate treasurer, Quit smoking in [place]: General: negative for chills, fever, night sweats or weight changes.  Cardiovascular: negative for chest pain, dyspnea on exertion, edema, orthopnea, palpitations, paroxysmal nocturnal dyspnea or shortness of breath Dermatological: negative for rash Respiratory: negative for cough or wheezing Urologic: negative for hematuria Abdominal: negative for nausea, vomiting, diarrhea, bright red blood per rectum, melena, or hematemesis Neurologic: negative for visual changes, syncope, or dizziness All other systems reviewed and are otherwise negative except as noted above.    Blood pressure 129/66, pulse 105, height 5\' 7"  (1.702 m), weight 97 lb 8 oz (44.226 kg).  General appearance: alert and mild  distress Neck: no adenopathy, no JVD, supple, symmetrical, trachea midline, thyroid not enlarged, symmetric, no tenderness/mass/nodules and soft right carotid bruit Lungs: diminished breath sounds bilaterally Heart: regular rate and rhythm, S1, S2 normal, no murmur, click, rub or gallop Extremities: extremities normal, atraumatic, no cyanosis or edema  EKG not performed to  ASSESSMENT AND PLAN:   CAD- CABG '96. SVG-RCA DES Aug 2014 History of CAD status post coronary bypass grafting in 1995 as well as left subclavian artery stenting. She has moderate LV dysfunction. She has had multiple interventions including most recently an DES placed to her RCA SVG by Dr. Ellyn Hack in August of last year.  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 CT angiography revealed 60% bilateral ICA stenosis though MRI of the brain did not show any acute stroke. She was seen by Dr. Trula Slade , suggested endarterectomy.she is on dual antiplatelet therapy because of her DES. I believe she would be extremely high risk for surgical revascularization. She may be a candidate for carotid artery stenting but this time with all her comorbidities I think conservative care it would be most appropriate      Lorretta Harp MD [facility], [facility] 03/15/2014 12:24 PM

## 2014-03-15 NOTE — Assessment & Plan Note (Signed)
History of CAD status post coronary bypass grafting in 1995 as well as left subclavian artery stenting. She has moderate LV dysfunction. She has had multiple interventions including most recently an DES placed to her RCA SVG by Dr. Ellyn Hack in August of last year.

## 2014-03-16 ENCOUNTER — Other Ambulatory Visit (HOSPITAL_COMMUNITY): Payer: Self-pay | Admitting: Cardiovascular Disease

## 2014-03-18 NOTE — Telephone Encounter (Signed)
Rx was sent to pharmacy electronically. 

## 2014-03-25 ENCOUNTER — Other Ambulatory Visit: Payer: Self-pay | Admitting: Cardiovascular Disease

## 2014-03-25 NOTE — Telephone Encounter (Signed)
Rx was sent to pharmacy electronically. 

## 2014-03-26 ENCOUNTER — Ambulatory Visit: Payer: Medicaid Other | Admitting: Cardiovascular Disease

## 2014-03-29 ENCOUNTER — Telehealth: Payer: Self-pay | Admitting: *Deleted

## 2014-03-29 NOTE — Telephone Encounter (Signed)
PA for zetia was filled out and sent in for review. [name] is currently taking lipitor 80mg  in addition to zetia to get closer to her cholesterol goal.

## 2014-04-03 NOTE — Telephone Encounter (Signed)
Pa was approved and faxed to [facility]

## 2014-04-22 ENCOUNTER — Telehealth: Payer: Self-pay | Admitting: Internal Medicine

## 2014-04-22 NOTE — Telephone Encounter (Signed)
returned pt call and confirmed appts...pt ok and aware

## 2014-04-24 ENCOUNTER — Other Ambulatory Visit: Payer: Medicaid Other

## 2014-04-24 ENCOUNTER — Telehealth: Payer: Self-pay | Admitting: Internal Medicine

## 2014-04-24 ENCOUNTER — Telehealth: Payer: Self-pay | Admitting: *Deleted

## 2014-04-24 ENCOUNTER — Ambulatory Visit: Payer: Medicaid Other | Admitting: Internal Medicine

## 2014-04-24 NOTE — Telephone Encounter (Signed)
CT scan was not scheduled, lab and f/u cancelled for today.  Onc tx scheduled filled out.

## 2014-04-24 NOTE — Telephone Encounter (Signed)
lvm for pt regarding to Dec appt....mailed pt appt sched/[facility] and letter

## 2014-04-29 ENCOUNTER — Encounter: Payer: Self-pay | Admitting: Cardiovascular Disease

## 2014-04-29 ENCOUNTER — Encounter: Payer: Self-pay | Admitting: *Deleted

## 2014-04-30 ENCOUNTER — Other Ambulatory Visit: Payer: Self-pay | Admitting: Internal Medicine

## 2014-04-30 DIAGNOSIS — R197 Diarrhea, unspecified: Secondary | ICD-10-CM

## 2014-04-30 DIAGNOSIS — R1084 Generalized abdominal pain: Secondary | ICD-10-CM

## 2014-05-02 ENCOUNTER — Encounter (HOSPITAL_COMMUNITY): Payer: Self-pay | Admitting: Cardiology

## 2014-05-02 ENCOUNTER — Other Ambulatory Visit: Payer: Medicaid Other

## 2014-05-03 ENCOUNTER — Other Ambulatory Visit: Payer: Medicaid Other

## 2014-05-06 ENCOUNTER — Telehealth: Payer: Self-pay | Admitting: Cardiovascular Disease

## 2014-05-06 NOTE — Telephone Encounter (Signed)
Melissa with [facility] had a question on this patient for [facility]. She is aware that Curt Bears is out of the office in a meeting all week but she has been communicating  with her through [facility], she will contact kathryn for what she needs.

## 2014-05-08 ENCOUNTER — Ambulatory Visit (HOSPITAL_COMMUNITY)
Admission: RE | Admit: 2014-05-08 | Discharge: 2014-05-08 | Disposition: A | Discharge status: Home / Self Care | Payer: Medicaid Other | Source: Ambulatory Visit | Attending: Internal Medicine | Admitting: Internal Medicine

## 2014-05-08 ENCOUNTER — Other Ambulatory Visit (HOSPITAL_BASED_OUTPATIENT_CLINIC_OR_DEPARTMENT_OTHER): Payer: Medicaid Other

## 2014-05-08 DIAGNOSIS — Z9221 Personal history of antineoplastic chemotherapy: Secondary | ICD-10-CM | POA: Insufficient documentation

## 2014-05-08 DIAGNOSIS — C3410 Malignant neoplasm of upper lobe, unspecified bronchus or lung: Secondary | ICD-10-CM

## 2014-05-08 DIAGNOSIS — Z923 Personal history of irradiation: Secondary | ICD-10-CM | POA: Insufficient documentation

## 2014-05-08 DIAGNOSIS — R0602 Shortness of breath: Secondary | ICD-10-CM | POA: Diagnosis not present

## 2014-05-08 DIAGNOSIS — C801 Malignant (primary) neoplasm, unspecified: Secondary | ICD-10-CM | POA: Insufficient documentation

## 2014-05-08 DIAGNOSIS — R05 Cough: Secondary | ICD-10-CM | POA: Diagnosis not present

## 2014-05-08 DIAGNOSIS — IMO0002 Reserved for concepts with insufficient information to code with codable children: Secondary | ICD-10-CM

## 2014-05-08 LAB — CBC WITH DIFFERENTIAL/PLATELET
BASO%: 0.3 % (ref 0.0–2.0)
BASOS ABS: 0 10*3/uL (ref 0.0–0.1)
EOS ABS: 0.1 10*3/uL (ref 0.0–0.5)
EOS%: 1.5 % (ref 0.0–7.0)
HEMATOCRIT: 38.3 % (ref 34.8–46.6)
HGB: 12.9 g/dL (ref 11.6–15.9)
LYMPH%: 19.7 % (ref 14.0–49.7)
MCH: 32.3 pg (ref 25.1–34.0)
MCHC: 33.7 g/dL (ref 31.5–36.0)
MCV: 96 fL (ref 79.5–101.0)
MONO#: 0.5 10*3/uL (ref 0.1–0.9)
MONO%: 8.3 % (ref 0.0–14.0)
NEUT%: 70.2 % (ref 38.4–76.8)
NEUTROS ABS: 4.3 10*3/uL (ref 1.5–6.5)
PLATELETS: 228 10*3/uL (ref 145–400)
RBC: 3.99 10*6/uL (ref 3.70–5.45)
RDW: 13 % (ref 11.2–14.5)
WBC: 6.2 10*3/uL (ref 3.9–10.3)
lymph#: 1.2 10*3/uL (ref 0.9–3.3)

## 2014-05-08 LAB — COMPREHENSIVE METABOLIC PANEL (CC13)
ALK PHOS: 93 U/L (ref 40–150)
ALT: 16 U/L (ref 0–55)
AST: 15 U/L (ref 5–34)
Albumin: 3.8 g/dL (ref 3.5–5.0)
Anion Gap: 9 mEq/L (ref 3–11)
BUN: 9.6 mg/dL (ref 7.0–26.0)
CHLORIDE: 104 meq/L (ref 98–109)
CO2: 28 mEq/L (ref 22–29)
CREATININE: 0.7 mg/dL (ref 0.6–1.1)
Calcium: 9.3 mg/dL (ref 8.4–10.4)
EGFR: >90 mL/min/{1.73_m2} (ref >90)
Glucose: 94 mg/dl (ref 70–140)
Potassium: 3.9 mEq/L (ref 3.5–5.1)
Sodium: 142 mEq/L (ref 136–145)
Total Bilirubin: 0.41 mg/dL (ref 0.20–1.20)
Total Protein: 6.5 g/dL (ref 6.4–8.3)

## 2014-05-08 MED ORDER — IOHEXOL 300 MG/ML  SOLN
60.0000 mL | Freq: Once | INTRAMUSCULAR | Status: AC | PRN
  Administered 2014-05-08: 60 mL via INTRAVENOUS

## 2014-05-10 ENCOUNTER — Other Ambulatory Visit: Payer: Self-pay | Admitting: Pulmonary Disease

## 2014-05-13 ENCOUNTER — Encounter: Payer: Self-pay | Admitting: *Deleted

## 2014-05-14 ENCOUNTER — Telehealth: Payer: Self-pay | Admitting: Pulmonary Disease

## 2014-05-14 ENCOUNTER — Telehealth: Payer: Self-pay | Admitting: *Deleted

## 2014-05-14 NOTE — Telephone Encounter (Signed)
Patient Instructions     Anoro one puff daily Stop spiriva and symbicort while using anoro   (instructions from last ov) ------------------------------------------------------  Spoke with pt, states she went to get her spiriva refilled and was told to call our office.  I advised her that she was to stop spiriva and symbicort at last visit and start the anoro.  She states that this med does not work well for her, so she stopped the anoro and has been using the spiriva since her last visit.  Needs a new refill.    Last ov:12/31/13 Next ov: recall for 06/2014 with VS.  Since VS is on vacation, will send to doc of day.  Dr. Lenna Gilford, ok to refill pt's spiriva?  Thanks!

## 2014-05-14 NOTE — Telephone Encounter (Signed)
Per SN- Ok to refill the spiriva and will need to start the pt on breo, 1 puff once daily and rinse mouth out after each use. Pt will also need to f/u in 4 months with VS.  lmtcb for pt. Pt has recall for 06/2014.

## 2014-05-14 NOTE — Telephone Encounter (Signed)
[facility], [facility] and [facility] do not accept Medicaid for her home health referral.  I left message for patient and advised her to follow up with her oncologist for additional referrals for aide if needed...maybe their social work team can help in some way...hospice appropriate.????

## 2014-05-15 ENCOUNTER — Telehealth: Payer: Self-pay | Admitting: Internal Medicine

## 2014-05-15 ENCOUNTER — Ambulatory Visit (HOSPITAL_BASED_OUTPATIENT_CLINIC_OR_DEPARTMENT_OTHER): Payer: Medicaid Other | Admitting: Internal Medicine

## 2014-05-15 ENCOUNTER — Encounter: Payer: Self-pay | Admitting: Internal Medicine

## 2014-05-15 VITALS — BP 118/53 | HR 73 | Temp 98.3°F | Resp 20 | Ht 67.0 in | Wt 95.5 lb

## 2014-05-15 DIAGNOSIS — Z85118 Personal history of other malignant neoplasm of bronchus and lung: Secondary | ICD-10-CM

## 2014-05-15 DIAGNOSIS — IMO0002 Reserved for concepts with insufficient information to code with codable children: Secondary | ICD-10-CM

## 2014-05-15 MED ORDER — FLUTICASONE FUROATE-VILANTEROL 100-25 MCG/INH IN AEPB: 1.0000 | INHALATION_SPRAY | Freq: Every day | RESPIRATORY_TRACT | Status: DC

## 2014-05-15 MED ORDER — TIOTROPIUM BROMIDE MONOHYDRATE 18 MCG IN CAPS: 18.0000 ug | ORAL_CAPSULE | Freq: Every day | RESPIRATORY_TRACT | Status: DC

## 2014-05-15 NOTE — Telephone Encounter (Signed)
Gave avs & cal for June.

## 2014-05-15 NOTE — Telephone Encounter (Signed)
LMTCB x 2 to advise the pt of below message. Sunshine Bing, CMA

## 2014-05-15 NOTE — Telephone Encounter (Signed)
Pt has returned call - [phone]

## 2014-05-15 NOTE — Telephone Encounter (Signed)
Pt aware of rec's per SN to restart Spiriva and start breo -- Rx for Spiriva and Breo are being sent to her pharmacy, explained to pt how to use Breo(works same as Anoro).  Nothing further needed.

## 2014-05-15 NOTE — Progress Notes (Signed)
[place] [phone]   Fax:([phone]) [place], [place] [address]  DIAGNOSIS: Stage IIIA (T2a, N2, MX) non-small cell lung cancer, squamous cell carcinoma diagnosed in September of 2013.   PRIOR THERAPY: Status post a course of concurrent chemoradiation with chemotherapy in the form of weekly carboplatin for AUC of 2 and paclitaxel at 45 mg/m2. The paclitaxel was discontinued after cycle #5 secondary to peripheral neuropathy. Last dose was given on 04/10/2012 with partial response.  CURRENT THERAPY: None  INTERVAL HISTORY: [name] 63 y.o. female returns to the clinic today for follow up visit accompanied by her daughter. Unfortunately she lost her husband 6 months ago. The patient continues to have shortness of breath with exertion and mild cough. She continues to smoke and unwilling to quit smoking. She denied having any significant chest pain or hemoptysis. The patient denied having any significant weight loss or night sweats. She has no nausea or vomiting. She had repeat CT scan of the chest performed recently and she is here for evaluation and discussion of her scan results.   MEDICAL HISTORY: Past Medical History  Diagnosis Date  . Acute myocardial infarction, unspecified site, episode of care unspecified 2011    VF arrest. Ruled in, cath with no clear culprit lesion, inferior WMA on LV gram, medical therapy  . Esophageal dysmotility   . Hemorrhoids   . Esophageal stricture   . Diaphragmatic hernia without mention of obstruction or gangrene   . 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 CAD (coronary artery disease)     CABG'96., Xience DES -VG-RCA (01/12/13)  . Peripheral neuropathy   . Hyperlipidemia   . Peptic ulcer, unspecified site, unspecified as acute or chronic, without mention of hemorrhage, perforation, or obstruction   . Depressive disorder, not elsewhere  classified   . Anxiety state, unspecified   . COPD (chronic obstructive pulmonary disease)   . GERD (gastroesophageal reflux disease)   . Candida esophagitis     on multiple occasions  . C. difficile diarrhea   . PVD (peripheral vascular disease)   . Headache(784.0)   . Weight loss 06/13/2011    scheduled here by family doctor  . CHF (congestive heart failure) 2011  . Asthma   . Hypertension   . History of nuclear stress test 07/14/2010    dipyriadmole; normal pattern of perfusion, low risk   . History of tobacco use     quit 05/2009  . Non-small cell carcinoma of lung, stage 3     Dr. Roxan Hockey  . History of radiation therapy 02/28/2012-04/14/2012    right lung  . Bilateral carotid artery disease   . TIA (transient ischemic attack)     amaurosis fugax  . Tubular adenoma of colon     ALLERGIES:  is allergic to amiodarone; clopidogrel bisulfate; dilaudid; and multaq.  MEDICATIONS:  Current Outpatient Prescriptions  Medication Sig Dispense Refill  . albuterol (PROVENTIL HFA;VENTOLIN HFA) 108 (90 BASE) MCG/ACT inhaler Inhale 2 puffs into the lungs every 6 (six) hours as needed for wheezing or shortness of breath.    Marland Kitchen aspirin 81 MG EC tablet Take 81 mg by mouth daily.      Marland Kitchen atorvastatin (LIPITOR) 80 MG tablet Take 80 mg by mouth at bedtime.    . benzonatate (TESSALON) 100 MG capsule Take 1 capsule (100 mg total) by mouth 3 (three) times daily as needed for cough.  60 capsule 0  . Cholecalciferol (VITAMIN D-3) 5000 UNITS TABS Take 1 tablet by mouth daily.    . diazepam (VALIUM) 5 MG tablet Take 5 mg by mouth 2 (two) times daily.    Marland Kitchen EFFIENT 10 MG TABS tablet TAKE 1 TABLET BY MOUTH EVERY DAY 30 tablet 5  . famotidine (PEPCID) 20 MG tablet Take 20 mg by mouth at bedtime.    . fluconazole (DIFLUCAN) 100 MG tablet Take 1 tablet (100 mg total) by mouth daily. 3 tablet 0  . fluticasone (FLONASE) 50 MCG/ACT nasal spray Place 1 spray into both nostrils daily as needed for allergies or  rhinitis. 16 g 2  . HYDROcodone-acetaminophen (NORCO) 10-325 MG per tablet Take 1 tablet by mouth every 6 (six) hours as needed for moderate pain.     Marland Kitchen loratadine (CLARITIN) 10 MG tablet Take 1 tablet (10 mg total) by mouth daily as needed for allergies. 30 tablet 3  . montelukast (SINGULAIR) 10 MG tablet Take 10 mg by mouth at bedtime.    . nitroGLYCERIN (NITROSTAT) 0.4 MG SL tablet Place 0.4 mg under the tongue every 5 (five) minutes as needed. For chest pain    . omeprazole (PRILOSEC) 20 MG capsule Take 20 mg by mouth 2 (two) times daily before a meal.    . OXYGEN-HELIUM IN Inhale 4 L into the lungs continuous.    Marland Kitchen Umeclidinium-Vilanterol (ANORO ELLIPTA) 62.5-25 MCG/INH AEPB Inhale 1 puff into the lungs daily.    Marland Kitchen ZETIA 10 MG tablet TAKE 1 TABLET BY MOUTH EVERY DAY 30 tablet 11   No current facility-administered medications for this visit.    SURGICAL HISTORY:  Past Surgical History  Procedure Laterality Date  . Tonsillectomy  1974  . Fracture lt jaw  1976  . Breast surgery  1969, 1970    bilateral  . Epigastric hernia repair  2008    Dr Roxan Hockey  . Coronary artery bypass graft  02/01/1994    left internal mammary to LAD, vein graft to diagonal/post descending, post lateral, marginals, circumfle, diagonal  . Carpal tunnel release    . Total abdominal hysterectomy  1998  . Laminectomy    . Subclavian vein angioplasty / stenting Left   . Transthoracic echocardiogram  08/14/2009    EF=>55%, normal LV systolic function, mild hypokinesis of basal segments of inferolateral & anterolater walls; mild MR/TR; AV mildly sclerotic  . Coronary angioplasty with stent placement  08/29/2000    stent to RCA (Dr. Corky Downs)  . Cardiac catheterization  01/17/2002    patent grafts (Dr. Marella Chimes)  . Cardiac catheterization  02/13/2004    significant L main & 3 vessel CAD, patents grafts, mild distal aortic disease (Dr. Gerrie Nordmann)  . Cardiac catheterization  07/14/2005    patent grafts, normal LV  function (Dr. Adora Fridge)  . Cardiac catheterization  09/26/2008    patent grafts, normal LV function (Dr. Adora Fridge)  . Cardiac catheterization  05/31/2009    patent grafts, inferior wall motion abnormality (Dr. Adora Fridge)  . Cardiac catheterization  10/03/2009    normal LV function; 100% native RCA, circumflex, distal LAD occlusion  . Coronary angioplasty with stent placement  01/12/2013    Xience DES to SVG-RCA distal  . Carotid doppler  08/14/2009    R & L ICAs - 0-49% diameter reduction   . Left heart catheterization with coronary/graft angiogram N/A 01/12/2013    Procedure: LEFT HEART CATHETERIZATION WITH Beatrix Fetters;  Surgeon: Leonie Man, MD;  Location: [facility];  Service: Cardiovascular;  Laterality: N/A;    REVIEW OF SYSTEMS:  A comprehensive review of systems was negative except for: Respiratory: positive for cough and dyspnea on exertion   PHYSICAL EXAMINATION: General appearance: alert, cooperative and no distress Head: Normocephalic, without obvious abnormality, atraumatic Neck: no adenopathy, no JVD and thyroid not enlarged, symmetric, no tenderness/mass/nodules Lymph nodes: Cervical, supraclavicular, and axillary nodes normal. Resp: rhonchi bilaterally and wheezes bilaterally Cardio: regular rate and rhythm, S1, S2 normal, no murmur, click, rub or gallop GI: soft, non-tender; bowel sounds normal; no masses,  no organomegaly Extremities: extremities normal, atraumatic, no cyanosis or edema  ECOG PERFORMANCE STATUS: 1 - Symptomatic but completely ambulatory  Blood pressure 118/53, pulse 73, temperature 98.3 F (36.8 C), temperature source Oral, resp. rate 20, height 5\' 7"  (1.702 m), weight 95 lb 8 oz (43.319 kg), SpO2 100 %.  LABORATORY DATA: Lab Results  Component Value Date   WBC 6.2 05/08/2014   HGB 12.9 05/08/2014   HCT 38.3 05/08/2014   MCV 96.0 05/08/2014   PLT 228 05/08/2014      Chemistry      Component Value Date/Time   NA 142 05/08/2014 1058    NA 141 03/04/2014 0700   K 3.9 05/08/2014 1058   K 4.1 03/04/2014 0700   CL 101 03/04/2014 0700   CL 104 11/07/2012 0839   CO2 28 05/08/2014 1058   CO2 27 03/04/2014 0700   BUN 9.6 05/08/2014 1058   BUN 9 03/04/2014 0700   CREATININE 0.7 05/08/2014 1058   CREATININE 0.51 03/04/2014 0700      Component Value Date/Time   CALCIUM 9.3 05/08/2014 1058   CALCIUM 9.1 03/04/2014 0700   ALKPHOS 93 05/08/2014 1058   ALKPHOS 117 03/03/2014 1147   AST 15 05/08/2014 1058   AST 21 03/03/2014 1147   ALT 16 05/08/2014 1058   ALT 16 03/03/2014 1147   BILITOT 0.41 05/08/2014 1058   BILITOT 0.4 03/03/2014 1147       RADIOGRAPHIC STUDIES: Ct Chest W Contrast  05/08/2014   CLINICAL DATA:  Followup right lung squamous cell carcinoma. Previous chemotherapy and radiation therapy. Shortness of breath. Cough.  EXAM: CT CHEST WITH CONTRAST  TECHNIQUE: Multidetector CT imaging of the chest was performed during intravenous contrast administration.  CONTRAST:  11mL OMNIPAQUE IOHEXOL 300 MG/ML  SOLN  COMPARISON:  10/05/2013  FINDINGS: Mediastinum/Hilar Regions: No masses or pathologically enlarged lymph nodes identified.  Other Thoracic Lymphadenopathy:  None.  Lungs: 9 mm spiculated nodule in the posterior right upper lobe on image 19 is unchanged in size. Peripheral subpleural density extending to the major fissure which measures 8 x 17 mm on image 17 is also unchanged. No other suspicious pulmonary nodules identified. Emphysema again noted.  Pleura:  No evidence of effusion or mass.  Vascular/Cardiac:  No acute findings identified.  Other:  None.  Musculoskeletal:  No suspicious bone lesions identified.  IMPRESSION: Stable spiculated right upper lobe nodule and peripheral subpleural density along the major fissure.  No evidence of new or progressive disease within the thorax.   Electronically Signed   By: Earle Gell M.D.   On: 05/08/2014 14:24   ASSESSMENT AND PLAN: this is a very pleasant 63 years old white  female with history of stage IIIA non-small cell lung cancer status post concurrent chemoradiation with weekly carboplatin and paclitaxel. She has been observation since November of 2013 with no evidence for disease progression.  The recent CT scan of  the chest showed no evidence for new or progressive disease within the chest but there was stable spiculated right upper lobe nodule. I discussed the scan results with the patient and recommended for her to continue on observation with repeat CT scan of the chest in 6 months.  She was advised to call immediately if she has any concerning symptoms in the interval.   The patient voices understanding of current disease status and treatment options and is in agreement with the current care plan.  All questions were answered. The patient knows to call the clinic with any problems, questions or concerns. We can certainly see the patient much sooner if necessary.  Disclaimer: This note was dictated with voice recognition software. Similar sounding words can inadvertently be transcribed and may not be corrected upon review.

## 2014-05-22 ENCOUNTER — Telehealth: Payer: Self-pay

## 2014-05-22 MED ORDER — FLUTICASONE FUROATE-VILANTEROL 100-25 MCG/INH IN AEPB: 1.0000 | INHALATION_SPRAY | Freq: Every day | RESPIRATORY_TRACT | Status: DC

## 2014-05-22 NOTE — Telephone Encounter (Signed)
PA for Breo 100- approved through 05/23/2015- approval # [id number].  Spoke with N[place] at [facility].    Med sent to pharmacy. Pt aware. Nothing further needed.

## 2014-06-18 ENCOUNTER — Ambulatory Visit: Payer: Medicaid Other | Admitting: Internal Medicine

## 2014-06-19 ENCOUNTER — Emergency Department (HOSPITAL_COMMUNITY): Payer: Medicaid Other

## 2014-06-19 ENCOUNTER — Emergency Department (HOSPITAL_COMMUNITY)
Admission: EM | Admit: 2014-06-19 | Discharge: 2014-06-19 | Disposition: A | Discharge status: Home / Self Care | Payer: Medicaid Other | Attending: Emergency Medicine | Admitting: Emergency Medicine

## 2014-06-19 ENCOUNTER — Encounter (HOSPITAL_COMMUNITY): Payer: Self-pay | Admitting: *Deleted

## 2014-06-19 DIAGNOSIS — R000 Tachycardia, unspecified: Secondary | ICD-10-CM | POA: Diagnosis not present

## 2014-06-19 DIAGNOSIS — I509 Heart failure, unspecified: Secondary | ICD-10-CM | POA: Insufficient documentation

## 2014-06-19 DIAGNOSIS — Z8673 Personal history of transient ischemic attack (TIA), and cerebral infarction without residual deficits: Secondary | ICD-10-CM | POA: Diagnosis not present

## 2014-06-19 DIAGNOSIS — Z9861 Coronary angioplasty status: Secondary | ICD-10-CM | POA: Diagnosis not present

## 2014-06-19 DIAGNOSIS — I252 Old myocardial infarction: Secondary | ICD-10-CM | POA: Insufficient documentation

## 2014-06-19 DIAGNOSIS — E785 Hyperlipidemia, unspecified: Secondary | ICD-10-CM | POA: Diagnosis not present

## 2014-06-19 DIAGNOSIS — Z86010 Personal history of colonic polyps: Secondary | ICD-10-CM | POA: Diagnosis not present

## 2014-06-19 DIAGNOSIS — Z923 Personal history of irradiation: Secondary | ICD-10-CM | POA: Insufficient documentation

## 2014-06-19 DIAGNOSIS — Z8669 Personal history of other diseases of the nervous system and sense organs: Secondary | ICD-10-CM | POA: Insufficient documentation

## 2014-06-19 DIAGNOSIS — Z85118 Personal history of other malignant neoplasm of bronchus and lung: Secondary | ICD-10-CM | POA: Insufficient documentation

## 2014-06-19 DIAGNOSIS — K219 Gastro-esophageal reflux disease without esophagitis: Secondary | ICD-10-CM | POA: Diagnosis not present

## 2014-06-19 DIAGNOSIS — I2510 Atherosclerotic heart disease of native coronary artery without angina pectoris: Secondary | ICD-10-CM | POA: Diagnosis not present

## 2014-06-19 DIAGNOSIS — Z87891 Personal history of nicotine dependence: Secondary | ICD-10-CM | POA: Insufficient documentation

## 2014-06-19 DIAGNOSIS — Z9889 Other specified postprocedural states: Secondary | ICD-10-CM | POA: Insufficient documentation

## 2014-06-19 DIAGNOSIS — F419 Anxiety disorder, unspecified: Secondary | ICD-10-CM | POA: Insufficient documentation

## 2014-06-19 DIAGNOSIS — Z951 Presence of aortocoronary bypass graft: Secondary | ICD-10-CM | POA: Diagnosis not present

## 2014-06-19 DIAGNOSIS — Z7982 Long term (current) use of aspirin: Secondary | ICD-10-CM | POA: Insufficient documentation

## 2014-06-19 DIAGNOSIS — Z79899 Other long term (current) drug therapy: Secondary | ICD-10-CM | POA: Insufficient documentation

## 2014-06-19 DIAGNOSIS — R0602 Shortness of breath: Secondary | ICD-10-CM | POA: Diagnosis present

## 2014-06-19 DIAGNOSIS — I10 Essential (primary) hypertension: Secondary | ICD-10-CM | POA: Insufficient documentation

## 2014-06-19 DIAGNOSIS — J441 Chronic obstructive pulmonary disease with (acute) exacerbation: Secondary | ICD-10-CM | POA: Insufficient documentation

## 2014-06-19 DIAGNOSIS — Z8619 Personal history of other infectious and parasitic diseases: Secondary | ICD-10-CM | POA: Diagnosis not present

## 2014-06-19 LAB — BASIC METABOLIC PANEL
Anion gap: 10 (ref 5–15)
BUN: 8 mg/dL (ref 6–23)
CO2: 30 mmol/L (ref 19–32)
Calcium: 9.7 mg/dL (ref 8.4–10.5)
Chloride: 97 mmol/L (ref 96–112)
Creatinine, Ser: 0.54 mg/dL (ref 0.50–1.10)
GFR calc Af Amer: >90 mL/min (ref >90)
GFR calc non Af Amer: >90 mL/min (ref >90)
Glucose, Bld: 116 mg/dL — ABNORMAL HIGH (ref 70–99)
Potassium: 4.1 mmol/L (ref 3.5–5.1)
Sodium: 137 mmol/L (ref 135–145)

## 2014-06-19 LAB — CBC
HEMATOCRIT: 36.9 % (ref 36.0–46.0)
Hemoglobin: 12.3 g/dL (ref 12.0–15.0)
MCH: 31.6 pg (ref 26.0–34.0)
MCHC: 33.3 g/dL (ref 30.0–36.0)
MCV: 94.9 fL (ref 78.0–100.0)
Platelets: 217 10*3/uL (ref 150–400)
RBC: 3.89 MIL/uL (ref 3.87–5.11)
RDW: 12.8 % (ref 11.5–15.5)
WBC: 9.6 10*3/uL (ref 4.0–10.5)

## 2014-06-19 LAB — BRAIN NATRIURETIC PEPTIDE: B NATRIURETIC PEPTIDE 5: 63.3 pg/mL (ref 0.0–100.0)

## 2014-06-19 LAB — TROPONIN I

## 2014-06-19 MED ORDER — IOHEXOL 350 MG/ML SOLN
80.0000 mL | Freq: Once | INTRAVENOUS | Status: AC | PRN
  Administered 2014-06-19: 80 mL via INTRAVENOUS

## 2014-06-19 MED ORDER — IPRATROPIUM BROMIDE 0.02 % IN SOLN
0.5000 mg | Freq: Once | RESPIRATORY_TRACT | Status: AC
  Administered 2014-06-19: 0.5 mg via RESPIRATORY_TRACT
  Filled 2014-06-19: qty 2.5

## 2014-06-19 MED ORDER — MORPHINE SULFATE 4 MG/ML IJ SOLN
4.0000 mg | Freq: Once | INTRAMUSCULAR | Status: AC
  Administered 2014-06-19: 4 mg via INTRAVENOUS
  Filled 2014-06-19: qty 1

## 2014-06-19 MED ORDER — AZITHROMYCIN 250 MG PO TABS: 250.0000 mg | ORAL_TABLET | Freq: Every day | ORAL | Status: DC

## 2014-06-19 MED ORDER — ALBUTEROL SULFATE (2.5 MG/3ML) 0.083% IN NEBU
5.0000 mg | INHALATION_SOLUTION | Freq: Once | RESPIRATORY_TRACT | Status: AC
  Administered 2014-06-19: 5 mg via RESPIRATORY_TRACT
  Filled 2014-06-19: qty 6

## 2014-06-19 MED ORDER — PREDNISONE 20 MG PO TABS: ORAL_TABLET | ORAL | Status: DC

## 2014-06-19 MED ORDER — METHYLPREDNISOLONE SODIUM SUCC 125 MG IJ SOLR
125.0000 mg | Freq: Once | INTRAMUSCULAR | Status: AC
  Administered 2014-06-19: 125 mg via INTRAVENOUS
  Filled 2014-06-19: qty 2

## 2014-06-19 NOTE — Discharge Instructions (Signed)
Take prednisone as directed for the next 6 days along with azithromycin for the next 5 days.  Chronic Obstructive Pulmonary Disease Exacerbation Chronic obstructive pulmonary disease (COPD) is a common lung condition in which airflow from the lungs is limited. COPD is a general term that can be used to describe many different lung problems that limit airflow, including chronic bronchitis and emphysema. COPD exacerbations are episodes when breathing symptoms become much worse and require extra treatment. Without treatment, COPD exacerbations can be life threatening, and frequent COPD exacerbations can cause further damage to your lungs. CAUSES   Respiratory infections.   Exposure to smoke.   Exposure to air pollution, chemical fumes, or dust. Sometimes there is no apparent cause or trigger. RISK FACTORS  Smoking cigarettes.  Older age.  Frequent prior COPD exacerbations. SIGNS AND SYMPTOMS   Increased coughing.   Increased thick spit (sputum) production.   Increased wheezing.   Increased shortness of breath.   Rapid breathing.   Chest tightness. DIAGNOSIS  Your medical history, a physical exam, and tests will help your health care provider make a diagnosis. Tests may include:  A chest X-ray.  Basic lab tests.  Sputum testing.  An arterial blood gas test. TREATMENT  Depending on the severity of your COPD exacerbation, you may need to be admitted to a hospital for treatment. Some of the treatments commonly used to treat COPD exacerbations are:   Antibiotic medicines.   Bronchodilators. These are drugs that expand the air passages. They may be given with an inhaler or nebulizer. Spacer devices may be needed to help improve drug delivery.  Corticosteroid medicines.  Supplemental oxygen therapy.  HOME CARE INSTRUCTIONS   Do not smoke. Quitting smoking is very important to prevent COPD from getting worse and exacerbations from happening as often.  Avoid  exposure to all substances that irritate the airway, especially to tobacco smoke.   If you were prescribed an antibiotic medicine, finish it all even if you start to feel better.  Take all medicines as directed by your health care provider.It is important to use correct technique with inhaled medicines.  Drink enough fluids to keep your urine clear or pale yellow (unless you have a medical condition that requires fluid restriction).  Use a cool mist vaporizer. This makes it easier to clear your chest when you cough.   If you have a home nebulizer and oxygen, continue to use them as directed.   Maintain all necessary vaccinations to prevent infections.   Exercise regularly.   Eat a healthy diet.   Keep all follow-up appointments as directed by your health care provider. SEEK IMMEDIATE MEDICAL CARE IF:  You have worsening shortness of breath.   You have trouble talking.   You have severe chest pain.  You have blood in your sputum.  You have a fever.  You have weakness, vomit repeatedly, or faint.   You feel confused.   You continue to get worse. MAKE SURE YOU:   Understand these instructions.  Will watch your condition.  Will get help right away if you are not doing well or get worse. Document Released: 03/07/2007 Document Revised: 09/24/2013 Document Reviewed: 01/12/2013 [facility] Patient Information 2015 [place], [place]. This information is not intended to replace advice given to you by your health care provider. Make sure you discuss any questions you have with your health care provider.

## 2014-06-19 NOTE — ED Notes (Signed)
Attempted to take pt off of 4L of oxygen, pt didn't do well. Pt requested pain meds for a 9 out of 10 back pain, and family left to get pts home oxygen.

## 2014-06-19 NOTE — ED Notes (Signed)
Per [facility], pt has a hx of Lung CA, and COPD, She has been coughing for 1 week without any relief. Pt has used her home nebulizer and states that it isn't working. Pt thought it was time to come to Ed for further eval. Productive cough but to weak to get much up. Pt is on $L of oxygen at home.  Pt was given a albuterol neb otw to Hospital and VS are as follows: B/P: 125/83, HR:91, Resp: 22, O2 sat: 98% on 4L, And CBG:88

## 2014-06-19 NOTE — ED Notes (Signed)
Patient transported to X-ray 

## 2014-06-19 NOTE — ED Notes (Signed)
Ct notified pt has 20 gauge IV placed for CT.

## 2014-06-19 NOTE — ED Provider Notes (Signed)
CSN: [id number]     Arrival date & time 06/19/14  1156 History   First MD Initiated Contact with Patient 06/19/14 1156     Chief Complaint  Patient presents with  . Shortness of Breath     (Consider location/radiation/quality/duration/timing/severity/associated sxs/prior Treatment) HPI Comments: 64 year old female with an extensive past medical history presenting to the ED via EMS complaining of shortness of breath over the past few weeks, worsening over the past few days. Admits to associated productive cough with phlegm 1 week. States the cough is constant, unrelieved by nebulizer at home. Ate she is feeling weak. She is on 4 L O2 24/7 at home. She was given an albuterol nebulizer en route to the ED with no relief. States she had a subjective fever 2 nights ago. No chest pain, nausea or vomiting.  Patient is a 64 y.o. female presenting with shortness of breath. The history is provided by the patient.  Shortness of Breath Associated symptoms: cough and fever     Past Medical History  Diagnosis Date  . Acute myocardial infarction, unspecified site, episode of care unspecified 2011    VF arrest. Ruled in, cath with no clear culprit lesion, inferior WMA on LV gram, medical therapy  . Esophageal dysmotility   . Hemorrhoids   . Esophageal stricture   . Diaphragmatic hernia without mention of obstruction or gangrene   . 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 CAD (coronary artery disease)     CABG'96., Xience DES -VG-RCA (01/12/13)  . Peripheral neuropathy   . Hyperlipidemia   . Peptic ulcer, unspecified site, unspecified as acute or chronic, without mention of hemorrhage, perforation, or obstruction   . Depressive disorder, not elsewhere classified   . Anxiety state, unspecified   . COPD (chronic obstructive pulmonary disease)   . GERD (gastroesophageal reflux disease)   . Candida esophagitis     on multiple occasions  . C. difficile diarrhea   . PVD (peripheral  vascular disease)   . Headache(784.0)   . Weight loss 06/13/2011    scheduled here by family doctor  . CHF (congestive heart failure) 2011  . Asthma   . Hypertension   . History of nuclear stress test 07/14/2010    dipyriadmole; normal pattern of perfusion, low risk   . History of tobacco use     quit 05/2009  . Non-small cell carcinoma of lung, stage 3     Dr. Roxan Hockey  . History of radiation therapy 02/28/2012-04/14/2012    right lung  . Bilateral carotid artery disease   . TIA (transient ischemic attack)     amaurosis fugax  . Tubular adenoma of colon    Past Surgical History  Procedure Laterality Date  . Tonsillectomy  1974  . Fracture lt jaw  1976  . Breast surgery  1969, 1970    bilateral  . Epigastric hernia repair  2008    Dr Roxan Hockey  . Coronary artery bypass graft  02/01/1994    left internal mammary to LAD, vein graft to diagonal/post descending, post lateral, marginals, circumfle, diagonal  . Carpal tunnel release    . Total abdominal hysterectomy  1998  . Laminectomy    . Subclavian vein angioplasty / stenting Left   . Transthoracic echocardiogram  08/14/2009    EF=>55%, normal LV systolic function, mild hypokinesis of basal segments of inferolateral & anterolater walls; mild MR/TR; AV mildly sclerotic  . Coronary angioplasty with stent placement  08/29/2000    stent to RCA (  Dr. Corky Downs)  . Cardiac catheterization  01/17/2002    patent grafts (Dr. Marella Chimes)  . Cardiac catheterization  02/13/2004    significant L main & 3 vessel CAD, patents grafts, mild distal aortic disease (Dr. Gerrie Nordmann)  . Cardiac catheterization  07/14/2005    patent grafts, normal LV function (Dr. Adora Fridge)  . Cardiac catheterization  09/26/2008    patent grafts, normal LV function (Dr. Adora Fridge)  . Cardiac catheterization  05/31/2009    patent grafts, inferior wall motion abnormality (Dr. Adora Fridge)  . Cardiac catheterization  10/03/2009    normal LV function; 100% native RCA,  circumflex, distal LAD occlusion  . Coronary angioplasty with stent placement  01/12/2013    Xience DES to SVG-RCA distal  . Carotid doppler  08/14/2009    R & L ICAs - 0-49% diameter reduction   . Left heart catheterization with coronary/graft angiogram N/A 01/12/2013    Procedure: LEFT HEART CATHETERIZATION WITH Beatrix Fetters;  Surgeon: Leonie Man, MD;  Location: [facility] CATH LAB;  Service: Cardiovascular;  Laterality: N/A;   Family History  Problem Relation Age of Onset  . Lung cancer Father     also CVA  . Heart disease Father   . Asthma Father   . Allergies Father   . Cancer Father   . Heart disease Mother     also CVA, MI  . Brain cancer Mother   . Heart disease Sister   . Colon cancer Neg Hx   . Lung cancer Brother 52    also ENT cancer  . Hypertension Brother 41    also aneurysm, early CAD  . Breast cancer Other     family hx   History  Substance Use Topics  . Smoking status: Former Smoker -- 1.50 packs/day for 30 years    Types: Cigarettes    Quit date: 05/24/2013  . Smokeless tobacco: Never Used  . Alcohol Use: No   OB History    No data available     Review of Systems  Constitutional: Positive for fever and fatigue.  Respiratory: Positive for cough and shortness of breath.   Neurological: Positive for weakness.  All other systems reviewed and are negative.     Allergies  Amiodarone; Clopidogrel bisulfate; Dilaudid; and Multaq  Home Medications   Prior to Admission medications   Medication Sig Start Date End Date Taking? Authorizing Provider  albuterol (PROVENTIL HFA;VENTOLIN HFA) 108 (90 BASE) MCG/ACT inhaler Inhale 2 puffs into the lungs every 6 (six) hours as needed for wheezing or shortness of breath.    Historical Provider, MD  aspirin 81 MG EC tablet Take 81 mg by mouth daily.      Historical Provider, MD  atorvastatin (LIPITOR) 80 MG tablet Take 80 mg by mouth at bedtime.    Historical Provider, MD  azithromycin (ZITHROMAX) 250 MG  tablet Take 1 tablet (250 mg total) by mouth daily. Take first 2 tablets together, then 1 every day until finished. 06/19/14   Chloeanne Poteet M Ranvir Renovato, PA-C  benzonatate (TESSALON) 100 MG capsule Take 1 capsule (100 mg total) by mouth 3 (three) times daily as needed for cough. 03/05/14   Ripudeep Krystal Eaton, MD  Cholecalciferol (VITAMIN D-3) 5000 UNITS TABS Take 1 tablet by mouth daily.    Historical Provider, MD  diazepam (VALIUM) 5 MG tablet Take 5 mg by mouth 2 (two) times daily.    Historical Provider, MD  EFFIENT 10 MG TABS tablet TAKE 1 TABLET BY  MOUTH EVERY DAY 03/18/14   Lorretta Harp, MD  famotidine (PEPCID) 20 MG tablet Take 20 mg by mouth at bedtime.    Historical Provider, MD  fluconazole (DIFLUCAN) 100 MG tablet Take 1 tablet (100 mg total) by mouth daily. 03/11/14   Tanda Rockers, MD  fluticasone (FLONASE) 50 MCG/ACT nasal spray Place 1 spray into both nostrils daily as needed for allergies or rhinitis. 03/05/14   Ripudeep Krystal Eaton, MD  Fluticasone Furoate-Vilanterol (BREO ELLIPTA) 100-25 MCG/INH AEPB Inhale 1 puff into the lungs daily. 05/22/14   Chesley Mires, MD  HYDROcodone-acetaminophen (NORCO) 10-325 MG per tablet Take 1 tablet by mouth every 6 (six) hours as needed for moderate pain.     Historical Provider, MD  loratadine (CLARITIN) 10 MG tablet Take 1 tablet (10 mg total) by mouth daily as needed for allergies. 03/05/14   Ripudeep Krystal Eaton, MD  montelukast (SINGULAIR) 10 MG tablet Take 10 mg by mouth at bedtime.    Historical Provider, MD  nitroGLYCERIN (NITROSTAT) 0.4 MG SL tablet Place 0.4 mg under the tongue every 5 (five) minutes as needed. For chest pain    Historical Provider, MD  omeprazole (PRILOSEC) 20 MG capsule Take 20 mg by mouth 2 (two) times daily before a meal.    Historical Provider, MD  OXYGEN-HELIUM IN Inhale 4 L into the lungs continuous.    Historical Provider, MD  predniSONE (DELTASONE) 20 MG tablet Take 3 tabs PO x 2 days followed by 2 tabs PO x 2 days followed by 1 tab PO x 2  days 06/19/14   Carman Ching, PA-C  tiotropium (SPIRIVA) 18 MCG inhalation capsule Place 1 capsule (18 mcg total) into inhaler and inhale daily. 05/15/14   Noralee Space, MD  ZETIA 10 MG tablet TAKE 1 TABLET BY MOUTH EVERY DAY 03/25/14   Lorretta Harp, MD   BP 142/78 mmHg  Pulse 107  Temp(Src) 98.9 F (37.2 C) (Oral)  Resp 21  SpO2 98% Physical Exam  Constitutional: She is oriented to person, place, and time. She appears well-developed. She appears cachectic.  Chronically ill-appearing.  HENT:  Head: Normocephalic and atraumatic.  Mouth/Throat: Oropharynx is clear and moist.  Eyes: Conjunctivae and EOM are normal. No scleral icterus.  Neck: Normal range of motion. Neck supple. No JVD present.  Cardiovascular: Regular rhythm and normal heart sounds.   Mild tachycardia.  Pulmonary/Chest: Effort normal.  Poor air movement. Scattered wheezes bilateral.  Musculoskeletal: Normal range of motion. She exhibits no edema.  Neurological: She is alert and oriented to person, place, and time. No sensory deficit.  Skin: Skin is warm and dry.  Psychiatric: She has a normal mood and affect. Her behavior is normal.  Nursing note and vitals reviewed.   ED Course  Procedures (including critical care time) Labs Review Labs Reviewed  BASIC METABOLIC PANEL - Abnormal; Notable for the following:    Glucose, Bld 116 (*)    All other components within normal limits  CBC  BRAIN NATRIURETIC PEPTIDE  TROPONIN I    Imaging Review Dg Chest 2 View  06/19/2014   CLINICAL DATA:  64 year old female with shortness of breath and chest pain for 1 week. Initial encounter. Current history of right lung squamous cell carcinoma.  EXAM: CHEST  2 VIEW  COMPARISON:  Chest CT 05/08/2014 and earlier.  FINDINGS: Chronic hyperinflation and hilar retraction. Stable cardiac size and mediastinal contours. No pneumothorax, pulmonary edema, pleural effusion or acute pulmonary opacity. Sequelae of CABG and  thoracolumbar  spinal fusion. Osteopenia. No acute osseous abnormality identified.  IMPRESSION: No acute cardiopulmonary abnormality identified. Chronic lung disease with hyperinflation.   Electronically Signed   By: Lars Pinks M.D.   On: 06/19/2014 13:38   Ct Angio Chest Pe W/cm &/or Wo Cm  06/19/2014   CLINICAL DATA:  Cough for 1 week.  EXAM: CT ANGIOGRAPHY CHEST WITH CONTRAST  TECHNIQUE: Multidetector CT imaging of the chest was performed using the standard protocol during bolus administration of intravenous contrast. Multiplanar CT image reconstructions and MIPs were obtained to evaluate the vascular anatomy.  CONTRAST:  59mL OMNIPAQUE IOHEXOL 350 MG/ML SOLN  COMPARISON:  10/05/2013, 06/05/2013, 04/25/2012  FINDINGS: Cardiovascular: There is good opacification of the pulmonary vasculature. There is no pulmonary embolism. The thoracic aorta is intact, with no aneurysm or dissection. There is prior sternotomy and CABG  Lungs: There is no interval change in the spiculated right upper lobe nodule, or in the adjacent subpleural major fissure thickening. There also is stable curvilinear scarring in the medial right apical region. No new or enlarging pulmonary nodules are evident.  Central airways: Patent  Effusions: None  Lymphadenopathy: Axillary, mediastinal and hilar regions are unremarkable with no evidence of pathologic adenopathy.  Esophagus: Unremarkable  Upper abdomen: Unremarkable  Musculoskeletal: Extensive thoracolumbar spinal fixation hardware, causing mild metal artifact.  Review of the MIP images confirms the above findings.  IMPRESSION: 1. No evidence of pulmonary embolism 2. No new or enlarging pulmonary parenchymal lesions 3. No acute intrathoracic findings   Electronically Signed   By: Andreas Newport M.D.   On: 06/19/2014 15:17     EKG Interpretation   Date/Time:  Wednesday June 19 2014 12:02:23 EST Ventricular Rate:  98 PR Interval:  116 QRS Duration: 82 QT Interval:  329 QTC Calculation: 420 R  Axis:   35 Text Interpretation:  Sinus tachycardia Atrial premature complexes  Borderline short PR interval Biatrial enlargement RSR' in V1 or V2,  probably normal variant No significant change since last tracing Confirmed  by Debby Freiberg [phone]) on 06/19/2014 12:46:52 PM      MDM   Final diagnoses:  COPD exacerbation   Unprotected cough. She is chronically ill-appearing, but nontoxic appearing and in no apparent distress. O2 sat 98% on 4 L of oxygen which she is on at all times. Poor air movement and scattered wheezes initially, no improvement with neb from ambulance. Patient given Solu-Medrol and DuoNeb with some improvement of breath sounds. Chest x-ray negative for pneumonia which was the initial suspected diagnosis. Given shortness of breath and tachycardia, cannot rule out PE. Cannot PERC negative. Will obtain CT angio chest.  CT angio negative for PE. Doubt cardiac sob given fever and cough. Pt states she is feeling better to go home. Will d/c home with prednisone taper and azithromycin for acute bronchitis with hx of chronic lung disease. States she feels well enough to go home and would rather not stay in the hospital. I think patient is stable enough for outpatient treatment, and is more at risk of developing pneumonia in the hospital. Stable for d/c. Return precautions given. Patient states understanding of treatment care plan and is agreeable.  Discussed with attending Dr. Colin Rhein who also evaluated patient and agrees with plan of care.   Carman Ching, PA-C 06/19/14 1600  Debby Freiberg, MD 06/20/14 [phone]

## 2014-06-23 ENCOUNTER — Inpatient Hospital Stay (HOSPITAL_COMMUNITY)
Admission: EM | Admit: 2014-06-23 | Discharge: 2014-06-26 | DRG: 190 | Disposition: A | Discharge status: Home / Self Care | Payer: Medicaid Other | Attending: Internal Medicine | Admitting: Internal Medicine

## 2014-06-23 ENCOUNTER — Emergency Department (HOSPITAL_COMMUNITY): Payer: Medicaid Other

## 2014-06-23 DIAGNOSIS — I5022 Chronic systolic (congestive) heart failure: Secondary | ICD-10-CM | POA: Diagnosis present

## 2014-06-23 DIAGNOSIS — Z681 Body mass index (BMI) 19 or less, adult: Secondary | ICD-10-CM | POA: Diagnosis not present

## 2014-06-23 DIAGNOSIS — C3491 Malignant neoplasm of unspecified part of right bronchus or lung: Secondary | ICD-10-CM | POA: Diagnosis present

## 2014-06-23 DIAGNOSIS — Z8673 Personal history of transient ischemic attack (TIA), and cerebral infarction without residual deficits: Secondary | ICD-10-CM

## 2014-06-23 DIAGNOSIS — R64 Cachexia: Secondary | ICD-10-CM | POA: Diagnosis present

## 2014-06-23 DIAGNOSIS — Z66 Do not resuscitate: Secondary | ICD-10-CM | POA: Diagnosis present

## 2014-06-23 DIAGNOSIS — Z951 Presence of aortocoronary bypass graft: Secondary | ICD-10-CM

## 2014-06-23 DIAGNOSIS — I25810 Atherosclerosis of coronary artery bypass graft(s) without angina pectoris: Secondary | ICD-10-CM

## 2014-06-23 DIAGNOSIS — E785 Hyperlipidemia, unspecified: Secondary | ICD-10-CM | POA: Diagnosis present

## 2014-06-23 DIAGNOSIS — B370 Candidal stomatitis: Secondary | ICD-10-CM | POA: Diagnosis present

## 2014-06-23 DIAGNOSIS — Z720 Tobacco use: Secondary | ICD-10-CM

## 2014-06-23 DIAGNOSIS — G629 Polyneuropathy, unspecified: Secondary | ICD-10-CM | POA: Diagnosis present

## 2014-06-23 DIAGNOSIS — J9621 Acute and chronic respiratory failure with hypoxia: Secondary | ICD-10-CM | POA: Diagnosis present

## 2014-06-23 DIAGNOSIS — E43 Unspecified severe protein-calorie malnutrition: Secondary | ICD-10-CM | POA: Diagnosis present

## 2014-06-23 DIAGNOSIS — Z9861 Coronary angioplasty status: Secondary | ICD-10-CM

## 2014-06-23 DIAGNOSIS — I255 Ischemic cardiomyopathy: Secondary | ICD-10-CM | POA: Diagnosis present

## 2014-06-23 DIAGNOSIS — Z9981 Dependence on supplemental oxygen: Secondary | ICD-10-CM | POA: Diagnosis not present

## 2014-06-23 DIAGNOSIS — Z7982 Long term (current) use of aspirin: Secondary | ICD-10-CM

## 2014-06-23 DIAGNOSIS — F411 Generalized anxiety disorder: Secondary | ICD-10-CM | POA: Diagnosis present

## 2014-06-23 DIAGNOSIS — I739 Peripheral vascular disease, unspecified: Secondary | ICD-10-CM | POA: Diagnosis present

## 2014-06-23 DIAGNOSIS — Z7951 Long term (current) use of inhaled steroids: Secondary | ICD-10-CM | POA: Diagnosis not present

## 2014-06-23 DIAGNOSIS — Z7952 Long term (current) use of systemic steroids: Secondary | ICD-10-CM | POA: Diagnosis not present

## 2014-06-23 DIAGNOSIS — Z923 Personal history of irradiation: Secondary | ICD-10-CM | POA: Diagnosis not present

## 2014-06-23 DIAGNOSIS — I10 Essential (primary) hypertension: Secondary | ICD-10-CM | POA: Diagnosis present

## 2014-06-23 DIAGNOSIS — F329 Major depressive disorder, single episode, unspecified: Secondary | ICD-10-CM | POA: Diagnosis present

## 2014-06-23 DIAGNOSIS — R0602 Shortness of breath: Secondary | ICD-10-CM | POA: Diagnosis not present

## 2014-06-23 DIAGNOSIS — J441 Chronic obstructive pulmonary disease with (acute) exacerbation: Secondary | ICD-10-CM | POA: Diagnosis present

## 2014-06-23 DIAGNOSIS — I252 Old myocardial infarction: Secondary | ICD-10-CM

## 2014-06-23 DIAGNOSIS — J45909 Unspecified asthma, uncomplicated: Secondary | ICD-10-CM | POA: Diagnosis present

## 2014-06-23 DIAGNOSIS — K219 Gastro-esophageal reflux disease without esophagitis: Secondary | ICD-10-CM | POA: Diagnosis present

## 2014-06-23 DIAGNOSIS — I2510 Atherosclerotic heart disease of native coronary artery without angina pectoris: Secondary | ICD-10-CM | POA: Diagnosis present

## 2014-06-23 DIAGNOSIS — IMO0002 Reserved for concepts with insufficient information to code with codable children: Secondary | ICD-10-CM | POA: Diagnosis present

## 2014-06-23 LAB — TROPONIN I: Troponin I: <0.03 ng/mL (ref <0.031)

## 2014-06-23 LAB — CBC
HEMATOCRIT: 39.9 % (ref 36.0–46.0)
Hemoglobin: 13 g/dL (ref 12.0–15.0)
MCH: 32 pg (ref 26.0–34.0)
MCHC: 32.6 g/dL (ref 30.0–36.0)
MCV: 98.3 fL (ref 78.0–100.0)
Platelets: 218 10*3/uL (ref 150–400)
RBC: 4.06 MIL/uL (ref 3.87–5.11)
RDW: 12.6 % (ref 11.5–15.5)
WBC: 8.8 10*3/uL (ref 4.0–10.5)

## 2014-06-23 LAB — COMPREHENSIVE METABOLIC PANEL
ALBUMIN: 3.6 g/dL (ref 3.5–5.2)
ALT: 27 U/L (ref 0–35)
ANION GAP: 8 (ref 5–15)
AST: 25 U/L (ref 0–37)
Alkaline Phosphatase: 88 U/L (ref 39–117)
BILIRUBIN TOTAL: 0.5 mg/dL (ref 0.3–1.2)
BUN: 8 mg/dL (ref 6–23)
CHLORIDE: 96 mmol/L (ref 96–112)
CO2: 32 mmol/L (ref 19–32)
Calcium: 9.1 mg/dL (ref 8.4–10.5)
Creatinine, Ser: 0.53 mg/dL (ref 0.50–1.10)
GFR calc Af Amer: >90 mL/min (ref >90)
GFR calc non Af Amer: >90 mL/min (ref >90)
Glucose, Bld: 152 mg/dL — ABNORMAL HIGH (ref 70–99)
POTASSIUM: 4.3 mmol/L (ref 3.5–5.1)
Sodium: 136 mmol/L (ref 135–145)
TOTAL PROTEIN: 6.2 g/dL (ref 6.0–8.3)

## 2014-06-23 LAB — BRAIN NATRIURETIC PEPTIDE: B NATRIURETIC PEPTIDE 5: 58.5 pg/mL (ref 0.0–100.0)

## 2014-06-23 MED ORDER — ATORVASTATIN CALCIUM 80 MG PO TABS
80.0000 mg | ORAL_TABLET | Freq: Every day | ORAL | Status: DC
  Administered 2014-06-23 – 2014-06-25 (×3): 80 mg via ORAL
  Filled 2014-06-23 (×4): qty 1

## 2014-06-23 MED ORDER — ASPIRIN EC 81 MG PO TBEC
81.0000 mg | DELAYED_RELEASE_TABLET | Freq: Every day | ORAL | Status: DC
  Administered 2014-06-24 – 2014-06-26 (×3): 81 mg via ORAL
  Filled 2014-06-23 (×4): qty 1

## 2014-06-23 MED ORDER — PRASUGREL HCL 10 MG PO TABS
10.0000 mg | ORAL_TABLET | Freq: Every day | ORAL | Status: DC
  Administered 2014-06-24 – 2014-06-26 (×3): 10 mg via ORAL
  Filled 2014-06-23 (×3): qty 1

## 2014-06-23 MED ORDER — METHYLPREDNISOLONE SODIUM SUCC 125 MG IJ SOLR
125.0000 mg | INTRAMUSCULAR | Status: AC
  Administered 2014-06-23: 125 mg via INTRAVENOUS
  Filled 2014-06-23: qty 2

## 2014-06-23 MED ORDER — VITAMIN D-3 125 MCG (5000 UT) PO TABS: 1.0000 | ORAL_TABLET | Freq: Every day | ORAL | Status: DC

## 2014-06-23 MED ORDER — IPRATROPIUM-ALBUTEROL 0.5-2.5 (3) MG/3ML IN SOLN
3.0000 mL | RESPIRATORY_TRACT | Status: DC
  Administered 2014-06-23 (×2): 3 mL via RESPIRATORY_TRACT
  Filled 2014-06-23 (×2): qty 3

## 2014-06-23 MED ORDER — SODIUM CHLORIDE 0.9 % IJ SOLN
3.0000 mL | Freq: Two times a day (BID) | INTRAMUSCULAR | Status: DC
  Administered 2014-06-23 – 2014-06-26 (×4): 3 mL via INTRAVENOUS

## 2014-06-23 MED ORDER — LEVOFLOXACIN IN D5W 750 MG/150ML IV SOLN
750.0000 mg | INTRAVENOUS | Status: AC
  Administered 2014-06-23: 750 mg via INTRAVENOUS
  Filled 2014-06-23: qty 150

## 2014-06-23 MED ORDER — ACETAMINOPHEN 325 MG PO TABS: 650.0000 mg | ORAL_TABLET | Freq: Four times a day (QID) | ORAL | Status: DC | PRN

## 2014-06-23 MED ORDER — BUDESONIDE 0.25 MG/2ML IN SUSP
0.2500 mg | Freq: Two times a day (BID) | RESPIRATORY_TRACT | Status: DC
  Administered 2014-06-23 – 2014-06-26 (×6): 0.25 mg via RESPIRATORY_TRACT
  Filled 2014-06-23 (×8): qty 2

## 2014-06-23 MED ORDER — FAMOTIDINE 20 MG PO TABS
20.0000 mg | ORAL_TABLET | Freq: Every day | ORAL | Status: DC
  Administered 2014-06-23 – 2014-06-25 (×3): 20 mg via ORAL
  Filled 2014-06-23 (×4): qty 1

## 2014-06-23 MED ORDER — IPRATROPIUM BROMIDE 0.02 % IN SOLN
0.5000 mg | Freq: Once | RESPIRATORY_TRACT | Status: AC
  Administered 2014-06-23: 0.5 mg via RESPIRATORY_TRACT
  Filled 2014-06-23: qty 2.5

## 2014-06-23 MED ORDER — HYDROCODONE-ACETAMINOPHEN 10-325 MG PO TABS
1.0000 | ORAL_TABLET | Freq: Four times a day (QID) | ORAL | Status: DC | PRN
  Administered 2014-06-23 – 2014-06-26 (×6): 1 via ORAL
  Filled 2014-06-23 (×6): qty 1

## 2014-06-23 MED ORDER — ONDANSETRON HCL 4 MG PO TABS: 4.0000 mg | ORAL_TABLET | Freq: Four times a day (QID) | ORAL | Status: DC | PRN

## 2014-06-23 MED ORDER — ALBUTEROL SULFATE (2.5 MG/3ML) 0.083% IN NEBU: 2.5000 mg | INHALATION_SOLUTION | RESPIRATORY_TRACT | Status: DC | PRN

## 2014-06-23 MED ORDER — FLUTICASONE PROPIONATE 50 MCG/ACT NA SUSP
1.0000 | Freq: Every day | NASAL | Status: DC | PRN
  Filled 2014-06-23: qty 16

## 2014-06-23 MED ORDER — VITAMIN D3 25 MCG (1000 UNIT) PO TABS
1000.0000 [IU] | ORAL_TABLET | Freq: Every day | ORAL | Status: DC
  Administered 2014-06-23 – 2014-06-26 (×4): 1000 [IU] via ORAL
  Filled 2014-06-23 (×4): qty 1

## 2014-06-23 MED ORDER — BENZONATATE 100 MG PO CAPS
200.0000 mg | ORAL_CAPSULE | Freq: Three times a day (TID) | ORAL | Status: DC | PRN
  Administered 2014-06-25: 200 mg via ORAL
  Filled 2014-06-23: qty 2

## 2014-06-23 MED ORDER — LORATADINE 10 MG PO TABS
10.0000 mg | ORAL_TABLET | Freq: Every day | ORAL | Status: DC | PRN
  Filled 2014-06-23: qty 1

## 2014-06-23 MED ORDER — ALUM & MAG HYDROXIDE-SIMETH 200-200-20 MG/5ML PO SUSP: 30.0000 mL | Freq: Four times a day (QID) | ORAL | Status: DC | PRN

## 2014-06-23 MED ORDER — ALBUTEROL SULFATE (2.5 MG/3ML) 0.083% IN NEBU
2.5000 mg | INHALATION_SOLUTION | Freq: Once | RESPIRATORY_TRACT | Status: AC
  Administered 2014-06-23: 2.5 mg via RESPIRATORY_TRACT
  Filled 2014-06-23: qty 3

## 2014-06-23 MED ORDER — ALBUTEROL SULFATE (2.5 MG/3ML) 0.083% IN NEBU
2.5000 mg | INHALATION_SOLUTION | RESPIRATORY_TRACT | Status: DC | PRN
  Administered 2014-06-24: 2.5 mg via RESPIRATORY_TRACT

## 2014-06-23 MED ORDER — GUAIFENESIN ER 600 MG PO TB12
600.0000 mg | ORAL_TABLET | Freq: Two times a day (BID) | ORAL | Status: DC
  Administered 2014-06-23 – 2014-06-26 (×6): 600 mg via ORAL
  Filled 2014-06-23 (×7): qty 1

## 2014-06-23 MED ORDER — GUAIFENESIN-DM 100-10 MG/5ML PO SYRP
5.0000 mL | ORAL_SOLUTION | ORAL | Status: DC | PRN
  Filled 2014-06-23: qty 5

## 2014-06-23 MED ORDER — ALBUTEROL SULFATE (2.5 MG/3ML) 0.083% IN NEBU
5.0000 mg | INHALATION_SOLUTION | Freq: Once | RESPIRATORY_TRACT | Status: AC
  Administered 2014-06-23: 5 mg via RESPIRATORY_TRACT
  Filled 2014-06-23: qty 6

## 2014-06-23 MED ORDER — SODIUM CHLORIDE 0.9 % IV SOLN: 250.0000 mL | INTRAVENOUS | Status: DC | PRN

## 2014-06-23 MED ORDER — FENTANYL CITRATE 0.05 MG/ML IJ SOLN
50.0000 ug | Freq: Once | INTRAMUSCULAR | Status: AC
  Administered 2014-06-23: 50 ug via INTRAVENOUS
  Filled 2014-06-23: qty 2

## 2014-06-23 MED ORDER — MORPHINE SULFATE 2 MG/ML IJ SOLN
1.0000 mg | INTRAMUSCULAR | Status: DC | PRN
  Administered 2014-06-24 – 2014-06-26 (×11): 1 mg via INTRAVENOUS
  Filled 2014-06-23 (×10): qty 1

## 2014-06-23 MED ORDER — ONDANSETRON HCL 4 MG/2ML IJ SOLN: 4.0000 mg | Freq: Four times a day (QID) | INTRAMUSCULAR | Status: DC | PRN

## 2014-06-23 MED ORDER — NITROGLYCERIN 0.4 MG SL SUBL: 0.4000 mg | SUBLINGUAL_TABLET | SUBLINGUAL | Status: DC | PRN

## 2014-06-23 MED ORDER — ACETAMINOPHEN 650 MG RE SUPP: 650.0000 mg | Freq: Four times a day (QID) | RECTAL | Status: DC | PRN

## 2014-06-23 MED ORDER — SODIUM CHLORIDE 0.9 % IJ SOLN: 3.0000 mL | INTRAMUSCULAR | Status: DC | PRN

## 2014-06-23 MED ORDER — LEVOFLOXACIN IN D5W 750 MG/150ML IV SOLN
750.0000 mg | INTRAVENOUS | Status: DC
  Administered 2014-06-24 – 2014-06-25 (×2): 750 mg via INTRAVENOUS
  Filled 2014-06-23 (×3): qty 150

## 2014-06-23 MED ORDER — DIAZEPAM 5 MG PO TABS
5.0000 mg | ORAL_TABLET | Freq: Two times a day (BID) | ORAL | Status: DC
  Administered 2014-06-23 – 2014-06-26 (×6): 5 mg via ORAL
  Filled 2014-06-23 (×6): qty 1

## 2014-06-23 MED ORDER — MORPHINE SULFATE 2 MG/ML IJ SOLN
INTRAMUSCULAR | Status: AC
  Administered 2014-06-23: 1 mg via INTRAVENOUS
  Filled 2014-06-23: qty 1

## 2014-06-23 MED ORDER — ENOXAPARIN SODIUM 30 MG/0.3ML ~~LOC~~ SOLN
30.0000 mg | SUBCUTANEOUS | Status: DC
  Administered 2014-06-23 – 2014-06-25 (×3): 30 mg via SUBCUTANEOUS
  Filled 2014-06-23 (×4): qty 0.3

## 2014-06-23 MED ORDER — SODIUM CHLORIDE 0.9 % IV BOLUS (SEPSIS)
1000.0000 mL | Freq: Once | INTRAVENOUS | Status: AC
  Administered 2014-06-23: 1000 mL via INTRAVENOUS

## 2014-06-23 MED ORDER — METHYLPREDNISOLONE SODIUM SUCC 125 MG IJ SOLR
60.0000 mg | Freq: Three times a day (TID) | INTRAMUSCULAR | Status: DC
  Administered 2014-06-24 (×3): 60 mg via INTRAVENOUS
  Filled 2014-06-23: qty 0.96
  Filled 2014-06-23: qty 2
  Filled 2014-06-23 (×2): qty 0.96
  Filled 2014-06-23: qty 2

## 2014-06-23 MED ORDER — PANTOPRAZOLE SODIUM 40 MG PO TBEC
40.0000 mg | DELAYED_RELEASE_TABLET | Freq: Every day | ORAL | Status: DC
  Administered 2014-06-24 – 2014-06-26 (×3): 40 mg via ORAL
  Filled 2014-06-23 (×3): qty 1

## 2014-06-23 MED ORDER — EZETIMIBE 10 MG PO TABS
10.0000 mg | ORAL_TABLET | Freq: Every day | ORAL | Status: DC
  Administered 2014-06-23 – 2014-06-26 (×4): 10 mg via ORAL
  Filled 2014-06-23 (×4): qty 1

## 2014-06-23 MED ORDER — MONTELUKAST SODIUM 10 MG PO TABS
10.0000 mg | ORAL_TABLET | Freq: Every day | ORAL | Status: DC
  Administered 2014-06-23 – 2014-06-25 (×3): 10 mg via ORAL
  Filled 2014-06-23 (×4): qty 1

## 2014-06-23 NOTE — H&P (Signed)
PATIENT DETAILS Name: [name] Age: 64 y.o. Sex: female Date of Birth: 1950-09-20 Admit Date: 06/23/2014 UXL:KGMW,[id number] R, MD   CHIEF COMPLAINT:  Worsening shortness of breath for the past 1 week  HPI: [name] is a 64 y.o. female with a Past Medical History of severe COPD on home O2 4 L/minute, history of chronic systolic heart failure, history of CAD who presents today with the above noted complaint. Patient was seen in the emergency room a few days ago with similar complaints, and was sent home on Zithromax and prednisone. She returns today with worsening/persistent shortness of breath that has been worsening over the past few days. She claims that for the past few days she has not been able to sleep flat, and she has persistent coughing which is mostly dry. She also claims to have subjective fever.She was then brought to the emergency room for further evaluation and treatment, and I was asked to admit this patient. Currently she denies any chest pain, nausea, vomiting or diarrhea. She denies any abdominal pain. There is no history of headache.   ALLERGIES:   Allergies  Allergen Reactions  . Amiodarone Other (See Comments)    Drops HR too low, SOB, fatigue  . Clopidogrel Bisulfate Hives  . Dilaudid [Hydromorphone Hcl] Other (See Comments)    "I couldn't function for 2 days."  . Multaq [Dronedarone] Other (See Comments)    Heart races     PAST MEDICAL HISTORY: Past Medical History  Diagnosis Date  . Acute myocardial infarction, unspecified site, episode of care unspecified 2011    VF arrest. Ruled in, cath with no clear culprit lesion, inferior WMA on LV gram, medical therapy  . Esophageal dysmotility   . Hemorrhoids   . Esophageal stricture   . Diaphragmatic hernia without mention of obstruction or gangrene   . 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 CAD (coronary artery disease)     CABG'96., Xience DES -VG-RCA (01/12/13)  .  Peripheral neuropathy   . Hyperlipidemia   . Peptic ulcer, unspecified site, unspecified as acute or chronic, without mention of hemorrhage, perforation, or obstruction   . Depressive disorder, not elsewhere classified   . Anxiety state, unspecified   . COPD (chronic obstructive pulmonary disease)   . GERD (gastroesophageal reflux disease)   . Candida esophagitis     on multiple occasions  . C. difficile diarrhea   . PVD (peripheral vascular disease)   . Headache(784.0)   . Weight loss 06/13/2011    scheduled here by family doctor  . CHF (congestive heart failure) 2011  . Asthma   . Hypertension   . History of nuclear stress test 07/14/2010    dipyriadmole; normal pattern of perfusion, low risk   . History of tobacco use     quit 05/2009  . Non-small cell carcinoma of lung, stage 3     Dr. Roxan Hockey  . History of radiation therapy 02/28/2012-04/14/2012    right lung  . Bilateral carotid artery disease   . TIA (transient ischemic attack)     amaurosis fugax  . Tubular adenoma of colon     PAST SURGICAL HISTORY: Past Surgical History  Procedure Laterality Date  . Tonsillectomy  1974  . Fracture lt jaw  1976  . Breast surgery  1969, 1970    bilateral  . Epigastric hernia repair  2008    Dr Roxan Hockey  . Coronary artery bypass graft  02/01/1994  left internal mammary to LAD, vein graft to diagonal/post descending, post lateral, marginals, circumfle, diagonal  . Carpal tunnel release    . Total abdominal hysterectomy  1998  . Laminectomy    . Subclavian vein angioplasty / stenting Left   . Transthoracic echocardiogram  08/14/2009    EF=>55%, normal LV systolic function, mild hypokinesis of basal segments of inferolateral & anterolater walls; mild MR/TR; AV mildly sclerotic  . Coronary angioplasty with stent placement  08/29/2000    stent to RCA (Dr. Corky Downs)  . Cardiac catheterization  01/17/2002    patent grafts (Dr. Marella Chimes)  . Cardiac catheterization  02/13/2004     significant L main & 3 vessel CAD, patents grafts, mild distal aortic disease (Dr. Gerrie Nordmann)  . Cardiac catheterization  07/14/2005    patent grafts, normal LV function (Dr. Adora Fridge)  . Cardiac catheterization  09/26/2008    patent grafts, normal LV function (Dr. Adora Fridge)  . Cardiac catheterization  05/31/2009    patent grafts, inferior wall motion abnormality (Dr. Adora Fridge)  . Cardiac catheterization  10/03/2009    normal LV function; 100% native RCA, circumflex, distal LAD occlusion  . Coronary angioplasty with stent placement  01/12/2013    Xience DES to SVG-RCA distal  . Carotid doppler  08/14/2009    R & L ICAs - 0-49% diameter reduction   . Left heart catheterization with coronary/graft angiogram N/A 01/12/2013    Procedure: LEFT HEART CATHETERIZATION WITH Beatrix Fetters;  Surgeon: Leonie Man, MD;  Location: [facility] CATH LAB;  Service: Cardiovascular;  Laterality: N/A;    MEDICATIONS AT HOME: Prior to Admission medications   Medication Sig Start Date End Date Taking? Authorizing Provider  albuterol (PROVENTIL HFA;VENTOLIN HFA) 108 (90 BASE) MCG/ACT inhaler Inhale 2 puffs into the lungs every 6 (six) hours as needed for wheezing or shortness of breath.   Yes Historical Provider, MD  aspirin 81 MG EC tablet Take 81 mg by mouth daily.     Yes Historical Provider, MD  atorvastatin (LIPITOR) 80 MG tablet Take 80 mg by mouth at bedtime.   Yes Historical Provider, MD  azithromycin (ZITHROMAX) 250 MG tablet Take 1 tablet (250 mg total) by mouth daily. Take first 2 tablets together, then 1 every day until finished. 06/19/14  Yes Robyn M Hess, PA-C  Cholecalciferol (VITAMIN D-3) 5000 UNITS TABS Take 1 tablet by mouth daily.   Yes Historical Provider, MD  diazepam (VALIUM) 5 MG tablet Take 5 mg by mouth 2 (two) times daily.   Yes Historical Provider, MD  EFFIENT 10 MG TABS tablet TAKE 1 TABLET BY MOUTH EVERY DAY 03/18/14  Yes Lorretta Harp, MD  famotidine (PEPCID) 20 MG tablet Take 20 mg  by mouth at bedtime.   Yes Historical Provider, MD  fluticasone (FLONASE) 50 MCG/ACT nasal spray Place 1 spray into both nostrils daily as needed for allergies or rhinitis. 03/05/14  Yes Ripudeep Krystal Eaton, MD  Fluticasone Furoate-Vilanterol (BREO ELLIPTA) 100-25 MCG/INH AEPB Inhale 1 puff into the lungs daily. 05/22/14  Yes Chesley Mires, MD  HYDROcodone-acetaminophen (NORCO) 10-325 MG per tablet Take 1 tablet by mouth every 6 (six) hours as needed for moderate pain.    Yes Historical Provider, MD  loratadine (CLARITIN) 10 MG tablet Take 1 tablet (10 mg total) by mouth daily as needed for allergies. 03/05/14  Yes Ripudeep K Rai, MD  montelukast (SINGULAIR) 10 MG tablet Take 10 mg by mouth at bedtime.   Yes Historical Provider,  MD  nitroGLYCERIN (NITROSTAT) 0.4 MG SL tablet Place 0.4 mg under the tongue every 5 (five) minutes as needed. For chest pain   Yes Historical Provider, MD  omeprazole (PRILOSEC) 20 MG capsule Take 20 mg by mouth 2 (two) times daily before a meal.   Yes Historical Provider, MD  OXYGEN-HELIUM IN Inhale 4 L into the lungs continuous.   Yes Historical Provider, MD  predniSONE (DELTASONE) 20 MG tablet Take 3 tabs PO x 2 days followed by 2 tabs PO x 2 days followed by 1 tab PO x 2 days 06/19/14  Yes Robyn M Hess, PA-C  SYMBICORT 160-4.5 MCG/ACT inhaler Inhale 2 puffs into the lungs 2 (two) times daily. 06/08/14  Yes Historical Provider, MD  tiotropium (SPIRIVA) 18 MCG inhalation capsule Place 1 capsule (18 mcg total) into inhaler and inhale daily. 05/15/14  Yes Noralee Space, MD  ZETIA 10 MG tablet TAKE 1 TABLET BY MOUTH EVERY DAY 03/25/14  Yes Lorretta Harp, MD  benzonatate (TESSALON) 100 MG capsule Take 1 capsule (100 mg total) by mouth 3 (three) times daily as needed for cough. Patient not taking: Reported on 06/23/2014 03/05/14   Ripudeep Krystal Eaton, MD  fluconazole (DIFLUCAN) 100 MG tablet Take 1 tablet (100 mg total) by mouth daily. Patient not taking: Reported on 06/23/2014 03/11/14    Tanda Rockers, MD    FAMILY HISTORY: Family History  Problem Relation Age of Onset  . Lung cancer Father     also CVA  . Heart disease Father   . Asthma Father   . Allergies Father   . Cancer Father   . Heart disease Mother     also CVA, MI  . Brain cancer Mother   . Heart disease Sister   . Colon cancer Neg Hx   . Lung cancer Brother 16    also ENT cancer  . Hypertension Brother 79    also aneurysm, early CAD  . Breast cancer Other     family hx   SOCIAL HISTORY:  reports that she quit smoking about 12 months ago. Her smoking use included Cigarettes. She has a 45 pack-year smoking history. She has never used smokeless tobacco. She reports that she does not drink alcohol or use illicit drugs.  REVIEW OF SYSTEMS:  Constitutional:   No  weight loss, night sweats,  Fevers, chills, fatigue.  HEENT:    No headaches, Difficulty swallowing,Tooth/dental problems,Sore throat   Cardio-vascular: No chest pain,  Orthopnea, PND, swelling in lower extremities, anasarca, dizziness, palpitations  GI:  No heartburn, indigestion, abdominal pain, nausea, vomiting, diarrhea  Resp: No hemoptysis  Skin:  no rash or lesions.  GU:  no dysuria, change in color of urine, no urgency or frequency.  No flank pain.  Musculoskeletal: No joint pain or swelling.  No decreased range of motion.  No back pain.  Psych: No change in mood or affect. No depression or anxiety.  No memory loss.   PHYSICAL EXAM: Blood pressure 105/62, pulse 92, temperature 98 F (36.7 C), temperature source Oral, resp. rate 21, SpO2 99 %.  General appearance :Awake, alert, not in any distress. Speech Clear. Chronically sick Looking HEENT: Atraumatic and Normocephalic, pupils equally reactive to light and accomodation Neck: supple, no JVD. No cervical lymphadenopathy.  Chest:Good air entry bilaterally, except in bilateral bases, coarse rhonchi all over CVS: S1 S2 regular, no murmurs.  Abdomen: Bowel sounds  present, Non tender and not distended with no gaurding, rigidity or rebound. Extremities: B/L  Lower Ext shows no edema, both legs are warm to touch Neurology:, Non focal but with generalized weakness Skin:No Rash Wounds:N/A  LABS ON ADMISSION:   Recent Labs  06/23/14 1304  NA 136  K 4.3  CL 96  CO2 32  GLUCOSE 152*  BUN 8  CREATININE 0.53  CALCIUM 9.1    Recent Labs  06/23/14 1304  AST 25  ALT 27  ALKPHOS 88  BILITOT 0.5  PROT 6.2  ALBUMIN 3.6   No results for input(s): LIPASE, AMYLASE in the last 72 hours.  Recent Labs  06/23/14 1304  WBC 8.8  HGB 13.0  HCT 39.9  MCV 98.3  PLT 218    Recent Labs  06/23/14 1304  TROPONINI <0.03   No results for input(s): DDIMER in the last 72 hours. Invalid input(s): POCBNP   RADIOLOGIC STUDIES ON ADMISSION: Dg Chest 2 View  06/23/2014   CLINICAL DATA:  Left-sided chest pain with radicular symptoms and the left arm. Three-week history of difficulty breathing. History of prior lung carcinoma.  EXAM: CHEST  2 VIEW  COMPARISON:  Chest radiograph and chest CT June 19, 2014  FINDINGS: There is underlying emphysematous change. A spiculated nodular lesion in the medial right upper lobe seen on CT is not appreciable on radiographic examination. There is scarring in the right upper lobe superiorly and medially. There is no edema or consolidation. Heart size is normal. Pulmonary vascularity reflects underlying emphysema. No adenopathy. Patient is status post coronary artery bypass grafting. There is postoperative change in the lower thoracic and upper lumbar spine regions. No blastic or lytic bone lesions are identified.  IMPRESSION: Underlying emphysema. No edema or consolidation. Spiculated nodular lesion seen on CT is not appreciable on radiographic examination. There is scarring in this region on the chest radiograph, however.   Electronically Signed   By: Lowella Grip M.D.   On: 06/23/2014 14:27     EKG: Independently  reviewed. NSR with PACs  ASSESSMENT AND PLAN: Present on Admission:  . COPD exacerbation: Will admit to MedSurg, start IV steroids, empiric Levaquin, scheduled and as needed bronchodilators. Follow clinical course.   . Acute and chronic respiratory failure with hypoxia: Continue with oxygen, slowly taper back to her usual home regimen of 4 L. This is likely secondary to above, but will check a BNP given history of mild chronic systolic heart failure   . 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 Patient follows with Dr. Inda Merlin.  . Protein-calorie malnutrition, severe: Significantly cachectic, likely secondary to severe COPD, underlying malignancy and numerous card issues. Nutrition consult.   . Hyperlipidemia: Continue with statin   . Cardiomyopathy, ischemic - mild to moderate. EF ~45%: Clinically compensated, check BNP. If shortness of breath does not improve with the above-noted measures, may need addition of small amount of diuretics.  . Coronary atherosclerosis: Is status post CABG and PCI, continue with both aspirin and Effient. Currently without any chest pain, shortness of breath is likely secondary to COPD exacerbation.  . Known bilateral carotid artery disease: Reviewed prior documentation, not felt to be a candidate for CEA given extensive cardiac and pulmonary issues. Manage medically.  Further plan will depend as patient's clinical course evolves and further radiologic and laboratory data become available. Patient will be monitored closely.  Above noted plan was discussed with patient/daughter at bedside, they were in agreement.   DVT Prophylaxis: Prophylactic Lovenox  Code Status: DNR-reconfirmed with both patient and daughter at bedside-in fact daughter with out of facility golden yellow DO NOT RESUSCITATE form.  Disposition Plan: Home when stable   Total time spent for  admission equals 45 minutes.  [facility] Pager [phone]  If 7PM-7AM, please contact night-coverage [email or web address] Password [facility] 06/23/2014, 3:19 PM

## 2014-06-23 NOTE — ED Notes (Signed)
Female Visitor at bedside questioning respiratory therapy about what pain medication was given to pt, "Did you give her that dilaudid."  Daughter, who has been at the bedside since admission, was already informed that pt received fentanyl for pain. Family also wanted to know if pt could drink, reports, "Even if she can't drink then we will go get her something anyway."

## 2014-06-23 NOTE — ED Notes (Signed)
Patient transported to X-ray 

## 2014-06-23 NOTE — ED Notes (Signed)
Received pt from home with c/o seen here on the 27th with c/o COPD exacerbation. Today symptoms increased. Pt given albuterol neb treatment. Pt on 4 L O2 at home. Temp 101.8 for EMS, pt given 1,000 mg of tylenol by EMS. Pt with cough.

## 2014-06-23 NOTE — ED Notes (Signed)
Admitting MD at bedside.

## 2014-06-23 NOTE — Progress Notes (Signed)
ANTIBIOTIC CONSULT NOTE - INITIAL  Pharmacy Consult for Levofloxacin Indication: COPD exacerbation  Allergies  Allergen Reactions  . Amiodarone Other (See Comments)    Drops HR too low, SOB, fatigue  . Clopidogrel Bisulfate Hives  . Dilaudid [Hydromorphone Hcl] Other (See Comments)    "I couldn't function for 2 days."  . Multaq [Dronedarone] Other (See Comments)    Heart races     Patient Measurements:   Adjusted Body Weight:   Vital Signs: Temp: 98.4 F (36.9 C) (01/31 1636) Temp Source: Oral (01/31 1636) BP: 103/66 mmHg (01/31 1636) Pulse Rate: 85 (01/31 1636) Intake/Output from previous day:   Intake/Output from this shift:    Labs:  Recent Labs  06/23/14 1304  WBC 8.8  HGB 13.0  PLT 218  CREATININE 0.53   CrCl cannot be calculated (Unknown ideal weight.). No results for input(s): VANCOTROUGH, VANCOPEAK, VANCORANDOM, GENTTROUGH, GENTPEAK, GENTRANDOM, TOBRATROUGH, TOBRAPEAK, TOBRARND, AMIKACINPEAK, AMIKACINTROU, AMIKACIN in the last 72 hours.   Microbiology: No results found for this or any previous visit (from the past 720 hour(s)).  Medical History: Past Medical History  Diagnosis Date  . Acute myocardial infarction, unspecified site, episode of care unspecified 2011    VF arrest. Ruled in, cath with no clear culprit lesion, inferior WMA on LV gram, medical therapy  . Esophageal dysmotility   . Hemorrhoids   . Esophageal stricture   . Diaphragmatic hernia without mention of obstruction or gangrene   . 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 CAD (coronary artery disease)     CABG'96., Xience DES -VG-RCA (01/12/13)  . Peripheral neuropathy   . Hyperlipidemia   . Peptic ulcer, unspecified site, unspecified as acute or chronic, without mention of hemorrhage, perforation, or obstruction   . Depressive disorder, not elsewhere classified   . Anxiety state, unspecified   . COPD (chronic obstructive pulmonary disease)   . GERD (gastroesophageal  reflux disease)   . Candida esophagitis     on multiple occasions  . C. difficile diarrhea   . PVD (peripheral vascular disease)   . Headache(784.0)   . Weight loss 06/13/2011    scheduled here by family doctor  . CHF (congestive heart failure) 2011  . Asthma   . Hypertension   . History of nuclear stress test 07/14/2010    dipyriadmole; normal pattern of perfusion, low risk   . History of tobacco use     quit 05/2009  . Non-small cell carcinoma of lung, stage 3     Dr. Roxan Hockey  . History of radiation therapy 02/28/2012-04/14/2012    right lung  . Bilateral carotid artery disease   . TIA (transient ischemic attack)     amaurosis fugax  . Tubular adenoma of colon     Medications:  Scheduled:  . aspirin  81 mg Oral Daily  . atorvastatin  80 mg Oral QHS  . budesonide (PULMICORT) nebulizer solution  0.25 mg Nebulization BID  . diazepam  5 mg Oral BID  . enoxaparin (LOVENOX) injection  40 mg Subcutaneous Q24H  . ezetimibe  10 mg Oral Daily  . famotidine  20 mg Oral QHS  . guaiFENesin  600 mg Oral BID  . ipratropium-albuterol  3 mL Nebulization Q4H  . methylPREDNISolone (SOLU-MEDROL) injection  60 mg Intravenous 3 times per day  . montelukast  10 mg Oral QHS  . pantoprazole  40 mg Oral Daily  . prasugrel  10 mg Oral Daily  . sodium chloride  3 mL Intravenous Q12H  .  Vitamin D-3  1 tablet Oral Daily   Assessment: 64yo female with severe COPD on home O2 4L/min and worsening SOB after several days of Azithromycin and prednisone, to start Levofloxacin.  WBC is wnl, Cr < 1.  She has not received any Levofloxacin today.  Goal of Therapy:  resolution of infection  Plan:  Levofloxacin 750mg  IV q24 Watch renal fxn Transition to PO when appropriate  [name] P 06/23/2014,4:37 PM

## 2014-06-23 NOTE — ED Provider Notes (Signed)
CSN: [id number]     Arrival date & time 06/23/14  1228 History   First MD Initiated Contact with Patient 06/23/14 1232     Chief Complaint  Patient presents with  . Shortness of Breath     (Consider location/radiation/quality/duration/timing/severity/associated sxs/prior Treatment) HPI Patient presents with concern of dyspnea, fatigue, fever. Patient was seen 4 days ago, diagnosed with COPD exacerbation. Discharged with steroids, antibiotics.  Symptoms have progressed since that time, with worsening fatigue in particular. No relief with Tylenol, multiple nebulizer treatments at home. No confusion, disorientation.  Past Medical History  Diagnosis Date  . Acute myocardial infarction, unspecified site, episode of care unspecified 2011    VF arrest. Ruled in, cath with no clear culprit lesion, inferior WMA on LV gram, medical therapy  . Esophageal dysmotility   . Hemorrhoids   . Esophageal stricture   . Diaphragmatic hernia without mention of obstruction or gangrene   . 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 CAD (coronary artery disease)     CABG'96., Xience DES -VG-RCA (01/12/13)  . Peripheral neuropathy   . Hyperlipidemia   . Peptic ulcer, unspecified site, unspecified as acute or chronic, without mention of hemorrhage, perforation, or obstruction   . Depressive disorder, not elsewhere classified   . Anxiety state, unspecified   . COPD (chronic obstructive pulmonary disease)   . GERD (gastroesophageal reflux disease)   . Candida esophagitis     on multiple occasions  . C. difficile diarrhea   . PVD (peripheral vascular disease)   . Headache(784.0)   . Weight loss 06/13/2011    scheduled here by family doctor  . CHF (congestive heart failure) 2011  . Asthma   . Hypertension   . History of nuclear stress test 07/14/2010    dipyriadmole; normal pattern of perfusion, low risk   . History of tobacco use     quit 05/2009  . Non-small cell carcinoma of lung, stage 3      Dr. Roxan Hockey  . History of radiation therapy 02/28/2012-04/14/2012    right lung  . Bilateral carotid artery disease   . TIA (transient ischemic attack)     amaurosis fugax  . Tubular adenoma of colon    Past Surgical History  Procedure Laterality Date  . Tonsillectomy  1974  . Fracture lt jaw  1976  . Breast surgery  1969, 1970    bilateral  . Epigastric hernia repair  2008    Dr Roxan Hockey  . Coronary artery bypass graft  02/01/1994    left internal mammary to LAD, vein graft to diagonal/post descending, post lateral, marginals, circumfle, diagonal  . Carpal tunnel release    . Total abdominal hysterectomy  1998  . Laminectomy    . Subclavian vein angioplasty / stenting Left   . Transthoracic echocardiogram  08/14/2009    EF=>55%, normal LV systolic function, mild hypokinesis of basal segments of inferolateral & anterolater walls; mild MR/TR; AV mildly sclerotic  . Coronary angioplasty with stent placement  08/29/2000    stent to RCA (Dr. Corky Downs)  . Cardiac catheterization  01/17/2002    patent grafts (Dr. Marella Chimes)  . Cardiac catheterization  02/13/2004    significant L main & 3 vessel CAD, patents grafts, mild distal aortic disease (Dr. Gerrie Nordmann)  . Cardiac catheterization  07/14/2005    patent grafts, normal LV function (Dr. Adora Fridge)  . Cardiac catheterization  09/26/2008    patent grafts, normal LV function (Dr. Adora Fridge)  .  Cardiac catheterization  05/31/2009    patent grafts, inferior wall motion abnormality (Dr. Adora Fridge)  . Cardiac catheterization  10/03/2009    normal LV function; 100% native RCA, circumflex, distal LAD occlusion  . Coronary angioplasty with stent placement  01/12/2013    Xience DES to SVG-RCA distal  . Carotid doppler  08/14/2009    R & L ICAs - 0-49% diameter reduction   . Left heart catheterization with coronary/graft angiogram N/A 01/12/2013    Procedure: LEFT HEART CATHETERIZATION WITH Beatrix Fetters;  Surgeon: Leonie Man, MD;   Location: [facility] CATH LAB;  Service: Cardiovascular;  Laterality: N/A;   Family History  Problem Relation Age of Onset  . Lung cancer Father     also CVA  . Heart disease Father   . Asthma Father   . Allergies Father   . Cancer Father   . Heart disease Mother     also CVA, MI  . Brain cancer Mother   . Heart disease Sister   . Colon cancer Neg Hx   . Lung cancer Brother 40    also ENT cancer  . Hypertension Brother 64    also aneurysm, early CAD  . Breast cancer Other     family hx   History  Substance Use Topics  . Smoking status: Former Smoker -- 1.50 packs/day for 30 years    Types: Cigarettes    Quit date: 05/24/2013  . Smokeless tobacco: Never Used  . Alcohol Use: No   OB History    No data available     Review of Systems  Constitutional:       Per HPI, otherwise negative  HENT:       Per HPI, otherwise negative  Respiratory:       Per HPI, otherwise negative  Cardiovascular:       Per HPI, otherwise negative  Gastrointestinal: Negative for vomiting.  Endocrine:       Negative aside from HPI  Genitourinary:       Neg aside from HPI   Musculoskeletal:       Per HPI, otherwise negative  Skin: Negative.   Neurological: Positive for weakness. Negative for syncope.      Allergies  Amiodarone; Clopidogrel bisulfate; Dilaudid; and Multaq  Home Medications   Prior to Admission medications   Medication Sig Start Date End Date Taking? Authorizing Provider  albuterol (PROVENTIL HFA;VENTOLIN HFA) 108 (90 BASE) MCG/ACT inhaler Inhale 2 puffs into the lungs every 6 (six) hours as needed for wheezing or shortness of breath.    Historical Provider, MD  aspirin 81 MG EC tablet Take 81 mg by mouth daily.      Historical Provider, MD  atorvastatin (LIPITOR) 80 MG tablet Take 80 mg by mouth at bedtime.    Historical Provider, MD  azithromycin (ZITHROMAX) 250 MG tablet Take 1 tablet (250 mg total) by mouth daily. Take first 2 tablets together, then 1 every day until  finished. 06/19/14   Robyn M Hess, PA-C  benzonatate (TESSALON) 100 MG capsule Take 1 capsule (100 mg total) by mouth 3 (three) times daily as needed for cough. 03/05/14   Ripudeep Krystal Eaton, MD  Cholecalciferol (VITAMIN D-3) 5000 UNITS TABS Take 1 tablet by mouth daily.    Historical Provider, MD  diazepam (VALIUM) 5 MG tablet Take 5 mg by mouth 2 (two) times daily.    Historical Provider, MD  EFFIENT 10 MG TABS tablet TAKE 1 TABLET BY MOUTH EVERY DAY 03/18/14  Lorretta Harp, MD  famotidine (PEPCID) 20 MG tablet Take 20 mg by mouth at bedtime.    Historical Provider, MD  fluconazole (DIFLUCAN) 100 MG tablet Take 1 tablet (100 mg total) by mouth daily. 03/11/14   Tanda Rockers, MD  fluticasone (FLONASE) 50 MCG/ACT nasal spray Place 1 spray into both nostrils daily as needed for allergies or rhinitis. 03/05/14   Ripudeep Krystal Eaton, MD  Fluticasone Furoate-Vilanterol (BREO ELLIPTA) 100-25 MCG/INH AEPB Inhale 1 puff into the lungs daily. 05/22/14   Chesley Mires, MD  HYDROcodone-acetaminophen (NORCO) 10-325 MG per tablet Take 1 tablet by mouth every 6 (six) hours as needed for moderate pain.     Historical Provider, MD  loratadine (CLARITIN) 10 MG tablet Take 1 tablet (10 mg total) by mouth daily as needed for allergies. 03/05/14   Ripudeep Krystal Eaton, MD  montelukast (SINGULAIR) 10 MG tablet Take 10 mg by mouth at bedtime.    Historical Provider, MD  nitroGLYCERIN (NITROSTAT) 0.4 MG SL tablet Place 0.4 mg under the tongue every 5 (five) minutes as needed. For chest pain    Historical Provider, MD  omeprazole (PRILOSEC) 20 MG capsule Take 20 mg by mouth 2 (two) times daily before a meal.    Historical Provider, MD  OXYGEN-HELIUM IN Inhale 4 L into the lungs continuous.    Historical Provider, MD  predniSONE (DELTASONE) 20 MG tablet Take 3 tabs PO x 2 days followed by 2 tabs PO x 2 days followed by 1 tab PO x 2 days 06/19/14   Carman Ching, PA-C  tiotropium (SPIRIVA) 18 MCG inhalation capsule Place 1 capsule (18 mcg  total) into inhaler and inhale daily. 05/15/14   Noralee Space, MD  ZETIA 10 MG tablet TAKE 1 TABLET BY MOUTH EVERY DAY 03/25/14   Lorretta Harp, MD   BP 129/68 mmHg  Pulse 91  Temp(Src) 98 F (36.7 C) (Oral)  Resp 16  SpO2 99% Physical Exam  Constitutional: She is oriented to person, place, and time. She appears well-developed. She appears cachectic.  Chronically ill-appearing.  HENT:  Head: Normocephalic and atraumatic.  Mouth/Throat: Oropharynx is clear and moist.  Eyes: Conjunctivae and EOM are normal. No scleral icterus.  Neck: Normal range of motion. Neck supple. No JVD present.  Cardiovascular: Regular rhythm and normal heart sounds.   Mild tachycardia.  Pulmonary/Chest: Effort normal.  Poor air movement. Scattered wheezes bilateral.  Musculoskeletal: Normal range of motion. She exhibits no edema.  Neurological: She is alert and oriented to person, place, and time. No sensory deficit.  Skin: Skin is warm and dry.  Psychiatric: She has a normal mood and affect. Her behavior is normal.  Nursing note and vitals reviewed.   ED Course  Procedures (including critical care time) Labs Review Labs Reviewed  COMPREHENSIVE METABOLIC PANEL - Abnormal; Notable for the following:    Glucose, Bld 152 (*)    All other components within normal limits  CBC  TROPONIN I    Imaging Review Dg Chest 2 View  06/23/2014   CLINICAL DATA:  Left-sided chest pain with radicular symptoms and the left arm. Three-week history of difficulty breathing. History of prior lung carcinoma.  EXAM: CHEST  2 VIEW  COMPARISON:  Chest radiograph and chest CT June 19, 2014  FINDINGS: There is underlying emphysematous change. A spiculated nodular lesion in the medial right upper lobe seen on CT is not appreciable on radiographic examination. There is scarring in the right upper lobe superiorly and  medially. There is no edema or consolidation. Heart size is normal. Pulmonary vascularity reflects underlying  emphysema. No adenopathy. Patient is status post coronary artery bypass grafting. There is postoperative change in the lower thoracic and upper lumbar spine regions. No blastic or lytic bone lesions are identified.  IMPRESSION: Underlying emphysema. No edema or consolidation. Spiculated nodular lesion seen on CT is not appreciable on radiographic examination. There is scarring in this region on the chest radiograph, however.   Electronically Signed   By: Lowella Grip M.D.   On: 06/23/2014 14:27     EKG Interpretation   Date/Time:  Sunday June 23 2014 12:48:12 EST Ventricular Rate:  96 PR Interval:  114 QRS Duration: 80 QT Interval:  336 QTC Calculation: 425 R Axis:   -40 Text Interpretation:  Sinus rhythm Multiple premature complexes, vent   Borderline short PR interval Right atrial enlargement Left axis deviation  Borderline low voltage, extremity leads Nonspecific T abnormalities,  lateral leads Sinus rhythm Premature atrial complexes T wave abnormality  Artifact T wave abnormality Abnormal ekg Confirmed by Carmin Muskrat  MD  ([id number]) on 06/23/2014 12:56:27 PM     Pulse oximetry 99% with supplemental oxygen, abnormal After my initial evaluation the patient had respiratory therapy evaluation, received bronchodilator therapy.   3:04 PM Patient remains dyspneic. Respiratory Therapy notes that the patient cannot accomplish the Peak Flow measurement.  MDM   Final diagnoses:  COPD exacerbation   this generally well-appearing female presents with ongoing dyspnea.  Patient has COPD, requiring 4 L nasal cannula 24/7. Here the patient received multiple nebulizer treatments, has minimal improvement in her condition, but remains awake and alert, appropriately interactive throughout. Patient received steroids in addition, fluid resuscitation in addition. Patient required admission for further evaluation and management.  Carmin Muskrat, MD 06/23/14 [phone]

## 2014-06-24 DIAGNOSIS — J441 Chronic obstructive pulmonary disease with (acute) exacerbation: Secondary | ICD-10-CM

## 2014-06-24 LAB — URINALYSIS, ROUTINE W REFLEX MICROSCOPIC
BILIRUBIN URINE: NEGATIVE
GLUCOSE, UA: 100 mg/dL — AB
Hgb urine dipstick: NEGATIVE
Ketones, ur: NEGATIVE mg/dL
Leukocytes, UA: NEGATIVE
Nitrite: NEGATIVE
Protein, ur: NEGATIVE mg/dL
SPECIFIC GRAVITY, URINE: 1.009 (ref 1.005–1.030)
Urobilinogen, UA: 0.2 mg/dL (ref 0.0–1.0)
pH: 7 (ref 5.0–8.0)

## 2014-06-24 LAB — CBC
HEMATOCRIT: 35.6 % — AB (ref 36.0–46.0)
Hemoglobin: 11.6 g/dL — ABNORMAL LOW (ref 12.0–15.0)
MCH: 31.3 pg (ref 26.0–34.0)
MCHC: 32.6 g/dL (ref 30.0–36.0)
MCV: 96 fL (ref 78.0–100.0)
Platelets: 214 10*3/uL (ref 150–400)
RBC: 3.71 MIL/uL — ABNORMAL LOW (ref 3.87–5.11)
RDW: 12.6 % (ref 11.5–15.5)
WBC: 8.7 10*3/uL (ref 4.0–10.5)

## 2014-06-24 LAB — BASIC METABOLIC PANEL
Anion gap: 5 (ref 5–15)
BUN: 9 mg/dL (ref 6–23)
CO2: 35 mmol/L — ABNORMAL HIGH (ref 19–32)
Calcium: 9.2 mg/dL (ref 8.4–10.5)
Chloride: 101 mmol/L (ref 96–112)
Creatinine, Ser: 0.52 mg/dL (ref 0.50–1.10)
GFR calc Af Amer: >90 mL/min (ref >90)
GFR calc non Af Amer: >90 mL/min (ref >90)
Glucose, Bld: 141 mg/dL — ABNORMAL HIGH (ref 70–99)
POTASSIUM: 4.2 mmol/L (ref 3.5–5.1)
Sodium: 141 mmol/L (ref 135–145)

## 2014-06-24 MED ORDER — NICOTINE 21 MG/24HR TD PT24
21.0000 mg | MEDICATED_PATCH | Freq: Every day | TRANSDERMAL | Status: DC
  Administered 2014-06-24 – 2014-06-26 (×3): 21 mg via TRANSDERMAL
  Filled 2014-06-24 (×3): qty 1

## 2014-06-24 MED ORDER — SALINE SPRAY 0.65 % NA SOLN
1.0000 | NASAL | Status: DC | PRN
  Administered 2014-06-24: 1 via NASAL
  Filled 2014-06-24: qty 44

## 2014-06-24 MED ORDER — IPRATROPIUM-ALBUTEROL 0.5-2.5 (3) MG/3ML IN SOLN
3.0000 mL | Freq: Three times a day (TID) | RESPIRATORY_TRACT | Status: DC
  Administered 2014-06-24 (×2): 3 mL via RESPIRATORY_TRACT
  Filled 2014-06-24 (×3): qty 3

## 2014-06-24 MED ORDER — IPRATROPIUM-ALBUTEROL 0.5-2.5 (3) MG/3ML IN SOLN
3.0000 mL | Freq: Four times a day (QID) | RESPIRATORY_TRACT | Status: DC
  Administered 2014-06-24 – 2014-06-26 (×6): 3 mL via RESPIRATORY_TRACT
  Filled 2014-06-24 (×5): qty 3
  Filled 2014-06-24: qty 39

## 2014-06-24 MED ORDER — ENSURE COMPLETE PO LIQD
237.0000 mL | Freq: Four times a day (QID) | ORAL | Status: DC
  Administered 2014-06-24 – 2014-06-26 (×8): 237 mL via ORAL

## 2014-06-24 MED ORDER — FLUCONAZOLE 100 MG PO TABS
100.0000 mg | ORAL_TABLET | Freq: Once | ORAL | Status: AC
  Administered 2014-06-25: 100 mg via ORAL
  Filled 2014-06-24: qty 1

## 2014-06-24 MED ORDER — MAGIC MOUTHWASH
5.0000 mL | Freq: Four times a day (QID) | ORAL | Status: DC
  Administered 2014-06-24 – 2014-06-26 (×6): 5 mL via ORAL
  Filled 2014-06-24 (×9): qty 5

## 2014-06-24 MED ORDER — METHYLPREDNISOLONE SODIUM SUCC 125 MG IJ SOLR
60.0000 mg | Freq: Four times a day (QID) | INTRAMUSCULAR | Status: DC
  Administered 2014-06-24 – 2014-06-25 (×3): 60 mg via INTRAVENOUS
  Filled 2014-06-24 (×4): qty 0.96

## 2014-06-24 NOTE — Progress Notes (Signed)
INITIAL NUTRITION ASSESSMENT  Pt meets criteria for SEVERE MALNUTRITION in the context of chronic illness as evidenced by severe fat and muscle mass loss.  DOCUMENTATION CODES Per approved criteria  -Severe malnutrition in the context of chronic illness -Underweight   INTERVENTION: Provide Ensure Complete po QID, each supplement provides 350 kcal and 13 grams of protein.  Recommend obtaining new weight to fully assess weight trends.  Encourage PO intake.   NUTRITION DIAGNOSIS: Inadequate oral intake related to decreased appetite as evidenced by meal completion of <25%.   Goal: Pt to meet >/= 90% of their estimated nutrition needs   Monitor:  PO intake, weight trends, labs, I/O's  Reason for Assessment: MD consult for assessment of nutrition requirements/status  64 y.o. female  Admitting Dx: COPD exacerbation  ASSESSMENT: Past Medical History of severe COPD on home O2 4 L/minute, history of chronic systolic heart failure, history of CAD who presents today with worsening SOB.  Pt reports having a lack of appetite which has been ongoing over the past 1 year. Pt reports she has been only eating on average 1 and a half meals a day along with drinking Ensure 4 times daily. Current meal completion is <25%. Pt reports her usual body weight is 135 lbs, which she last weighed 5 years ago. RD to order Ensure QID for pt. Pt was educated to continue with her Ensure at home due to inadequate oral intake. Pt was encouraged to eat her food at meal and to drink her supplements.  Nutrition Focused Physical Exam:  Subcutaneous Fat:  Orbital Region: N/A Upper Arm Region: Severe depletion Thoracic and Lumbar Region: Severe depletion  Muscle:  Temple Region: N/A Clavicle Bone Region: Severe depletion Clavicle and Acromion Bone Region: Severe depletion Scapular Bone Region: N/A Dorsal Hand: N/A Patellar Region: Severe depletion Anterior Thigh Region: Severe depletion Posterior Calf  Region: Severe depletion  Edema: none  Labs and medications reviewed.  Height: Ht Readings from Last 1 Encounters:  05/15/14 5\' 7"  (1.702 m)    Weight: Wt Readings from Last 1 Encounters:  05/15/14 95 lb 8 oz (43.319 kg)    Ideal Body Weight: 135 lbs  % Ideal Body Weight: 70%  Wt Readings from Last 10 Encounters:  05/15/14 95 lb 8 oz (43.319 kg)  03/15/14 97 lb 8 oz (44.226 kg)  03/03/14 105 lb 11.2 oz (47.945 kg)  01/23/14 96 lb 14.4 oz (43.954 kg)  01/01/14 94 lb 12.8 oz (43.001 kg)  12/31/13 95 lb 9.6 oz (43.364 kg)  10/17/13 94 lb 3.2 oz (42.729 kg)  08/07/13 94 lb (42.638 kg)  07/22/13 92 lb 6.4 oz (41.912 kg)  06/07/13 98 lb 3.2 oz (44.543 kg)    Usual Body Weight: 135 lbs (pt reports 5 years ago)  % Usual Body Weight: 70%  BMI:  Body Mass Index: 14.87 kg/(m^2) Underweight  Estimated Nutritional Needs: Kcal: 1600-1800 Protein: 70-80 grams Fluid: 1.6 0 1.8 L/day  Skin: intact  Diet Order: Diet Heart  EDUCATION NEEDS: -Education needs addressed   Intake/Output Summary (Last 24 hours) at 06/24/14 1022 Last data filed at 06/24/14 0700  Gross per 24 hour  Intake    960 ml  Output      0 ml  Net    960 ml    Last BM: 1/29  Labs:   Recent Labs Lab 06/19/14 1240 06/23/14 1304 06/24/14 0642  NA 137 136 141  K 4.1 4.3 4.2  CL 97 96 101  CO2 30 32 35*  BUN 8 8 9   CREATININE 0.54 0.53 0.52  CALCIUM 9.7 9.1 9.2  GLUCOSE 116* 152* 141*    CBG (last 3)  No results for input(s): GLUCAP in the last 72 hours.  Scheduled Meds: . aspirin EC  81 mg Oral Daily  . atorvastatin  80 mg Oral QHS  . budesonide (PULMICORT) nebulizer solution  0.25 mg Nebulization BID  . cholecalciferol  1,000 Units Oral Daily  . diazepam  5 mg Oral BID  . enoxaparin (LOVENOX) injection  30 mg Subcutaneous Q24H  . ezetimibe  10 mg Oral Daily  . famotidine  20 mg Oral QHS  . guaiFENesin  600 mg Oral BID  . ipratropium-albuterol  3 mL Nebulization TID  .  levofloxacin (LEVAQUIN) IV  750 mg Intravenous Q24H  . methylPREDNISolone (SOLU-MEDROL) injection  60 mg Intravenous 3 times per day  . montelukast  10 mg Oral QHS  . pantoprazole  40 mg Oral Daily  . prasugrel  10 mg Oral Daily  . sodium chloride  3 mL Intravenous Q12H    Continuous Infusions:   Past Medical History  Diagnosis Date  . Acute myocardial infarction, unspecified site, episode of care unspecified 2011    VF arrest. Ruled in, cath with no clear culprit lesion, inferior WMA on LV gram, medical therapy  . Esophageal dysmotility   . Hemorrhoids   . Esophageal stricture   . Diaphragmatic hernia without mention of obstruction or gangrene   . 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 CAD (coronary artery disease)     CABG'96., Xience DES -VG-RCA (01/12/13)  . Peripheral neuropathy   . Hyperlipidemia   . Peptic ulcer, unspecified site, unspecified as acute or chronic, without mention of hemorrhage, perforation, or obstruction   . Depressive disorder, not elsewhere classified   . Anxiety state, unspecified   . COPD (chronic obstructive pulmonary disease)   . GERD (gastroesophageal reflux disease)   . Candida esophagitis     on multiple occasions  . C. difficile diarrhea   . PVD (peripheral vascular disease)   . Headache(784.0)   . Weight loss 06/13/2011    scheduled here by family doctor  . CHF (congestive heart failure) 2011  . Asthma   . Hypertension   . History of nuclear stress test 07/14/2010    dipyriadmole; normal pattern of perfusion, low risk   . History of tobacco use     quit 05/2009  . Non-small cell carcinoma of lung, stage 3     Dr. Roxan Hockey  . History of radiation therapy 02/28/2012-04/14/2012    right lung  . Bilateral carotid artery disease   . TIA (transient ischemic attack)     amaurosis fugax  . Tubular adenoma of colon     Past Surgical History  Procedure Laterality Date  . Tonsillectomy  1974  . Fracture lt jaw  1976  . Breast  surgery  1969, 1970    bilateral  . Epigastric hernia repair  2008    Dr Roxan Hockey  . Coronary artery bypass graft  02/01/1994    left internal mammary to LAD, vein graft to diagonal/post descending, post lateral, marginals, circumfle, diagonal  . Carpal tunnel release    . Total abdominal hysterectomy  1998  . Laminectomy    . Subclavian vein angioplasty / stenting Left   . Transthoracic echocardiogram  08/14/2009    EF=>55%, normal LV systolic function, mild hypokinesis of basal segments of inferolateral & anterolater walls; mild MR/TR; AV mildly  sclerotic  . Coronary angioplasty with stent placement  08/29/2000    stent to RCA (Dr. Corky Downs)  . Cardiac catheterization  01/17/2002    patent grafts (Dr. Marella Chimes)  . Cardiac catheterization  02/13/2004    significant L main & 3 vessel CAD, patents grafts, mild distal aortic disease (Dr. Gerrie Nordmann)  . Cardiac catheterization  07/14/2005    patent grafts, normal LV function (Dr. Adora Fridge)  . Cardiac catheterization  09/26/2008    patent grafts, normal LV function (Dr. Adora Fridge)  . Cardiac catheterization  05/31/2009    patent grafts, inferior wall motion abnormality (Dr. Adora Fridge)  . Cardiac catheterization  10/03/2009    normal LV function; 100% native RCA, circumflex, distal LAD occlusion  . Coronary angioplasty with stent placement  01/12/2013    Xience DES to SVG-RCA distal  . Carotid doppler  08/14/2009    R & L ICAs - 0-49% diameter reduction   . Left heart catheterization with coronary/graft angiogram N/A 01/12/2013    Procedure: LEFT HEART CATHETERIZATION WITH Beatrix Fetters;  Surgeon: Leonie Man, MD;  Location: [facility] CATH LAB;  Service: Cardiovascular;  Laterality: N/A;    Kallie Locks, MS, RD, LDN Pager # [phone] After hours/ weekend pager # [phone]

## 2014-06-24 NOTE — Progress Notes (Signed)
[facility] PROGRESS NOTE  [name] PJA:[id number] DOB: 1950-05-27 DOA: 06/23/2014 PCP: Tivis Ringer, MD  Summary I have seen and examined [name] at bedside in the presence of family members and reviewed her chart. [name] is a pleasant 64 y.o. female with a Past Medical History of severe COPD on home O2 4 L/minute, history of chronic systolic heart failure, history of CAD who presented with complaints of worsening shortness of breath despite Zithromax/prednisone prescribed on 06/19/2014. She also mentioned that this headache grandkids were sick with a virus around the same time. She was found to have acute exacerbation of COPD. CT chest showed no acute findings on 06/19/2014. Patient reports significant improvement to current treatment modalities including systemic steroids/bronchodilators/antibiotics. She had 2-D echocardiogram which showed normal ejection fraction. Will continue current management. Encourage smoking cessation counseling. Plan COPD exacerbation: Continue IV steroids, empiric Levaquin, scheduled and as needed bronchodilators. Follow clinical course.   . Acute and chronic respiratory failure with hypoxia: Continue with oxygen, slowly taper back to her usual home regimen of 4 L. This is likely secondary to above, but will check a BNP given history of mild chronic systolic heart failure   . 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 Patient follows with Dr. Inda Merlin.  . Protein-calorie malnutrition, severe: Significantly cachectic, likely secondary to severe COPD, underlying malignancy and numerous card issues. Nutrition consult.   . Hyperlipidemia: Continue with statin   . Cardiomyopathy, ischemic - mild to moderate. EF ~45%: Clinically compensated,  BNP normal. If shortness of breath does not improve with the above-noted measures, may need addition of small  amount of diuretics.  . Coronary atherosclerosis: Is status post CABG and PCI, continue with both aspirin and Effient. Currently without any chest pain, shortness of breath is likely secondary to COPD exacerbation.  . Known bilateral carotid artery disease: Reviewed prior documentation, not felt to be a candidate for CEA given extensive cardiac and pulmonary issues. Manage medically.  Further plan will depend as patient's clinical course evolves and further radiologic and laboratory data become available. Patient will be monitored closely.  Above noted plan was discussed with patient/daughter at bedside, they were in agreement.   DVT Prophylaxis: Prophylactic Lovenox   Code Status: DNR-reconfirmed with both patient and daughter at bedside-in fact daughter with out of facility golden yellow DO NOT RESUSCITATE form.  Disposition Plan: Home when stable   Consultants:  None  Procedures:  None  Antibiotics:  Levaquin 06/23/2014>  HPI/Subjective: Complains of some shortness of breath which is better than yesterday.  Objective: Filed Vitals:   06/24/14 1509  BP: 113/61  Pulse: 108  Temp:   Resp: 16    Intake/Output Summary (Last 24 hours) at 06/24/14 1856 Last data filed at 06/24/14 0700  Gross per 24 hour  Intake    960 ml  Output      0 ml  Net    960 ml   There were no vitals filed for this visit.  Exam:   General:  Sitting up. In mild respiratory distress  Cardiovascular: S1-S2 normal. No murmurs. Regular tachycardia.  Respiratory: Reduced air entry bilaterally. Scant wheezing.  Abdomen: Soft and nontender. Normal bowel sounds.  Musculoskeletal: No pedal edema.   Data Reviewed: Basic Metabolic Panel:  Recent Labs Lab 06/19/14 1240 06/23/14 1304 06/24/14 0642  NA 137 136 141  K 4.1 4.3 4.2  CL 97 96  101  CO2 30 32 35*  GLUCOSE 116* 152* 141*  BUN 8 8 9   CREATININE 0.54 0.53 0.52  CALCIUM 9.7 9.1 9.2   Liver Function Tests:  Recent  Labs Lab 06/23/14 1304  AST 25  ALT 27  ALKPHOS 88  BILITOT 0.5  PROT 6.2  ALBUMIN 3.6   No results for input(s): LIPASE, AMYLASE in the last 168 hours. No results for input(s): AMMONIA in the last 168 hours. CBC:  Recent Labs Lab 06/19/14 1240 06/23/14 1304 06/24/14 0642  WBC 9.6 8.8 8.7  HGB 12.3 13.0 11.6*  HCT 36.9 39.9 35.6*  MCV 94.9 98.3 96.0  PLT 217 218 214   Cardiac Enzymes:  Recent Labs Lab 06/19/14 1240 06/23/14 1304  TROPONINI <0.03 <0.03   BNP (last 3 results)  Recent Labs  06/19/14 1240 06/23/14 1523  BNP 63.3 58.5    ProBNP (last 3 results)  Recent Labs  07/20/13 1030 03/03/14 1147  PROBNP 235.6* 475.8*    CBG: No results for input(s): GLUCAP in the last 168 hours.  Recent Results (from the past 240 hour(s))  Culture, blood (routine x 2)     Status: None (Preliminary result)   Collection Time: 06/23/14  5:20 PM  Result Value Ref Range Status   Specimen Description BLOOD RIGHT FOREARM  Final   Special Requests BOTTLES DRAWN AEROBIC AND ANAEROBIC 10CC  Final   Culture   Final           BLOOD CULTURE RECEIVED NO GROWTH TO DATE CULTURE WILL BE HELD FOR 5 DAYS BEFORE ISSUING A FINAL NEGATIVE REPORT Note: Culture results may be compromised due to an excessive volume of blood received in culture bottles. Performed at [facility]    Report Status PENDING  Incomplete  Culture, blood (routine x 2)     Status: None (Preliminary result)   Collection Time: 06/23/14  5:25 PM  Result Value Ref Range Status   Specimen Description BLOOD LEFT ARM  Final   Special Requests BOTTLES DRAWN AEROBIC AND ANAEROBIC 10CC  Final   Culture   Final           BLOOD CULTURE RECEIVED NO GROWTH TO DATE CULTURE WILL BE HELD FOR 5 DAYS BEFORE ISSUING A FINAL NEGATIVE REPORT Note: Culture results may be compromised due to an excessive volume of blood received in culture bottles. Performed at [facility]    Report Status PENDING  Incomplete      Studies: Dg Chest 2 View  06/23/2014   CLINICAL DATA:  Left-sided chest pain with radicular symptoms and the left arm. Three-week history of difficulty breathing. History of prior lung carcinoma.  EXAM: CHEST  2 VIEW  COMPARISON:  Chest radiograph and chest CT June 19, 2014  FINDINGS: There is underlying emphysematous change. A spiculated nodular lesion in the medial right upper lobe seen on CT is not appreciable on radiographic examination. There is scarring in the right upper lobe superiorly and medially. There is no edema or consolidation. Heart size is normal. Pulmonary vascularity reflects underlying emphysema. No adenopathy. Patient is status post coronary artery bypass grafting. There is postoperative change in the lower thoracic and upper lumbar spine regions. No blastic or lytic bone lesions are identified.  IMPRESSION: Underlying emphysema. No edema or consolidation. Spiculated nodular lesion seen on CT is not appreciable on radiographic examination. There is scarring in this region on the chest radiograph, however.   Electronically Signed   By: Lowella Grip M.D.  On: 06/23/2014 14:27    Scheduled Meds: . aspirin EC  81 mg Oral Daily  . atorvastatin  80 mg Oral QHS  . budesonide (PULMICORT) nebulizer solution  0.25 mg Nebulization BID  . cholecalciferol  1,000 Units Oral Daily  . diazepam  5 mg Oral BID  . enoxaparin (LOVENOX) injection  30 mg Subcutaneous Q24H  . ezetimibe  10 mg Oral Daily  . famotidine  20 mg Oral QHS  . feeding supplement (ENSURE COMPLETE)  237 mL Oral QID  . guaiFENesin  600 mg Oral BID  . ipratropium-albuterol  3 mL Nebulization QID  . levofloxacin (LEVAQUIN) IV  750 mg Intravenous Q24H  . methylPREDNISolone (SOLU-MEDROL) injection  60 mg Intravenous Q6H  . montelukast  10 mg Oral QHS  . nicotine  21 mg Transdermal Daily  . pantoprazole  40 mg Oral Daily  . prasugrel  10 mg Oral Daily  . 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 EF ~45%   Acute and chronic respiratory failure with hypoxia    Time spent: 30 minutes.    Preethi Scantlebury  [facility] Pager [phone]. If 7PM-7AM, please contact night-coverage at [email or web address], password [email or web address] 06/24/2014, 6:56 PM  LOS: 1 day

## 2014-06-24 NOTE — Progress Notes (Signed)
Utilization review completed. Therisa Mennella, RN, BSN. 

## 2014-06-24 NOTE — Evaluation (Signed)
Physical Therapy Evaluation Patient Details Name: [name] MRN: [id number] DOB: Aug 05, 1950 Today's Date: 06/24/2014   History of Present Illness  64 y.o. female admitted to [facility] on 06/23/14 for worsening SOB.  Dx with COPD exacerbation.  Pt with significant PMHx of depressive d/o, anxitey, COPD, PVD, CHF, HTN, asthma, lung CA, bil carotid artery disease, TIA, CABG, coronary angioplasty with stents (multiple), and per pt back surgery x 3.   Clinical Impression  Pt is likely close to her mobility baseline which is limited by her respiratory status.  She is supervision overall for gait and mobility and just received a breathing tx before PT worked with her.  She politely is declining HHPT services and just wants the medical MDs to get her breathing back to her baseline.  PT will follow acutely.     Follow Up Recommendations Home health PT;Other (comment) (pt is politely declining this service at this time. )    Equipment Recommendations  None recommended by PT    Recommendations for Other Services   NA    Precautions / Restrictions Precautions Precautions: Other (comment) Precaution Comments: repiratory      Mobility     Transfers Overall transfer level: Needs assistance   Transfers: Sit to/from Stand Sit to Stand: Supervision         General transfer comment: supervision for safety  Ambulation/Gait Ambulation/Gait assistance: Supervision Ambulation Distance (Feet): 150 Feet (seated rest break 1/2 way) Assistive device: None Gait Pattern/deviations: Step-through pattern Gait velocity: decreased Gait velocity interpretation: Below normal speed for age/gender General Gait Details: pt with slow, but steady gait speed.          Balance Overall balance assessment: No apparent balance deficits (not formally assessed)                                           Pertinent Vitals/Pain Pain Assessment: No/denies pain    Home Living Family/patient  expects to be discharged to:: Private residence Living Arrangements: Alone Available Help at Discharge: Family;Available PRN/intermittently Type of Home: House Home Access: Ramped entrance     Home Layout: One level Home Equipment: Walker - 2 wheels;Walker - 4 wheels;Wheelchair - Education officer, community - power;Electric scooter      Prior Function Level of Independence: Needs assistance   Gait / Transfers Assistance Needed: independent with gait  ADL's / Homemaking Assistance Needed: daughter does what housework is done  Comments: uses 4 L O2 [place] at home 24/7, drives        Extremity/Trunk Assessment   Upper Extremity Assessment: Generalized weakness           Lower Extremity Assessment: RLE deficits/detail;Generalized weakness RLE Deficits / Details: right leg is weaker than left leg per pt due to h/o back problems.  3+/5 right leg, but functionally no signs of buckling or foot drag.     Cervical / Trunk Assessment: Other exceptions  Communication   Communication: Other (comment) (due to breathing, slower, labored)  Cognition Arousal/Alertness: Awake/alert Behavior During Therapy: WFL for tasks assessed/performed Overall Cognitive Status: Within Functional Limits for tasks assessed                      General Comments General comments (skin integrity, edema, etc.): Pt with 2-3/4 DOE with gait.  Ambulated with 4 L O2 [place] and O2 sats remained in the high 90s depite DOE.  HR increased to 114 during gati.           Assessment/Plan    PT Assessment Patient needs continued PT services  PT Diagnosis Difficulty walking;Abnormality of gait;Generalized weakness   PT Problem List Decreased strength;Decreased activity tolerance;Decreased mobility;Decreased knowledge of precautions;Cardiopulmonary status limiting activity  PT Treatment Interventions DME instruction;Gait training;Functional mobility training;Therapeutic activities;Therapeutic exercise;Balance  training;Neuromuscular re-education;Patient/family education;Stair training   PT Goals (Current goals can be found in the Care Plan section) Acute Rehab PT Goals Patient Stated Goal: to get her breathing better and go home PT Goal Formulation: With patient Time For Goal Achievement: 07/08/14 Potential to Achieve Goals: Good    Frequency Min 3X/week    End of Session Equipment Utilized During Treatment: Gait belt;Oxygen Activity Tolerance: Patient limited by fatigue;Other (comment) (limited by DOE) Patient left: in chair;with call bell/phone within reach Nurse Communication: Mobility status         Time: [id number] (most of this time pt was telling me her "story") PT Time Calculation (min) (ACUTE ONLY): 58 min   Charges:   PT Evaluation $Initial PT Evaluation Tier I: 1 Procedure          Stetson Pelaez B. [place], [place], DPT [phone]   06/24/2014, 6:12 PM

## 2014-06-24 NOTE — Progress Notes (Addendum)
2D Echocardiogram Complete.  06/24/2014   Katiana Ruland, RDCS   This was a technically difficult study due to lung interference and narrow rib spacing.

## 2014-06-25 DIAGNOSIS — B370 Candidal stomatitis: Secondary | ICD-10-CM | POA: Diagnosis present

## 2014-06-25 LAB — CBC
HCT: 40.5 % (ref 36.0–46.0)
Hemoglobin: 13.3 g/dL (ref 12.0–15.0)
MCH: 31.7 pg (ref 26.0–34.0)
MCHC: 32.8 g/dL (ref 30.0–36.0)
MCV: 96.4 fL (ref 78.0–100.0)
PLATELETS: 258 10*3/uL (ref 150–400)
RBC: 4.2 MIL/uL (ref 3.87–5.11)
RDW: 12.8 % (ref 11.5–15.5)
WBC: 11.2 10*3/uL — ABNORMAL HIGH (ref 4.0–10.5)

## 2014-06-25 LAB — BASIC METABOLIC PANEL
Anion gap: 9 (ref 5–15)
BUN: 13 mg/dL (ref 6–23)
CO2: 35 mmol/L — ABNORMAL HIGH (ref 19–32)
CREATININE: 0.68 mg/dL (ref 0.50–1.10)
Calcium: 9.6 mg/dL (ref 8.4–10.5)
Chloride: 99 mmol/L (ref 96–112)
GFR calc non Af Amer: >90 mL/min (ref >90)
Glucose, Bld: 140 mg/dL — ABNORMAL HIGH (ref 70–99)
Potassium: 4.2 mmol/L (ref 3.5–5.1)
Sodium: 143 mmol/L (ref 135–145)

## 2014-06-25 MED ORDER — WHITE PETROLATUM GEL
Status: AC
  Administered 2014-06-25: 10:00:00
  Filled 2014-06-25: qty 1

## 2014-06-25 MED ORDER — FLUCONAZOLE 100 MG PO TABS
100.0000 mg | ORAL_TABLET | Freq: Every day | ORAL | Status: DC
  Administered 2014-06-25 – 2014-06-26 (×2): 100 mg via ORAL
  Filled 2014-06-25 (×2): qty 1

## 2014-06-25 MED ORDER — PREDNISONE 50 MG PO TABS
50.0000 mg | ORAL_TABLET | Freq: Every day | ORAL | Status: DC
  Administered 2014-06-25: 50 mg via ORAL
  Filled 2014-06-25 (×2): qty 1

## 2014-06-25 NOTE — Progress Notes (Signed)
[facility] PROGRESS NOTE  [name] LHT:[id number] DOB: 02/11/1951 DOA: 06/23/2014 PCP: Tivis Ringer, MD  Summary [name] is a pleasant 64 y.o. female with a Past Medical History of severe COPD on home O2 4 L/minute, history of chronic systolic heart failure, history of CAD who presented with complaints of worsening shortness of breath despite Zithromax/prednisone prescribed on 06/19/2014. She also mentioned that her grandkids were sick with a virus around the same time. She was found to have acute exacerbation of COPD. CT chest showed no acute findings on 06/19/2014. Patient reports significant improvement to current treatment modalities including systemic steroids/bronchodilators/antibiotics and she is ambulating with some dyspnea. She had 2-D echocardiogram which showed normal ejection fraction. Will switch steroids to prednisone, otherwise continue current management. I encourage smoking cessation but patient is not willing to quit at this point. Plan COPD exacerbation: Continue steroids, empiric Levaquin, scheduled and as needed bronchodilators. Follow clinical course.   . Acute and chronic respiratory failure with hypoxia: Continue with oxygen, slowly taper back to her usual home regimen of 4 L. This is likely secondary to above, but will check a BNP given history of mild chronic systolic heart failure   . 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 Patient follows with Dr. Inda Merlin.  . Protein-calorie malnutrition, severe: Significantly cachectic, likely secondary to severe COPD, underlying malignancy and numerous card issues. Nutrition consult.   . Hyperlipidemia: Continue with statin   . Cardiomyopathy, ischemic - mild to moderate. EF ~45%: Clinically compensated, BNP normal. If shortness of breath does not improve with the above-noted measures, may need addition of  small amount of diuretics.  . Coronary atherosclerosis: Is status post CABG and PCI, continue with both aspirin and Effient. Currently without any chest pain, shortness of breath is likely secondary to COPD exacerbation.  . Known bilateral carotid artery disease: Reviewed prior documentation, not felt to be a candidate for CEA given extensive cardiac and pulmonary issues. Manage medically.   DVT Prophylaxis: Prophylactic Lovenox   Code Status: DNR-reconfirmed with both patient and daughter at bedside-in fact daughter with out of facility golden yellow DO NOT RESUSCITATE form.  Disposition Plan: Home when stable   Consultants:  None  Procedures:  None  Antibiotics:  Levaquin 06/23/2014>  HPI/Subjective: Feels better.  Objective: Filed Vitals:   06/25/14 1221  BP: 130/53  Pulse: 94  Temp: 98.3 F (36.8 C)  Resp: 16    Intake/Output Summary (Last 24 hours) at 06/25/14 1328 Last data filed at 06/25/14 0900  Gross per 24 hour  Intake    480 ml  Output      0 ml  Net    480 ml   There were no vitals filed for this visit.  Exam:   General:  Ambulating. Mild respiratory distress  Cardiovascular: S1S2 heard. No murmurs.  Respiratory:Scant wheezing  Abdomen: soft and nontender  Musculoskeletal: No pedal edema.   Data Reviewed: Basic Metabolic Panel:  Recent Labs Lab 06/19/14 1240 06/23/14 1304 06/24/14 0642 06/25/14 0623  NA 137 136 141 143  K 4.1 4.3 4.2 4.2  CL 97 96 101 99  CO2 30 32 35* 35*  GLUCOSE 116* 152* 141* 140*  BUN 8 8 9 13   CREATININE 0.54 0.53 0.52 0.68  CALCIUM 9.7 9.1 9.2 9.6   Liver Function Tests:  Recent Labs Lab 06/23/14 1304  AST 25  ALT 27  ALKPHOS  88  BILITOT 0.5  PROT 6.2  ALBUMIN 3.6   No results for input(s): LIPASE, AMYLASE in the last 168 hours. No results for input(s): AMMONIA in the last 168 hours. CBC:  Recent Labs Lab 06/19/14 1240 06/23/14 1304 06/24/14 0642 06/25/14 0623  WBC 9.6 8.8 8.7  11.2*  HGB 12.3 13.0 11.6* 13.3  HCT 36.9 39.9 35.6* 40.5  MCV 94.9 98.3 96.0 96.4  PLT 217 218 214 258   Cardiac Enzymes:  Recent Labs Lab 06/19/14 1240 06/23/14 1304  TROPONINI <0.03 <0.03   BNP (last 3 results)  Recent Labs  06/19/14 1240 06/23/14 1523  BNP 63.3 58.5    ProBNP (last 3 results)  Recent Labs  07/20/13 1030 03/03/14 1147  PROBNP 235.6* 475.8*    CBG: No results for input(s): GLUCAP in the last 168 hours.  Recent Results (from the past 240 hour(s))  Culture, blood (routine x 2)     Status: None (Preliminary result)   Collection Time: 06/23/14  5:20 PM  Result Value Ref Range Status   Specimen Description BLOOD RIGHT FOREARM  Final   Special Requests BOTTLES DRAWN AEROBIC AND ANAEROBIC 10CC  Final   Culture   Final           BLOOD CULTURE RECEIVED NO GROWTH TO DATE CULTURE WILL BE HELD FOR 5 DAYS BEFORE ISSUING A FINAL NEGATIVE REPORT Note: Culture results may be compromised due to an excessive volume of blood received in culture bottles. Performed at [facility]    Report Status PENDING  Incomplete  Culture, blood (routine x 2)     Status: None (Preliminary result)   Collection Time: 06/23/14  5:25 PM  Result Value Ref Range Status   Specimen Description BLOOD LEFT ARM  Final   Special Requests BOTTLES DRAWN AEROBIC AND ANAEROBIC 10CC  Final   Culture   Final           BLOOD CULTURE RECEIVED NO GROWTH TO DATE CULTURE WILL BE HELD FOR 5 DAYS BEFORE ISSUING A FINAL NEGATIVE REPORT Note: Culture results may be compromised due to an excessive volume of blood received in culture bottles. Performed at [facility]    Report Status PENDING  Incomplete     Studies: Dg Chest 2 View  06/23/2014   CLINICAL DATA:  Left-sided chest pain with radicular symptoms and the left arm. Three-week history of difficulty breathing. History of prior lung carcinoma.  EXAM: CHEST  2 VIEW  COMPARISON:  Chest radiograph and chest CT June 19, 2014   FINDINGS: There is underlying emphysematous change. A spiculated nodular lesion in the medial right upper lobe seen on CT is not appreciable on radiographic examination. There is scarring in the right upper lobe superiorly and medially. There is no edema or consolidation. Heart size is normal. Pulmonary vascularity reflects underlying emphysema. No adenopathy. Patient is status post coronary artery bypass grafting. There is postoperative change in the lower thoracic and upper lumbar spine regions. No blastic or lytic bone lesions are identified.  IMPRESSION: Underlying emphysema. No edema or consolidation. Spiculated nodular lesion seen on CT is not appreciable on radiographic examination. There is scarring in this region on the chest radiograph, however.   Electronically Signed   By: Lowella Grip M.D.   On: 06/23/2014 14:27    Scheduled Meds: . aspirin EC  81 mg Oral Daily  . atorvastatin  80 mg Oral QHS  . budesonide (PULMICORT) nebulizer solution  0.25 mg Nebulization BID  .  cholecalciferol  1,000 Units Oral Daily  . diazepam  5 mg Oral BID  . enoxaparin (LOVENOX) injection  30 mg Subcutaneous Q24H  . ezetimibe  10 mg Oral Daily  . famotidine  20 mg Oral QHS  . feeding supplement (ENSURE COMPLETE)  237 mL Oral QID  . fluconazole  100 mg Oral Daily  . guaiFENesin  600 mg Oral BID  . ipratropium-albuterol  3 mL Nebulization QID  . levofloxacin (LEVAQUIN) IV  750 mg Intravenous Q24H  . magic mouthwash  5 mL Oral QID  . montelukast  10 mg Oral QHS  . nicotine  21 mg Transdermal Daily  . pantoprazole  40 mg Oral Daily  . prasugrel  10 mg Oral Daily  . predniSONE  50 mg Oral Daily  . sodium chloride  3 mL Intravenous Q12H  . 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 EF ~45%   Acute and chronic respiratory failure with hypoxia    Time spent: 20 minutes    Cavin Longman  [facility] Pager [phone]. If 7PM-7AM, please contact night-coverage at [email or web address], password [email or web address] 06/25/2014, 1:28 PM  LOS: 2 days

## 2014-06-25 NOTE — Progress Notes (Signed)
Patient is stating that she plans to sign herself out of the hospital today AMA if Dr. Sanjuana Letters does not allow her to go home. She said that she would be happy to stay in the hospital if she is allowed to leave the floor to walk around outside. Nursing staff cannot confirm that the patient is wanting go smoke outside.

## 2014-06-26 DIAGNOSIS — B370 Candidal stomatitis: Secondary | ICD-10-CM

## 2014-06-26 LAB — CBC
HCT: 36 % (ref 36.0–46.0)
Hemoglobin: 11.9 g/dL — ABNORMAL LOW (ref 12.0–15.0)
MCH: 32.4 pg (ref 26.0–34.0)
MCHC: 33.1 g/dL (ref 30.0–36.0)
MCV: 98.1 fL (ref 78.0–100.0)
Platelets: 239 10*3/uL (ref 150–400)
RBC: 3.67 MIL/uL — ABNORMAL LOW (ref 3.87–5.11)
RDW: 12.8 % (ref 11.5–15.5)
WBC: 12.7 10*3/uL — AB (ref 4.0–10.5)

## 2014-06-26 LAB — COMPREHENSIVE METABOLIC PANEL
ALT: 38 U/L — AB (ref 0–35)
ANION GAP: 6 (ref 5–15)
AST: 28 U/L (ref 0–37)
Albumin: 3.1 g/dL — ABNORMAL LOW (ref 3.5–5.2)
Alkaline Phosphatase: 79 U/L (ref 39–117)
BILIRUBIN TOTAL: 0.3 mg/dL (ref 0.3–1.2)
BUN: 14 mg/dL (ref 6–23)
CALCIUM: 8.8 mg/dL (ref 8.4–10.5)
CO2: 36 mmol/L — ABNORMAL HIGH (ref 19–32)
Chloride: 99 mmol/L (ref 96–112)
Creatinine, Ser: 0.56 mg/dL (ref 0.50–1.10)
GLUCOSE: 130 mg/dL — AB (ref 70–99)
POTASSIUM: 4.2 mmol/L (ref 3.5–5.1)
Sodium: 141 mmol/L (ref 135–145)
Total Protein: 5.5 g/dL — ABNORMAL LOW (ref 6.0–8.3)

## 2014-06-26 MED ORDER — PREDNISONE 20 MG PO TABS
20.0000 mg | ORAL_TABLET | Freq: Every day | ORAL | Status: DC
  Administered 2014-06-26: 20 mg via ORAL
  Filled 2014-06-26 (×2): qty 1

## 2014-06-26 MED ORDER — ENSURE COMPLETE PO LIQD: 237.0000 mL | Freq: Three times a day (TID) | ORAL | Status: DC

## 2014-06-26 MED ORDER — MAGIC MOUTHWASH: 5.0000 mL | Freq: Four times a day (QID) | ORAL | Status: DC

## 2014-06-26 MED ORDER — LEVOFLOXACIN 500 MG PO TABS: 500.0000 mg | ORAL_TABLET | Freq: Every day | ORAL | Status: DC

## 2014-06-26 MED ORDER — PREDNISONE 10 MG PO TABS: ORAL_TABLET | ORAL | Status: DC

## 2014-06-26 MED ORDER — GUAIFENESIN ER 600 MG PO TB12: 600.0000 mg | ORAL_TABLET | Freq: Two times a day (BID) | ORAL | Status: DC

## 2014-06-26 NOTE — Discharge Summary (Signed)
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 EF ~45%   Acute and chronic respiratory failure with hypoxia   Oral thrush   History of present illness/ Hospital Course:  [name] is a pleasant 64 y.o. female with a Past Medical History of severe COPD on home O2 4 L/minute, history of chronic systolic heart failure, history of CAD who presented with complaints of worsening shortness of breath despite Zithromax/prednisone prescribed on 06/19/2014. She also mentioned that her grandkids were sick with a virus around the same time. She was found to have acute exacerbation of COPD. CT chest showed no acute findings on 06/19/2014. Patient reports significant improvement to current treatment modalities including systemic steroids/bronchodilators/antibiotics and she is ambulating with some dyspnea. She had 2-D echocardiogram which showed normal ejection fraction.    COPD exacerbation:  - Wean prednisone, empiric Levaquin to complete 5 days - cont outpt brochodilators    Acute and chronic respiratory failure with hypoxia:  - Continue with oxygen, slowly taper back to her usual home regimen of 4 L. 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 Patient follows with Dr. Inda Merlin.   Protein-calorie malnutrition, severe: Significantly cachectic, likely secondary to severe COPD, underlying malignancy and  numerous card issues. Nutrition consult.    Hyperlipidemia: Continue with statin    Cardiomyopathy, ischemic - mild to moderate. EF ~45%: Clinically compensated, BNP normal. If shortness of breath does not improve with the above-noted measures, may need addition of small amount of diuretics.   Coronary atherosclerosis: Is status post CABG and PCI, continue with both aspirin and Effient. Currently without any chest pain, shortness of breath is likely secondary to COPD exacerbation.  Known bilateral carotid artery disease: Reviewed prior documentation, not felt to be a candidate for CEA given extensive cardiac and pulmonary issues. Manage medically.   Procedures:  2 D ECHO   Discharge Exam: There were no vitals filed for this visit. Filed Vitals:   06/26/14 0546  BP: 124/75  Pulse: 82  Temp: 97.5 F (36.4 C)  Resp: 16    General: AAO x 3, no distress Cardiovascular: RRR, no murmurs  Respiratory: clear to auscultation bilaterally GI: soft, non-tender, non-distended, bowel sound positive  Discharge Instructions You were cared for by a hospitalist during your hospital stay. If you have any questions about your discharge medications or the care you received while you were in the hospital after you are discharged, you can call the unit and asked to speak with the hospitalist on call if the hospitalist that took care of you is not available. Once you are discharged, your primary care physician will handle any further medical issues. Please note that NO REFILLS for any discharge medications will be authorized once you are discharged, as it is imperative that you return to your primary care physician (or establish a relationship with a primary care physician if you do not have one) for your aftercare needs so that they can reassess  your need for medications and monitor your lab values.  Discharge Instructions    Diet - low sodium heart healthy    Complete by:  As directed      Increase  activity slowly    Complete by:  As directed             Medication List    STOP taking these medications        azithromycin 250 MG tablet  Commonly known as:  ZITHROMAX      TAKE these medications        albuterol 108 (90 BASE) MCG/ACT inhaler  Commonly known as:  PROVENTIL HFA;VENTOLIN HFA  Inhale 2 puffs into the lungs every 6 (six) hours as needed for wheezing or shortness of breath.     aspirin 81 MG EC tablet  Take 81 mg by mouth daily.     atorvastatin 80 MG tablet  Commonly known as:  LIPITOR  Take 80 mg by mouth at bedtime.     benzonatate 100 MG capsule  Commonly known as:  TESSALON  Take 1 capsule (100 mg total) by mouth 3 (three) times daily as needed for cough.     diazepam 5 MG tablet  Commonly known as:  VALIUM  Take 5 mg by mouth 2 (two) times daily.     EFFIENT 10 MG Tabs tablet  Generic drug:  prasugrel  TAKE 1 TABLET BY MOUTH EVERY DAY     famotidine 20 MG tablet  Commonly known as:  PEPCID  Take 20 mg by mouth at bedtime.     feeding supplement (ENSURE COMPLETE) Liqd  Take 237 mLs by mouth 3 (three) times daily between meals.     fluconazole 100 MG tablet  Commonly known as:  DIFLUCAN  Take 1 tablet (100 mg total) by mouth daily.     fluticasone 50 MCG/ACT nasal spray  Commonly known as:  FLONASE  Place 1 spray into both nostrils daily as needed for allergies or rhinitis.     Fluticasone Furoate-Vilanterol 100-25 MCG/INH Aepb  Commonly known as:  BREO ELLIPTA  Inhale 1 puff into the lungs daily.     guaiFENesin 600 MG 12 hr tablet  Commonly known as:  MUCINEX  Take 1 tablet (600 mg total) by mouth 2 (two) times daily.     HYDROcodone-acetaminophen 10-325 MG per tablet  Commonly known as:  NORCO  Take 1 tablet by mouth every 6 (six) hours as needed for moderate pain.     levofloxacin 500 MG tablet  Commonly known as:  LEVAQUIN  Take 1 tablet (500 mg total) by mouth daily.     loratadine 10 MG tablet  Commonly known as:   CLARITIN  Take 1 tablet (10 mg total) by mouth daily as needed for allergies.     magic mouthwash Soln  Take 5 mLs by mouth 4 (four) times daily.     montelukast 10 MG tablet  Commonly known as:  SINGULAIR  Take 10 mg by mouth at bedtime.     nitroGLYCERIN 0.4 MG SL tablet  Commonly known as:  NITROSTAT  Place 0.4 mg under the tongue every 5 (five) minutes as needed. For chest pain     omeprazole 20 MG capsule  Commonly known as:  PRILOSEC  Take 20 mg by mouth 2 (two) times daily before a meal.     OXYGEN  Inhale 4 L into the lungs continuous.     predniSONE 10 MG tablet  Commonly known  as:  DELTASONE  Take 3 tabs PO x 2 days followed by 2 tabs PO x 2 days followed by 1 tab PO x 2 days     SYMBICORT 160-4.5 MCG/ACT inhaler  Generic drug:  budesonide-formoterol  Inhale 2 puffs into the lungs 2 (two) times daily.     tiotropium 18 MCG inhalation capsule  Commonly known as:  SPIRIVA  Place 1 capsule (18 mcg total) into inhaler and inhale daily.     Vitamin D-3 5000 UNITS Tabs  Take 1 tablet by mouth daily.     ZETIA 10 MG tablet  Generic drug:  ezetimibe  TAKE 1 TABLET BY MOUTH EVERY DAY       Allergies  Allergen Reactions  . Amiodarone Other (See Comments)    Drops HR too low, SOB, fatigue  . Clopidogrel Bisulfate Hives  . Dilaudid [Hydromorphone Hcl] Other (See Comments)    "I couldn't function for 2 days."  . Multaq [Dronedarone] Other (See Comments)    Heart races       The results of significant diagnostics from this hospitalization (including imaging, microbiology, ancillary and laboratory) are listed below for reference.    Significant Diagnostic Studies: Dg Chest 2 View  06/23/2014   CLINICAL DATA:  Left-sided chest pain with radicular symptoms and the left arm. Three-week history of difficulty breathing. History of prior lung carcinoma.  EXAM: CHEST  2 VIEW  COMPARISON:  Chest radiograph and chest CT June 19, 2014  FINDINGS: There is underlying  emphysematous change. A spiculated nodular lesion in the medial right upper lobe seen on CT is not appreciable on radiographic examination. There is scarring in the right upper lobe superiorly and medially. There is no edema or consolidation. Heart size is normal. Pulmonary vascularity reflects underlying emphysema. No adenopathy. Patient is status post coronary artery bypass grafting. There is postoperative change in the lower thoracic and upper lumbar spine regions. No blastic or lytic bone lesions are identified.  IMPRESSION: Underlying emphysema. No edema or consolidation. Spiculated nodular lesion seen on CT is not appreciable on radiographic examination. There is scarring in this region on the chest radiograph, however.   Electronically Signed   By: Lowella Grip M.D.   On: 06/23/2014 14:27   Dg Chest 2 View  06/19/2014   CLINICAL DATA:  64 year old female with shortness of breath and chest pain for 1 week. Initial encounter. Current history of right lung squamous cell carcinoma.  EXAM: CHEST  2 VIEW  COMPARISON:  Chest CT 05/08/2014 and earlier.  FINDINGS: Chronic hyperinflation and hilar retraction. Stable cardiac size and mediastinal contours. No pneumothorax, pulmonary edema, pleural effusion or acute pulmonary opacity. Sequelae of CABG and thoracolumbar spinal fusion. Osteopenia. No acute osseous abnormality identified.  IMPRESSION: No acute cardiopulmonary abnormality identified. Chronic lung disease with hyperinflation.   Electronically Signed   By: Lars Pinks M.D.   On: 06/19/2014 13:38   Ct Angio Chest Pe W/cm &/or Wo Cm  06/19/2014   CLINICAL DATA:  Cough for 1 week.  EXAM: CT ANGIOGRAPHY CHEST WITH CONTRAST  TECHNIQUE: Multidetector CT imaging of the chest was performed using the standard protocol during bolus administration of intravenous contrast. Multiplanar CT image reconstructions and MIPs were obtained to evaluate the vascular anatomy.  CONTRAST:  71mL OMNIPAQUE IOHEXOL 350 MG/ML SOLN   COMPARISON:  10/05/2013, 06/05/2013, 04/25/2012  FINDINGS: Cardiovascular: There is good opacification of the pulmonary vasculature. There is no pulmonary embolism. The thoracic aorta is intact, with no aneurysm or dissection.  There is prior sternotomy and CABG  Lungs: There is no interval change in the spiculated right upper lobe nodule, or in the adjacent subpleural major fissure thickening. There also is stable curvilinear scarring in the medial right apical region. No new or enlarging pulmonary nodules are evident.  Central airways: Patent  Effusions: None  Lymphadenopathy: Axillary, mediastinal and hilar regions are unremarkable with no evidence of pathologic adenopathy.  Esophagus: Unremarkable  Upper abdomen: Unremarkable  Musculoskeletal: Extensive thoracolumbar spinal fixation hardware, causing mild metal artifact.  Review of the MIP images confirms the above findings.  IMPRESSION: 1. No evidence of pulmonary embolism 2. No new or enlarging pulmonary parenchymal lesions 3. No acute intrathoracic findings   Electronically Signed   By: Andreas Newport M.D.   On: 06/19/2014 15:17    Microbiology: Recent Results (from the past 240 hour(s))  Culture, blood (routine x 2)     Status: None (Preliminary result)   Collection Time: 06/23/14  5:20 PM  Result Value Ref Range Status   Specimen Description BLOOD RIGHT FOREARM  Final   Special Requests BOTTLES DRAWN AEROBIC AND ANAEROBIC 10CC  Final   Culture   Final           BLOOD CULTURE RECEIVED NO GROWTH TO DATE CULTURE WILL BE HELD FOR 5 DAYS BEFORE ISSUING A FINAL NEGATIVE REPORT Note: Culture results may be compromised due to an excessive volume of blood received in culture bottles. Performed at [facility]    Report Status PENDING  Incomplete  Culture, blood (routine x 2)     Status: None (Preliminary result)   Collection Time: 06/23/14  5:25 PM  Result Value Ref Range Status   Specimen Description BLOOD LEFT ARM  Final   Special  Requests BOTTLES DRAWN AEROBIC AND ANAEROBIC 10CC  Final   Culture   Final           BLOOD CULTURE RECEIVED NO GROWTH TO DATE CULTURE WILL BE HELD FOR 5 DAYS BEFORE ISSUING A FINAL NEGATIVE REPORT Note: Culture results may be compromised due to an excessive volume of blood received in culture bottles. Performed at [facility]    Report Status PENDING  Incomplete     Labs: Basic Metabolic Panel:  Recent Labs Lab 06/19/14 1240 06/23/14 1304 06/24/14 0642 06/25/14 0623 06/26/14 0535  NA 137 136 141 143 141  K 4.1 4.3 4.2 4.2 4.2  CL 97 96 101 99 99  CO2 30 32 35* 35* 36*  GLUCOSE 116* 152* 141* 140* 130*  BUN 8 8 9 13 14   CREATININE 0.54 0.53 0.52 0.68 0.56  CALCIUM 9.7 9.1 9.2 9.6 8.8   Liver Function Tests:  Recent Labs Lab 06/23/14 1304 06/26/14 0535  AST 25 28  ALT 27 38*  ALKPHOS 88 79  BILITOT 0.5 0.3  PROT 6.2 5.5*  ALBUMIN 3.6 3.1*   No results for input(s): LIPASE, AMYLASE in the last 168 hours. No results for input(s): AMMONIA in the last 168 hours. CBC:  Recent Labs Lab 06/19/14 1240 06/23/14 1304 06/24/14 0642 06/25/14 0623 06/26/14 0535  WBC 9.6 8.8 8.7 11.2* 12.7*  HGB 12.3 13.0 11.6* 13.3 11.9*  HCT 36.9 39.9 35.6* 40.5 36.0  MCV 94.9 98.3 96.0 96.4 98.1  PLT 217 218 214 258 239   Cardiac Enzymes:  Recent Labs Lab 06/19/14 1240 06/23/14 1304  TROPONINI <0.03 <0.03   BNP: BNP (last 3 results)  Recent Labs  06/19/14 1240 06/23/14 1523  BNP 63.3 58.5  ProBNP (last 3 results)  Recent Labs  07/20/13 1030 03/03/14 1147  PROBNP 235.6* 475.8*    CBG: No results for input(s): GLUCAP in the last 168 hours.     SignedDebbe Odea, MD [facility] 06/26/2014, 11:18 AM

## 2014-06-28 ENCOUNTER — Other Ambulatory Visit: Payer: Self-pay | Admitting: Pulmonary Disease

## 2014-06-29 LAB — CULTURE, BLOOD (ROUTINE X 2)
CULTURE: NO GROWTH
Culture: NO GROWTH

## 2014-07-02 ENCOUNTER — Telehealth: Payer: Self-pay | Admitting: Pulmonary Disease

## 2014-07-02 MED ORDER — ALBUTEROL SULFATE HFA 108 (90 BASE) MCG/ACT IN AERS: 2.0000 | INHALATION_SPRAY | Freq: Four times a day (QID) | RESPIRATORY_TRACT | Status: DC | PRN

## 2014-07-02 NOTE — Telephone Encounter (Signed)
proair sent to pharmacy.  Nothing further needed.

## 2014-07-15 ENCOUNTER — Telehealth: Payer: Self-pay | Admitting: Pulmonary Disease

## 2014-07-15 ENCOUNTER — Other Ambulatory Visit: Payer: Self-pay | Admitting: Pulmonary Disease

## 2014-07-15 MED ORDER — FLUCONAZOLE 100 MG PO TABS: 100.0000 mg | ORAL_TABLET | Freq: Every day | ORAL | Status: DC

## 2014-07-15 MED ORDER — MAGIC MOUTHWASH: 5.0000 mL | Freq: Four times a day (QID) | ORAL | Status: DC

## 2014-07-15 MED ORDER — FAMOTIDINE 20 MG PO TABS: 20.0000 mg | ORAL_TABLET | Freq: Every day | ORAL | Status: DC

## 2014-07-15 NOTE — Telephone Encounter (Signed)
Okay to send refill for these.

## 2014-07-15 NOTE — Telephone Encounter (Signed)
Spoke with pt. States that she has thrush in her mouth again. Wants refills on magic mouth wash and diflucan.  VS - please advise on refills. Thanks.

## 2014-07-15 NOTE — Telephone Encounter (Signed)
Called and spoke with pt and she is aware of refills that have been sent to the pharmacy for her.  Nothing further is needed.

## 2014-07-28 ENCOUNTER — Emergency Department (HOSPITAL_COMMUNITY): Payer: Medicaid Other

## 2014-07-28 ENCOUNTER — Emergency Department (HOSPITAL_COMMUNITY)
Admission: EM | Admit: 2014-07-28 | Discharge: 2014-07-28 | Disposition: A | Discharge status: Home / Self Care | Payer: Medicaid Other | Attending: Emergency Medicine | Admitting: Emergency Medicine

## 2014-07-28 ENCOUNTER — Encounter (HOSPITAL_COMMUNITY): Payer: Self-pay | Admitting: *Deleted

## 2014-07-28 DIAGNOSIS — Z8669 Personal history of other diseases of the nervous system and sense organs: Secondary | ICD-10-CM | POA: Insufficient documentation

## 2014-07-28 DIAGNOSIS — Z951 Presence of aortocoronary bypass graft: Secondary | ICD-10-CM | POA: Insufficient documentation

## 2014-07-28 DIAGNOSIS — Z955 Presence of coronary angioplasty implant and graft: Secondary | ICD-10-CM | POA: Insufficient documentation

## 2014-07-28 DIAGNOSIS — Z8673 Personal history of transient ischemic attack (TIA), and cerebral infarction without residual deficits: Secondary | ICD-10-CM | POA: Diagnosis not present

## 2014-07-28 DIAGNOSIS — Z87891 Personal history of nicotine dependence: Secondary | ICD-10-CM | POA: Insufficient documentation

## 2014-07-28 DIAGNOSIS — Z7951 Long term (current) use of inhaled steroids: Secondary | ICD-10-CM | POA: Insufficient documentation

## 2014-07-28 DIAGNOSIS — Z792 Long term (current) use of antibiotics: Secondary | ICD-10-CM | POA: Insufficient documentation

## 2014-07-28 DIAGNOSIS — I2510 Atherosclerotic heart disease of native coronary artery without angina pectoris: Secondary | ICD-10-CM | POA: Insufficient documentation

## 2014-07-28 DIAGNOSIS — Z85118 Personal history of other malignant neoplasm of bronchus and lung: Secondary | ICD-10-CM | POA: Diagnosis not present

## 2014-07-28 DIAGNOSIS — I509 Heart failure, unspecified: Secondary | ICD-10-CM | POA: Insufficient documentation

## 2014-07-28 DIAGNOSIS — I10 Essential (primary) hypertension: Secondary | ICD-10-CM | POA: Insufficient documentation

## 2014-07-28 DIAGNOSIS — Z8619 Personal history of other infectious and parasitic diseases: Secondary | ICD-10-CM | POA: Insufficient documentation

## 2014-07-28 DIAGNOSIS — E785 Hyperlipidemia, unspecified: Secondary | ICD-10-CM | POA: Diagnosis not present

## 2014-07-28 DIAGNOSIS — Z8659 Personal history of other mental and behavioral disorders: Secondary | ICD-10-CM | POA: Insufficient documentation

## 2014-07-28 DIAGNOSIS — R0602 Shortness of breath: Secondary | ICD-10-CM | POA: Diagnosis present

## 2014-07-28 DIAGNOSIS — J441 Chronic obstructive pulmonary disease with (acute) exacerbation: Secondary | ICD-10-CM | POA: Insufficient documentation

## 2014-07-28 DIAGNOSIS — Z79899 Other long term (current) drug therapy: Secondary | ICD-10-CM | POA: Insufficient documentation

## 2014-07-28 DIAGNOSIS — K219 Gastro-esophageal reflux disease without esophagitis: Secondary | ICD-10-CM | POA: Insufficient documentation

## 2014-07-28 DIAGNOSIS — I252 Old myocardial infarction: Secondary | ICD-10-CM | POA: Diagnosis not present

## 2014-07-28 DIAGNOSIS — Z7982 Long term (current) use of aspirin: Secondary | ICD-10-CM | POA: Diagnosis not present

## 2014-07-28 LAB — URINALYSIS, ROUTINE W REFLEX MICROSCOPIC
Bilirubin Urine: NEGATIVE
Glucose, UA: NEGATIVE mg/dL
Hgb urine dipstick: NEGATIVE
KETONES UR: NEGATIVE mg/dL
LEUKOCYTES UA: NEGATIVE
Nitrite: NEGATIVE
PH: 6 (ref 5.0–8.0)
PROTEIN: NEGATIVE mg/dL
Specific Gravity, Urine: 1.006 (ref 1.005–1.030)
Urobilinogen, UA: 0.2 mg/dL (ref 0.0–1.0)

## 2014-07-28 LAB — CBC WITH DIFFERENTIAL/PLATELET
BASOS ABS: 0 10*3/uL (ref 0.0–0.1)
Basophils Relative: 0 % (ref 0–1)
EOS PCT: 1 % (ref 0–5)
Eosinophils Absolute: 0 10*3/uL (ref 0.0–0.7)
HCT: 39.6 % (ref 36.0–46.0)
HEMOGLOBIN: 13.2 g/dL (ref 12.0–15.0)
Lymphocytes Relative: 22 % (ref 12–46)
Lymphs Abs: 1.4 10*3/uL (ref 0.7–4.0)
MCH: 31.7 pg (ref 26.0–34.0)
MCHC: 33.3 g/dL (ref 30.0–36.0)
MCV: 95.2 fL (ref 78.0–100.0)
MONO ABS: 0.3 10*3/uL (ref 0.1–1.0)
Monocytes Relative: 4 % (ref 3–12)
Neutro Abs: 4.9 10*3/uL (ref 1.7–7.7)
Neutrophils Relative %: 73 % (ref 43–77)
Platelets: 222 10*3/uL (ref 150–400)
RBC: 4.16 MIL/uL (ref 3.87–5.11)
RDW: 13.7 % (ref 11.5–15.5)
WBC: 6.6 10*3/uL (ref 4.0–10.5)

## 2014-07-28 LAB — TROPONIN I: Troponin I: <0.03 ng/mL (ref <0.031)

## 2014-07-28 LAB — BASIC METABOLIC PANEL
Anion gap: 4 — ABNORMAL LOW (ref 5–15)
BUN: 7 mg/dL (ref 6–23)
CALCIUM: 9.2 mg/dL (ref 8.4–10.5)
CHLORIDE: 104 mmol/L (ref 96–112)
CO2: 34 mmol/L — AB (ref 19–32)
Creatinine, Ser: 0.81 mg/dL (ref 0.50–1.10)
GFR calc Af Amer: 89 mL/min — ABNORMAL LOW (ref >90)
GFR calc non Af Amer: 77 mL/min — ABNORMAL LOW (ref >90)
Glucose, Bld: 103 mg/dL — ABNORMAL HIGH (ref 70–99)
Potassium: 3.3 mmol/L — ABNORMAL LOW (ref 3.5–5.1)
SODIUM: 142 mmol/L (ref 135–145)

## 2014-07-28 MED ORDER — PREDNISONE 10 MG PO TABS: 40.0000 mg | ORAL_TABLET | Freq: Every day | ORAL | Status: DC

## 2014-07-28 MED ORDER — AZITHROMYCIN 250 MG PO TABS: 250.0000 mg | ORAL_TABLET | Freq: Every day | ORAL | Status: DC

## 2014-07-28 MED ORDER — MORPHINE SULFATE 4 MG/ML IJ SOLN
4.0000 mg | Freq: Once | INTRAMUSCULAR | Status: AC
  Administered 2014-07-28: 4 mg via INTRAVENOUS
  Filled 2014-07-28: qty 1

## 2014-07-28 MED ORDER — POTASSIUM CHLORIDE CRYS ER 20 MEQ PO TBCR
20.0000 meq | EXTENDED_RELEASE_TABLET | Freq: Once | ORAL | Status: AC
  Administered 2014-07-28: 20 meq via ORAL
  Filled 2014-07-28: qty 1

## 2014-07-28 NOTE — ED Notes (Signed)
NAD at this time. Pt is stable and leaving with family.   

## 2014-07-28 NOTE — ED Notes (Signed)
Per [facility] pt has a hx of Lung CA and asthma, Is on 2L of O2 at home. Pt awoke from her sleep with SOB and cough that was worse than usual.  She stated that she was not getting any relief from her inhaler.  10mg  of albuterol, .5mg  of atrovent and solumedrol was given by EMS. VS are as follows: BP: 121/51, HR: 103, Resp: 30 and O2Sat 98% on 8L.

## 2014-07-28 NOTE — ED Notes (Signed)
Pt left for XRay

## 2014-07-28 NOTE — Discharge Instructions (Signed)

## 2014-07-28 NOTE — ED Provider Notes (Signed)
CSN: [id number]     Arrival date & time 07/28/14  1629 History   First MD Initiated Contact with Patient 07/28/14 1630     Chief Complaint  Patient presents with  . Shortness of Breath     Patient is a 64 y.o. female presenting with shortness of breath. The history is provided by the patient. No language interpreter was used.  Shortness of Breath  [name] presents for evaluation of shortness of breath. She reports 3-4 days of increasing shortness of breath with weak cough. Today her shortness of breath and cough became much more severe. She has nausea and cramping in her legs. She has subjective fever at home and associated headache. She has been trying her albuterol treatments at home without any relief in her symptoms. She is on 4 L home oxygen for COPD. Symptoms are moderate, constant, worsening. She denies any associated chest pain, abdominal pain, vomiting, diarrhea, leg swelling. She received 2 albuterol treatments by EMS as well as Solu-Medrol. She had partial improvement in her symptoms with these treatments.  Past Medical History  Diagnosis Date  . Acute myocardial infarction, unspecified site, episode of care unspecified 2011    VF arrest. Ruled in, cath with no clear culprit lesion, inferior WMA on LV gram, medical therapy  . Esophageal dysmotility   . Hemorrhoids   . Esophageal stricture   . Diaphragmatic hernia without mention of obstruction or gangrene   . 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 CAD (coronary artery disease)     CABG'96., Xience DES -VG-RCA (01/12/13)  . Peripheral neuropathy   . Hyperlipidemia   . Peptic ulcer, unspecified site, unspecified as acute or chronic, without mention of hemorrhage, perforation, or obstruction   . Depressive disorder, not elsewhere classified   . Anxiety state, unspecified   . COPD (chronic obstructive pulmonary disease)   . GERD (gastroesophageal reflux disease)   . Candida esophagitis     on multiple occasions   . C. difficile diarrhea   . PVD (peripheral vascular disease)   . Headache(784.0)   . Weight loss 06/13/2011    scheduled here by family doctor  . CHF (congestive heart failure) 2011  . Asthma   . Hypertension   . History of nuclear stress test 07/14/2010    dipyriadmole; normal pattern of perfusion, low risk   . History of tobacco use     quit 05/2009  . Non-small cell carcinoma of lung, stage 3     Dr. Roxan Hockey  . History of radiation therapy 02/28/2012-04/14/2012    right lung  . Bilateral carotid artery disease   . TIA (transient ischemic attack)     amaurosis fugax  . Tubular adenoma of colon    Past Surgical History  Procedure Laterality Date  . Tonsillectomy  1974  . Fracture lt jaw  1976  . Breast surgery  1969, 1970    bilateral  . Epigastric hernia repair  2008    Dr Roxan Hockey  . Coronary artery bypass graft  02/01/1994    left internal mammary to LAD, vein graft to diagonal/post descending, post lateral, marginals, circumfle, diagonal  . Carpal tunnel release    . Total abdominal hysterectomy  1998  . Laminectomy    . Subclavian vein angioplasty / stenting Left   . Transthoracic echocardiogram  08/14/2009    EF=>55%, normal LV systolic function, mild hypokinesis of basal segments of inferolateral & anterolater walls; mild MR/TR; AV mildly sclerotic  . Coronary angioplasty with  stent placement  08/29/2000    stent to RCA (Dr. Corky Downs)  . Cardiac catheterization  01/17/2002    patent grafts (Dr. Marella Chimes)  . Cardiac catheterization  02/13/2004    significant L main & 3 vessel CAD, patents grafts, mild distal aortic disease (Dr. Gerrie Nordmann)  . Cardiac catheterization  07/14/2005    patent grafts, normal LV function (Dr. Adora Fridge)  . Cardiac catheterization  09/26/2008    patent grafts, normal LV function (Dr. Adora Fridge)  . Cardiac catheterization  05/31/2009    patent grafts, inferior wall motion abnormality (Dr. Adora Fridge)  . Cardiac catheterization  10/03/2009     normal LV function; 100% native RCA, circumflex, distal LAD occlusion  . Coronary angioplasty with stent placement  01/12/2013    Xience DES to SVG-RCA distal  . Carotid doppler  08/14/2009    R & L ICAs - 0-49% diameter reduction   . Left heart catheterization with coronary/graft angiogram N/A 01/12/2013    Procedure: LEFT HEART CATHETERIZATION WITH Beatrix Fetters;  Surgeon: Leonie Man, MD;  Location: [facility];  Service: Cardiovascular;  Laterality: N/A;   Family History  Problem Relation Age of Onset  . Lung cancer Father     also CVA  . Heart disease Father   . Asthma Father   . Allergies Father   . Cancer Father   . Heart disease Mother     also CVA, MI  . Brain cancer Mother   . Heart disease Sister   . Colon cancer Neg Hx   . Lung cancer Brother 52    also ENT cancer  . Hypertension Brother 46    also aneurysm, early CAD  . Breast cancer Other     family hx   History  Substance Use Topics  . Smoking status: Former Smoker -- 1.50 packs/day for 30 years    Types: Cigarettes    Quit date: 05/24/2013  . Smokeless tobacco: Never Used  . Alcohol Use: No   OB History    No data available     Review of Systems  Respiratory: Positive for shortness of breath.   All other systems reviewed and are negative.     Allergies  Amiodarone; Clopidogrel bisulfate; Dilaudid; and Multaq  Home Medications   Prior to Admission medications   Medication Sig Start Date End Date Taking? Authorizing Provider  albuterol (PROVENTIL HFA;VENTOLIN HFA) 108 (90 BASE) MCG/ACT inhaler Inhale 2 puffs into the lungs every 6 (six) hours as needed for wheezing or shortness of breath. 07/02/14   Chesley Mires, MD  Alum & Mag Hydroxide-Simeth (MAGIC MOUTHWASH) SOLN Take 5 mLs by mouth 4 (four) times daily. 07/15/14   Chesley Mires, MD  aspirin 81 MG EC tablet Take 81 mg by mouth daily.      Historical Provider, MD  atorvastatin (LIPITOR) 80 MG tablet Take 80 mg by mouth at bedtime.     Historical Provider, MD  benzonatate (TESSALON) 100 MG capsule Take 1 capsule (100 mg total) by mouth 3 (three) times daily as needed for cough. Patient not taking: Reported on 06/23/2014 03/05/14   Ripudeep Krystal Eaton, MD  Cholecalciferol (VITAMIN D-3) 5000 UNITS TABS Take 1 tablet by mouth daily.    Historical Provider, MD  diazepam (VALIUM) 5 MG tablet Take 5 mg by mouth 2 (two) times daily.    Historical Provider, MD  EFFIENT 10 MG TABS tablet TAKE 1 TABLET BY MOUTH EVERY DAY 03/18/14   Pearletha Forge  Gwenlyn Found, MD  famotidine (PEPCID) 20 MG tablet Take 1 tablet (20 mg total) by mouth at bedtime. 07/15/14   Noralee Space, MD  feeding supplement, ENSURE COMPLETE, (ENSURE COMPLETE) LIQD Take 237 mLs by mouth 3 (three) times daily between meals. 06/26/14   Debbe Odea, MD  fluconazole (DIFLUCAN) 100 MG tablet Take 1 tablet (100 mg total) by mouth daily. 07/15/14   Noralee Space, MD  fluticasone (FLONASE) 50 MCG/ACT nasal spray Place 1 spray into both nostrils daily as needed for allergies or rhinitis. 03/05/14   Ripudeep Krystal Eaton, MD  Fluticasone Furoate-Vilanterol (BREO ELLIPTA) 100-25 MCG/INH AEPB Inhale 1 puff into the lungs daily. 05/22/14   Chesley Mires, MD  guaiFENesin (MUCINEX) 600 MG 12 hr tablet Take 1 tablet (600 mg total) by mouth 2 (two) times daily. 06/26/14   Debbe Odea, MD  HYDROcodone-acetaminophen (NORCO) 10-325 MG per tablet Take 1 tablet by mouth every 6 (six) hours as needed for moderate pain.     Historical Provider, MD  levofloxacin (LEVAQUIN) 500 MG tablet Take 1 tablet (500 mg total) by mouth daily. 06/26/14   Debbe Odea, MD  loratadine (CLARITIN) 10 MG tablet Take 1 tablet (10 mg total) by mouth daily as needed for allergies. 03/05/14   Ripudeep Krystal Eaton, MD  montelukast (SINGULAIR) 10 MG tablet Take 10 mg by mouth at bedtime.    Historical Provider, MD  nitroGLYCERIN (NITROSTAT) 0.4 MG SL tablet Place 0.4 mg under the tongue every 5 (five) minutes as needed. For chest pain    Historical Provider,  MD  omeprazole (PRILOSEC) 20 MG capsule Take 20 mg by mouth 2 (two) times daily before a meal.    Historical Provider, MD  OXYGEN-HELIUM IN Inhale 4 L into the lungs continuous.    Historical Provider, MD  predniSONE (DELTASONE) 10 MG tablet Take 3 tabs PO x 2 days followed by 2 tabs PO x 2 days followed by 1 tab PO x 2 days 06/26/14   Debbe Odea, MD  PROAIR HFA 108 (90 BASE) MCG/ACT inhaler INHALE 2 PUFFS INTO THE LUNGS EVERY 6 HOURS AS NEEDED FOR WHEEZING. 07/09/14   Chesley Mires, MD  SYMBICORT 160-4.5 MCG/ACT inhaler Inhale 2 puffs into the lungs 2 (two) times daily. 06/08/14   Historical Provider, MD  tiotropium (SPIRIVA) 18 MCG inhalation capsule Place 1 capsule (18 mcg total) into inhaler and inhale daily. 05/15/14   Noralee Space, MD  ZETIA 10 MG tablet TAKE 1 TABLET BY MOUTH EVERY DAY 03/25/14   Lorretta Harp, MD   BP 134/53 mmHg  Pulse 93  Temp(Src) 98.4 F (36.9 C) (Oral)  Resp 18  SpO2 98% Physical Exam  Constitutional: She is oriented to person, place, and time.  Frail, chronically ill-appearing  HENT:  Head: Normocephalic and atraumatic.  Cardiovascular: Normal rate and regular rhythm.   No murmur heard. Pulmonary/Chest:  tachypneic with decreased breath sounds bilaterally, occasional end expiratory wheezes.  Abdominal: Soft. There is no tenderness. There is no rebound and no guarding.  Musculoskeletal: She exhibits no edema or tenderness.  Neurological: She is alert and oriented to person, place, and time.  Skin: Skin is warm and dry.  Psychiatric: She has a normal mood and affect. Her behavior is normal.  Nursing note and vitals reviewed.   ED Course  Procedures (including critical care time) Labs Review Labs Reviewed  BASIC METABOLIC PANEL - Abnormal; Notable for the following:    Potassium 3.3 (*)    CO2 34 (*)  Glucose, Bld 103 (*)    GFR calc non Af Amer 77 (*)    GFR calc Af Amer 89 (*)    Anion gap 4 (*)    All other components within normal limits   URINALYSIS, ROUTINE W REFLEX MICROSCOPIC  TROPONIN I  CBC WITH DIFFERENTIAL/PLATELET    Imaging Review Dg Chest 2 View  07/28/2014   CLINICAL DATA:  Shortness of breath. History of asthma and chronic bronchitis. History of lung cancer. Initial encounter.  EXAM: CHEST  2 VIEW  COMPARISON:  Radiographs 06/23/2014.  CT 06/19/2014.  FINDINGS: The heart size and mediastinal contours are stable status post median sternotomy and CABG. The lungs are hyperinflated but clear. There is no evidence of pleural effusion or pneumothorax. The visualized thoracolumbar spinal fixation hardware appears unchanged. Left subclavian stent noted.  IMPRESSION: No active cardiopulmonary process.   Electronically Signed   By: Richardean Sale M.D.   On: 07/28/2014 17:48     EKG Interpretation   Date/Time:  Sunday July 28 2014 16:31:57 EST Ventricular Rate:  98 PR Interval:  133 QRS Duration: 92 QT Interval:  371 QTC Calculation: 474 R Axis:   40 Text Interpretation:  Sinus rhythm Multiform ventricular premature  complexes Probable left atrial enlargement RSR' in V1 or V2, probably  normal variant Confirmed by Hazle Coca [phone]) on 07/28/2014 4:36:52 PM      MDM   Final diagnoses:  COPD with acute exacerbation    Patient with history of COPD and lung cancer here with worsening shortness of breath and cough. Patient did receive 2 nebulizer treatments prior to ED arrival as well as Solu-Medrol. Patient feeling improved on evaluation the emergency department and did not need recurrent treatments for shortness of breath. History and presentation is not consistent with ACS, PE, pneumonia, congestive heart failure. On repeat evaluation after observation for 2 hours after treatments patient is requesting to go home and feels improved. Treating patient for COPD exacerbation with prednisone burst and see pack given her subjective fevers at home. Discussed close PCP follow-up as well as return precautions.    Quintella Reichert, MD 07/28/14 2045

## 2014-07-29 ENCOUNTER — Other Ambulatory Visit: Payer: Self-pay | Admitting: Pulmonary Disease

## 2014-08-23 ENCOUNTER — Other Ambulatory Visit: Payer: Self-pay | Admitting: Pulmonary Disease

## 2014-09-07 ENCOUNTER — Emergency Department (HOSPITAL_COMMUNITY): Payer: Medicaid Other

## 2014-09-07 ENCOUNTER — Encounter (HOSPITAL_COMMUNITY): Payer: Self-pay | Admitting: Emergency Medicine

## 2014-09-07 ENCOUNTER — Emergency Department (HOSPITAL_COMMUNITY)
Admission: EM | Admit: 2014-09-07 | Discharge: 2014-09-07 | Disposition: A | Discharge status: Home / Self Care | Payer: Medicaid Other | Attending: Emergency Medicine | Admitting: Emergency Medicine

## 2014-09-07 DIAGNOSIS — J441 Chronic obstructive pulmonary disease with (acute) exacerbation: Secondary | ICD-10-CM | POA: Insufficient documentation

## 2014-09-07 DIAGNOSIS — R51 Headache: Secondary | ICD-10-CM | POA: Diagnosis present

## 2014-09-07 DIAGNOSIS — Z87891 Personal history of nicotine dependence: Secondary | ICD-10-CM | POA: Diagnosis not present

## 2014-09-07 DIAGNOSIS — F329 Major depressive disorder, single episode, unspecified: Secondary | ICD-10-CM | POA: Insufficient documentation

## 2014-09-07 DIAGNOSIS — R200 Anesthesia of skin: Secondary | ICD-10-CM | POA: Insufficient documentation

## 2014-09-07 DIAGNOSIS — Z7952 Long term (current) use of systemic steroids: Secondary | ICD-10-CM | POA: Insufficient documentation

## 2014-09-07 DIAGNOSIS — I509 Heart failure, unspecified: Secondary | ICD-10-CM | POA: Insufficient documentation

## 2014-09-07 DIAGNOSIS — R519 Headache, unspecified: Secondary | ICD-10-CM

## 2014-09-07 DIAGNOSIS — Z951 Presence of aortocoronary bypass graft: Secondary | ICD-10-CM | POA: Insufficient documentation

## 2014-09-07 DIAGNOSIS — I10 Essential (primary) hypertension: Secondary | ICD-10-CM | POA: Insufficient documentation

## 2014-09-07 DIAGNOSIS — Z8673 Personal history of transient ischemic attack (TIA), and cerebral infarction without residual deficits: Secondary | ICD-10-CM | POA: Insufficient documentation

## 2014-09-07 DIAGNOSIS — Z85118 Personal history of other malignant neoplasm of bronchus and lung: Secondary | ICD-10-CM | POA: Diagnosis not present

## 2014-09-07 DIAGNOSIS — Z7982 Long term (current) use of aspirin: Secondary | ICD-10-CM | POA: Insufficient documentation

## 2014-09-07 DIAGNOSIS — Z79899 Other long term (current) drug therapy: Secondary | ICD-10-CM | POA: Diagnosis not present

## 2014-09-07 DIAGNOSIS — I2510 Atherosclerotic heart disease of native coronary artery without angina pectoris: Secondary | ICD-10-CM | POA: Diagnosis not present

## 2014-09-07 DIAGNOSIS — F419 Anxiety disorder, unspecified: Secondary | ICD-10-CM | POA: Diagnosis not present

## 2014-09-07 DIAGNOSIS — Z8619 Personal history of other infectious and parasitic diseases: Secondary | ICD-10-CM | POA: Insufficient documentation

## 2014-09-07 DIAGNOSIS — Z923 Personal history of irradiation: Secondary | ICD-10-CM | POA: Insufficient documentation

## 2014-09-07 DIAGNOSIS — Z86018 Personal history of other benign neoplasm: Secondary | ICD-10-CM | POA: Insufficient documentation

## 2014-09-07 DIAGNOSIS — E785 Hyperlipidemia, unspecified: Secondary | ICD-10-CM | POA: Insufficient documentation

## 2014-09-07 DIAGNOSIS — Z7951 Long term (current) use of inhaled steroids: Secondary | ICD-10-CM | POA: Insufficient documentation

## 2014-09-07 DIAGNOSIS — Z8711 Personal history of peptic ulcer disease: Secondary | ICD-10-CM | POA: Insufficient documentation

## 2014-09-07 DIAGNOSIS — R202 Paresthesia of skin: Secondary | ICD-10-CM | POA: Diagnosis not present

## 2014-09-07 DIAGNOSIS — I252 Old myocardial infarction: Secondary | ICD-10-CM | POA: Insufficient documentation

## 2014-09-07 DIAGNOSIS — K219 Gastro-esophageal reflux disease without esophagitis: Secondary | ICD-10-CM | POA: Insufficient documentation

## 2014-09-07 DIAGNOSIS — Z9889 Other specified postprocedural states: Secondary | ICD-10-CM | POA: Insufficient documentation

## 2014-09-07 DIAGNOSIS — R05 Cough: Secondary | ICD-10-CM | POA: Insufficient documentation

## 2014-09-07 DIAGNOSIS — Z9861 Coronary angioplasty status: Secondary | ICD-10-CM | POA: Insufficient documentation

## 2014-09-07 DIAGNOSIS — R110 Nausea: Secondary | ICD-10-CM | POA: Diagnosis not present

## 2014-09-07 MED ORDER — KETOROLAC TROMETHAMINE 30 MG/ML IJ SOLN
30.0000 mg | Freq: Once | INTRAMUSCULAR | Status: AC
  Administered 2014-09-07: 30 mg via INTRAVENOUS

## 2014-09-07 MED ORDER — KETOROLAC TROMETHAMINE 30 MG/ML IJ SOLN
INTRAMUSCULAR | Status: AC
  Filled 2014-09-07: qty 1

## 2014-09-07 MED ORDER — PROCHLORPERAZINE EDISYLATE 5 MG/ML IJ SOLN
10.0000 mg | Freq: Once | INTRAMUSCULAR | Status: AC
  Administered 2014-09-07: 10 mg via INTRAVENOUS
  Filled 2014-09-07: qty 2

## 2014-09-07 MED ORDER — DIPHENHYDRAMINE HCL 50 MG/ML IJ SOLN
25.0000 mg | Freq: Once | INTRAMUSCULAR | Status: AC
  Administered 2014-09-07: 25 mg via INTRAVENOUS
  Filled 2014-09-07: qty 1

## 2014-09-07 MED ORDER — ONDANSETRON HCL 4 MG PO TABS: 4.0000 mg | ORAL_TABLET | Freq: Three times a day (TID) | ORAL | Status: DC | PRN

## 2014-09-07 MED ORDER — SODIUM CHLORIDE 0.9 % IV BOLUS (SEPSIS)
1000.0000 mL | Freq: Once | INTRAVENOUS | Status: AC
  Administered 2014-09-07: 1000 mL via INTRAVENOUS

## 2014-09-07 NOTE — ED Notes (Signed)
To ED via [facility] from home with c/o numbness on right side of face since waking up 5:30, states speech seems slurred also. Had a TIA 1 month ago-- pt is a DNR, end stage COPD--

## 2014-09-07 NOTE — ED Provider Notes (Signed)
CSN: [id number]     Arrival date & time 09/07/14  1430 History   None    Chief Complaint  Patient presents with  . Headache  . Weakness     (Consider location/radiation/quality/duration/timing/severity/associated sxs/prior Treatment) Patient is a 64 y.o. female presenting with headaches. The history is provided by the patient and medical records. No language interpreter was used.  Headache Pain location:  Generalized Quality:  Dull Radiates to:  Does not radiate Severity currently:  8/10 Severity at highest:  8/10 Onset quality:  Gradual Timing:  Intermittent Progression:  Waxing and waning Chronicity:  New Similar to prior headaches: yes   Relieved by:  Nothing Worsened by:  Nothing Ineffective treatments:  None tried Associated symptoms: cough (chronic), nausea and paresthesias   Associated symptoms: no abdominal pain, no blurred vision, no congestion, no diarrhea, no facial pain, no fatigue, no fever, no focal weakness, no loss of balance, no myalgias, no near-syncope, no neck stiffness, no photophobia, no sinus pressure, no URI, no visual change, no vomiting and no weakness     Past Medical History  Diagnosis Date  . Acute myocardial infarction, unspecified site, episode of care unspecified 2011    VF arrest. Ruled in, cath with no clear culprit lesion, inferior WMA on LV gram, medical therapy  . Esophageal dysmotility   . Hemorrhoids   . Esophageal stricture   . Diaphragmatic hernia without mention of obstruction or gangrene   . 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 CAD (coronary artery disease)     CABG'96., Xience DES -VG-RCA (01/12/13)  . Peripheral neuropathy   . Hyperlipidemia   . Peptic ulcer, unspecified site, unspecified as acute or chronic, without mention of hemorrhage, perforation, or obstruction   . Depressive disorder, not elsewhere classified   . Anxiety state, unspecified   . COPD (chronic obstructive pulmonary disease)   . GERD  (gastroesophageal reflux disease)   . Candida esophagitis     on multiple occasions  . C. difficile diarrhea   . PVD (peripheral vascular disease)   . Headache(784.0)   . Weight loss 06/13/2011    scheduled here by family doctor  . CHF (congestive heart failure) 2011  . Asthma   . Hypertension   . History of nuclear stress test 07/14/2010    dipyriadmole; normal pattern of perfusion, low risk   . History of tobacco use     quit 05/2009  . Non-small cell carcinoma of lung, stage 3     Dr. Roxan Hockey  . History of radiation therapy 02/28/2012-04/14/2012    right lung  . Bilateral carotid artery disease   . TIA (transient ischemic attack)     amaurosis fugax  . Tubular adenoma of colon    Past Surgical History  Procedure Laterality Date  . Tonsillectomy  1974  . Fracture lt jaw  1976  . Breast surgery  1969, 1970    bilateral  . Epigastric hernia repair  2008    Dr Roxan Hockey  . Coronary artery bypass graft  02/01/1994    left internal mammary to LAD, vein graft to diagonal/post descending, post lateral, marginals, circumfle, diagonal  . Carpal tunnel release    . Total abdominal hysterectomy  1998  . Laminectomy    . Subclavian vein angioplasty / stenting Left   . Transthoracic echocardiogram  08/14/2009    EF=>55%, normal LV systolic function, mild hypokinesis of basal segments of inferolateral & anterolater walls; mild MR/TR; AV mildly sclerotic  . Coronary angioplasty  with stent placement  08/29/2000    stent to RCA (Dr. Corky Downs)  . Cardiac catheterization  01/17/2002    patent grafts (Dr. Marella Chimes)  . Cardiac catheterization  02/13/2004    significant L main & 3 vessel CAD, patents grafts, mild distal aortic disease (Dr. Gerrie Nordmann)  . Cardiac catheterization  07/14/2005    patent grafts, normal LV function (Dr. Adora Fridge)  . Cardiac catheterization  09/26/2008    patent grafts, normal LV function (Dr. Adora Fridge)  . Cardiac catheterization  05/31/2009    patent grafts,  inferior wall motion abnormality (Dr. Adora Fridge)  . Cardiac catheterization  10/03/2009    normal LV function; 100% native RCA, circumflex, distal LAD occlusion  . Coronary angioplasty with stent placement  01/12/2013    Xience DES to SVG-RCA distal  . Carotid doppler  08/14/2009    R & L ICAs - 0-49% diameter reduction   . Left heart catheterization with coronary/graft angiogram N/A 01/12/2013    Procedure: LEFT HEART CATHETERIZATION WITH Beatrix Fetters;  Surgeon: Leonie Man, MD;  Location: [facility] CATH LAB;  Service: Cardiovascular;  Laterality: N/A;   Family History  Problem Relation Age of Onset  . Lung cancer Father     also CVA  . Heart disease Father   . Asthma Father   . Allergies Father   . Cancer Father   . Heart disease Mother     also CVA, MI  . Brain cancer Mother   . Heart disease Sister   . Colon cancer Neg Hx   . Lung cancer Brother 70    also ENT cancer  . Hypertension Brother 90    also aneurysm, early CAD  . Breast cancer Other     family hx   History  Substance Use Topics  . Smoking status: Former Smoker -- 1.50 packs/day for 30 years    Types: Cigarettes    Quit date: 05/24/2013  . Smokeless tobacco: Never Used  . Alcohol Use: No   OB History    No data available     Review of Systems  Constitutional: Negative for fever and fatigue.  HENT: Negative for congestion and sinus pressure.   Eyes: Negative for blurred vision and photophobia.  Respiratory: Positive for cough (chronic) and shortness of breath (chronic). Negative for chest tightness.   Cardiovascular: Negative for chest pain, palpitations and near-syncope.  Gastrointestinal: Positive for nausea. Negative for vomiting, abdominal pain and diarrhea.  Musculoskeletal: Negative for myalgias and neck stiffness.  Neurological: Positive for headaches and paresthesias. Negative for focal weakness, weakness, light-headedness and loss of balance.  Psychiatric/Behavioral: Negative for confusion.   All other systems reviewed and are negative.   Allergies  Amiodarone; Clopidogrel bisulfate; Dilaudid; and Multaq  Home Medications   Prior to Admission medications   Medication Sig Start Date End Date Taking? Authorizing Provider  acetaminophen (TYLENOL) 500 MG tablet Take 1,000 mg by mouth every 6 (six) hours as needed (pain).   Yes Historical Provider, MD  acyclovir ointment (ZOVIRAX) 5 % Apply 1 application topically every 3 (three) hours as needed (fever blisters).  07/31/14  Yes Historical Provider, MD  albuterol (PROVENTIL HFA;VENTOLIN HFA) 108 (90 BASE) MCG/ACT inhaler Inhale 2 puffs into the lungs every 6 (six) hours as needed for wheezing or shortness of breath. 07/02/14  Yes Chesley Mires, MD  aspirin 81 MG EC tablet Take 81 mg by mouth daily.     Yes Historical Provider, MD  atorvastatin (LIPITOR) 80  MG tablet Take 80 mg by mouth at bedtime.   Yes Historical Provider, MD  Cholecalciferol (VITAMIN D-3) 5000 UNITS TABS Take 5,000 Units by mouth daily.    Yes Historical Provider, MD  diazepam (VALIUM) 5 MG tablet Take 5 mg by mouth 2 (two) times daily.   Yes Historical Provider, MD  EFFIENT 10 MG TABS tablet TAKE 1 TABLET BY MOUTH EVERY DAY 03/18/14  Yes Lorretta Harp, MD  famotidine (PEPCID) 20 MG tablet Take 1 tablet (20 mg total) by mouth at bedtime. 07/15/14  Yes Noralee Space, MD  feeding supplement, ENSURE COMPLETE, (ENSURE COMPLETE) LIQD Take 237 mLs by mouth 3 (three) times daily between meals. 06/26/14  Yes Debbe Odea, MD  fluticasone (FLONASE) 50 MCG/ACT nasal spray Place 1 spray into both nostrils daily as needed for allergies or rhinitis. 03/05/14  Yes Ripudeep Krystal Eaton, MD  Fluticasone Furoate-Vilanterol (BREO ELLIPTA) 100-25 MCG/INH AEPB Inhale 1 puff into the lungs daily. 05/22/14  Yes Chesley Mires, MD  guaiFENesin (MUCINEX) 600 MG 12 hr tablet Take 1 tablet (600 mg total) by mouth 2 (two) times daily. 06/26/14  Yes Debbe Odea, MD  HYDROcodone-acetaminophen (NORCO) 10-325 MG  per tablet Take 1 tablet by mouth every 4 (four) hours as needed for moderate pain.    Yes Historical Provider, MD  loratadine (CLARITIN) 10 MG tablet Take 1 tablet (10 mg total) by mouth daily as needed for allergies. 03/05/14  Yes Ripudeep K Rai, MD  montelukast (SINGULAIR) 10 MG tablet Take 10 mg by mouth at bedtime.   Yes Historical Provider, MD  nitroGLYCERIN (NITROSTAT) 0.4 MG SL tablet Place 0.4 mg under the tongue every 5 (five) minutes as needed for chest pain.    Yes Historical Provider, MD  nystatin (MYCOSTATIN) 100000 UNIT/ML suspension Use as directed 5 mLs in the mouth or throat 4 (four) times daily.  07/22/14  Yes Historical Provider, MD  omeprazole (PRILOSEC) 20 MG capsule Take 20 mg by mouth 2 (two) times daily before a meal.   Yes Historical Provider, MD  SYMBICORT 160-4.5 MCG/ACT inhaler INHALE 2 PUFFS INTO THE LUNGS TWICE A DAY THEN RINSE AFTER DOSING 08/23/14  Yes Chesley Mires, MD  tiotropium (SPIRIVA) 18 MCG inhalation capsule Place 1 capsule (18 mcg total) into inhaler and inhale daily. 05/15/14  Yes Noralee Space, MD  ZETIA 10 MG tablet TAKE 1 TABLET BY MOUTH EVERY DAY 03/25/14  Yes Lorretta Harp, MD  Alum & Mag Hydroxide-Simeth (MAGIC MOUTHWASH) SOLN Take 5 mLs by mouth 4 (four) times daily. Patient not taking: Reported on 09/07/2014 07/15/14   Chesley Mires, MD  azithromycin (ZITHROMAX Z-PAK) 250 MG tablet Take 1 tablet (250 mg total) by mouth daily. Take two tablets by mouth the first day.  Followed by one tablet by mouth for days 2-5. Patient not taking: Reported on 09/07/2014 07/28/14   Quintella Reichert, MD  OXYGEN-HELIUM IN Inhale 4 L into the lungs continuous.    Historical Provider, MD  predniSONE (DELTASONE) 10 MG tablet Take 4 tablets (40 mg total) by mouth daily. Patient not taking: Reported on 09/07/2014 07/28/14   Quintella Reichert, MD  PROAIR HFA 108 (90 BASE) MCG/ACT inhaler INHALE 2 PUFFS INTO THE LUNGS EVERY 6 HOURS AS NEEDED FOR WHEEZING. Patient not taking: Reported on  09/07/2014 07/09/14   Chesley Mires, MD   BP 135/62 mmHg  Pulse 75  Temp(Src) 98.8 F (37.1 C) (Oral)  Resp 14  Wt 89 lb (40.37 kg)  SpO2 100% Physical  Exam  Constitutional: She is oriented to person, place, and time. She appears well-developed and well-nourished. She appears cachectic. She appears ill. No distress. Nasal cannula in place.  Very frail female who looks older than her age  HENT:  Head: Normocephalic and atraumatic.  Nose: Nose normal.  Mouth/Throat: Oropharynx is clear and moist. No oropharyngeal exudate.  Eyes: EOM are normal. Pupils are equal, round, and reactive to light.  Neck: Normal range of motion. Neck supple.  Cardiovascular: Normal rate, regular rhythm, normal heart sounds and intact distal pulses.   No murmur heard. Pulmonary/Chest: Effort normal. No respiratory distress. She has no wheezes. She exhibits no tenderness.  O2 by Speed at 4L.  Coarse breath sounds.  Normal work of breathing, no retractions   Abdominal: Soft. There is no tenderness. There is no rebound and no guarding.  Musculoskeletal: Normal range of motion. She exhibits no tenderness.  Lymphadenopathy:    She has no cervical adenopathy.  Neurological: She is alert and oriented to person, place, and time. No cranial nerve deficit. Coordination normal.  Full strength and sensation of all extremities.  Normal coordination (finger to nose).  No facial droop, no dysarthria.  Sensation intact on face but light touch causes paresthesias at right cheek.    Skin: Skin is warm and dry. She is not diaphoretic.  Psychiatric: She has a normal mood and affect. Her behavior is normal. Judgment and thought content normal.  Nursing note and vitals reviewed.   ED Course  Procedures (including critical care time) Labs Review Labs Reviewed - No data to display  Imaging Review Ct Head Wo Contrast  09/07/2014   CLINICAL DATA:  Headache and weakness since waking up this morning. Numbness on the right side of his  face. Slurred speech. History of lung cancer.  EXAM: CT HEAD WITHOUT CONTRAST  TECHNIQUE: Contiguous axial images were obtained from the base of the skull through the vertex without intravenous contrast.  COMPARISON:  MRI head 03/04/2014  FINDINGS: The ventricles are normal in size and configuration. No extra-axial fluid collections are identified. The gray-white differentiation is normal. No CT findings for acute intracranial process such as hemorrhage or infarction. No mass lesions. The brainstem and cerebellum are grossly normal. Stable intracranial vascular calcifications  The bony structures are intact. The paranasal sinuses and mastoid air cells are clear. The globes are intact.  IMPRESSION: No acute intracranial findings or evidence of intracranial metastatic disease.   Electronically Signed   By: Marijo Sanes M.D.   On: 09/07/2014 15:55     EKG Interpretation None      MDM   Final diagnoses:  Throbbing headache  History of lung cancer  Numbness and tingling of right face  Nausea   Pt is a 64 yo F with hx of COPD (on 4L O2 by [place]), nonsmall cell lung cancer (s/p chemo, finished 2 years ago, f/u 4 months ago was benign), CAD s/p CABG, hx of Vfib arrest, CHF, HTN, and TIAs who presents with recurrent headaches and nausea.  Has had a diffuse throbbing headache most days this week.  Gradual onset.  t is associated with generalized fatigue and nausea.  Mild right facial paresthesias with the headaches but denies continued numbness when the headache is not active.  Has had a hx of headaches in the past that felt somewhat similar, but not this regularly. Denies chest pain, SOB, abd pain, change in BMs, dysuria, fevers.  No recent illnesses or medication changes.  Hx of cancer and  has been in remission for 2 years.  Gets check ups every 6 months, most recently 4 months ago and was still negative.  No hx of brain mets in the past.    Appears cachectic but not acutely toxic.  Vitals stable on her  baseline 4L O2 by [place].   Nonfocal neuro exam.   Family denies that she has any facial droop or voice changes.    Will give a migraine cocktail for symptomatic relief.  Based on hx of cancer and recurrent headaches, will get a CT head to rule out mets.      Pt's pain improved significantly with the pain meds.  Paresthesia on right face also resolved with headache pain relief.   CT scan benign, pt informed.   Ok to go home.  Will discharge home with Rx for zofran.  Advised to follow up with PCP next week for re-evaluation.     Patient was seen with ED Attending, Dr. Joette Catching, MD   Tori Milks, MD 09/08/14 [id number]  Virgel Manifold, MD 09/10/14 [phone]

## 2014-09-17 ENCOUNTER — Ambulatory Visit (INDEPENDENT_AMBULATORY_CARE_PROVIDER_SITE_OTHER): Payer: Medicaid Other | Admitting: Pulmonary Disease

## 2014-09-17 ENCOUNTER — Encounter: Payer: Self-pay | Admitting: Pulmonary Disease

## 2014-09-17 VITALS — BP 106/70 | HR 86 | Temp 97.7°F | Ht 66.0 in | Wt 92.2 lb

## 2014-09-17 DIAGNOSIS — J432 Centrilobular emphysema: Secondary | ICD-10-CM | POA: Diagnosis not present

## 2014-09-17 DIAGNOSIS — J9611 Chronic respiratory failure with hypoxia: Secondary | ICD-10-CM | POA: Diagnosis not present

## 2014-09-17 MED ORDER — ALBUTEROL SULFATE (2.5 MG/3ML) 0.083% IN NEBU: 2.5000 mg | INHALATION_SOLUTION | Freq: Four times a day (QID) | RESPIRATORY_TRACT | Status: DC | PRN

## 2014-09-17 NOTE — Progress Notes (Signed)
Chief Complaint  Patient presents with  . Follow-up    Pt reports that SOB and mucus production has been increased since last OV. Pt reports being in the hospital about 2-3 times since last OV.     History of Present Illness: [name] is a 64 y.o. female former smoker with COPD and chronic hypoxic respiratory failure.  She has hx of Stage IIIA NSCLC s/p chemoradiation therapy.  She was in hospital in 28-Jul-2022 for AECOPD.  She had f/u with oncology in December, and was advised to continue clinical monitoring of spiculated lesion in RUL.  She was in ER for headaches earlier this month.  She has appt with neurology.  She has been feeling depressed after her son died from a heroin OD in 28-Jul-2022.  She is not having much cough or sputum.  She denies chest pain.  She has been using Breo and symbicort, but feels symbicort works better.    She has been taking nystatin for thrush.  She is using 4 liters oxygen 24/7.  TESTS: PFT 06/15/10 >> FEV1 1.54 (61%), FEV1% 55, TLC 5.07 (95%), DLCO 62%, no BD A1AT 10/27/11 >> 175 (nl 83 - 199), MM Echo 06/24/14 >> EF 55 to 60%  PMHx >> CAD, HLD, HTN, Esophageal dysmotility, GERD, Neuropathy, Depression, Anxiety   PSHx, Medications, Allergies, Fhx, Shx reviewed.  Physical Exam: Blood pressure 106/70, pulse 86, temperature 97.7 F (36.5 C), temperature source Oral, height '5\' 6"'$  (1.676 m), weight 92 lb 3.2 oz (41.822 kg), SpO2 99 %. Body mass index is 14.89 kg/(m^2).  General - No distress, thin, wearing oxygen ENT - No sinus tenderness,no oral exudate, no LAN Cardiac - s1s2 regular, no murmur Chest - Prolonged exhalation, no wheeze/rales/dullness Back - No focal tenderness Abd - Soft, non-tender Ext - No edema Neuro - Normal strength Skin - No rashes Psych - normal mood, and behavior   Assessment/Plan:  COPD with emphysema. Plan: - continue spiriva, symbicort - d/c breo - will arrange for home nebulizer with albuterol - she  might need to change symbicort to pulmicort/brovana nebulizer if her thrush is not better controlled  Chronic hypoxic respiratory failure Plan: - continue 4 liters oxygen 24/7  Allergic rhinitis. Plan: - continue flonase, singulair, and prn claritin per PCP  Headache. Plan: - she has appt with neurology  NSCLC. CT chest shows spiculated nodule RUL. Plan: - she has f/u CT scheduled with oncology   Whitney Mires, MD [place] Pager:  [phone]

## 2014-09-17 NOTE — Patient Instructions (Signed)
Stop breo Symbicort two puffs twice per day Spiriva one puff daily Can use albuterol by puffer or nebulizer every 4 to 6 hours as needed Will arrange for home nebulizer machine Follow up in 6 months

## 2014-09-18 ENCOUNTER — Other Ambulatory Visit: Payer: Self-pay | Admitting: Pulmonary Disease

## 2014-09-24 ENCOUNTER — Encounter: Payer: Self-pay | Admitting: Neurology

## 2014-09-24 ENCOUNTER — Ambulatory Visit (INDEPENDENT_AMBULATORY_CARE_PROVIDER_SITE_OTHER): Payer: Medicaid Other | Admitting: Neurology

## 2014-09-24 VITALS — BP 108/56 | HR 68 | Resp 22 | Ht 66.0 in | Wt 94.0 lb

## 2014-09-24 DIAGNOSIS — M542 Cervicalgia: Secondary | ICD-10-CM

## 2014-09-24 DIAGNOSIS — I6523 Occlusion and stenosis of bilateral carotid arteries: Secondary | ICD-10-CM | POA: Diagnosis not present

## 2014-09-24 DIAGNOSIS — R51 Headache: Secondary | ICD-10-CM | POA: Diagnosis not present

## 2014-09-24 DIAGNOSIS — F411 Generalized anxiety disorder: Secondary | ICD-10-CM | POA: Diagnosis not present

## 2014-09-24 DIAGNOSIS — R519 Headache, unspecified: Secondary | ICD-10-CM

## 2014-09-24 DIAGNOSIS — I6529 Occlusion and stenosis of unspecified carotid artery: Secondary | ICD-10-CM | POA: Insufficient documentation

## 2014-09-24 MED ORDER — NORTRIPTYLINE HCL 25 MG PO CAPS: 25.0000 mg | ORAL_CAPSULE | Freq: Every day | ORAL | Status: DC

## 2014-09-24 NOTE — Progress Notes (Signed)
[facility]  PATIENT: [name] DOB: 1950/10/13  REFERRING DOCTOR OR PCP:  [place] SOURCE: patient, records from Dr. Dagmar Hait and records form hospital and MRI/CT imges  _________________________________   HISTORICAL  CHIEF COMPLAINT:  Chief Complaint  Patient presents with  . Headaches    PMH: 6 mi's, cabg, cardiac cath with stent placements, bilat carotid artery stenosis, cva.  Sts. h/a's started after her husband passed away in 13-Nov-2013 (lung ca, cva), and continued to worsen after her son passed away in Jul 16, 2014 (heroin od).  Sts. h/a's are about 4 times weekly.  She wakes up with them and they last all day.  Assoc. with nausea but no light/sound sensitivity.  Minimal relief with otc meds. She takes Hydrocodone for back pain but sts. it doesn't help h/a's.  Sts. in March 2016 she  . Cerebrovascular Accident    went to the ED for difficulty breathing, assoc. with her lung cancer.  While there, she sts. she had a ct brain that showed a stroke.  She was already on Effient for heart problems, so no additionl blood thinner was added./fim    HISTORY OF PRESENT ILLNESS:  I had the pleasure seeing you patient, [name], at [facility] for neurologic consultation regarding her headaches over the past month and episode of right facial numbness, slurred speech on 09/07/2014.     She has headaches located all over her head with a throbbing sensation.  Pain came on last month without any trigger and was intense at the onset.    She had headaches like this in the past and used to see Dr. Erling Cruz here at [facility].    She denies any change in vision or other neurologic symptoms.   Nothing has helped the pain.  She took hydrocodone and it only helps a little bit temporarily.      Two weeks ago she woke up and headache was very intense.   The right side of the face was numb .  She had slurred speech.   She went to the ER.    CT was normal and she was  discharged home    In October 2015, she presented to the emergency room with headache and a 5-10 minute episode of right visual amaurosis fugax.  MRI of the brain was essentially normal though very reduced flow was noted in the right vertebral artery. MRA showed no definite flow in that artery.   CT angiogram showed 60% right and 64% left internal carotid artery stenosis.   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 Although surgery was considered, this was deferred secondary to her multiple comorbidities including CAD with an MI in 1995 and CABG in 1995, recurrent stenting (cardiac and peripheral including left subclavian) and lung cancer.     I personally reviewed the MRI, MRA and CT angiogram performed 03/05/2015 and 03/05/2014 and concur with the radiologic interpretation.  Vascular risk factors include history of smoking. She stopped in 1995.  Also has h/o cancer  She also has tingling pain in her right foot since a saphenous vein bypass and it worsened after disc surgery  REVIEW OF SYSTEMS: Constitutional: No fevers, chills, sweats, or change in appetite Eyes: No visual changes, double vision, eye pain Ear, nose and throat: No hearing loss, ear pain, nasal congestion, sore throat Cardiovascular: H/o MI.   No chest pain, palpitations Respiratory: shortness of breath  with exertion. On O2 GastrointestinaI: No  nausea, vomiting, diarrhea, abdominal pain, fecal incontinence Genitourinary: No dysuria, urinary retention or frequency.  No nocturia. Musculoskeletal: reports neck pain, back pain Integumentary: No rash, pruritus, skin lesions Neurological: as above Psychiatric: Some depression and anxiety Endocrine: No palpitations, diaphoresis, change in appetite, change in weigh or increased thirst Hematologic/Lymphatic: No anemia, purpura, petechiae. Allergic/Immunologic: No itchy/runny eyes, nasal congestion, recent allergic reactions,  rashes  ALLERGIES: Allergies  Allergen Reactions  . Amiodarone Other (See Comments)    Drops HR too low, SOB, fatigue  . Clopidogrel Bisulfate Hives  . Dilaudid [Hydromorphone Hcl] Other (See Comments)    "I couldn't function for 2 days."  . Multaq [Dronedarone] Other (See Comments)    Heart races     HOME MEDICATIONS:  Current outpatient prescriptions:  .  acyclovir ointment (ZOVIRAX) 5 %, Apply 1 application topically every 3 (three) hours as needed (fever blisters). , Disp: , Rfl: 1 .  albuterol (PROVENTIL HFA;VENTOLIN HFA) 108 (90 BASE) MCG/ACT inhaler, Inhale 2 puffs into the lungs every 6 (six) hours as needed for wheezing or shortness of breath., Disp: 18 g, Rfl: 3 .  albuterol (PROVENTIL) (2.5 MG/3ML) 0.083% nebulizer solution, Take 3 mLs (2.5 mg total) by nebulization every 6 (six) hours as needed for wheezing or shortness of breath., Disp: 120 vial, Rfl: 5 .  aspirin 81 MG EC tablet, Take 81 mg by mouth daily.  , Disp: , Rfl:  .  atorvastatin (LIPITOR) 80 MG tablet, Take 80 mg by mouth at bedtime., Disp: , Rfl:  .  Cholecalciferol (VITAMIN D-3) 5000 UNITS TABS, Take 5,000 Units by mouth daily. , Disp: , Rfl:  .  diazepam (VALIUM) 5 MG tablet, Take 5 mg by mouth 2 (two) times daily., Disp: , Rfl:  .  EFFIENT 10 MG TABS tablet, TAKE 1 TABLET BY MOUTH EVERY DAY, Disp: 30 tablet, Rfl: 5 .  famotidine (PEPCID) 20 MG tablet, Take 1 tablet (20 mg total) by mouth at bedtime., Disp: 30 tablet, Rfl: 1 .  feeding supplement, ENSURE COMPLETE, (ENSURE COMPLETE) LIQD, Take 237 mLs by mouth 3 (three) times daily between meals., Disp: 90 Bottle, Rfl: 0 .  fluticasone (FLONASE) 50 MCG/ACT nasal spray, Place 1 spray into both nostrils daily as needed for allergies or rhinitis., Disp: 16 g, Rfl: 2 .  guaiFENesin (MUCINEX) 600 MG 12 hr tablet, Take 1 tablet (600 mg total) by mouth 2 (two) times daily., Disp: 60 tablet, Rfl: 0 .  HYDROcodone-acetaminophen (NORCO) 10-325 MG per tablet, Take 1  tablet by mouth every 4 (four) hours as needed for moderate pain. , Disp: , Rfl:  .  loratadine (CLARITIN) 10 MG tablet, Take 1 tablet (10 mg total) by mouth daily as needed for allergies., Disp: 30 tablet, Rfl: 3 .  montelukast (SINGULAIR) 10 MG tablet, Take 10 mg by mouth at bedtime., Disp: , Rfl:  .  nitroGLYCERIN (NITROSTAT) 0.4 MG SL tablet, Place 0.4 mg under the tongue every 5 (five) minutes as needed for chest pain. , Disp: , Rfl:  .  nystatin (MYCOSTATIN) 100000 UNIT/ML suspension, Use as directed 5 mLs in the mouth or throat 4 (four) times daily. , Disp: , Rfl: 2 .  omeprazole (PRILOSEC) 20 MG capsule, Take 20 mg by mouth 2 (two) times daily before a meal., Disp: , Rfl:  .  ondansetron (ZOFRAN) 4 MG tablet, Take 1 tablet (4 mg total) by mouth every 8 (eight) hours as needed for nausea or vomiting., Disp: 12 tablet, Rfl: 0 .  OXYGEN-HELIUM IN,  Inhale 4 L into the lungs continuous., Disp: , Rfl:  .  PROAIR HFA 108 (90 BASE) MCG/ACT inhaler, INHALE 2 PUFFS INTO THE LUNGS EVERY 6 HOURS AS NEEDED FOR WHEEZING., Disp: 8.5 Inhaler, Rfl: 5 .  SYMBICORT 160-4.5 MCG/ACT inhaler, INHALE 2 PUFFS INTO THE LUNGS TWICE A DAY THEN RINSE AFTER DOSING, Disp: 10.2 Inhaler, Rfl: 6 .  tiotropium (SPIRIVA) 18 MCG inhalation capsule, Place 1 capsule (18 mcg total) into inhaler and inhale daily., Disp: 30 capsule, Rfl: 6 .  ZETIA 10 MG tablet, TAKE 1 TABLET BY MOUTH EVERY DAY, Disp: 30 tablet, Rfl: 11 .  montelukast (SINGULAIR) 10 MG tablet, TAKE 1 TABLET BY MOUTH EVERY EVENING TO PREVENT COUGH OR WHEEZING (Patient not taking: Reported on 09/24/2014), Disp: 30 tablet, Rfl: 5 .  Oxycodone HCl 10 MG TABS, , Disp: , Rfl: 0 .  predniSONE (DELTASONE) 20 MG tablet, , Disp: , Rfl: 0  PAST MEDICAL HISTORY: Past Medical History  Diagnosis Date  . Acute myocardial infarction, unspecified site, episode of care unspecified 2011    VF arrest. Ruled in, cath with no clear culprit lesion, inferior WMA on LV gram, medical  therapy  . Esophageal dysmotility   . Hemorrhoids   . Esophageal stricture   . Diaphragmatic hernia without mention of obstruction or gangrene   . 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 CAD (coronary artery disease)     CABG'96., Xience DES -VG-RCA (01/12/13)  . Peripheral neuropathy   . Hyperlipidemia   . Peptic ulcer, unspecified site, unspecified as acute or chronic, without mention of hemorrhage, perforation, or obstruction   . Depressive disorder, not elsewhere classified   . Anxiety state, unspecified   . COPD (chronic obstructive pulmonary disease)   . GERD (gastroesophageal reflux disease)   . Candida esophagitis     on multiple occasions  . C. difficile diarrhea   . PVD (peripheral vascular disease)   . Headache(784.0)   . Weight loss 06/13/2011    scheduled here by family doctor  . CHF (congestive heart failure) 2011  . Asthma   . Hypertension   . History of nuclear stress test 07/14/2010    dipyriadmole; normal pattern of perfusion, low risk   . History of tobacco use     quit 05/2009  . Non-small cell carcinoma of lung, stage 3     Dr. Roxan Hockey  . History of radiation therapy 02/28/2012-04/14/2012    right lung  . Bilateral carotid artery disease   . TIA (transient ischemic attack)     amaurosis fugax  . Tubular adenoma of colon     PAST SURGICAL HISTORY: Past Surgical History  Procedure Laterality Date  . Tonsillectomy  1974  . Fracture lt jaw  1976  . Breast surgery  1969, 1970    bilateral  . Epigastric hernia repair  2008    Dr Roxan Hockey  . Coronary artery bypass graft  02/01/1994    left internal mammary to LAD, vein graft to diagonal/post descending, post lateral, marginals, circumfle, diagonal  . Carpal tunnel release    . Total abdominal hysterectomy  1998  . Laminectomy    . Subclavian vein angioplasty / stenting Left   . Transthoracic echocardiogram  08/14/2009    EF=>55%, normal LV systolic function, mild hypokinesis of basal  segments of inferolateral & anterolater walls; mild MR/TR; AV mildly sclerotic  . Coronary angioplasty with stent placement  08/29/2000    stent to RCA (Dr. Corky Downs)  . Cardiac  catheterization  01/17/2002    patent grafts (Dr. Marella Chimes)  . Cardiac catheterization  02/13/2004    significant L main & 3 vessel CAD, patents grafts, mild distal aortic disease (Dr. Gerrie Nordmann)  . Cardiac catheterization  07/14/2005    patent grafts, normal LV function (Dr. Adora Fridge)  . Cardiac catheterization  09/26/2008    patent grafts, normal LV function (Dr. Adora Fridge)  . Cardiac catheterization  05/31/2009    patent grafts, inferior wall motion abnormality (Dr. Adora Fridge)  . Cardiac catheterization  10/03/2009    normal LV function; 100% native RCA, circumflex, distal LAD occlusion  . Coronary angioplasty with stent placement  01/12/2013    Xience DES to SVG-RCA distal  . Carotid doppler  08/14/2009    R & L ICAs - 0-49% diameter reduction   . Left heart catheterization with coronary/graft angiogram N/A 01/12/2013    Procedure: LEFT HEART CATHETERIZATION WITH Beatrix Fetters;  Surgeon: Leonie Man, MD;  Location: [facility] CATH LAB;  Service: Cardiovascular;  Laterality: N/A;    FAMILY HISTORY: Family History  Problem Relation Age of Onset  . Lung cancer Father     also CVA  . Heart disease Father   . Asthma Father   . Allergies Father   . Cancer Father   . Heart disease Mother     also CVA, MI  . Brain cancer Mother   . Heart disease Sister   . Colon cancer Neg Hx   . Lung cancer Brother 10    also ENT cancer  . Hypertension Brother 62    also aneurysm, early CAD  . Breast cancer Other     family hx    SOCIAL HISTORY:  History   Social History  . Marital Status: Married    Spouse Name: N/A  . Number of Children: 2  . Years of Education: N/A   Occupational History  . disabled     back problems   Social History Main Topics  . Smoking status: Former Smoker -- 1.50 packs/day for 30  years    Types: Cigarettes    Quit date: 05/24/2013  . Smokeless tobacco: Never Used  . Alcohol Use: No  . Drug Use: No  . Sexual Activity: Not on file   Other Topics Concern  . Not on file   Social History Narrative   Married with 2 children, husband has Alzheimer's dementia, retired now, previously Corporate treasurer, Quit smoking in [place]:   09/24/14 1119  BP: 108/56  Pulse: 68  Resp: 22  Height: '5\' 6"'$  (1.676 m)  Weight: 94 lb (42.638 kg)    Body mass index is 15.18 kg/(m^2).   General: The patient is a cachetic woman in no acute distress  Eyes:  Funduscopic exam shows normal optic discs and retinal vessels.  Neck: The neck is supple, R > L carotid bruits are noted.  The neck is tender at occiput and in mid cervical paraspinal muscles  Cardiovascular: The heart has an irregular rate and rhythm with a normal S1 and S2. There were no murmurs, gallops or rubs.   Skin: Extremities are without significant edema.  Musculoskeletal:  Back is nontender  Neurologic Exam  Mental status: The patient is alert and oriented x 3 at the time of the examination. The patient has apparent normal recent and remote memory, with an apparently normal attention span and concentration ability.   Speech is  normal.  Cranial nerves: Extraocular movements are full. Pupils are equal, round, and reactive to light and accomodation.  Visual fields are full.  Facial symmetry is present. There is good facial sensation to soft touch bilaterally.Facial strength is normal.  Trapezius and sternocleidomastoid strength is normal. No dysarthria is noted.  The tongue is midline, and the patient has symmetric elevation of the soft palate. No obvious hearing deficits are noted.  Motor:  Muscle bulk is normal.   Tone is normal. Strength is  5 / 5 in all 4 extremities.   Sensory: Sensory testing is intact to pinprick, soft touch and vibration in arms and she has reduced  .  Coordination: Cerebellar testing reveals good finger-nose-finger and heel-to-shin bilaterally.  Gait and station: Station is normal.   Gait is normal. Tandem gait is normal. Romberg is negative.   Reflexes: Deep tendon reflexes are symmetric and normal bilaterally.   Plantar responses are flexor.    DIAGNOSTIC DATA (LABS, IMAGING, TESTING) - I reviewed patient records, labs, notes, testing and imaging myself where available.  Lab Results  Component Value Date   WBC 6.6 07/28/2014   HGB 13.2 07/28/2014   HCT 39.6 07/28/2014   MCV 95.2 07/28/2014   PLT 222 07/28/2014      Component Value Date/Time   NA 142 07/28/2014 1636   NA 142 05/08/2014 1058   K 3.3* 07/28/2014 1636   K 3.9 05/08/2014 1058   CL 104 07/28/2014 1636   CL 104 11/07/2012 0839   CO2 34* 07/28/2014 1636   CO2 28 05/08/2014 1058   GLUCOSE 103* 07/28/2014 1636   GLUCOSE 94 05/08/2014 1058   GLUCOSE 92 11/07/2012 0839   BUN 7 07/28/2014 1636   BUN 9.6 05/08/2014 1058   CREATININE 0.81 07/28/2014 1636   CREATININE 0.7 05/08/2014 1058   CALCIUM 9.2 07/28/2014 1636   CALCIUM 9.3 05/08/2014 1058   PROT 5.5* 06/26/2014 0535   PROT 6.5 05/08/2014 1058   ALBUMIN 3.1* 06/26/2014 0535   ALBUMIN 3.8 05/08/2014 1058   AST 28 06/26/2014 0535   AST 15 05/08/2014 1058   ALT 38* 06/26/2014 0535   ALT 16 05/08/2014 1058   ALKPHOS 79 06/26/2014 0535   ALKPHOS 93 05/08/2014 1058   BILITOT 0.3 06/26/2014 0535   BILITOT 0.41 05/08/2014 1058   GFRNONAA 77* 07/28/2014 1636   GFRAA 89* 07/28/2014 1636   Lab Results  Component Value Date   CHOL 233* 03/04/2014   HDL 84 03/04/2014   LDLCALC 138* 03/04/2014   TRIG 57 03/04/2014   CHOLHDL 2.8 03/04/2014   Lab Results  Component Value Date   HGBA1C 5.4 03/04/2014       ASSESSMENT AND PLAN  Headache disorder - Plan: Sedimentation rate  Neck pain  Anxiety state - Plan: Sedimentation rate  Carotid stenosis, bilateral - Plan: Sedimentation rate    In  summary, [name] is a 64 year old woman with a headache disorder who also has had one or 2 TIAs, likely from her carotid stenosis, over the last 8 months. She is tender in the occiput and some of the paraspinal muscles. I would do a trigger point injection with 80 mg of depot Medrol and Marcaine into the splenius capitis muscles bilaterally at the C5-C6 paraspinal muscles bilaterally. Additionally, I added nortriptyline 25 mg by mouth daily at bedtime. If she is not better I will consider imaging her cervical spine due to her cancer history. I also checked a sedimentation rate to assess for temporal arteritis.  She will return to see me in 2 months or sooner if she has new worsening neurologic symptoms.  Arnez Stoneking A. Felecia Shelling, MD, PhD 0/06/7251, 66:44 AM Certified in Neurology, Clinical Neurophysiology, Sleep Medicine, Pain Medicine and Neuroimaging  [facility] [address], [place], [place] [address] [phone]

## 2014-09-25 ENCOUNTER — Telehealth: Payer: Self-pay | Admitting: *Deleted

## 2014-09-25 LAB — SEDIMENTATION RATE: Sed Rate: 7 mm/hr (ref 0–40)

## 2014-09-25 NOTE — Telephone Encounter (Signed)
-----   Message from Britt Bottom, MD sent at 09/25/2014 11:04 AM EDT ----- Please let her know lab test was fine

## 2014-09-25 NOTE — Telephone Encounter (Signed)
I have spoken with [name] and per RAS, advised that sed rate was normal.  She verbalized understanding of same/fim

## 2014-09-27 ENCOUNTER — Emergency Department (HOSPITAL_COMMUNITY): Payer: Medicaid Other

## 2014-09-27 ENCOUNTER — Encounter (HOSPITAL_COMMUNITY): Payer: Self-pay | Admitting: Emergency Medicine

## 2014-09-27 ENCOUNTER — Emergency Department (HOSPITAL_COMMUNITY)
Admission: EM | Admit: 2014-09-27 | Discharge: 2014-09-27 | Disposition: A | Discharge status: Home / Self Care | Payer: Medicaid Other | Attending: Emergency Medicine | Admitting: Emergency Medicine

## 2014-09-27 DIAGNOSIS — Z7982 Long term (current) use of aspirin: Secondary | ICD-10-CM | POA: Diagnosis not present

## 2014-09-27 DIAGNOSIS — R05 Cough: Secondary | ICD-10-CM | POA: Diagnosis not present

## 2014-09-27 DIAGNOSIS — I2510 Atherosclerotic heart disease of native coronary artery without angina pectoris: Secondary | ICD-10-CM | POA: Diagnosis not present

## 2014-09-27 DIAGNOSIS — I252 Old myocardial infarction: Secondary | ICD-10-CM | POA: Insufficient documentation

## 2014-09-27 DIAGNOSIS — Z8711 Personal history of peptic ulcer disease: Secondary | ICD-10-CM | POA: Insufficient documentation

## 2014-09-27 DIAGNOSIS — Z9889 Other specified postprocedural states: Secondary | ICD-10-CM | POA: Insufficient documentation

## 2014-09-27 DIAGNOSIS — I509 Heart failure, unspecified: Secondary | ICD-10-CM | POA: Insufficient documentation

## 2014-09-27 DIAGNOSIS — Z9861 Coronary angioplasty status: Secondary | ICD-10-CM | POA: Diagnosis not present

## 2014-09-27 DIAGNOSIS — Z85118 Personal history of other malignant neoplasm of bronchus and lung: Secondary | ICD-10-CM | POA: Diagnosis not present

## 2014-09-27 DIAGNOSIS — F419 Anxiety disorder, unspecified: Secondary | ICD-10-CM | POA: Diagnosis not present

## 2014-09-27 DIAGNOSIS — J449 Chronic obstructive pulmonary disease, unspecified: Secondary | ICD-10-CM | POA: Diagnosis not present

## 2014-09-27 DIAGNOSIS — R0789 Other chest pain: Secondary | ICD-10-CM | POA: Diagnosis not present

## 2014-09-27 DIAGNOSIS — Z86018 Personal history of other benign neoplasm: Secondary | ICD-10-CM | POA: Insufficient documentation

## 2014-09-27 DIAGNOSIS — Z8669 Personal history of other diseases of the nervous system and sense organs: Secondary | ICD-10-CM | POA: Insufficient documentation

## 2014-09-27 DIAGNOSIS — F329 Major depressive disorder, single episode, unspecified: Secondary | ICD-10-CM | POA: Diagnosis not present

## 2014-09-27 DIAGNOSIS — I10 Essential (primary) hypertension: Secondary | ICD-10-CM | POA: Diagnosis not present

## 2014-09-27 DIAGNOSIS — R079 Chest pain, unspecified: Secondary | ICD-10-CM

## 2014-09-27 DIAGNOSIS — Z8619 Personal history of other infectious and parasitic diseases: Secondary | ICD-10-CM | POA: Insufficient documentation

## 2014-09-27 DIAGNOSIS — Z951 Presence of aortocoronary bypass graft: Secondary | ICD-10-CM | POA: Insufficient documentation

## 2014-09-27 DIAGNOSIS — Z79899 Other long term (current) drug therapy: Secondary | ICD-10-CM | POA: Insufficient documentation

## 2014-09-27 DIAGNOSIS — Z87891 Personal history of nicotine dependence: Secondary | ICD-10-CM | POA: Insufficient documentation

## 2014-09-27 DIAGNOSIS — E785 Hyperlipidemia, unspecified: Secondary | ICD-10-CM | POA: Diagnosis not present

## 2014-09-27 DIAGNOSIS — Z7951 Long term (current) use of inhaled steroids: Secondary | ICD-10-CM | POA: Diagnosis not present

## 2014-09-27 DIAGNOSIS — K219 Gastro-esophageal reflux disease without esophagitis: Secondary | ICD-10-CM | POA: Insufficient documentation

## 2014-09-27 DIAGNOSIS — Z8673 Personal history of transient ischemic attack (TIA), and cerebral infarction without residual deficits: Secondary | ICD-10-CM | POA: Insufficient documentation

## 2014-09-27 LAB — CBC
HCT: 41.1 % (ref 36.0–46.0)
Hemoglobin: 14 g/dL (ref 12.0–15.0)
MCH: 32.3 pg (ref 26.0–34.0)
MCHC: 34.1 g/dL (ref 30.0–36.0)
MCV: 94.7 fL (ref 78.0–100.0)
Platelets: 255 10*3/uL (ref 150–400)
RBC: 4.34 MIL/uL (ref 3.87–5.11)
RDW: 13.2 % (ref 11.5–15.5)
WBC: 9.3 10*3/uL (ref 4.0–10.5)

## 2014-09-27 LAB — BASIC METABOLIC PANEL
Anion gap: 9 (ref 5–15)
BUN: 12 mg/dL (ref 6–20)
CHLORIDE: 100 mmol/L — AB (ref 101–111)
CO2: 28 mmol/L (ref 22–32)
Calcium: 9.7 mg/dL (ref 8.9–10.3)
Creatinine, Ser: 0.58 mg/dL (ref 0.44–1.00)
GFR calc non Af Amer: >60 mL/min (ref >60)
Glucose, Bld: 88 mg/dL (ref 70–99)
POTASSIUM: 3.8 mmol/L (ref 3.5–5.1)
SODIUM: 137 mmol/L (ref 135–145)

## 2014-09-27 LAB — I-STAT TROPONIN, ED: TROPONIN I, POC: 0.01 ng/mL (ref 0.00–0.08)

## 2014-09-27 LAB — BRAIN NATRIURETIC PEPTIDE: B Natriuretic Peptide: 199.1 pg/mL — ABNORMAL HIGH (ref 0.0–100.0)

## 2014-09-27 MED ORDER — FENTANYL CITRATE (PF) 100 MCG/2ML IJ SOLN
50.0000 ug | Freq: Once | INTRAMUSCULAR | Status: AC
  Administered 2014-09-27: 50 ug via INTRAVENOUS
  Filled 2014-09-27: qty 2

## 2014-09-27 NOTE — ED Notes (Signed)
49mg of fentanyl was wasted in sink with BAletha Halim RN

## 2014-09-27 NOTE — ED Provider Notes (Signed)
CSN: [id number]     Arrival date & time 09/27/14  1313 History   First MD Initiated Contact with Patient 09/27/14 1325     Chief Complaint  Patient presents with  . Chest Pain     (Consider location/radiation/quality/duration/timing/severity/associated sxs/prior Treatment) Patient is a 64 y.o. female presenting with chest pain. The history is provided by the patient.  Chest Pain Associated symptoms: cough   Associated symptoms: no abdominal pain, no back pain, no headache, no nausea, no numbness, no shortness of breath, not vomiting and no weakness    patient has had left-sided chest pain. Began today. States it feels different that her previous heart problems. She has chronic medical problems and is a DO NOT RESUSCITATE. She is on chronic oxygen. She has a cough that has not really changed from production. The pain is dull and does have some radiation to her left arm. No nausea vomiting. No fevers. Has had previous MIs and has gastritis and esophageal stricture history also.  Past Medical History  Diagnosis Date  . Acute myocardial infarction, unspecified site, episode of care unspecified 2011    VF arrest. Ruled in, cath with no clear culprit lesion, inferior WMA on LV gram, medical therapy  . Esophageal dysmotility   . Hemorrhoids   . Esophageal stricture   . Diaphragmatic hernia without mention of obstruction or gangrene   . 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 CAD (coronary artery disease)     CABG'96., Xience DES -VG-RCA (01/12/13)  . Peripheral neuropathy   . Hyperlipidemia   . Peptic ulcer, unspecified site, unspecified as acute or chronic, without mention of hemorrhage, perforation, or obstruction   . Depressive disorder, not elsewhere classified   . Anxiety state, unspecified   . COPD (chronic obstructive pulmonary disease)   . GERD (gastroesophageal reflux disease)   . Candida esophagitis     on multiple occasions  . C. difficile diarrhea   . PVD  (peripheral vascular disease)   . Headache(784.0)   . Weight loss 06/13/2011    scheduled here by family doctor  . CHF (congestive heart failure) 2011  . Asthma   . Hypertension   . History of nuclear stress test 07/14/2010    dipyriadmole; normal pattern of perfusion, low risk   . History of tobacco use     quit 05/2009  . Non-small cell carcinoma of lung, stage 3     Dr. Roxan Hockey  . History of radiation therapy 02/28/2012-04/14/2012    right lung  . Bilateral carotid artery disease   . TIA (transient ischemic attack)     amaurosis fugax  . Tubular adenoma of colon    Past Surgical History  Procedure Laterality Date  . Tonsillectomy  1974  . Fracture lt jaw  1976  . Breast surgery  1969, 1970    bilateral  . Epigastric hernia repair  2008    Dr Roxan Hockey  . Coronary artery bypass graft  02/01/1994    left internal mammary to LAD, vein graft to diagonal/post descending, post lateral, marginals, circumfle, diagonal  . Carpal tunnel release    . Total abdominal hysterectomy  1998  . Laminectomy    . Subclavian vein angioplasty / stenting Left   . Transthoracic echocardiogram  08/14/2009    EF=>55%, normal LV systolic function, mild hypokinesis of basal segments of inferolateral & anterolater walls; mild MR/TR; AV mildly sclerotic  . Coronary angioplasty with stent placement  08/29/2000    stent to RCA (Dr.  T. Claiborne Billings)  . Cardiac catheterization  01/17/2002    patent grafts (Dr. Marella Chimes)  . Cardiac catheterization  02/13/2004    significant L main & 3 vessel CAD, patents grafts, mild distal aortic disease (Dr. Gerrie Nordmann)  . Cardiac catheterization  07/14/2005    patent grafts, normal LV function (Dr. Adora Fridge)  . Cardiac catheterization  09/26/2008    patent grafts, normal LV function (Dr. Adora Fridge)  . Cardiac catheterization  05/31/2009    patent grafts, inferior wall motion abnormality (Dr. Adora Fridge)  . Cardiac catheterization  10/03/2009    normal LV function; 100% native  RCA, circumflex, distal LAD occlusion  . Coronary angioplasty with stent placement  01/12/2013    Xience DES to SVG-RCA distal  . Carotid doppler  08/14/2009    R & L ICAs - 0-49% diameter reduction   . Left heart catheterization with coronary/graft angiogram N/A 01/12/2013    Procedure: LEFT HEART CATHETERIZATION WITH Beatrix Fetters;  Surgeon: Leonie Man, MD;  Location: [facility] CATH LAB;  Service: Cardiovascular;  Laterality: N/A;   Family History  Problem Relation Age of Onset  . Lung cancer Father     also CVA  . Heart disease Father   . Asthma Father   . Allergies Father   . Cancer Father   . Heart disease Mother     also CVA, MI  . Brain cancer Mother   . Heart disease Sister   . Colon cancer Neg Hx   . Lung cancer Brother 76    also ENT cancer  . Hypertension Brother 49    also aneurysm, early CAD  . Breast cancer Other     family hx   History  Substance Use Topics  . Smoking status: Former Smoker -- 1.50 packs/day for 30 years    Types: Cigarettes    Quit date: 05/24/2013  . Smokeless tobacco: Never Used  . Alcohol Use: No   OB History    No data available     Review of Systems  Constitutional: Negative for activity change and appetite change.  Eyes: Negative for pain.  Respiratory: Positive for cough. Negative for chest tightness and shortness of breath.   Cardiovascular: Positive for chest pain. Negative for leg swelling.  Gastrointestinal: Negative for nausea, vomiting, abdominal pain and diarrhea.  Genitourinary: Negative for flank pain.  Musculoskeletal: Negative for back pain and neck stiffness.  Skin: Negative for rash.  Neurological: Negative for weakness, numbness and headaches.  Psychiatric/Behavioral: Negative for behavioral problems.      Allergies  Amiodarone; Clopidogrel bisulfate; Dilaudid; and Multaq  Home Medications   Prior to Admission medications   Medication Sig Start Date End Date Taking? Authorizing Provider  acyclovir  ointment (ZOVIRAX) 5 % Apply 1 application topically every 3 (three) hours as needed (fever blisters).  07/31/14  Yes Historical Provider, MD  albuterol (PROVENTIL HFA;VENTOLIN HFA) 108 (90 BASE) MCG/ACT inhaler Inhale 2 puffs into the lungs every 6 (six) hours as needed for wheezing or shortness of breath. 07/02/14  Yes Chesley Mires, MD  aspirin 81 MG EC tablet Take 81 mg by mouth daily.     Yes Historical Provider, MD  atorvastatin (LIPITOR) 80 MG tablet Take 80 mg by mouth at bedtime.   Yes Historical Provider, MD  Cholecalciferol (VITAMIN D-3) 5000 UNITS TABS Take 5,000 Units by mouth daily.    Yes Historical Provider, MD  diazepam (VALIUM) 5 MG tablet Take 5 mg by mouth 2 (two) times daily.  Yes Historical Provider, MD  EFFIENT 10 MG TABS tablet TAKE 1 TABLET BY MOUTH EVERY DAY 03/18/14  Yes Lorretta Harp, MD  famotidine (PEPCID) 20 MG tablet Take 1 tablet (20 mg total) by mouth at bedtime. 07/15/14  Yes Noralee Space, MD  feeding supplement, ENSURE COMPLETE, (ENSURE COMPLETE) LIQD Take 237 mLs by mouth 3 (three) times daily between meals. 06/26/14  Yes Debbe Odea, MD  fluticasone (FLONASE) 50 MCG/ACT nasal spray Place 1 spray into both nostrils daily as needed for allergies or rhinitis. 03/05/14  Yes Ripudeep Krystal Eaton, MD  guaiFENesin (MUCINEX) 600 MG 12 hr tablet Take 1 tablet (600 mg total) by mouth 2 (two) times daily. 06/26/14  Yes Debbe Odea, MD  HYDROcodone-acetaminophen (NORCO) 10-325 MG per tablet Take 1 tablet by mouth every 4 (four) hours as needed for moderate pain.    Yes Historical Provider, MD  loratadine (CLARITIN) 10 MG tablet Take 1 tablet (10 mg total) by mouth daily as needed for allergies. 03/05/14  Yes Ripudeep K Rai, MD  montelukast (SINGULAIR) 10 MG tablet Take 10 mg by mouth at bedtime.   Yes Historical Provider, MD  nitroGLYCERIN (NITROSTAT) 0.4 MG SL tablet Place 0.4 mg under the tongue every 5 (five) minutes as needed for chest pain.    Yes Historical Provider, MD   nortriptyline (PAMELOR) 25 MG capsule Take 1 capsule (25 mg total) by mouth at bedtime. 09/24/14  Yes Britt Bottom, MD  nystatin (MYCOSTATIN) 100000 UNIT/ML suspension Use as directed 5 mLs in the mouth or throat 4 (four) times daily.  07/22/14  Yes Historical Provider, MD  omeprazole (PRILOSEC) 20 MG capsule Take 20 mg by mouth 2 (two) times daily before a meal.   Yes Historical Provider, MD  ondansetron (ZOFRAN) 4 MG tablet Take 1 tablet (4 mg total) by mouth every 8 (eight) hours as needed for nausea or vomiting. 09/07/14  Yes Tori Milks, MD  PROAIR HFA 108 (90 BASE) MCG/ACT inhaler INHALE 2 PUFFS INTO THE LUNGS EVERY 6 HOURS AS NEEDED FOR WHEEZING. 07/09/14  Yes Chesley Mires, MD  SYMBICORT 160-4.5 MCG/ACT inhaler INHALE 2 PUFFS INTO THE LUNGS TWICE A DAY THEN RINSE AFTER DOSING 08/23/14  Yes Chesley Mires, MD  tiotropium (SPIRIVA) 18 MCG inhalation capsule Place 1 capsule (18 mcg total) into inhaler and inhale daily. 05/15/14  Yes Noralee Space, MD  ZETIA 10 MG tablet TAKE 1 TABLET BY MOUTH EVERY DAY 03/25/14  Yes Lorretta Harp, MD  albuterol (PROVENTIL) (2.5 MG/3ML) 0.083% nebulizer solution Take 3 mLs (2.5 mg total) by nebulization every 6 (six) hours as needed for wheezing or shortness of breath. 09/17/14   Chesley Mires, MD  montelukast (SINGULAIR) 10 MG tablet TAKE 1 TABLET BY MOUTH EVERY EVENING TO PREVENT COUGH OR WHEEZING Patient not taking: Reported on 09/24/2014 09/18/14   Chesley Mires, MD  Oxycodone HCl 10 MG TABS  09/12/14   Historical Provider, MD  OXYGEN-HELIUM IN Inhale 4 L into the lungs continuous.    Historical Provider, MD  predniSONE (DELTASONE) 20 MG tablet  08/19/14   Historical Provider, MD   BP 120/60 mmHg  Pulse 92  Temp(Src) 98.9 F (37.2 C) (Oral)  Resp 21  SpO2 100% Physical Exam  Constitutional: She is oriented to person, place, and time. She appears well-developed and well-nourished.  Chronically ill-appearing  HENT:  Head: Normocephalic and atraumatic.  Patient is  on nasal oxygen  Eyes: EOM are normal. Pupils are equal, round, and reactive to  light.  Neck: Normal range of motion. Neck supple.  Cardiovascular: Normal rate, regular rhythm and normal heart sounds.   No murmur heard. Pulmonary/Chest: Effort normal. She exhibits tenderness.  Mildly harsh breath sounds throughout. Some tenderness to left anterior chest wall. No rash.  Abdominal: Soft. Bowel sounds are normal. She exhibits no distension. There is no tenderness.  Musculoskeletal: Normal range of motion.  Neurological: She is alert and oriented to person, place, and time. No cranial nerve deficit.  Skin: Skin is warm and dry.  Psychiatric: She has a normal mood and affect. Her speech is normal.  Nursing note and vitals reviewed.   ED Course  Procedures (including critical care time) Labs Review Labs Reviewed  BASIC METABOLIC PANEL - Abnormal; Notable for the following:    Chloride 100 (*)    All other components within normal limits  BRAIN NATRIURETIC PEPTIDE - Abnormal; Notable for the following:    B Natriuretic Peptide 199.1 (*)    All other components within normal limits  CBC  I-STAT TROPOININ, ED    Imaging Review Dg Chest 2 View  09/27/2014   CLINICAL DATA:  One day history of chest pain  EXAM: CHEST  2 VIEW  COMPARISON:  July 28, 2014  FINDINGS: There is underlying emphysematous change. There is no edema or consolidation. Heart size is normal. Pulmonary vascular reflects underlying emphysema. There is postoperative change which appear stable. No adenopathy. There is anterior wedging of a lower thoracic vertebral body with screw and plate fixation throughout much of the lower thoracic and visualized upper lumbar spine regions.  IMPRESSION: Underlying emphysematous change. No edema or consolidation. Stable postoperative change. Stable wedging of a lower thoracic vertebral body.   Electronically Signed   By: Lowella Grip III M.D.   On: 09/27/2014 14:27     EKG  Interpretation   Date/Time:  Friday Sep 27 2014 13:20:39 EDT Ventricular Rate:  81 PR Interval:  116 QRS Duration: 84 QT Interval:  377 QTC Calculation: 438 R Axis:   -60 Text Interpretation:  Sinus tachycardia Multiform ventricular premature  complexes Borderline short PR interval Probable left atrial enlargement  RSR' in V1 or V2, right VCD or RVH Left ventricular hypertrophy Baseline  wander in lead(s) V5 V6 Confirmed by Alvino Chapel  MD, Ovid Curd [phone]) on  09/27/2014 1:26:43 PM Also confirmed by Alvino Chapel  MD, Ovid Curd [phone])  on  09/27/2014 1:27:12 PM      MDM   Final diagnoses:  Chest pain, unspecified chest pain type    Patient with left-sided chest pain. EKG and lab work overall reassuring. Troponin negative. Patient is eager to go home. She does have a strong cardiac history with her multiple medical problems and eagerness to go home I will discharge her. She understands that cardiac disease is not been completely ruled out. Will discharge home. Doubt pulmonary embolism. She has artery on anticoagulation.    Davonna Belling, MD 09/27/14 [phone]

## 2014-09-27 NOTE — Discharge Instructions (Signed)

## 2014-09-27 NOTE — ED Notes (Signed)
Fentanyl 50 mcg wasted at sink with Chief Strategy Officer.

## 2014-09-27 NOTE — ED Notes (Signed)
Pt states that she is ready to go home, this RN explained delay. Pt states she does not want to be admitted if it comes to that.

## 2014-09-27 NOTE — ED Notes (Signed)
Per EMS: pt from home for eval of sudden onset of left sided cp with radiation to left arm, neck and back. Pt states she was brushing her teeth when she felt lightheaded and cp began. Pt took 1 nitro and 324 of aspirin at home, no meds administered by EMS. EMS noted pain to be reproduceable upon palpation but does not increase with inspiration or movement. nad noted at this time, pt with hx of MI.

## 2014-09-30 ENCOUNTER — Telehealth: Payer: Self-pay | Admitting: Cardiovascular Disease

## 2014-09-30 NOTE — Telephone Encounter (Signed)
She needs a disability form signed by Dr Josetta Huddle will give you details.

## 2014-09-30 NOTE — Telephone Encounter (Signed)
Call referred to [facility]

## 2014-10-09 ENCOUNTER — Telehealth: Payer: Self-pay | Admitting: Cardiovascular Disease

## 2014-10-09 NOTE — Telephone Encounter (Signed)
Pt wants to know if Dr Gwenlyn Found have signed the papers she needs?

## 2014-10-09 NOTE — Telephone Encounter (Signed)
Spoke to [employer]- the form is ready to be picked up. Notified patient - the information will be at the front desk   Patient verbalized understanding.

## 2014-10-17 ENCOUNTER — Telehealth: Payer: Self-pay | Admitting: Cardiovascular Disease

## 2014-10-17 MED ORDER — ESCITALOPRAM OXALATE 10 MG PO TABS: 10.0000 mg | ORAL_TABLET | Freq: Every day | ORAL | Status: DC

## 2014-10-17 MED ORDER — ALPRAZOLAM 0.25 MG PO TABS: 0.2500 mg | ORAL_TABLET | Freq: Two times a day (BID) | ORAL | Status: DC | PRN

## 2014-10-17 NOTE — Telephone Encounter (Signed)
I contacted patient.  While on the phone, she began crying and explaining that she feels depressed every day and ends up crying.  I spoke with Dr Gwenlyn Found and he authorized that we could start her on Lexapro '10mg'$  daily.  She verbalized understanding.  I questioned patient about the diazepam that was listed on her med list.  She said that she was prescribed that for muscle spasms in her legs after her back surgery.  She says that she doesn't take it anymore.  I removed it from her list and advised that she would NOT take it and the xanax (just in case she had any left).  She promised me that she would discard of any left over diazepam.  I called in the xanax prescription.  I also spoke with Ms Cantu about talking with a counselor. She commented that one comes to her home and was there yesterday.  She denied any feelings of hurting herself or someone else. Ms Braud promised that she would call me with any harmful thoughts are additional problems.

## 2014-10-17 NOTE — Telephone Encounter (Signed)
Dr Berry, please advise 

## 2014-10-17 NOTE — Telephone Encounter (Signed)
She said she wanted to talk you. She says she needs something for her nerves please.

## 2014-10-17 NOTE — Telephone Encounter (Signed)
Okay to prescribe Xanax

## 2014-10-25 ENCOUNTER — Other Ambulatory Visit: Payer: Self-pay | Admitting: Pulmonary Disease

## 2014-10-31 ENCOUNTER — Telehealth: Payer: Self-pay | Admitting: *Deleted

## 2014-10-31 NOTE — Telephone Encounter (Signed)
Patient walked in requesting a handicapp sticker to be signed by Dr Gwenlyn Found.  Form signed and ready for pick up.  I attempted to call patient, but phone is busy.   I spoke with patient.  She wished for me to mail the form.  Form placed in the mail.

## 2014-11-05 ENCOUNTER — Telehealth: Payer: Self-pay | Admitting: Cardiovascular Disease

## 2014-11-05 NOTE — Telephone Encounter (Signed)
Pt called in wanting to speak with Curt Bears about her Handicapped sticker application she was suppose to mail back to her. She says that she has not received it as of yet and would like to speak with her about it.   Thanks

## 2014-11-06 ENCOUNTER — Telehealth: Payer: Self-pay | Admitting: Cardiovascular Disease

## 2014-11-06 NOTE — Telephone Encounter (Signed)
I spoke with patient.  I mailed the form on 10/31/14.  It probably went out in the mail 6/10.  She will let me know when she receives the form.

## 2014-11-06 NOTE — Telephone Encounter (Signed)
Pt called in wanting to let Curt Bears know that she received her Handicapped application.  Thanks

## 2014-11-06 NOTE — Telephone Encounter (Signed)
Pt says she still have not received her handicapped application.

## 2014-11-06 NOTE — Telephone Encounter (Signed)
noted 

## 2014-11-08 ENCOUNTER — Other Ambulatory Visit: Payer: Self-pay | Admitting: Internal Medicine

## 2014-11-13 ENCOUNTER — Ambulatory Visit (HOSPITAL_COMMUNITY): Payer: Medicaid Other

## 2014-11-13 ENCOUNTER — Other Ambulatory Visit: Payer: Medicaid Other

## 2014-11-14 ENCOUNTER — Other Ambulatory Visit (HOSPITAL_BASED_OUTPATIENT_CLINIC_OR_DEPARTMENT_OTHER): Payer: Medicaid Other

## 2014-11-14 ENCOUNTER — Ambulatory Visit (HOSPITAL_COMMUNITY)
Admission: RE | Admit: 2014-11-14 | Discharge: 2014-11-14 | Disposition: A | Discharge status: Home / Self Care | Payer: Medicaid Other | Source: Ambulatory Visit | Attending: Internal Medicine | Admitting: Internal Medicine

## 2014-11-14 DIAGNOSIS — R911 Solitary pulmonary nodule: Secondary | ICD-10-CM | POA: Diagnosis not present

## 2014-11-14 DIAGNOSIS — C3491 Malignant neoplasm of unspecified part of right bronchus or lung: Secondary | ICD-10-CM | POA: Diagnosis not present

## 2014-11-14 DIAGNOSIS — I700 Atherosclerosis of aorta: Secondary | ICD-10-CM | POA: Diagnosis not present

## 2014-11-14 DIAGNOSIS — IMO0002 Reserved for concepts with insufficient information to code with codable children: Secondary | ICD-10-CM

## 2014-11-14 DIAGNOSIS — J439 Emphysema, unspecified: Secondary | ICD-10-CM | POA: Insufficient documentation

## 2014-11-14 DIAGNOSIS — M4854XA Collapsed vertebra, not elsewhere classified, thoracic region, initial encounter for fracture: Secondary | ICD-10-CM | POA: Insufficient documentation

## 2014-11-14 DIAGNOSIS — Z85118 Personal history of other malignant neoplasm of bronchus and lung: Secondary | ICD-10-CM | POA: Diagnosis not present

## 2014-11-14 DIAGNOSIS — M549 Dorsalgia, unspecified: Secondary | ICD-10-CM | POA: Diagnosis present

## 2014-11-14 LAB — CBC WITH DIFFERENTIAL/PLATELET
BASO%: 0.1 % (ref 0.0–2.0)
Basophils Absolute: 0 10*3/uL (ref 0.0–0.1)
EOS ABS: 0.1 10*3/uL (ref 0.0–0.5)
EOS%: 1.7 % (ref 0.0–7.0)
HEMATOCRIT: 38.2 % (ref 34.8–46.6)
HGB: 12.9 g/dL (ref 11.6–15.9)
LYMPH%: 12.5 % — ABNORMAL LOW (ref 14.0–49.7)
MCH: 32.3 pg (ref 25.1–34.0)
MCHC: 33.8 g/dL (ref 31.5–36.0)
MCV: 95.7 fL (ref 79.5–101.0)
MONO#: 0.5 10*3/uL (ref 0.1–0.9)
MONO%: 6.9 % (ref 0.0–14.0)
NEUT#: 6 10*3/uL (ref 1.5–6.5)
NEUT%: 78.8 % — AB (ref 38.4–76.8)
PLATELETS: 192 10*3/uL (ref 145–400)
RBC: 3.99 10*6/uL (ref 3.70–5.45)
RDW: 13.3 % (ref 11.2–14.5)
WBC: 7.7 10*3/uL (ref 3.9–10.3)
lymph#: 1 10*3/uL (ref 0.9–3.3)

## 2014-11-14 LAB — COMPREHENSIVE METABOLIC PANEL (CC13)
ALK PHOS: 98 U/L (ref 40–150)
ALT: 17 U/L (ref 0–55)
AST: 21 U/L (ref 5–34)
Albumin: 3.9 g/dL (ref 3.5–5.0)
Anion Gap: 7 mEq/L (ref 3–11)
BUN: 11.5 mg/dL (ref 7.0–26.0)
CO2: 32 mEq/L — ABNORMAL HIGH (ref 22–29)
Calcium: 9.2 mg/dL (ref 8.4–10.4)
Chloride: 100 mEq/L (ref 98–109)
Creatinine: 0.6 mg/dL (ref 0.6–1.1)
EGFR: >90 mL/min/{1.73_m2} (ref >90)
Glucose: 88 mg/dl (ref 70–140)
Potassium: 4.3 mEq/L (ref 3.5–5.1)
Sodium: 139 mEq/L (ref 136–145)
TOTAL PROTEIN: 6.7 g/dL (ref 6.4–8.3)
Total Bilirubin: 0.29 mg/dL (ref 0.20–1.20)

## 2014-11-14 MED ORDER — IOHEXOL 300 MG/ML  SOLN
100.0000 mL | Freq: Once | INTRAMUSCULAR | Status: AC | PRN
  Administered 2014-11-14: 60 mL via INTRAVENOUS

## 2014-11-18 ENCOUNTER — Encounter: Payer: Self-pay | Admitting: Internal Medicine

## 2014-11-18 ENCOUNTER — Other Ambulatory Visit: Payer: Self-pay | Admitting: Cardiovascular Disease

## 2014-11-18 ENCOUNTER — Ambulatory Visit (HOSPITAL_BASED_OUTPATIENT_CLINIC_OR_DEPARTMENT_OTHER): Payer: Medicaid Other | Admitting: Internal Medicine

## 2014-11-18 ENCOUNTER — Telehealth: Payer: Self-pay | Admitting: Internal Medicine

## 2014-11-18 VITALS — BP 155/71 | HR 85 | Temp 98.2°F | Resp 19 | Ht 66.0 in | Wt 91.4 lb

## 2014-11-18 DIAGNOSIS — C3411 Malignant neoplasm of upper lobe, right bronchus or lung: Secondary | ICD-10-CM

## 2014-11-18 DIAGNOSIS — IMO0002 Reserved for concepts with insufficient information to code with codable children: Secondary | ICD-10-CM

## 2014-11-18 NOTE — Patient Instructions (Signed)
Smoking Cessation, Tips for Success  If you are ready to quit smoking, congratulations! You have chosen to help yourself be healthier. Cigarettes bring nicotine, tar, carbon monoxide, and other irritants into your body. Your lungs, heart, and blood vessels will be able to work better without these poisons. There are many different ways to quit smoking. Nicotine gum, nicotine patches, a nicotine inhaler, or nicotine nasal spray can help with physical craving. Hypnosis, support groups, and medicines help break the habit of smoking.  WHAT THINGS CAN I DO TO MAKE QUITTING EASIER?   Here are some tips to help you quit for good:  · Pick a date when you will quit smoking completely. Tell all of your friends and family about your plan to quit on that date.  · Do not try to slowly cut down on the number of cigarettes you are smoking. Pick a quit date and quit smoking completely starting on that day.  · Throw away all cigarettes.    · Clean and remove all ashtrays from your home, work, and car.  · On a card, write down your reasons for quitting. Carry the card with you and read it when you get the urge to smoke.  · Cleanse your body of nicotine. Drink enough water and fluids to keep your urine clear or pale yellow. Do this after quitting to flush the nicotine from your body.  · Learn to predict your moods. Do not let a bad situation be your excuse to have a cigarette. Some situations in your life might tempt you into wanting a cigarette.  · Never have "just one" cigarette. It leads to wanting another and another. Remind yourself of your decision to quit.  · Change habits associated with smoking. If you smoked while driving or when feeling stressed, try other activities to replace smoking. Stand up when drinking your coffee. Brush your teeth after eating. Sit in a different chair when you read the paper. Avoid alcohol while trying to quit, and try to drink fewer caffeinated beverages. Alcohol and caffeine may urge you to  smoke.  · Avoid foods and drinks that can trigger a desire to smoke, such as sugary or spicy foods and alcohol.  · Ask people who smoke not to smoke around you.  · Have something planned to do right after eating or having a cup of coffee. For example, plan to take a walk or exercise.  · Try a relaxation exercise to calm you down and decrease your stress. Remember, you may be tense and nervous for the first 2 weeks after you quit, but this will pass.  · Find new activities to keep your hands busy. Play with a pen, coin, or rubber band. Doodle or draw things on paper.  · Brush your teeth right after eating. This will help cut down on the craving for the taste of tobacco after meals. You can also try mouthwash.    · Use oral substitutes in place of cigarettes. Try using lemon drops, carrots, cinnamon sticks, or chewing gum. Keep them handy so they are available when you have the urge to smoke.  · When you have the urge to smoke, try deep breathing.  · Designate your home as a nonsmoking area.  · If you are a heavy smoker, ask your health care provider about a prescription for nicotine chewing gum. It can ease your withdrawal from nicotine.  · Reward yourself. Set aside the cigarette money you save and buy yourself something nice.  · Look for   support from others. Join a support group or smoking cessation program. Ask someone at home or at work to help you with your plan to quit smoking.  · Always ask yourself, "Do I need this cigarette or is this just a reflex?" Tell yourself, "Today, I choose not to smoke," or "I do not want to smoke." You are reminding yourself of your decision to quit.  · Do not replace cigarette smoking with electronic cigarettes (commonly called e-cigarettes). The safety of e-cigarettes is unknown, and some may contain harmful chemicals.  · If you relapse, do not give up! Plan ahead and think about what you will do the next time you get the urge to smoke.  HOW WILL I FEEL WHEN I QUIT SMOKING?  You  may have symptoms of withdrawal because your body is used to nicotine (the addictive substance in cigarettes). You may crave cigarettes, be irritable, feel very hungry, cough often, get headaches, or have difficulty concentrating. The withdrawal symptoms are only temporary. They are strongest when you first quit but will go away within 10-14 days. When withdrawal symptoms occur, stay in control. Think about your reasons for quitting. Remind yourself that these are signs that your body is healing and getting used to being without cigarettes. Remember that withdrawal symptoms are easier to treat than the major diseases that smoking can cause.   Even after the withdrawal is over, expect periodic urges to smoke. However, these cravings are generally short lived and will go away whether you smoke or not. Do not smoke!  WHAT RESOURCES ARE AVAILABLE TO HELP ME QUIT SMOKING?  Your health care provider can direct you to community resources or hospitals for support, which may include:  · Group support.  · Education.  · Hypnosis.  · Therapy.  Document Released: 02/06/2004 Document Revised: 09/24/2013 Document Reviewed: 10/26/2012  Exit[employer]® Patient Information ©2015 [employer]. This information is not intended to replace advice given to you by your health care provider. Make sure you discuss any questions you have with your health care provider.

## 2014-11-18 NOTE — Telephone Encounter (Signed)
s.w. pt and advised on DEC appt.Marland KitchenMarland KitchenMarland KitchenMarland Kitchenpt ok and aware.....mailed pt appt sched/avs adn letter

## 2014-11-18 NOTE — Progress Notes (Signed)
[facility] Telephone:([phone]) [phone]   Fax:([phone]) [place], [place] [address]  DIAGNOSIS: Stage IIIA (T2a, N2, MX) non-small cell lung cancer, squamous cell carcinoma diagnosed in September of 2013.   PRIOR THERAPY: Status post a course of concurrent chemoradiation with chemotherapy in the form of weekly carboplatin for AUC of 2 and paclitaxel at 45 mg/m2. The paclitaxel was discontinued after cycle #5 secondary to peripheral neuropathy. Last dose was given on 04/10/2012 with partial response.  CURRENT THERAPY: None  INTERVAL HISTORY: [name] 64 y.o. female returns to the clinic today for follow up visit accompanied by her grandson. Unfortunately she lost her husband 6 months ago. She also lost her son to heroin addiction few months ago. She continues to smoke and not willing to quit The patient continues to have shortness of breath with exertion and mild cough. She denied having any significant chest pain or hemoptysis. The patient denied having any significant weight loss or night sweats. She has no nausea or vomiting. She had repeat CT scan of the chest performed recently and she is here for evaluation and discussion of her scan results.   MEDICAL HISTORY: Past Medical History  Diagnosis Date  . Acute myocardial infarction, unspecified site, episode of care unspecified 2011    VF arrest. Ruled in, cath with no clear culprit lesion, inferior WMA on LV gram, medical therapy  . Esophageal dysmotility   . Hemorrhoids   . Esophageal stricture   . Diaphragmatic hernia without mention of obstruction or gangrene   . 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 CAD (coronary artery disease)     CABG'96., Xience DES -VG-RCA (01/12/13)  . Peripheral neuropathy   . Hyperlipidemia   . Peptic ulcer, unspecified site, unspecified as acute or chronic, without mention of hemorrhage, perforation, or  obstruction   . Depressive disorder, not elsewhere classified   . Anxiety state, unspecified   . COPD (chronic obstructive pulmonary disease)   . GERD (gastroesophageal reflux disease)   . Candida esophagitis     on multiple occasions  . C. difficile diarrhea   . PVD (peripheral vascular disease)   . Headache(784.0)   . Weight loss 06/13/2011    scheduled here by family doctor  . CHF (congestive heart failure) 2011  . Asthma   . Hypertension   . History of nuclear stress test 07/14/2010    dipyriadmole; normal pattern of perfusion, low risk   . History of tobacco use     quit 05/2009  . Non-small cell carcinoma of lung, stage 3     Dr. Roxan Hockey  . History of radiation therapy 02/28/2012-04/14/2012    right lung  . Bilateral carotid artery disease   . TIA (transient ischemic attack)     amaurosis fugax  . Tubular adenoma of colon     ALLERGIES:  is allergic to amiodarone; clopidogrel bisulfate; dilaudid; and multaq.  MEDICATIONS:  Current Outpatient Prescriptions  Medication Sig Dispense Refill  . acyclovir ointment (ZOVIRAX) 5 % Apply 1 application topically every 3 (three) hours as needed (fever blisters).   1  . albuterol (PROVENTIL HFA;VENTOLIN HFA) 108 (90 BASE) MCG/ACT inhaler Inhale 2 puffs into the lungs every 6 (six) hours as needed for wheezing or shortness of breath. 18 g 3  . albuterol (PROVENTIL) (2.5 MG/3ML) 0.083% nebulizer solution Take 3 mLs (2.5 mg total) by nebulization every 6 (six) hours as  needed for wheezing or shortness of breath. 120 vial 5  . ALPRAZolam (XANAX) 0.25 MG tablet Take 1 tablet (0.25 mg total) by mouth 2 (two) times daily as needed for anxiety. 15 tablet 0  . atorvastatin (LIPITOR) 80 MG tablet Take 80 mg by mouth at bedtime.    . Cholecalciferol (VITAMIN D-3) 5000 UNITS TABS Take 5,000 Units by mouth daily.     . diazepam (VALIUM) 5 MG tablet Take 5 mg by mouth 2 (two) times daily as needed.  2  . EFFIENT 10 MG TABS tablet TAKE 1 TABLET BY  MOUTH EVERY DAY 30 tablet 5  . famotidine (PEPCID) 20 MG tablet Take 1 tablet (20 mg total) by mouth at bedtime. 30 tablet 1  . feeding supplement, ENSURE COMPLETE, (ENSURE COMPLETE) LIQD Take 237 mLs by mouth 3 (three) times daily between meals. 90 Bottle 0  . fluticasone (FLONASE) 50 MCG/ACT nasal spray Place 1 spray into both nostrils daily as needed for allergies or rhinitis. 16 g 2  . guaiFENesin (MUCINEX) 600 MG 12 hr tablet Take 1 tablet (600 mg total) by mouth 2 (two) times daily. 60 tablet 0  . loratadine (CLARITIN) 10 MG tablet Take 1 tablet (10 mg total) by mouth daily as needed for allergies. 30 tablet 3  . megestrol (MEGACE) 40 MG/ML suspension Take 20 mLs by mouth daily.  5  . montelukast (SINGULAIR) 10 MG tablet Take 10 mg by mouth at bedtime.    . nortriptyline (PAMELOR) 25 MG capsule Take 1 capsule (25 mg total) by mouth at bedtime. 30 capsule 3  . nystatin (MYCOSTATIN) 100000 UNIT/ML suspension Use as directed 5 mLs in the mouth or throat 4 (four) times daily.   2  . omeprazole (PRILOSEC) 20 MG capsule Take 20 mg by mouth 2 (two) times daily before a meal.    . ondansetron (ZOFRAN) 4 MG tablet Take 1 tablet (4 mg total) by mouth every 8 (eight) hours as needed for nausea or vomiting. 12 tablet 0  . oxyCODONE (ROXICODONE) 15 MG immediate release tablet Take 15 mg by mouth every 4 (four) hours as needed.  0  . OXYGEN-HELIUM IN Inhale 4 L into the lungs continuous.    Marland Kitchen SPIRIVA HANDIHALER 18 MCG inhalation capsule PLACE 1 CAPSULE (18 MCG TOTAL) INTO INHALER AND INHALE DAILY. 30 capsule 6  . SYMBICORT 160-4.5 MCG/ACT inhaler INHALE 2 PUFFS INTO THE LUNGS TWICE A DAY THEN RINSE AFTER DOSING 10.2 Inhaler 6  . ZETIA 10 MG tablet TAKE 1 TABLET BY MOUTH EVERY DAY 30 tablet 11  . escitalopram (LEXAPRO) 10 MG tablet Take 1 tablet (10 mg total) by mouth daily. 30 tablet 11  . fluconazole (DIFLUCAN) 150 MG tablet TAKE 2 TABS ON THE FIRST DAY THEN 1 TAB A DAY FOR THE NEXT 4 DAYS  0  .  nitroGLYCERIN (NITROSTAT) 0.4 MG SL tablet Place 0.4 mg under the tongue every 5 (five) minutes as needed for chest pain.      No current facility-administered medications for this visit.    SURGICAL HISTORY:  Past Surgical History  Procedure Laterality Date  . Tonsillectomy  1974  . Fracture lt jaw  1976  . Breast surgery  1969, 1970    bilateral  . Epigastric hernia repair  2008    Dr Roxan Hockey  . Coronary artery bypass graft  02/01/1994    left internal mammary to LAD, vein graft to diagonal/post descending, post lateral, marginals, circumfle, diagonal  . Carpal tunnel release    .  Total abdominal hysterectomy  1998  . Laminectomy    . Subclavian vein angioplasty / stenting Left   . Transthoracic echocardiogram  08/14/2009    EF=>55%, normal LV systolic function, mild hypokinesis of basal segments of inferolateral & anterolater walls; mild MR/TR; AV mildly sclerotic  . Coronary angioplasty with stent placement  08/29/2000    stent to RCA (Dr. Corky Downs)  . Cardiac catheterization  01/17/2002    patent grafts (Dr. Marella Chimes)  . Cardiac catheterization  02/13/2004    significant L main & 3 vessel CAD, patents grafts, mild distal aortic disease (Dr. Gerrie Nordmann)  . Cardiac catheterization  07/14/2005    patent grafts, normal LV function (Dr. Adora Fridge)  . Cardiac catheterization  09/26/2008    patent grafts, normal LV function (Dr. Adora Fridge)  . Cardiac catheterization  05/31/2009    patent grafts, inferior wall motion abnormality (Dr. Adora Fridge)  . Cardiac catheterization  10/03/2009    normal LV function; 100% native RCA, circumflex, distal LAD occlusion  . Coronary angioplasty with stent placement  01/12/2013    Xience DES to SVG-RCA distal  . Carotid doppler  08/14/2009    R & L ICAs - 0-49% diameter reduction   . Left heart catheterization with coronary/graft angiogram N/A 01/12/2013    Procedure: LEFT HEART CATHETERIZATION WITH Beatrix Fetters;  Surgeon: Leonie Man, MD;   Location: [facility] CATH LAB;  Service: Cardiovascular;  Laterality: N/A;    REVIEW OF SYSTEMS:  A comprehensive review of systems was negative except for: Respiratory: positive for cough and dyspnea on exertion   PHYSICAL EXAMINATION: General appearance: alert, cooperative and no distress Head: Normocephalic, without obvious abnormality, atraumatic Neck: no adenopathy, no JVD and thyroid not enlarged, symmetric, no tenderness/mass/nodules Lymph nodes: Cervical, supraclavicular, and axillary nodes normal. Resp: rhonchi bilaterally and wheezes bilaterally Cardio: regular rate and rhythm, S1, S2 normal, no murmur, click, rub or gallop GI: soft, non-tender; bowel sounds normal; no masses,  no organomegaly Extremities: extremities normal, atraumatic, no cyanosis or edema  ECOG PERFORMANCE STATUS: 1 - Symptomatic but completely ambulatory  Blood pressure 155/71, pulse 85, temperature 98.2 F (36.8 C), temperature source Oral, resp. rate 19, height '5\' 6"'$  (1.676 m), weight 91 lb 6.4 oz (41.459 kg), SpO2 100 %.  LABORATORY DATA: Lab Results  Component Value Date   WBC 7.7 11/14/2014   HGB 12.9 11/14/2014   HCT 38.2 11/14/2014   MCV 95.7 11/14/2014   PLT 192 11/14/2014      Chemistry      Component Value Date/Time   NA 139 11/14/2014 0953   NA 137 09/27/2014 1323   K 4.3 11/14/2014 0953   K 3.8 09/27/2014 1323   CL 100* 09/27/2014 1323   CL 104 11/07/2012 0839   CO2 32* 11/14/2014 0953   CO2 28 09/27/2014 1323   BUN 11.5 11/14/2014 0953   BUN 12 09/27/2014 1323   CREATININE 0.6 11/14/2014 0953   CREATININE 0.58 09/27/2014 1323      Component Value Date/Time   CALCIUM 9.2 11/14/2014 0953   CALCIUM 9.7 09/27/2014 1323   ALKPHOS 98 11/14/2014 0953   ALKPHOS 79 06/26/2014 0535   AST 21 11/14/2014 0953   AST 28 06/26/2014 0535   ALT 17 11/14/2014 0953   ALT 38* 06/26/2014 0535   BILITOT 0.29 11/14/2014 0953   BILITOT 0.3 06/26/2014 0535       RADIOGRAPHIC STUDIES: Ct Chest W  Contrast  11/14/2014   CLINICAL DATA:  Squamous  cell carcinoma of the right lung diagnosed in proximally 2014. Back pain. Multiple prior back surgeries.  EXAM: CT CHEST WITH CONTRAST  TECHNIQUE: Multidetector CT imaging of the chest was performed during intravenous contrast administration.  CONTRAST:  79m OMNIPAQUE IOHEXOL 300 MG/ML  SOLN  COMPARISON:  Multiple exams, including 06/19/2014  FINDINGS: Mediastinum/Nodes: Hypodense right thyroid nodule measuring 8 mm in long axis, image 6 series 2.  Coronary, aortic arch, and branch vessel atherosclerotic vascular disease. Prior CABG. No adenopathy. Slightly poor definition of fat planes in the mediastinum. Severe paucity of subcutaneous adipose tissue.  Lungs/Pleura: 8 mm spiculated right upper lung nodule with adjacent volume loss/scarring. By my measurements this was previously 9 mm.  Extensive centrilobular emphysema. 4 mm right lower lobe pulmonary nodule, image 35 series 5, no change from 04/25/2012, accordingly highly likely to be benign. Scarring or subsegmental atelectasis in the posterior basal segment left lower lobe.  Upper abdomen: Extensive atherosclerosis of the upper abdominal aorta.  Musculoskeletal: Thoracolumbar pressed row lateral rod and pedicle screw fixation. Stable wedge compression fracture at the T10 level, with loosening around the left pedicle screw at T10 potentially from loosening or infection. There has been posterior decompression in the lumbar spine with complete posterior element removal at the L1-2 level. No malalignment.  IMPRESSION: 1. Stable 8 mm spiculated right upper lung nodule. Adjacent scarring noted. 2. Extensive centrilobular emphysema. 3. Considerable atherosclerosis.  Prior CABG. 4. Stable wedge compression fracture at T10. Lucency around the left pedicle screw at T10 may represent loosening or infection.   Electronically Signed   By: WVan ClinesM.D.   On: 11/14/2014 13:33   ASSESSMENT AND PLAN: this is a very  pleasant 64years old white female with history of stage IIIA non-small cell lung cancer status post concurrent chemoradiation with weekly carboplatin and paclitaxel. She has been observation since November of 2013 with no evidence for disease progression.  The recent CT scan of the chest showed no evidence for new or progressive disease within the chest but there was stable spiculated right upper lobe nodule over several scans. I discussed the scan results with the patient and recommended for her to continue on observation with repeat CT scan of the chest in 6 months.  She was advised to call immediately if she has any concerning symptoms in the interval.   The patient voices understanding of current disease status and treatment options and is in agreement with the current care plan.  All questions were answered. The patient knows to call the clinic with any problems, questions or concerns. We can certainly see the patient much sooner if necessary.  Disclaimer: This note was dictated with voice recognition software. Similar sounding words can inadvertently be transcribed and may not be corrected upon review.

## 2014-11-22 ENCOUNTER — Ambulatory Visit (INDEPENDENT_AMBULATORY_CARE_PROVIDER_SITE_OTHER): Payer: Medicaid Other | Admitting: Cardiovascular Disease

## 2014-11-22 ENCOUNTER — Encounter: Payer: Self-pay | Admitting: Cardiovascular Disease

## 2014-11-22 VITALS — BP 122/80 | HR 87 | Ht 66.0 in | Wt 94.0 lb

## 2014-11-22 DIAGNOSIS — I739 Peripheral vascular disease, unspecified: Secondary | ICD-10-CM | POA: Diagnosis not present

## 2014-11-22 DIAGNOSIS — E785 Hyperlipidemia, unspecified: Secondary | ICD-10-CM | POA: Diagnosis not present

## 2014-11-22 DIAGNOSIS — I10 Essential (primary) hypertension: Secondary | ICD-10-CM | POA: Diagnosis not present

## 2014-11-22 DIAGNOSIS — I2510 Atherosclerotic heart disease of native coronary artery without angina pectoris: Secondary | ICD-10-CM | POA: Diagnosis not present

## 2014-11-22 DIAGNOSIS — I6523 Occlusion and stenosis of bilateral carotid arteries: Secondary | ICD-10-CM

## 2014-11-22 NOTE — Assessment & Plan Note (Signed)
History of hyperlipidemia on Zetia and atorvastatin with recent lipid profile performed 03/04/14 revealing a total cholesterol 233, LDL 138 and HDL of 84

## 2014-11-22 NOTE — Assessment & Plan Note (Signed)
History of coronary artery disease status post bypass grafting in 1995 with multiple cath intervention since most recent being performed by Dr. Glenetta Hew on 01/12/13. A Xience DES  was implanted in the distal RCA vein graft. She's remained stable since that time on double anti-platelet therapy. She has an excessive amount of bruising. I'm going to stop her Effient continue baby aspirin at this time.

## 2014-11-22 NOTE — Assessment & Plan Note (Signed)
History of transient amaurosis fugax of the right eye with CT angiography revealing 60% bilateral ICA stenosis. She was seen by Dr. Trula Slade who suggested endarterectomy. She was on dual Therapy because of her drug eluding stent. She is currently a DO NOT RESUSCITATE and is on continuous O2. I believe because of her frailness and comorbidities we should not pursue carotid revascularization.

## 2014-11-22 NOTE — Patient Instructions (Signed)
Dr Gwenlyn Found has recommended making the following medication changes: STOP Effient  Dr Gwenlyn Found recommends that you schedule a follow-up appointment in 1 year. You will receive a reminder letter in the mail two months in advance. If you don't receive a letter, please call our office to schedule the follow-up appointment.

## 2014-11-22 NOTE — Progress Notes (Signed)
11/22/2014 [name]   Feb 04, 1951  [id number]  Primary Physician Tivis Ringer, MD Primary Cardiologist: Lorretta Harp MD [name]   HPI:  The patient is an unfortunate 64 year old, thin and frail-appearing, [name] whose husband [name] is also a patient of mine. Her husband Evelena Peat passed away in November 09, 2022 of last year and her son unfortunately died of heroin overdose in 2022-07-11 of this year.She is a mother of 2, grandmother to 2 grandchildren. I last saw her 9 months ago and have been taking care of her since the early 1990s. She has a history of CAD status post coronary artery bypass grafting in 1995, as well as left subclavian artery PCI and stenting by myself for subclavian steal. Her other problems include discontinued tobacco abuse January 2011, hypertension, dyslipidemia and GERD. Her last cath was performed Oct 03, 2009, revealing patent grafts. She has recently been diagnosed with non-small cell stage IIIA lung cancer diagnosed by biopsy performed by Dr. Roxan Hockey and has had chemo and radiation therapy. [name] follows her as an outpatient. She denies chest pain but has chronic shortness of breath. Her last Myoview performed 07/14/2010 was nonischemic. She was admitted to hospital in August with unstable angina and underwent cardiac catheterization by Dr. Glenetta Hew revealing patent grafts with a high-grade stenosis in her RCA SVG which was stented with a drug-eluting stent. She was hospitalized several weeks ago for acute on chronic respiratory failure thought to be multifactorial. She has advanced COPD, stage IIIB squamous cell lung cancer and moderate LV dysfunction. She is currently on 4 L of oxygen. She appears to be protein calorie malnourished and is losing weight. She was recently admitted to the hospital earlier this month with exacerbation of her COPD and was a TIA characterized by amaurosis fugax in the right eye lasting 5  minutes. Angiography revealed moderate bilateral internal carotid artery stenosis an MRI did not show an acute stroke. She was apparently seen by vascular surgery who suggested carotid revascularization.we have discussed end-of-life issues today and decided to make her a DO NOT RESUSCITATE. I think with all of her comorbidities and debility she would benefit from home health care. Since I saw her she should remain stable on home O2. She denies chest pain. Apparently her lung cancer has remained stable by CT scan.   Current Outpatient Prescriptions  Medication Sig Dispense Refill  . acyclovir ointment (ZOVIRAX) 5 % Apply 1 application topically every 3 (three) hours as needed (fever blisters).   1  . albuterol (PROVENTIL HFA;VENTOLIN HFA) 108 (90 BASE) MCG/ACT inhaler Inhale 2 puffs into the lungs every 6 (six) hours as needed for wheezing or shortness of breath. 18 g 3  . albuterol (PROVENTIL) (2.5 MG/3ML) 0.083% nebulizer solution Take 3 mLs (2.5 mg total) by nebulization every 6 (six) hours as needed for wheezing or shortness of breath. 120 vial 5  . ALPRAZolam (XANAX) 0.25 MG tablet Take 1 tablet (0.25 mg total) by mouth 2 (two) times daily as needed for anxiety. 15 tablet 0  . atorvastatin (LIPITOR) 80 MG tablet Take 80 mg by mouth at bedtime.    . Cholecalciferol (VITAMIN D-3) 5000 UNITS TABS Take 5,000 Units by mouth daily.     . diazepam (VALIUM) 5 MG tablet Take 5 mg by mouth 2 (two) times daily as needed.  2  . escitalopram (LEXAPRO) 10 MG tablet Take 1 tablet (10 mg total) by mouth daily. 30 tablet 11  . famotidine (PEPCID)  20 MG tablet Take 1 tablet (20 mg total) by mouth at bedtime. 30 tablet 1  . feeding supplement, ENSURE COMPLETE, (ENSURE COMPLETE) LIQD Take 237 mLs by mouth 3 (three) times daily between meals. 90 Bottle 0  . fluconazole (DIFLUCAN) 150 MG tablet TAKE 2 TABS ON THE FIRST DAY THEN 1 TAB A DAY FOR THE NEXT 4 DAYS  0  . fluticasone (FLONASE) 50 MCG/ACT nasal spray Place 1  spray into both nostrils daily as needed for allergies or rhinitis. 16 g 2  . guaiFENesin (MUCINEX) 600 MG 12 hr tablet Take 1 tablet (600 mg total) by mouth 2 (two) times daily. 60 tablet 0  . loratadine (CLARITIN) 10 MG tablet Take 1 tablet (10 mg total) by mouth daily as needed for allergies. 30 tablet 3  . megestrol (MEGACE) 40 MG/ML suspension Take 20 mLs by mouth daily.  5  . montelukast (SINGULAIR) 10 MG tablet Take 10 mg by mouth at bedtime.    . nitroGLYCERIN (NITROSTAT) 0.4 MG SL tablet Place 0.4 mg under the tongue every 5 (five) minutes as needed for chest pain.     . nortriptyline (PAMELOR) 25 MG capsule Take 1 capsule (25 mg total) by mouth at bedtime. 30 capsule 3  . nystatin (MYCOSTATIN) 100000 UNIT/ML suspension Use as directed 5 mLs in the mouth or throat 4 (four) times daily.   2  . omeprazole (PRILOSEC) 20 MG capsule Take 20 mg by mouth 2 (two) times daily before a meal.    . ondansetron (ZOFRAN) 4 MG tablet Take 1 tablet (4 mg total) by mouth every 8 (eight) hours as needed for nausea or vomiting. 12 tablet 0  . oxyCODONE (ROXICODONE) 15 MG immediate release tablet Take 15 mg by mouth every 4 (four) hours as needed.  0  . OXYGEN-HELIUM IN Inhale 4 L into the lungs continuous.    . predniSONE (DELTASONE) 20 MG tablet Take 1 tablet by mouth daily.  0  . SPIRIVA HANDIHALER 18 MCG inhalation capsule PLACE 1 CAPSULE (18 MCG TOTAL) INTO INHALER AND INHALE DAILY. 30 capsule 6  . SYMBICORT 160-4.5 MCG/ACT inhaler INHALE 2 PUFFS INTO THE LUNGS TWICE A DAY THEN RINSE AFTER DOSING 10.2 Inhaler 6  . ZETIA 10 MG tablet TAKE 1 TABLET BY MOUTH EVERY DAY 30 tablet 11   No current facility-administered medications for this visit.    Allergies  Allergen Reactions  . Amiodarone Other (See Comments)    Drops HR too low, SOB, fatigue  . Clopidogrel Bisulfate Hives  . Dilaudid [Hydromorphone Hcl] Other (See Comments)    "I couldn't function for 2 days."  . Multaq [Dronedarone] Other (See  Comments)    Heart races     History   Social History  . Marital Status: Married    Spouse Name: N/A  . Number of Children: 2  . Years of Education: N/A   Occupational History  . disabled     back problems   Social History Main Topics  . Smoking status: Current Every Day Smoker -- 1.50 packs/day for 30 years    Types: Cigarettes    Last Attempt to Quit: 05/24/2013  . Smokeless tobacco: Never Used  . Alcohol Use: No  . Drug Use: No  . Sexual Activity: Not on file   Other Topics Concern  . Not on file   Social History Narrative   Married with 2 children, husband has Alzheimer's dementia, retired now, previously Corporate treasurer, Quit smoking in 1995  Review of Systems: General: negative for chills, fever, night sweats or weight changes.  Cardiovascular: negative for chest pain, dyspnea on exertion, edema, orthopnea, palpitations, paroxysmal nocturnal dyspnea or shortness of breath Dermatological: negative for rash Respiratory: negative for cough or wheezing Urologic: negative for hematuria Abdominal: negative for nausea, vomiting, diarrhea, bright red blood per rectum, melena, or hematemesis Neurologic: negative for visual changes, syncope, or dizziness All other systems reviewed and are otherwise negative except as noted above.    Blood pressure 122/80, pulse 87, height '5\' 6"'$  (1.676 m), weight 94 lb (42.638 kg).  General appearance: alert and no distress Neck: no adenopathy, no carotid bruit, no JVD, supple, symmetrical, trachea midline and thyroid not enlarged, symmetric, no tenderness/mass/nodules Lungs: clear to auscultation bilaterally Heart: regular rate and rhythm, S1, S2 normal, no murmur, click, rub or gallop Extremities: extremities normal, atraumatic, no cyanosis or edema  EKG normal sinus rhythm 87 with a right ventricular conduction delay and sinus arrhythmia. I personally reviewed this EKG  ASSESSMENT AND PLAN:   PVD (peripheral  vascular disease)- Rt SCA PTA History of peripheral vascular disease status post left subclavian artery PTA and stenting by myself for subclavian steal in the past. Says remained patent at the time of cardiac catheterizations and she is asymptomatic from this.  Hypertension History of hypertension with blood pressure measured at 120/80. She currently is not on any anti-hypertensive medications.  Hyperlipidemia History of hyperlipidemia on Zetia and atorvastatin with recent lipid profile performed 03/04/14 revealing a total cholesterol 233, LDL 138 and HDL of 84  Coronary atherosclerosis History of coronary artery disease status post bypass grafting in 1995 with multiple cath intervention since most recent being performed by Dr. Glenetta Hew on 01/12/13. A Xience DES  was implanted in the distal RCA vein graft. She's remained stable since that time on double anti-platelet therapy. She has an excessive amount of bruising. I'm going to stop her Effient continue baby aspirin at this time.  Carotid stenosis History of transient amaurosis fugax of the right eye with CT angiography revealing 60% bilateral ICA stenosis. She was seen by Dr. Trula Slade who suggested endarterectomy. She was on dual Therapy because of her drug eluding stent. She is currently a DO NOT RESUSCITATE and is on continuous O2. I believe because of her frailness and comorbidities we should not pursue carotid revascularization.      Lorretta Harp MD FACP,FACC,FAHA, [facility] 11/22/2014 9:49 AM

## 2014-11-22 NOTE — Assessment & Plan Note (Signed)
History of peripheral vascular disease status post left subclavian artery PTA and stenting by myself for subclavian steal in the past. Says remained patent at the time of cardiac catheterizations and she is asymptomatic from this.

## 2014-11-22 NOTE — Assessment & Plan Note (Signed)
History of hypertension with blood pressure measured at 120/80. She currently is not on any anti-hypertensive medications.

## 2014-12-10 ENCOUNTER — Telehealth: Payer: Self-pay | Admitting: Internal Medicine

## 2014-12-10 ENCOUNTER — Other Ambulatory Visit: Payer: Self-pay | Admitting: Pulmonary Disease

## 2014-12-11 MED ORDER — FAMOTIDINE 20 MG PO TABS: 20.0000 mg | ORAL_TABLET | Freq: Every day | ORAL | Status: AC

## 2014-12-11 NOTE — Telephone Encounter (Signed)
Medication refilled

## 2014-12-23 ENCOUNTER — Other Ambulatory Visit: Payer: Self-pay | Admitting: Pulmonary Disease

## 2015-01-08 ENCOUNTER — Inpatient Hospital Stay (HOSPITAL_COMMUNITY)
Admission: EM | Admit: 2015-01-08 | Discharge: 2015-01-10 | DRG: 190 | Disposition: A | Discharge status: Home / Self Care | Payer: Medicaid Other | Attending: Internal Medicine | Admitting: Internal Medicine

## 2015-01-08 ENCOUNTER — Emergency Department (HOSPITAL_COMMUNITY): Payer: Medicaid Other

## 2015-01-08 ENCOUNTER — Encounter (HOSPITAL_COMMUNITY): Payer: Self-pay

## 2015-01-08 DIAGNOSIS — Z801 Family history of malignant neoplasm of trachea, bronchus and lung: Secondary | ICD-10-CM

## 2015-01-08 DIAGNOSIS — Z95820 Peripheral vascular angioplasty status with implants and grafts: Secondary | ICD-10-CM

## 2015-01-08 DIAGNOSIS — Z7982 Long term (current) use of aspirin: Secondary | ICD-10-CM

## 2015-01-08 DIAGNOSIS — C3491 Malignant neoplasm of unspecified part of right bronchus or lung: Secondary | ICD-10-CM | POA: Diagnosis present

## 2015-01-08 DIAGNOSIS — J45909 Unspecified asthma, uncomplicated: Secondary | ICD-10-CM | POA: Diagnosis present

## 2015-01-08 DIAGNOSIS — IMO0002 Reserved for concepts with insufficient information to code with codable children: Secondary | ICD-10-CM | POA: Diagnosis present

## 2015-01-08 DIAGNOSIS — B370 Candidal stomatitis: Secondary | ICD-10-CM

## 2015-01-08 DIAGNOSIS — K210 Gastro-esophageal reflux disease with esophagitis: Secondary | ICD-10-CM | POA: Diagnosis present

## 2015-01-08 DIAGNOSIS — I25810 Atherosclerosis of coronary artery bypass graft(s) without angina pectoris: Secondary | ICD-10-CM | POA: Diagnosis not present

## 2015-01-08 DIAGNOSIS — J9621 Acute and chronic respiratory failure with hypoxia: Secondary | ICD-10-CM | POA: Diagnosis present

## 2015-01-08 DIAGNOSIS — Z8673 Personal history of transient ischemic attack (TIA), and cerebral infarction without residual deficits: Secondary | ICD-10-CM

## 2015-01-08 DIAGNOSIS — J9611 Chronic respiratory failure with hypoxia: Secondary | ICD-10-CM | POA: Diagnosis present

## 2015-01-08 DIAGNOSIS — G629 Polyneuropathy, unspecified: Secondary | ICD-10-CM | POA: Diagnosis present

## 2015-01-08 DIAGNOSIS — I739 Peripheral vascular disease, unspecified: Secondary | ICD-10-CM | POA: Diagnosis present

## 2015-01-08 DIAGNOSIS — E43 Unspecified severe protein-calorie malnutrition: Secondary | ICD-10-CM | POA: Diagnosis present

## 2015-01-08 DIAGNOSIS — Z66 Do not resuscitate: Secondary | ICD-10-CM | POA: Diagnosis present

## 2015-01-08 DIAGNOSIS — G8929 Other chronic pain: Secondary | ICD-10-CM | POA: Diagnosis present

## 2015-01-08 DIAGNOSIS — J441 Chronic obstructive pulmonary disease with (acute) exacerbation: Principal | ICD-10-CM | POA: Diagnosis present

## 2015-01-08 DIAGNOSIS — F411 Generalized anxiety disorder: Secondary | ICD-10-CM | POA: Diagnosis present

## 2015-01-08 DIAGNOSIS — B3789 Other sites of candidiasis: Secondary | ICD-10-CM | POA: Diagnosis present

## 2015-01-08 DIAGNOSIS — I509 Heart failure, unspecified: Secondary | ICD-10-CM | POA: Diagnosis present

## 2015-01-08 DIAGNOSIS — F329 Major depressive disorder, single episode, unspecified: Secondary | ICD-10-CM | POA: Diagnosis present

## 2015-01-08 DIAGNOSIS — C801 Malignant (primary) neoplasm, unspecified: Secondary | ICD-10-CM

## 2015-01-08 DIAGNOSIS — Z79899 Other long term (current) drug therapy: Secondary | ICD-10-CM

## 2015-01-08 DIAGNOSIS — Z951 Presence of aortocoronary bypass graft: Secondary | ICD-10-CM

## 2015-01-08 DIAGNOSIS — R079 Chest pain, unspecified: Secondary | ICD-10-CM | POA: Diagnosis not present

## 2015-01-08 DIAGNOSIS — Z90710 Acquired absence of both cervix and uterus: Secondary | ICD-10-CM

## 2015-01-08 DIAGNOSIS — F17210 Nicotine dependence, cigarettes, uncomplicated: Secondary | ICD-10-CM | POA: Diagnosis present

## 2015-01-08 DIAGNOSIS — K219 Gastro-esophageal reflux disease without esophagitis: Secondary | ICD-10-CM | POA: Diagnosis present

## 2015-01-08 DIAGNOSIS — I10 Essential (primary) hypertension: Secondary | ICD-10-CM | POA: Diagnosis present

## 2015-01-08 DIAGNOSIS — Z923 Personal history of irradiation: Secondary | ICD-10-CM

## 2015-01-08 DIAGNOSIS — Z885 Allergy status to narcotic agent status: Secondary | ICD-10-CM

## 2015-01-08 DIAGNOSIS — I255 Ischemic cardiomyopathy: Secondary | ICD-10-CM | POA: Diagnosis not present

## 2015-01-08 DIAGNOSIS — E785 Hyperlipidemia, unspecified: Secondary | ICD-10-CM | POA: Diagnosis present

## 2015-01-08 DIAGNOSIS — Z888 Allergy status to other drugs, medicaments and biological substances status: Secondary | ICD-10-CM

## 2015-01-08 DIAGNOSIS — I252 Old myocardial infarction: Secondary | ICD-10-CM

## 2015-01-08 DIAGNOSIS — Z955 Presence of coronary angioplasty implant and graft: Secondary | ICD-10-CM

## 2015-01-08 DIAGNOSIS — Z9981 Dependence on supplemental oxygen: Secondary | ICD-10-CM

## 2015-01-08 DIAGNOSIS — Z823 Family history of stroke: Secondary | ICD-10-CM

## 2015-01-08 DIAGNOSIS — Z681 Body mass index (BMI) 19 or less, adult: Secondary | ICD-10-CM

## 2015-01-08 DIAGNOSIS — Z8249 Family history of ischemic heart disease and other diseases of the circulatory system: Secondary | ICD-10-CM

## 2015-01-08 HISTORY — DX: Dependence on supplemental oxygen: Z99.81

## 2015-01-08 LAB — CBC WITH DIFFERENTIAL/PLATELET
Basophils Absolute: 0 10*3/uL (ref 0.0–0.1)
Basophils Relative: 0 % (ref 0–1)
Eosinophils Absolute: 0.1 10*3/uL (ref 0.0–0.7)
Eosinophils Relative: 1 % (ref 0–5)
HCT: 37.2 % (ref 36.0–46.0)
HEMOGLOBIN: 12.6 g/dL (ref 12.0–15.0)
LYMPHS ABS: 0.9 10*3/uL (ref 0.7–4.0)
Lymphocytes Relative: 11 % — ABNORMAL LOW (ref 12–46)
MCH: 31.8 pg (ref 26.0–34.0)
MCHC: 33.9 g/dL (ref 30.0–36.0)
MCV: 93.9 fL (ref 78.0–100.0)
MONO ABS: 0.6 10*3/uL (ref 0.1–1.0)
Monocytes Relative: 8 % (ref 3–12)
Neutro Abs: 5.9 10*3/uL (ref 1.7–7.7)
Neutrophils Relative %: 80 % — ABNORMAL HIGH (ref 43–77)
Platelets: 181 10*3/uL (ref 150–400)
RBC: 3.96 MIL/uL (ref 3.87–5.11)
RDW: 12.6 % (ref 11.5–15.5)
WBC: 7.5 10*3/uL (ref 4.0–10.5)

## 2015-01-08 LAB — TROPONIN I
Troponin I: 0.03 ng/mL (ref <0.031)
Troponin I: <0.03 ng/mL (ref <0.031)

## 2015-01-08 LAB — BASIC METABOLIC PANEL
Anion gap: 10 (ref 5–15)
BUN: 6 mg/dL (ref 6–20)
CALCIUM: 9 mg/dL (ref 8.9–10.3)
CHLORIDE: 97 mmol/L — AB (ref 101–111)
CO2: 29 mmol/L (ref 22–32)
CREATININE: 0.48 mg/dL (ref 0.44–1.00)
GFR calc Af Amer: >60 mL/min (ref >60)
GFR calc non Af Amer: >60 mL/min (ref >60)
GLUCOSE: 117 mg/dL — AB (ref 65–99)
Potassium: 3.5 mmol/L (ref 3.5–5.1)
Sodium: 136 mmol/L (ref 135–145)

## 2015-01-08 LAB — URINALYSIS, ROUTINE W REFLEX MICROSCOPIC
BILIRUBIN URINE: NEGATIVE
Glucose, UA: NEGATIVE mg/dL
Ketones, ur: NEGATIVE mg/dL
Leukocytes, UA: NEGATIVE
NITRITE: NEGATIVE
PH: 7 (ref 5.0–8.0)
Protein, ur: NEGATIVE mg/dL
Specific Gravity, Urine: 1.004 — ABNORMAL LOW (ref 1.005–1.030)
Urobilinogen, UA: 0.2 mg/dL (ref 0.0–1.0)

## 2015-01-08 LAB — PROTIME-INR
INR: 1.2 (ref 0.00–1.49)
Prothrombin Time: 15.4 seconds — ABNORMAL HIGH (ref 11.6–15.2)

## 2015-01-08 LAB — I-STAT TROPONIN, ED: TROPONIN I, POC: 0.03 ng/mL (ref 0.00–0.08)

## 2015-01-08 LAB — BRAIN NATRIURETIC PEPTIDE: B Natriuretic Peptide: 261.2 pg/mL — ABNORMAL HIGH (ref 0.0–100.0)

## 2015-01-08 LAB — URINE MICROSCOPIC-ADD ON

## 2015-01-08 MED ORDER — ONDANSETRON HCL 4 MG PO TABS: 4.0000 mg | ORAL_TABLET | Freq: Four times a day (QID) | ORAL | Status: DC | PRN

## 2015-01-08 MED ORDER — ATORVASTATIN CALCIUM 80 MG PO TABS
80.0000 mg | ORAL_TABLET | Freq: Every day | ORAL | Status: DC
  Administered 2015-01-08 – 2015-01-09 (×2): 80 mg via ORAL
  Filled 2015-01-08 (×4): qty 1

## 2015-01-08 MED ORDER — ONDANSETRON HCL 4 MG/2ML IJ SOLN
4.0000 mg | Freq: Four times a day (QID) | INTRAMUSCULAR | Status: DC | PRN
  Administered 2015-01-08 – 2015-01-09 (×2): 4 mg via INTRAVENOUS
  Filled 2015-01-08 (×2): qty 2

## 2015-01-08 MED ORDER — NITROGLYCERIN 2 % TD OINT
1.0000 [in_us] | TOPICAL_OINTMENT | Freq: Once | TRANSDERMAL | Status: AC
  Administered 2015-01-08: 1 [in_us] via TOPICAL
  Filled 2015-01-08: qty 1

## 2015-01-08 MED ORDER — MORPHINE SULFATE (PF) 2 MG/ML IV SOLN
INTRAVENOUS | Status: AC
  Administered 2015-01-08: 2 mg via INTRAVENOUS
  Filled 2015-01-08: qty 1

## 2015-01-08 MED ORDER — ACETAMINOPHEN 650 MG RE SUPP: 650.0000 mg | Freq: Four times a day (QID) | RECTAL | Status: DC | PRN

## 2015-01-08 MED ORDER — ENOXAPARIN SODIUM 40 MG/0.4ML ~~LOC~~ SOLN
40.0000 mg | SUBCUTANEOUS | Status: DC
  Administered 2015-01-09: 40 mg via SUBCUTANEOUS
  Filled 2015-01-08 (×3): qty 0.4

## 2015-01-08 MED ORDER — ACETAMINOPHEN 325 MG RE SUPP: 975.0000 mg | Freq: Once | RECTAL | Status: DC

## 2015-01-08 MED ORDER — METHYLPREDNISOLONE SODIUM SUCC 40 MG IJ SOLR
40.0000 mg | Freq: Three times a day (TID) | INTRAMUSCULAR | Status: DC
  Administered 2015-01-08 – 2015-01-10 (×6): 40 mg via INTRAVENOUS
  Filled 2015-01-08 (×10): qty 1

## 2015-01-08 MED ORDER — ENOXAPARIN SODIUM 40 MG/0.4ML ~~LOC~~ SOLN: 40.0000 mg | SUBCUTANEOUS | Status: DC

## 2015-01-08 MED ORDER — BISACODYL 10 MG RE SUPP: 10.0000 mg | Freq: Every day | RECTAL | Status: DC | PRN

## 2015-01-08 MED ORDER — MORPHINE SULFATE (PF) 2 MG/ML IV SOLN
1.0000 mg | INTRAVENOUS | Status: DC | PRN
  Administered 2015-01-08 – 2015-01-10 (×5): 2 mg via INTRAVENOUS
  Filled 2015-01-08 (×5): qty 1

## 2015-01-08 MED ORDER — MORPHINE SULFATE (PF) 4 MG/ML IV SOLN
4.0000 mg | Freq: Once | INTRAVENOUS | Status: AC
  Administered 2015-01-08: 4 mg via INTRAVENOUS
  Filled 2015-01-08: qty 1

## 2015-01-08 MED ORDER — PANTOPRAZOLE SODIUM 40 MG PO TBEC
40.0000 mg | DELAYED_RELEASE_TABLET | Freq: Two times a day (BID) | ORAL | Status: DC
  Administered 2015-01-08 – 2015-01-10 (×5): 40 mg via ORAL
  Filled 2015-01-08 (×4): qty 1

## 2015-01-08 MED ORDER — TIOTROPIUM BROMIDE MONOHYDRATE 18 MCG IN CAPS
18.0000 ug | ORAL_CAPSULE | Freq: Every day | RESPIRATORY_TRACT | Status: DC
  Filled 2015-01-08: qty 5

## 2015-01-08 MED ORDER — IPRATROPIUM-ALBUTEROL 0.5-2.5 (3) MG/3ML IN SOLN
3.0000 mL | Freq: Four times a day (QID) | RESPIRATORY_TRACT | Status: DC
  Administered 2015-01-08 – 2015-01-10 (×7): 3 mL via RESPIRATORY_TRACT
  Filled 2015-01-08 (×8): qty 3

## 2015-01-08 MED ORDER — EZETIMIBE 10 MG PO TABS
10.0000 mg | ORAL_TABLET | Freq: Every day | ORAL | Status: DC
  Administered 2015-01-08 – 2015-01-10 (×3): 10 mg via ORAL
  Filled 2015-01-08 (×3): qty 1

## 2015-01-08 MED ORDER — ASPIRIN EC 81 MG PO TBEC
81.0000 mg | DELAYED_RELEASE_TABLET | Freq: Every day | ORAL | Status: DC
  Administered 2015-01-09 – 2015-01-10 (×2): 81 mg via ORAL
  Filled 2015-01-08 (×2): qty 1

## 2015-01-08 MED ORDER — SODIUM CHLORIDE 0.9 % IV BOLUS (SEPSIS): 1000.0000 mL | Freq: Once | INTRAVENOUS | Status: DC

## 2015-01-08 MED ORDER — BUDESONIDE-FORMOTEROL FUMARATE 160-4.5 MCG/ACT IN AERO
2.0000 | INHALATION_SPRAY | Freq: Two times a day (BID) | RESPIRATORY_TRACT | Status: DC
  Administered 2015-01-09 – 2015-01-10 (×3): 2 via RESPIRATORY_TRACT
  Filled 2015-01-08: qty 6

## 2015-01-08 MED ORDER — GI COCKTAIL ~~LOC~~
30.0000 mL | Freq: Four times a day (QID) | ORAL | Status: DC | PRN
  Filled 2015-01-08: qty 30

## 2015-01-08 MED ORDER — FLUCONAZOLE 100MG IVPB
100.0000 mg | INTRAVENOUS | Status: DC
  Administered 2015-01-08: 100 mg via INTRAVENOUS
  Filled 2015-01-08 (×2): qty 50

## 2015-01-08 MED ORDER — ENSURE ENLIVE PO LIQD
237.0000 mL | Freq: Two times a day (BID) | ORAL | Status: DC
  Administered 2015-01-09: 237 mL via ORAL

## 2015-01-08 MED ORDER — FLUTICASONE FUROATE-VILANTEROL 100-25 MCG/INH IN AEPB: 1.0000 | INHALATION_SPRAY | Freq: Two times a day (BID) | RESPIRATORY_TRACT | Status: DC

## 2015-01-08 MED ORDER — ONDANSETRON HCL 4 MG/2ML IJ SOLN
4.0000 mg | Freq: Once | INTRAMUSCULAR | Status: AC
  Administered 2015-01-08: 4 mg via INTRAVENOUS
  Filled 2015-01-08: qty 2

## 2015-01-08 MED ORDER — ACETAMINOPHEN 500 MG PO TABS
1000.0000 mg | ORAL_TABLET | Freq: Once | ORAL | Status: AC
  Administered 2015-01-08: 1000 mg via ORAL
  Filled 2015-01-08: qty 2

## 2015-01-08 MED ORDER — SODIUM CHLORIDE 0.9 % IJ SOLN
3.0000 mL | Freq: Two times a day (BID) | INTRAMUSCULAR | Status: DC
  Administered 2015-01-08 – 2015-01-10 (×5): 3 mL via INTRAVENOUS
  Filled 2015-01-08: qty 3

## 2015-01-08 MED ORDER — NITROGLYCERIN 0.4 MG SL SUBL: 0.4000 mg | SUBLINGUAL_TABLET | SUBLINGUAL | Status: DC | PRN

## 2015-01-08 MED ORDER — IPRATROPIUM-ALBUTEROL 0.5-2.5 (3) MG/3ML IN SOLN
3.0000 mL | Freq: Once | RESPIRATORY_TRACT | Status: AC
  Administered 2015-01-08: 3 mL via RESPIRATORY_TRACT
  Filled 2015-01-08: qty 3

## 2015-01-08 MED ORDER — PRASUGREL HCL 10 MG PO TABS
10.0000 mg | ORAL_TABLET | Freq: Every day | ORAL | Status: DC
  Filled 2015-01-08: qty 1

## 2015-01-08 MED ORDER — HYDROCODONE-ACETAMINOPHEN 10-325 MG PO TABS
1.0000 | ORAL_TABLET | Freq: Four times a day (QID) | ORAL | Status: DC | PRN
  Administered 2015-01-08 – 2015-01-09 (×2): 1 via ORAL
  Filled 2015-01-08 (×2): qty 1

## 2015-01-08 MED ORDER — ACETAMINOPHEN 325 MG PO TABS
650.0000 mg | ORAL_TABLET | Freq: Four times a day (QID) | ORAL | Status: DC | PRN
  Administered 2015-01-09: 650 mg via ORAL
  Filled 2015-01-08: qty 2

## 2015-01-08 MED ORDER — LORATADINE 10 MG PO TABS
10.0000 mg | ORAL_TABLET | Freq: Every day | ORAL | Status: DC | PRN
  Filled 2015-01-08: qty 1

## 2015-01-08 MED ORDER — LEVOFLOXACIN IN D5W 750 MG/150ML IV SOLN
750.0000 mg | INTRAVENOUS | Status: DC
  Administered 2015-01-08: 750 mg via INTRAVENOUS
  Filled 2015-01-08 (×2): qty 150

## 2015-01-08 NOTE — ED Notes (Signed)
Attempted report x1. 

## 2015-01-08 NOTE — H&P (Signed)
[facility] History and Physical  BRYA SIMERLY WPY:[id number] DOB: 1950-11-12 DOA: 01/08/2015  Referring physician:  PCP: Tivis Ringer, MD   Chief Complaint: Chest pain  HPI: [name] is a 64 y.o. female with a past medical history of stage III a non-small cell lung cancer diagnosed in September 2013 status post concurrent chemoradiation therapy, street of coronary artery disease ongoing tobacco abuse who presents to the emergency department with complaints of chest pain. She reported waking up this morning having chest discomfort located in the retrosternal region associated with throat burning. Her chest pain remain constant over the course of the morning. She also complains of associated dizziness, lightheadedness, feeling "swimmy headed" and nearly passing out in her bathroom. She also complains of increasing cough and shortness of breath over the last several days. Unfortunately she continues to smoke about a pack of cigarettes a day. She has a history of chronic hypoxemic respiratory failure and uses home oxygen.  Workup in the emergency department included a two-view chest x-ray that showed chronic obstructive pulmonary disease, no acute changes.                                                              Review of Systems:  Constitutional:  No weight loss, night sweats, Fevers, chills, positive for fatigue.  HEENT:  No headaches, Difficulty swallowing,Tooth/dental problems,Sore throat,  No sneezing, itching, ear ache, nasal congestion, post nasal drip,  Cardio-vascular:  Positive for chest pain, Orthopnea, PND, swelling in lower extremities, anasarca, dizziness, palpitations  GI:  No heartburn, indigestion, abdominal pain, nausea, vomiting, diarrhea, change in bowel habits, loss of appetite  Resp:  Positive for shortness of breath with exertion or at rest. No excess mucus, no productive cough, No non-productive cough, No coughing up of blood.No change in color of  mucus.No wheezing.No chest wall deformity  Skin:  no rash or lesions.  GU:  no dysuria, change in color of urine, no urgency or frequency. No flank pain.  Musculoskeletal:  No joint pain or swelling. No decreased range of motion. No back pain.  Psych:  No change in mood or affect. No depression or anxiety. No memory loss.   Past Medical History  Diagnosis Date  . Acute myocardial infarction, unspecified site, episode of care unspecified 2011    VF arrest. Ruled in, cath with no clear culprit lesion, inferior WMA on LV gram, medical therapy  . Esophageal dysmotility   . Hemorrhoids   . Esophageal stricture   . Diaphragmatic hernia without mention of obstruction or gangrene   . 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 CAD (coronary artery disease)     CABG'96., Xience DES -VG-RCA (01/12/13)  . Peripheral neuropathy   . Hyperlipidemia   . Peptic ulcer, unspecified site, unspecified as acute or chronic, without mention of hemorrhage, perforation, or obstruction   . Depressive disorder, not elsewhere classified   . Anxiety state, unspecified   . COPD (chronic obstructive pulmonary disease)   . GERD (gastroesophageal reflux disease)   . Candida esophagitis     on multiple occasions  . C. difficile diarrhea   . PVD (peripheral vascular disease)   . Headache(784.0)   . Weight loss 06/13/2011    scheduled here by family doctor  . CHF (congestive  heart failure) 2011  . Asthma   . Hypertension   . History of nuclear stress test 07/14/2010    dipyriadmole; normal pattern of perfusion, low risk   . History of tobacco use     quit 05/2009  . Non-small cell carcinoma of lung, stage 3     Dr. Roxan Hockey  . History of radiation therapy 02/28/2012-04/14/2012    right lung  . Bilateral carotid artery disease   . TIA (transient ischemic attack)     amaurosis fugax  . Tubular adenoma of colon    Past Surgical History  Procedure Laterality Date  . Tonsillectomy  1974  .  Fracture lt jaw  1976  . Breast surgery  1969, 1970    bilateral  . Epigastric hernia repair  2008    Dr Roxan Hockey  . Coronary artery bypass graft  02/01/1994    left internal mammary to LAD, vein graft to diagonal/post descending, post lateral, marginals, circumfle, diagonal  . Carpal tunnel release    . Total abdominal hysterectomy  1998  . Laminectomy    . Subclavian vein angioplasty / stenting Left   . Transthoracic echocardiogram  08/14/2009    EF=>55%, normal LV systolic function, mild hypokinesis of basal segments of inferolateral & anterolater walls; mild MR/TR; AV mildly sclerotic  . Coronary angioplasty with stent placement  08/29/2000    stent to RCA (Dr. Corky Downs)  . Cardiac catheterization  01/17/2002    patent grafts (Dr. Marella Chimes)  . Cardiac catheterization  02/13/2004    significant L main & 3 vessel CAD, patents grafts, mild distal aortic disease (Dr. Gerrie Nordmann)  . Cardiac catheterization  07/14/2005    patent grafts, normal LV function (Dr. Adora Fridge)  . Cardiac catheterization  09/26/2008    patent grafts, normal LV function (Dr. Adora Fridge)  . Cardiac catheterization  05/31/2009    patent grafts, inferior wall motion abnormality (Dr. Adora Fridge)  . Cardiac catheterization  10/03/2009    normal LV function; 100% native RCA, circumflex, distal LAD occlusion  . Coronary angioplasty with stent placement  01/12/2013    Xience DES to SVG-RCA distal  . Carotid doppler  08/14/2009    R & L ICAs - 0-49% diameter reduction   . Left heart catheterization with coronary/graft angiogram N/A 01/12/2013    Procedure: LEFT HEART CATHETERIZATION WITH Beatrix Fetters;  Surgeon: Leonie Man, MD;  Location: [facility] C[facility];  Service: Cardiovascular;  Laterality: N/A;   Social History:  reports that she has been smoking Cigarettes.  She has a 45 pack-year smoking history. She has never used smokeless tobacco. She reports that she does not drink alcohol or use illicit drugs.  Allergies    Allergen Reactions  . Amiodarone Other (See Comments)    Drops HR too low, SOB, fatigue  . Clopidogrel Bisulfate Hives  . Dilaudid [Hydromorphone Hcl] Other (See Comments)    "I couldn't function for 2 days."  . Multaq [Dronedarone] Other (See Comments)    Heart races     Family History  Problem Relation Age of Onset  . Lung cancer Father     also CVA  . Heart disease Father   . Asthma Father   . Allergies Father   . Cancer Father   . Heart disease Mother     also CVA, MI  . Brain cancer Mother   . Heart disease Sister   . Colon cancer Neg Hx   . Lung cancer Brother 38  also ENT cancer  . Hypertension Brother 33    also aneurysm, early CAD  . Breast cancer Other     family hx    Prior to Admission medications   Medication Sig Start Date End Date Taking? Authorizing Provider  aspirin EC 81 MG tablet Take 81 mg by mouth daily.   Yes Historical Provider, MD  atorvastatin (LIPITOR) 80 MG tablet Take 80 mg by mouth at bedtime.   Yes Historical Provider, MD  diazepam (VALIUM) 5 MG tablet Take 5 mg by mouth 2 (two) times daily as needed for anxiety.  10/31/14  Yes Historical Provider, MD  famotidine (PEPCID) 20 MG tablet Take 1 tablet (20 mg total) by mouth at bedtime. 12/11/14  Yes Lafayette Dragon, MD  Fluticasone Furoate-Vilanterol (BREO ELLIPTA) 100-25 MCG/INH AEPB Inhale 1 puff into the lungs 2 (two) times daily.   Yes Historical Provider, MD  HYDROcodone-acetaminophen (NORCO) 10-325 MG per tablet Take 1-2 tablets by mouth every 6 (six) hours as needed for moderate pain.   Yes Historical Provider, MD  loratadine (CLARITIN) 10 MG tablet Take 1 tablet (10 mg total) by mouth daily as needed for allergies. 03/05/14  Yes Ripudeep Krystal Eaton, MD  nitroGLYCERIN (NITROSTAT) 0.4 MG SL tablet Place 0.4 mg under the tongue every 5 (five) minutes as needed for chest pain.    Yes Historical Provider, MD  omeprazole (PRILOSEC) 20 MG capsule Take 20 mg by mouth 2 (two) times daily before a meal.    Yes Historical Provider, MD  ondansetron (ZOFRAN) 4 MG tablet Take 1 tablet (4 mg total) by mouth every 8 (eight) hours as needed for nausea or vomiting. 09/07/14  Yes Tori Milks, MD  OXYGEN-HELIUM IN Inhale 4 L into the lungs continuous.   Yes Historical Provider, MD  prasugrel (EFFIENT) 10 MG TABS tablet Take 10 mg by mouth daily.   Yes Historical Provider, MD  PROAIR HFA 108 (90 BASE) MCG/ACT inhaler INHALE 2 PUFFS INTO THE LUNGS EVERY 6 (SIX) HOURS AS NEEDED FOR WHEEZING OR SHORTNESS OF BREATH. 12/23/14  Yes Chesley Mires, MD  SPIRIVA HANDIHALER 18 MCG inhalation capsule PLACE 1 CAPSULE (18 MCG TOTAL) INTO INHALER AND INHALE DAILY. 10/28/14  Yes Noralee Space, MD  SYMBICORT 160-4.5 MCG/ACT inhaler INHALE 2 PUFFS INTO THE LUNGS TWICE A DAY THEN RINSE AFTER DOSING 08/23/14  Yes Chesley Mires, MD  ZETIA 10 MG tablet TAKE 1 TABLET BY MOUTH EVERY DAY 03/25/14  Yes Lorretta Harp, MD  albuterol (PROVENTIL) (2.5 MG/3ML) 0.083% nebulizer solution Take 3 mLs (2.5 mg total) by nebulization every 6 (six) hours as needed for wheezing or shortness of breath. Patient not taking: Reported on 01/08/2015 09/17/14   Chesley Mires, MD  escitalopram (LEXAPRO) 10 MG tablet Take 1 tablet (10 mg total) by mouth daily. Patient not taking: Reported on 01/08/2015 10/17/14   Lorretta Harp, MD  fluticasone [facility]) 50 MCG/ACT nasal spray Place 1 spray into both nostrils daily as needed for allergies or rhinitis. Patient not taking: Reported on 01/08/2015 03/05/14   Ripudeep Krystal Eaton, MD  nortriptyline (PAMELOR) 25 MG capsule Take 1 capsule (25 mg total) by mouth at bedtime. Patient not taking: Reported on 01/08/2015 09/24/14   Britt Bottom, MD   Physical Exam: Filed Vitals:   01/08/15 1130 01/08/15 1145 01/08/15 1200 01/08/15 1215  BP: 125/81 140/81 145/71 139/60  Pulse: 104 104 107 95  Temp:      TempSrc:      Resp: 20 18  28 22  SpO2: 100% 99% 100% 100%    Wt Readings from Last 3 Encounters:  11/22/14 42.638 kg (94 lb)    11/18/14 41.459 kg (91 lb 6.4 oz)  09/24/14 42.638 kg (94 lb)    General: Chronically ill-appearing, cachectic, thin, wasted, appearing older than stated age Eyes: PERRL, normal lids, irises & conjunctiva ENT: grossly normal hearing, lips & tongue, positive thrush Neck: no LAD, masses or thyromegaly Cardiovascular: RRR, no m/r/g. No LE edema. Telemetry: SR, no arrhythmias  Respiratory: Diminished breath sounds bilaterally with scattered expiratory wheezes, no crackles or rhonchi, on supplemental oxygen Abdomen: soft, ntnd Skin: no rash or induration seen on limited exam Musculoskeletal: Significant bilateral muscle atrophy Psychiatric: grossly normal mood and affect, speech fluent and appropriate Neurologic: grossly non-focal.          Labs on Admission:  Basic Metabolic Panel:  Recent Labs Lab 01/08/15 0918  NA 136  K 3.5  CL 97*  CO2 29  GLUCOSE 117*  BUN 6  CREATININE 0.48  CALCIUM 9.0   Liver Function Tests: No results for input(s): AST, ALT, ALKPHOS, BILITOT, PROT, ALBUMIN in the last 168 hours. No results for input(s): LIPASE, AMYLASE in the last 168 hours. No results for input(s): AMMONIA in the last 168 hours. CBC:  Recent Labs Lab 01/08/15 0918  WBC 7.5  NEUTROABS 5.9  HGB 12.6  HCT 37.2  MCV 93.9  PLT 181   Cardiac Enzymes: No results for input(s): CKTOTAL, CKMB, CKMBINDEX, TROPONINI in the last 168 hours.  BNP (last 3 results)  Recent Labs  06/23/14 1523 09/27/14 1323 01/08/15 0918  BNP 58.5 199.1* 261.2*    ProBNP (last 3 results)  Recent Labs  03/03/14 1147  PROBNP 475.8*    CBG: No results for input(s): GLUCAP in the last 168 hours.  Radiological Exams on Admission: Dg Chest 2 View  01/08/2015   CLINICAL DATA:  Shortness of breath.  Weakness today.  EXAM: CHEST  2 VIEW  COMPARISON:  09/27/2014.  Chest CT 11/14/2014  FINDINGS: There is hyperinflation of the lungs compatible with COPD. Heart and mediastinal contours are within  normal limits. No focal opacities or effusions. No acute bony abnormality.  IMPRESSION: COPD.  No active disease.   Electronically Signed   By: Rolm Baptise M.D.   On: 01/08/2015 10:35    EKG: Independently reviewed.   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 EF ~45%   CAD (coronary artery disease), autologous vein bypass graft, cath 01/12/13 with Xience DES to VG-RCA   Chronic respiratory failure with hypoxia   COPD exacerbation   Acute and chronic respiratory failure with hypoxia   1. Chest pain. Patient presenting with chest pain having atypical features. She reported waking up to retrosternal chest pain this morning that was associate with increasing shortness of breath, cough, along with "throat burning" workup in the emergency department has been unremarkable having troponin of 0.03, BNP of 261.2, chest x-ray showing no acute cardiopulmonary changes. Will place patient on continuous cardiac monitoring and cycle troponins overnight. Will further workup with a transthoracic echocardiogram. Chest pain could be related to COPD exacerbation or perhaps to oropharyngeal candidiasis.  2. Suspected COPD exacerbation. Patient presenting with complaints of increasing cough associate with shortness of breath and chest pain. On lung exam had respiratory wheezing. Will treat with systemic steroids,  Solu-Medrol 40 mg IV every 8 hours along with empiric antibiotic therapy with Levaquin. Will scheduled duo nebs every 4 hours. 3. Oropharyngeal candidiasis. On exam patient having thrush, will treat with Diflucan 100 mg IV every 24 hours. 4. History of ischemic heart and myopathy. Her last transthoracic echocardiogram was performed on 06/24/2014 at which time she had an ejection fraction 55-60%. She presents with chest discomfort, will obtain transthoracic  echocardiogram at this time. 5. Severe protein calorie malnutrition. Patient having a weight of 42 kg. She has a history of lung cancer. Will provide protein boost. 6. History of lung cancer. Patient having a history of stage IIIa non-small cell lung cancer status post concurrent chemoradiation therapy. She currently follows Dr. Julien Nordmann at the cancer center. Her last appointment was on 11/17/2004 time. Most recent CT scan of lungs performed on 11/14/2014 showed stable 8 mm spiculated right upper lobe nodule, without evidence of disease progression. 7. Established history of coronary artery disease. Will continue ASA, eEffient, and statin. Cycling of troponins, admitting to telemetry. 8. DVT prophylaxis. Lovenox     Code Status: DNR Family Communication: Spoke with family present at bedside Disposition Plan: Will place in a 24-hour, do not anticipate her requiring greater than 2 nights hospitalization  Time spent: 55 min  Kelvin Cellar [facility] Pager [phone]

## 2015-01-08 NOTE — ED Provider Notes (Signed)
CSN: [id number]     Arrival date & time 01/08/15  0630 History   First MD Initiated Contact with Patient 01/08/15 [id number]     Chief Complaint  Patient presents with  . Chest Pain     (Consider location/radiation/quality/duration/timing/severity/associated sxs/prior Treatment) HPI Comments: Patient is a 64 yo F PMHx significant for MI, h/o lung cancer, CAD, HLD, COPD on 4L, GERD, CHF, Asthma, HTN, TIA presenting to the ED from home with acute onset central chest pain with radiation to neck and jaw that began while brushing her teeth this morning. Patient states she feels mild worsening of her chronic shortness of breath, and also had associated dizziness w/o LOC. Patient states this feels like her previous MI with CABG back in 1999. CP improved with ASA and SL, not resolved, but now has a headache. Patient is a DNR would receive medical treatment for NSTEMI.   Patient is a 64 y.o. female presenting with chest pain.  Chest Pain Associated symptoms: headache and shortness of breath   Associated symptoms: no fever     Past Medical History  Diagnosis Date  . Acute myocardial infarction, unspecified site, episode of care unspecified 2011    VF arrest. Ruled in, cath with no clear culprit lesion, inferior WMA on LV gram, medical therapy  . Esophageal dysmotility   . Hemorrhoids   . Esophageal stricture   . Diaphragmatic hernia without mention of obstruction or gangrene   . 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 CAD (coronary artery disease)     CABG'96., Xience DES -VG-RCA (01/12/13)  . Peripheral neuropathy   . Hyperlipidemia   . Peptic ulcer, unspecified site, unspecified as acute or chronic, without mention of hemorrhage, perforation, or obstruction   . Depressive disorder, not elsewhere classified   . Anxiety state, unspecified   . COPD (chronic obstructive pulmonary disease)   . GERD (gastroesophageal reflux disease)   . Candida esophagitis     on multiple occasions  .  C. difficile diarrhea   . PVD (peripheral vascular disease)   . Headache(784.0)   . Weight loss 06/13/2011    scheduled here by family doctor  . CHF (congestive heart failure) 2011  . Asthma   . Hypertension   . History of nuclear stress test 07/14/2010    dipyriadmole; normal pattern of perfusion, low risk   . History of tobacco use     quit 05/2009  . Non-small cell carcinoma of lung, stage 3     Dr. Roxan Hockey  . History of radiation therapy 02/28/2012-04/14/2012    right lung  . Bilateral carotid artery disease   . TIA (transient ischemic attack)     amaurosis fugax  . Tubular adenoma of colon    Past Surgical History  Procedure Laterality Date  . Tonsillectomy  1974  . Fracture lt jaw  1976  . Breast surgery  1969, 1970    bilateral  . Epigastric hernia repair  2008    Dr Roxan Hockey  . Coronary artery bypass graft  02/01/1994    left internal mammary to LAD, vein graft to diagonal/post descending, post lateral, marginals, circumfle, diagonal  . Carpal tunnel release    . Total abdominal hysterectomy  1998  . Laminectomy    . Subclavian vein angioplasty / stenting Left   . Transthoracic echocardiogram  08/14/2009    EF=>55%, normal LV systolic function, mild hypokinesis of basal segments of inferolateral & anterolater walls; mild MR/TR; AV mildly sclerotic  . Coronary angioplasty  with stent placement  08/29/2000    stent to RCA (Dr. Corky Downs)  . Cardiac catheterization  01/17/2002    patent grafts (Dr. Marella Chimes)  . Cardiac catheterization  02/13/2004    significant L main & 3 vessel CAD, patents grafts, mild distal aortic disease (Dr. Gerrie Nordmann)  . Cardiac catheterization  07/14/2005    patent grafts, normal LV function (Dr. Adora Fridge)  . Cardiac catheterization  09/26/2008    patent grafts, normal LV function (Dr. Adora Fridge)  . Cardiac catheterization  05/31/2009    patent grafts, inferior wall motion abnormality (Dr. Adora Fridge)  . Cardiac catheterization  10/03/2009     normal LV function; 100% native RCA, circumflex, distal LAD occlusion  . Coronary angioplasty with stent placement  01/12/2013    Xience DES to SVG-RCA distal  . Carotid doppler  08/14/2009    R & L ICAs - 0-49% diameter reduction   . Left heart catheterization with coronary/graft angiogram N/A 01/12/2013    Procedure: LEFT HEART CATHETERIZATION WITH Beatrix Fetters;  Surgeon: Leonie Man, MD;  Location: [facility] CATH LAB;  Service: Cardiovascular;  Laterality: N/A;   Family History  Problem Relation Age of Onset  . Lung cancer Father     also CVA  . Heart disease Father   . Asthma Father   . Allergies Father   . Cancer Father   . Heart disease Mother     also CVA, MI  . Brain cancer Mother   . Heart disease Sister   . Colon cancer Neg Hx   . Lung cancer Brother 78    also ENT cancer  . Hypertension Brother 46    also aneurysm, early CAD  . Breast cancer Other     family hx   Social History  Substance Use Topics  . Smoking status: Current Every Day Smoker -- 1.50 packs/day for 30 years    Types: Cigarettes    Last Attempt to Quit: 05/24/2013  . Smokeless tobacco: Never Used  . Alcohol Use: No   OB History    No data available     Review of Systems  Constitutional: Negative for fever.  Respiratory: Positive for shortness of breath.   Cardiovascular: Positive for chest pain.  Neurological: Positive for headaches.  All other systems reviewed and are negative.     Allergies  Amiodarone; Clopidogrel bisulfate; Dilaudid; and Multaq  Home Medications   Prior to Admission medications   Medication Sig Start Date End Date Taking? Authorizing Provider  aspirin EC 81 MG tablet Take 81 mg by mouth daily.   Yes Historical Provider, MD  atorvastatin (LIPITOR) 80 MG tablet Take 80 mg by mouth at bedtime.   Yes Historical Provider, MD  diazepam (VALIUM) 5 MG tablet Take 5 mg by mouth 2 (two) times daily as needed for anxiety.  10/31/14  Yes Historical Provider, MD   famotidine (PEPCID) 20 MG tablet Take 1 tablet (20 mg total) by mouth at bedtime. 12/11/14  Yes Lafayette Dragon, MD  Fluticasone Furoate-Vilanterol (BREO ELLIPTA) 100-25 MCG/INH AEPB Inhale 1 puff into the lungs 2 (two) times daily.   Yes Historical Provider, MD  HYDROcodone-acetaminophen (NORCO) 10-325 MG per tablet Take 1-2 tablets by mouth every 6 (six) hours as needed for moderate pain.   Yes Historical Provider, MD  loratadine (CLARITIN) 10 MG tablet Take 1 tablet (10 mg total) by mouth daily as needed for allergies. 03/05/14  Yes Ripudeep Krystal Eaton, MD  nitroGLYCERIN (NITROSTAT) 0.4  MG SL tablet Place 0.4 mg under the tongue every 5 (five) minutes as needed for chest pain.    Yes Historical Provider, MD  omeprazole (PRILOSEC) 20 MG capsule Take 20 mg by mouth 2 (two) times daily before a meal.   Yes Historical Provider, MD  ondansetron (ZOFRAN) 4 MG tablet Take 1 tablet (4 mg total) by mouth every 8 (eight) hours as needed for nausea or vomiting. 09/07/14  Yes Tori Milks, MD  OXYGEN-HELIUM IN Inhale 4 L into the lungs continuous.   Yes Historical Provider, MD  prasugrel (EFFIENT) 10 MG TABS tablet Take 10 mg by mouth daily.   Yes Historical Provider, MD  PROAIR HFA 108 (90 BASE) MCG/ACT inhaler INHALE 2 PUFFS INTO THE LUNGS EVERY 6 (SIX) HOURS AS NEEDED FOR WHEEZING OR SHORTNESS OF BREATH. 12/23/14  Yes Chesley Mires, MD  SPIRIVA HANDIHALER 18 MCG inhalation capsule PLACE 1 CAPSULE (18 MCG TOTAL) INTO INHALER AND INHALE DAILY. 10/28/14  Yes Noralee Space, MD  SYMBICORT 160-4.5 MCG/ACT inhaler INHALE 2 PUFFS INTO THE LUNGS TWICE A DAY THEN RINSE AFTER DOSING 08/23/14  Yes Chesley Mires, MD  ZETIA 10 MG tablet TAKE 1 TABLET BY MOUTH EVERY DAY 03/25/14  Yes Lorretta Harp, MD  albuterol (PROVENTIL) (2.5 MG/3ML) 0.083% nebulizer solution Take 3 mLs (2.5 mg total) by nebulization every 6 (six) hours as needed for wheezing or shortness of breath. Patient not taking: Reported on 01/08/2015 09/17/14   Chesley Mires, MD   escitalopram (LEXAPRO) 10 MG tablet Take 1 tablet (10 mg total) by mouth daily. Patient not taking: Reported on 01/08/2015 10/17/14   Lorretta Harp, MD  fluticasone [facility]) 50 MCG/ACT nasal spray Place 1 spray into both nostrils daily as needed for allergies or rhinitis. Patient not taking: Reported on 01/08/2015 03/05/14   Ripudeep Krystal Eaton, MD  nortriptyline (PAMELOR) 25 MG capsule Take 1 capsule (25 mg total) by mouth at bedtime. Patient not taking: Reported on 01/08/2015 09/24/14   Britt Bottom, MD   BP 92/69 mmHg  Pulse 58  Temp(Src) 98.1 F (36.7 C) (Oral)  Resp 21  SpO2 98% Physical Exam  Constitutional: She is oriented to person, place, and time. She appears cachectic. Nasal cannula in place.  HENT:  Head: Normocephalic and atraumatic.  Mouth/Throat: Mucous membranes are dry.  Eyes: Conjunctivae are normal.  Neck: Neck supple.  Cardiovascular: Normal pulses.  An irregularly irregular rhythm present. Tachycardia present.   Pulmonary/Chest: Accessory muscle usage present. Tachypnea noted. She has rhonchi.  Abdominal: Soft.  Musculoskeletal: She exhibits no edema.  Neurological: She is alert and oriented to person, place, and time.  Skin: Skin is warm and dry.  Nursing note and vitals reviewed.   ED Course  Procedures (including critical care time) Medications  aspirin EC tablet 81 mg (81 mg Oral Not Given 01/08/15 1414)  Fluticasone Furoate-Vilanterol 100-25 MCG/INH AEPB 1 puff (not administered)  HYDROcodone-acetaminophen (NORCO) 10-325 MG per tablet 1-2 tablet (1 tablet Oral Given 01/08/15 1317)  prasugrel (EFFIENT) tablet 10 mg (not administered)  tiotropium (SPIRIVA) inhalation capsule 18 mcg (not administered)  budesonide-formoterol (SYMBICORT) 160-4.5 MCG/ACT inhaler 2 puff (not administered)  ezetimibe (ZETIA) tablet 10 mg (not administered)  atorvastatin (LIPITOR) tablet 80 mg (not administered)  loratadine (CLARITIN) tablet 10 mg (not administered)   methylPREDNISolone sodium succinate (SOLU-MEDROL) 40 mg/mL injection 40 mg (40 mg Intravenous Given 01/08/15 1432)  ipratropium-albuterol (DUONEB) 0.5-2.5 (3) MG/3ML nebulizer solution 3 mL (not administered)  levofloxacin (LEVAQUIN) IVPB 750 mg (  750 mg Intravenous New Bag/Given 01/08/15 1432)  fluconazole (DIFLUCAN) IVPB 100 mg (not administered)  enoxaparin (LOVENOX) injection 40 mg (not administered)  sodium chloride 0.9 % injection 3 mL (not administered)  acetaminophen (TYLENOL) tablet 650 mg (not administered)    Or  acetaminophen (TYLENOL) suppository 650 mg (not administered)  ondansetron (ZOFRAN) tablet 4 mg (not administered)    Or  ondansetron (ZOFRAN) injection 4 mg (not administered)  bisacodyl (DULCOLAX) suppository 10 mg (not administered)  gi cocktail (Maalox,Lidocaine,Donnatal) (not administered)  pantoprazole (PROTONIX) EC tablet 40 mg (40 mg Oral Given 01/08/15 1432)  ipratropium-albuterol (DUONEB) 0.5-2.5 (3) MG/3ML nebulizer solution 3 mL (3 mLs Nebulization Given 01/08/15 0925)  acetaminophen (TYLENOL) tablet 1,000 mg (1,000 mg Oral Given 01/08/15 0934)  morphine 4 MG/ML injection 4 mg (4 mg Intravenous Given 01/08/15 1111)  ondansetron (ZOFRAN) injection 4 mg (4 mg Intravenous Given 01/08/15 1111)  nitroGLYCERIN (NITROGLYN) 2 % ointment 1 inch (1 inch Topical Given 01/08/15 1111)  ipratropium-albuterol (DUONEB) 0.5-2.5 (3) MG/3ML nebulizer solution 3 mL (3 mLs Nebulization Given 01/08/15 1111)    Labs Review Labs Reviewed  CBC WITH DIFFERENTIAL/PLATELET - Abnormal; Notable for the following:    Neutrophils Relative % 80 (*)    Lymphocytes Relative 11 (*)    All other components within normal limits  BASIC METABOLIC PANEL - Abnormal; Notable for the following:    Chloride 97 (*)    Glucose, Bld 117 (*)    All other components within normal limits  URINALYSIS, ROUTINE W REFLEX MICROSCOPIC (NOT AT [facility]) - Abnormal; Notable for the following:    Specific Gravity, Urine  1.004 (*)    Hgb urine dipstick TRACE (*)    All other components within normal limits  BRAIN NATRIURETIC PEPTIDE - Abnormal; Notable for the following:    B Natriuretic Peptide 261.2 (*)    All other components within normal limits  PROTIME-INR - Abnormal; Notable for the following:    Prothrombin Time 15.4 (*)    All other components within normal limits  URINE CULTURE  URINE MICROSCOPIC-ADD ON  TROPONIN I  TROPONIN I  TROPONIN I  Randolm Idol, ED    Imaging Review Dg Chest 2 View  01/08/2015   CLINICAL DATA:  Shortness of breath.  Weakness today.  EXAM: CHEST  2 VIEW  COMPARISON:  09/27/2014.  Chest CT 11/14/2014  FINDINGS: There is hyperinflation of the lungs compatible with COPD. Heart and mediastinal contours are within normal limits. No focal opacities or effusions. No acute bony abnormality.  IMPRESSION: COPD.  No active disease.   Electronically Signed   By: Rolm Baptise M.D.   On: 01/08/2015 10:35   I have personally reviewed and evaluated these images and lab results as part of my medical decision-making.   EKG Interpretation None      10:46 AM on reevaluation patient feeling better headache and chest pain eased off but not entirely resolved. States she is feels like she is breathing better. Right-sided lung fields with expiratory wheeze, rhonchi have resolved, left-sided clear to auscultation bilaterally. Will order more medication for headache and chest pain will also treat with another DuoNeb. Discussed lab and x-ray results with patient who is agreeable to plan for observation for chest pain rule out.  MDM   Final diagnoses:  Acute chest pain  COPD exacerbation  Chest pain    Filed Vitals:   01/08/15 1400  BP: 92/69  Pulse: 58  Temp:   Resp: 21     Afebrile,  NAD, non-toxic appearing, AAOx4.   Concern for cardiac etiology of Chest Pain. [facility] has been consulted and will see patient in the ED for likely admit. Pt does not meet criteria for CP protocol  and a further evaluation is recommended. Pt has been re-evaluated prior to consult and VSS, NAD, heart RRR, pain improved, lungs exam improved after duoneb administration, likely component of COPD exacerbation as well. No acute abnormalities found on EKG and first round of cardiac enzymes negative. This case was discussed with Dr. Sabra Heck who has seen the patient and agrees with plan to admit.    Baron Sane, PA-C 01/08/15 [place], MD 01/08/15 [phone]

## 2015-01-08 NOTE — ED Notes (Signed)
Placed patient on bedpan; family stepped outside of room; pt now resting, no needs at this time

## 2015-01-08 NOTE — ED Notes (Signed)
Pt undressed, in gown, on monitor, continuous pulse oximetry, blood pressure cuff and oxygen [name] (4L)

## 2015-01-08 NOTE — ED Provider Notes (Signed)
The patient is a 64 year old female, she has a significant history for severe COPD, lung cancer, severe coronary disease with prior median sternotomy and CABG. She presents with recurrent chest pain, radiation to the jaw and the teeth, similar to prior myocardial infarction, occurred this morning we'll she was brushing her teeth. On exam the patient has tachypnea, prolonged expiratory phase, using accessory muscles, patient states she is chronically short of breath and that she feels slightly more short of breath today this is somewhat baseline. She has no peripheral edema, she is in a tachycardia with frequent PVCs, EKG shows no acute ischemia otherwise. The patient has DO NOT RESUSCITATE orders but states that she would receive medical treatment for myocardial infarction if this was the case. I anticipate that the patient will be admitted to the hospital for further evaluation. Labs pending. We'll treat with albuterol, aspirin  ED ECG REPORT  I personally interpreted this EKG   Date: 01/08/2015   Rate: 101  Rhythm: sinus tachycardia  QRS Axis: normal  Intervals: normal  ST/T Wave abnormalities: nonspecific T wave changes  Conduction Disutrbances:none  Narrative Interpretation:  Frequent PVC's  Old EKG Reviewed: none available   Medical screening examination/treatment/procedure(s) were conducted as a shared visit with non-physician practitioner(s) and myself.  I personally evaluated the patient during the encounter.  Clinical Impression:   Final diagnoses:  Acute chest pain  COPD exacerbation  Chest pain         Noemi Chapel, MD 01/08/15 [id number]

## 2015-01-08 NOTE — ED Notes (Signed)
Pt. Complaint of chronic back pain 8/10. Clarified with pt. That chest pain has resolved at this time, pt. Denies CP at this time. Pt. States that pain was only this AM. Dr. Coralyn Pear aware, pt. Ok to go to tele bed for observation.

## 2015-01-08 NOTE — Progress Notes (Signed)
Report received from ED at 1615 and pt arrived to the unit at 1700. Pt oriented to the unit and room, VSS, telemetry applied and verified; pt c/o CP and back pain, MD paged and new order received for Morphine administered effective; pt skin intact with no pressure ulcer or wounds noted. Pt resting comfortably in bed with call light within reach and daughter at bedside. Reported off to oncoming RN. Francis Gaines Deneene Tarver RN.

## 2015-01-08 NOTE — ED Notes (Signed)
Pt. Presents to the ED with complaint of new onset CP starting this AM when leaning over the sink. Pt. On 4L O2 chronically, significant medical hx including lung cancer, COPD, asthma, HTN, CHF, 9 way heart bypass. Pt. States she felt dizzy/lightheaded with increased SOB. Pt. Complaint of HA 9/10. Pt. Received 1 Nitro and 324 ASA in route. Pt. With hx of afib, rate 90-110 per EMS. Pt. Is DNR with paperwork at bedside.

## 2015-01-09 ENCOUNTER — Observation Stay (HOSPITAL_COMMUNITY): Payer: Medicaid Other

## 2015-01-09 DIAGNOSIS — J9621 Acute and chronic respiratory failure with hypoxia: Secondary | ICD-10-CM

## 2015-01-09 DIAGNOSIS — Z888 Allergy status to other drugs, medicaments and biological substances status: Secondary | ICD-10-CM | POA: Diagnosis not present

## 2015-01-09 DIAGNOSIS — J45909 Unspecified asthma, uncomplicated: Secondary | ICD-10-CM | POA: Diagnosis present

## 2015-01-09 DIAGNOSIS — Z66 Do not resuscitate: Secondary | ICD-10-CM | POA: Diagnosis present

## 2015-01-09 DIAGNOSIS — Z823 Family history of stroke: Secondary | ICD-10-CM | POA: Diagnosis not present

## 2015-01-09 DIAGNOSIS — I255 Ischemic cardiomyopathy: Secondary | ICD-10-CM | POA: Diagnosis present

## 2015-01-09 DIAGNOSIS — Z801 Family history of malignant neoplasm of trachea, bronchus and lung: Secondary | ICD-10-CM | POA: Diagnosis not present

## 2015-01-09 DIAGNOSIS — Z923 Personal history of irradiation: Secondary | ICD-10-CM | POA: Diagnosis not present

## 2015-01-09 DIAGNOSIS — G629 Polyneuropathy, unspecified: Secondary | ICD-10-CM | POA: Diagnosis present

## 2015-01-09 DIAGNOSIS — I25810 Atherosclerosis of coronary artery bypass graft(s) without angina pectoris: Secondary | ICD-10-CM | POA: Diagnosis present

## 2015-01-09 DIAGNOSIS — J441 Chronic obstructive pulmonary disease with (acute) exacerbation: Secondary | ICD-10-CM | POA: Diagnosis present

## 2015-01-09 DIAGNOSIS — Z9981 Dependence on supplemental oxygen: Secondary | ICD-10-CM | POA: Diagnosis not present

## 2015-01-09 DIAGNOSIS — F411 Generalized anxiety disorder: Secondary | ICD-10-CM | POA: Diagnosis present

## 2015-01-09 DIAGNOSIS — I739 Peripheral vascular disease, unspecified: Secondary | ICD-10-CM | POA: Diagnosis present

## 2015-01-09 DIAGNOSIS — C3491 Malignant neoplasm of unspecified part of right bronchus or lung: Secondary | ICD-10-CM | POA: Diagnosis present

## 2015-01-09 DIAGNOSIS — B370 Candidal stomatitis: Secondary | ICD-10-CM | POA: Diagnosis present

## 2015-01-09 DIAGNOSIS — Z7982 Long term (current) use of aspirin: Secondary | ICD-10-CM | POA: Diagnosis not present

## 2015-01-09 DIAGNOSIS — I10 Essential (primary) hypertension: Secondary | ICD-10-CM | POA: Diagnosis present

## 2015-01-09 DIAGNOSIS — Z8673 Personal history of transient ischemic attack (TIA), and cerebral infarction without residual deficits: Secondary | ICD-10-CM | POA: Diagnosis not present

## 2015-01-09 DIAGNOSIS — Z95820 Peripheral vascular angioplasty status with implants and grafts: Secondary | ICD-10-CM | POA: Diagnosis not present

## 2015-01-09 DIAGNOSIS — I509 Heart failure, unspecified: Secondary | ICD-10-CM | POA: Diagnosis present

## 2015-01-09 DIAGNOSIS — E43 Unspecified severe protein-calorie malnutrition: Secondary | ICD-10-CM | POA: Diagnosis present

## 2015-01-09 DIAGNOSIS — R079 Chest pain, unspecified: Secondary | ICD-10-CM

## 2015-01-09 DIAGNOSIS — Z8249 Family history of ischemic heart disease and other diseases of the circulatory system: Secondary | ICD-10-CM | POA: Diagnosis not present

## 2015-01-09 DIAGNOSIS — Z885 Allergy status to narcotic agent status: Secondary | ICD-10-CM | POA: Diagnosis not present

## 2015-01-09 DIAGNOSIS — G8929 Other chronic pain: Secondary | ICD-10-CM | POA: Diagnosis present

## 2015-01-09 DIAGNOSIS — K219 Gastro-esophageal reflux disease without esophagitis: Secondary | ICD-10-CM | POA: Diagnosis not present

## 2015-01-09 DIAGNOSIS — Z951 Presence of aortocoronary bypass graft: Secondary | ICD-10-CM | POA: Diagnosis not present

## 2015-01-09 DIAGNOSIS — B3789 Other sites of candidiasis: Secondary | ICD-10-CM | POA: Diagnosis present

## 2015-01-09 DIAGNOSIS — Z681 Body mass index (BMI) 19 or less, adult: Secondary | ICD-10-CM | POA: Diagnosis not present

## 2015-01-09 DIAGNOSIS — F17210 Nicotine dependence, cigarettes, uncomplicated: Secondary | ICD-10-CM | POA: Diagnosis present

## 2015-01-09 DIAGNOSIS — Z955 Presence of coronary angioplasty implant and graft: Secondary | ICD-10-CM | POA: Diagnosis not present

## 2015-01-09 DIAGNOSIS — E785 Hyperlipidemia, unspecified: Secondary | ICD-10-CM | POA: Diagnosis present

## 2015-01-09 DIAGNOSIS — Z90710 Acquired absence of both cervix and uterus: Secondary | ICD-10-CM | POA: Diagnosis not present

## 2015-01-09 DIAGNOSIS — I252 Old myocardial infarction: Secondary | ICD-10-CM | POA: Diagnosis not present

## 2015-01-09 DIAGNOSIS — Z79899 Other long term (current) drug therapy: Secondary | ICD-10-CM | POA: Diagnosis not present

## 2015-01-09 DIAGNOSIS — F329 Major depressive disorder, single episode, unspecified: Secondary | ICD-10-CM | POA: Diagnosis present

## 2015-01-09 DIAGNOSIS — K210 Gastro-esophageal reflux disease with esophagitis: Secondary | ICD-10-CM | POA: Diagnosis present

## 2015-01-09 LAB — URINE CULTURE: Culture: 9000

## 2015-01-09 MED ORDER — LEVOFLOXACIN IN D5W 750 MG/150ML IV SOLN
750.0000 mg | INTRAVENOUS | Status: DC
  Filled 2015-01-09: qty 150

## 2015-01-09 MED ORDER — ENSURE ENLIVE PO LIQD
237.0000 mL | Freq: Three times a day (TID) | ORAL | Status: DC
  Administered 2015-01-09 – 2015-01-10 (×3): 237 mL via ORAL

## 2015-01-09 MED ORDER — OXYCODONE-ACETAMINOPHEN 5-325 MG PO TABS
1.0000 | ORAL_TABLET | Freq: Four times a day (QID) | ORAL | Status: DC | PRN
  Administered 2015-01-09 – 2015-01-10 (×4): 2 via ORAL
  Filled 2015-01-09 (×4): qty 2

## 2015-01-09 MED ORDER — SENNOSIDES-DOCUSATE SODIUM 8.6-50 MG PO TABS
1.0000 | ORAL_TABLET | Freq: Two times a day (BID) | ORAL | Status: DC
  Administered 2015-01-09 – 2015-01-10 (×3): 1 via ORAL
  Filled 2015-01-09 (×5): qty 1

## 2015-01-09 MED ORDER — FLUCONAZOLE 100 MG PO TABS
100.0000 mg | ORAL_TABLET | Freq: Every day | ORAL | Status: DC
  Administered 2015-01-09 – 2015-01-10 (×2): 100 mg via ORAL
  Filled 2015-01-09 (×2): qty 1

## 2015-01-09 NOTE — Progress Notes (Signed)
  Echocardiogram 2D Echocardiogram has been performed.  [name] 01/09/2015, 10:16 AM

## 2015-01-09 NOTE — Progress Notes (Signed)
Initial Nutrition Assessment  DOCUMENTATION CODES:   Underweight, Severe malnutrition in context of chronic illness  INTERVENTION:    Ensure Enlive po TID, each supplement provides 350 kcal and 20 grams of protein  NUTRITION DIAGNOSIS:   Increased nutrient needs related to chronic illness as evidenced by estimated needs  GOAL:   Patient will meet greater than or equal to 90% of their needs  MONITOR:   PO intake, Supplement acceptance, Labs, Weight trends, I & O's  REASON FOR ASSESSMENT:   Malnutrition Screening Tool  ASSESSMENT:   64 y.o. female with a past medical history of stage III a non-small cell lung cancer diagnosed in September 2013 status post concurrent chemoradiation therapy, street of coronary artery disease ongoing tobacco abuse who presents to the emergency department with complaints of chest pain.  Pt reports an ongoing poor appetite.  Typically consumes 1 meal per day.  Drinks Ensure and/or Boost supplements at home.  Reveals she has not been able to gain weight since her back surgery and chemotherapy (not currently receiving).  Pt with unopened Ensure Enlive supplement on tray table.  Orders in place.  Nutrition-Focused physical exam completed. Findings are severe fat depletion, severe muscle depletion, and no edema.   Diet Order:  Diet Heart Room service appropriate?: Yes; Fluid consistency:: Thin  Skin:  Reviewed, no issues  Last BM:  Unknown  Height:   Ht Readings from Last 1 Encounters:  11/22/14 '5\' 6"'$  (1.676 m)    Weight:   Wt Readings from Last 1 Encounters:  11/22/14 94 lb (42.638 kg)    Ideal Body Weight:  59 kg  BMI:  15.1 kg/m2  Estimated Nutritional Needs:   Kcal:  1500-1700  Protein:  70-80 gm  Fluid:  1.5-1.7 L  EDUCATION NEEDS:   No education needs identified at this time  Arthur Holms, RD, LDN Pager #: [phone] After-Hours Pager #: [phone]

## 2015-01-09 NOTE — Progress Notes (Signed)
Pt was not in room   01/09/15 1100  Clinical Encounter Type  Visited With Patient  Visit Type Social support  Spiritual Encounters  Spiritual Needs Emotional

## 2015-01-09 NOTE — Progress Notes (Signed)
[facility] PROGRESS NOTE  [name] SEG:[id number] DOB: 03-Feb-1951 DOA: 01/08/2015 PCP: Tivis Ringer, MD  Assessment/Plan: 64 y/o female with PMH of CAD h/o CABG, COPD on home oxygen, Chronic pain, stage III a non-small cell lung cancer diagnosed in September 2013 status post concurrent chemoradiation therapy, ongoing tobacco abuse, h/o candidate esophagitis presented with burning chest pain, mild dyspnea   1. Chest pains. Atypical presentation. ACS ruled out. Patient describes burning, pain, worse with swallowing. She has mild oral thrush with h/o esophogeal candidiasis.  -no new episodes of chest pains. Pend echo. Trop/ECG no acute changes. We will empirically treat esophagitis. D/w patient, recommended to f/u with EGD in 2 weeks.   2. Suspected COPD exacerbation. Patient presenting with complaints of increasing cough associate with shortness of breath and chest pain.  -clinically is improving on systemic steroids, Solu-Medrol 40 mg IV every 8 hours along with empiric antibiotic therapy with Levaquin. scheduled duo nebs every 4 hours. 3. Oropharyngeal candidiasis. On exam patient having thrush, will treat with Diflucan 100 mg . Cont PPI 4. History of lung cancer. Patient having a history of stage IIIa non-small cell lung cancer status post concurrent chemoradiation therapy. She currently follows Dr. Julien Nordmann at the cancer center. Her last appointment was on 11/17/2004 time. Most recent CT scan of lungs performed on 11/14/2014 showed stable 8 mm spiculated right upper lobe nodule, without evidence of disease progression.  -recommended to f/u with oncology as outpatient  5. Established history of coronary artery disease. Will continue ASA, eEffient, and statin.   Code Status: DNR Family Communication: d/w patient, her daughter  (indicate person spoken with, relationship, and if by phone, the number) Disposition Plan: home 24-48 hrs     Consultants:  none  Procedures:  echo  Antibiotics:  Levofloxacin  (indicate start date, and stop date if known)  HPI/Subjective: Alert. No distress   Objective: Filed Vitals:   01/09/15 1039  BP: 146/79  Pulse: 64  Temp: 98.3 F (36.8 C)  Resp: 22    Intake/Output Summary (Last 24 hours) at 01/09/15 1139 Last data filed at 01/08/15 1700  Gross per 24 hour  Intake    240 ml  Output      0 ml  Net    240 ml   There were no vitals filed for this visit.  Exam:   General:  alert  Cardiovascular: s1,s2 rrr  Respiratory: diminished  in LL  Abdomen: soft, nt,nd   Musculoskeletal: no leg edema   Data Reviewed: Basic Metabolic Panel:  Recent Labs Lab 01/08/15 0918  NA 136  K 3.5  CL 97*  CO2 29  GLUCOSE 117*  BUN 6  CREATININE 0.48  CALCIUM 9.0   Liver Function Tests: No results for input(s): AST, ALT, ALKPHOS, BILITOT, PROT, ALBUMIN in the last 168 hours. No results for input(s): LIPASE, AMYLASE in the last 168 hours. No results for input(s): AMMONIA in the last 168 hours. CBC:  Recent Labs Lab 01/08/15 0918  WBC 7.5  NEUTROABS 5.9  HGB 12.6  HCT 37.2  MCV 93.9  PLT 181   Cardiac Enzymes:  Recent Labs Lab 01/08/15 1442 01/08/15 1705 01/08/15 2017  TROPONINI 0.03 <0.03 <0.03   BNP (last 3 results)  Recent Labs  06/23/14 1523 09/27/14 1323 01/08/15 0918  BNP 58.5 199.1* 261.2*    ProBNP (last 3 results)  Recent Labs  03/03/14 1147  PROBNP 475.8*    CBG: No results for input(s): GLUCAP in the last 168 hours.  Recent Results (from the past 240 hour(s))  Urine culture     Status: None   Collection Time: 01/08/15 10:03 AM  Result Value Ref Range Status   Specimen Description URINE, CLEAN CATCH  Final   Special Requests NONE  Final   Culture 9,000 COLONIES/mL INSIGNIFICANT GROWTH  Final   Report Status 01/09/2015 FINAL  Final     Studies: Dg Chest 2 View  01/09/2015   CLINICAL DATA:  Shortness of breath,  COPD coronary artery disease.  EXAM: CHEST  2 VIEW  COMPARISON:  PA and lateral chest x-ray of January 08, 2015  FINDINGS: The lungs remain hyperinflated and clear. The heart is normal in size. The pulmonary vascularity is not engorged. There is no significant pleural effusion and there is no pneumothorax. There is 7 intact sternal wires. There are posterior spinal fusion device is present in the lower thoracic and lumbar spine.  IMPRESSION: COPD. There is no CHF, pneumonia, nor other acute cardiopulmonary abnormality.   Electronically Signed   By: David  Martinique M.D.   On: 01/09/2015 07:31   Dg Chest 2 View  01/08/2015   CLINICAL DATA:  Shortness of breath.  Weakness today.  EXAM: CHEST  2 VIEW  COMPARISON:  09/27/2014.  Chest CT 11/14/2014  FINDINGS: There is hyperinflation of the lungs compatible with COPD. Heart and mediastinal contours are within normal limits. No focal opacities or effusions. No acute bony abnormality.  IMPRESSION: COPD.  No active disease.   Electronically Signed   By: Rolm Baptise M.D.   On: 01/08/2015 10:35    Scheduled Meds: . aspirin EC  81 mg Oral Daily  . atorvastatin  80 mg Oral QHS  . budesonide-formoterol  2 puff Inhalation BID  . enoxaparin (LOVENOX) injection  40 mg Subcutaneous Q24H  . ezetimibe  10 mg Oral Daily  . feeding supplement (ENSURE ENLIVE)  237 mL Oral BID BM  . fluconazole  100 mg Oral Daily  . ipratropium-albuterol  3 mL Nebulization Q6H  . [START ON 01/10/2015] levofloxacin (LEVAQUIN) IV  750 mg Intravenous Q48H  . methylPREDNISolone (SOLU-MEDROL) injection  40 mg Intravenous 3 times per day  . pantoprazole  40 mg Oral BID  . 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 EF ~45%   CAD (coronary artery disease), autologous vein bypass graft, cath 01/12/13 with Xience DES to  VG-RCA   Chronic respiratory failure with hypoxia   COPD exacerbation   Acute and chronic respiratory failure with hypoxia   Oropharyngeal candidiasis    Time spent: >35 minutes     Kinnie Feil  [facility] Pager [phone]. If 7PM-7AM, please contact night-coverage at [email or web address], password [facility] 01/09/2015, 11:39 AM

## 2015-01-10 DIAGNOSIS — I255 Ischemic cardiomyopathy: Secondary | ICD-10-CM

## 2015-01-10 DIAGNOSIS — K219 Gastro-esophageal reflux disease without esophagitis: Secondary | ICD-10-CM

## 2015-01-10 MED ORDER — IPRATROPIUM-ALBUTEROL 0.5-2.5 (3) MG/3ML IN SOLN: 3.0000 mL | Freq: Two times a day (BID) | RESPIRATORY_TRACT | Status: DC

## 2015-01-10 MED ORDER — ALBUTEROL SULFATE (2.5 MG/3ML) 0.083% IN NEBU
2.5000 mg | INHALATION_SOLUTION | Freq: Four times a day (QID) | RESPIRATORY_TRACT | Status: AC | PRN
Start: 2015-01-10 — End: ?

## 2015-01-10 MED ORDER — ALBUTEROL SULFATE HFA 108 (90 BASE) MCG/ACT IN AERS: 1.0000 | INHALATION_SPRAY | Freq: Four times a day (QID) | RESPIRATORY_TRACT | Status: DC | PRN

## 2015-01-10 MED ORDER — PANTOPRAZOLE SODIUM 40 MG PO TBEC: 40.0000 mg | DELAYED_RELEASE_TABLET | Freq: Two times a day (BID) | ORAL | Status: DC

## 2015-01-10 MED ORDER — PREDNISONE 20 MG PO TABS: 20.0000 mg | ORAL_TABLET | Freq: Every day | ORAL | Status: DC

## 2015-01-10 MED ORDER — METOPROLOL TARTRATE 1 MG/ML IV SOLN
5.0000 mg | Freq: Once | INTRAVENOUS | Status: AC
  Administered 2015-01-10: 5 mg via INTRAVENOUS
  Filled 2015-01-10: qty 5

## 2015-01-10 MED ORDER — FLUCONAZOLE 100 MG PO TABS: 100.0000 mg | ORAL_TABLET | Freq: Every day | ORAL | Status: AC

## 2015-01-10 MED ORDER — LEVOFLOXACIN 500 MG PO TABS: 500.0000 mg | ORAL_TABLET | Freq: Every day | ORAL | Status: DC

## 2015-01-10 NOTE — Progress Notes (Signed)
[facility] PROGRESS NOTE  [name] AQT:[id number] DOB: Dec 04, 1950 DOA: 01/08/2015 PCP: Tivis Ringer, MD  Assessment/Plan: 64 y/o female with PMH of CAD h/o CABG, COPD on home oxygen, Chronic pain, stage III a non-small cell lung cancer diagnosed in September 2013 status post concurrent chemoradiation therapy, ongoing tobacco abuse, h/o candidate esophagitis presented with burning chest pain, mild dyspnea   1. Chest pains. Atypical presentation. ACS ruled out. But new LV dysfunction. Patient describes burning, pain, worse with swallowing. She has mild oral thrush with h/o esophogeal candidiasis.  -h/o CAD with CABG. On ASA, eEffient, and statin. New LV dysfunction: LVEF 40-45% with Regional wall motion abnormalities. Trop/ECG no acute changes. I have consulted cardiology evaluation  2. Suspected COPD exacerbation. Patient presenting with complaints of increasing cough associate with shortness of breath and chest pain.  -clinically is improving on systemic steroids, Solu-Medrol 40 mg IV every 8 hours along with empiric antibiotic therapy with Levaquin. scheduled duo nebs every 4 hours. 3. Oropharyngeal candidiasis. On exam patient having thrush, will treat with Diflucan 100 mg . Cont PPI. We will empirically treat esophagitis. D/w patient, recommended to f/u with EGD in 2 weeks.  4. History of lung cancer. Patient having a history of stage IIIa non-small cell lung cancer status post concurrent chemoradiation therapy. She currently follows Dr. Julien Nordmann at the cancer center. Her last appointment was on 11/17/2004 time. Most recent CT scan of lungs performed on 11/14/2014 showed stable 8 mm spiculated right upper lobe nodule, without evidence of disease progression.  -recommended to f/u with oncology as outpatient   Patient is requesting to go home today pend cardiology eval.     Code Status: DNR Family Communication: d/w patient, her daughter  (indicate person spoken with,  relationship, and if by phone, the number) Disposition Plan: home 24-48 hrs    Consultants:  none  Procedures:  echo  Antibiotics:  Levofloxacin  (indicate start date, and stop date if known)  HPI/Subjective: Alert. No distress   Objective: Filed Vitals:   01/10/15 0438  BP: 146/82  Pulse: 82  Temp: 98.4 F (36.9 C)  Resp:     Intake/Output Summary (Last 24 hours) at 01/10/15 5625 Last data filed at 01/09/15 2047  Gross per 24 hour  Intake    600 ml  Output      0 ml  Net    600 ml   There were no vitals filed for this visit.  Exam:   General:  alert  Cardiovascular: s1,s2 rrr  Respiratory: diminished  in LL  Abdomen: soft, nt,nd   Musculoskeletal: no leg edema   Data Reviewed: Basic Metabolic Panel:  Recent Labs Lab 01/08/15 0918  NA 136  K 3.5  CL 97*  CO2 29  GLUCOSE 117*  BUN 6  CREATININE 0.48  CALCIUM 9.0   Liver Function Tests: No results for input(s): AST, ALT, ALKPHOS, BILITOT, PROT, ALBUMIN in the last 168 hours. No results for input(s): LIPASE, AMYLASE in the last 168 hours. No results for input(s): AMMONIA in the last 168 hours. CBC:  Recent Labs Lab 01/08/15 0918  WBC 7.5  NEUTROABS 5.9  HGB 12.6  HCT 37.2  MCV 93.9  PLT 181   Cardiac Enzymes:  Recent Labs Lab 01/08/15 1442 01/08/15 1705 01/08/15 2017  TROPONINI 0.03 <0.03 <0.03   BNP (last 3 results)  Recent Labs  06/23/14 1523 09/27/14 1323 01/08/15 0918  BNP 58.5 199.1* 261.2*    ProBNP (last 3 results)  Recent Labs  03/03/14 1147  PROBNP 475.8*    CBG: No results for input(s): GLUCAP in the last 168 hours.  Recent Results (from the past 240 hour(s))  Urine culture     Status: None   Collection Time: 01/08/15 10:03 AM  Result Value Ref Range Status   Specimen Description URINE, CLEAN CATCH  Final   Special Requests NONE  Final   Culture 9,000 COLONIES/mL INSIGNIFICANT GROWTH  Final   Report Status 01/09/2015 FINAL  Final      Studies: Dg Chest 2 View  01/09/2015   CLINICAL DATA:  Shortness of breath, COPD coronary artery disease.  EXAM: CHEST  2 VIEW  COMPARISON:  PA and lateral chest x-ray of January 08, 2015  FINDINGS: The lungs remain hyperinflated and clear. The heart is normal in size. The pulmonary vascularity is not engorged. There is no significant pleural effusion and there is no pneumothorax. There is 7 intact sternal wires. There are posterior spinal fusion device is present in the lower thoracic and lumbar spine.  IMPRESSION: COPD. There is no CHF, pneumonia, nor other acute cardiopulmonary abnormality.   Electronically Signed   By: David  Martinique M.D.   On: 01/09/2015 07:31   Dg Chest 2 View  01/08/2015   CLINICAL DATA:  Shortness of breath.  Weakness today.  EXAM: CHEST  2 VIEW  COMPARISON:  09/27/2014.  Chest CT 11/14/2014  FINDINGS: There is hyperinflation of the lungs compatible with COPD. Heart and mediastinal contours are within normal limits. No focal opacities or effusions. No acute bony abnormality.  IMPRESSION: COPD.  No active disease.   Electronically Signed   By: Rolm Baptise M.D.   On: 01/08/2015 10:35    Scheduled Meds: . aspirin EC  81 mg Oral Daily  . atorvastatin  80 mg Oral QHS  . budesonide-formoterol  2 puff Inhalation BID  . enoxaparin (LOVENOX) injection  40 mg Subcutaneous Q24H  . ezetimibe  10 mg Oral Daily  . feeding supplement (ENSURE ENLIVE)  237 mL Oral TID BM  . fluconazole  100 mg Oral Daily  . ipratropium-albuterol  3 mL Nebulization Q6H  . levofloxacin (LEVAQUIN) IV  750 mg Intravenous Q48H  . methylPREDNISolone (SOLU-MEDROL) injection  40 mg Intravenous 3 times per day  . pantoprazole  40 mg Oral BID  . senna-docusate  1 tablet Oral BID  . 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 EF  ~45%   CAD (coronary artery disease), autologous vein bypass graft, cath 01/12/13 with Xience DES to VG-RCA   Chronic respiratory failure with hypoxia   COPD exacerbation   Acute and chronic respiratory failure with hypoxia   Oropharyngeal candidiasis    Time spent: >35 minutes     Kinnie Feil  [facility] Pager [phone]. If 7PM-7AM, please contact night-coverage at [email or web address], password [facility] 01/10/2015, 8:21 AM  LOS: 1 day

## 2015-01-10 NOTE — Discharge Summary (Signed)
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 EF ~45%   CAD (coronary artery disease), autologous vein bypass graft, cath 01/12/13 with Xience DES to VG-RCA   Chronic respiratory failure with hypoxia   COPD exacerbation   Acute and chronic respiratory failure with hypoxia   Oropharyngeal candidiasis   Discharge Condition: stable   Diet recommendation: low sodium   There were no vitals filed for this visit.  History of present illness:  64 y/o female with PMH of CAD h/o CABG, COPD on home oxygen, Chronic pain, stage III a non-small cell lung cancer diagnosed in September 2013 status post concurrent chemoradiation therapy, ongoing tobacco abuse, h/o candidate esophagitis presented with burning chest pain, mild dyspnea   Hospital Course:  1. Chest pains. Atypical presentation. ACS ruled out. Patient describes burning, pain, worse with swallowing. She has mild oral thrush with h/o esophogeal candidiasis.  -h/o CAD with CABG. On ASA, statin. New LV dysfunction: LVEF 40-45% with Regional wall motion abnormalities. Trop/ECG no acute changes. Patient is euvolemic, no s/s of CHF.  D/w cardiology, who recommended to cont current medical regimen, f/u with cardiology as outpatient. Patient with significant co morbidities, thought not a good candidate for interventions. She is DNR  2. Suspected COPD exacerbation. Patient presenting with complaints of increasing cough associate with shortness of breath and chest pain.   -clinically is improved on systemic steroids, Solu-Medrol 40 +empiric antibiotic therapy with Levaquin. scheduled duo nebs every 4 hours. transitioned to oral regimen to complete the treatment regimen  3. Oropharyngeal candidiasis. On exam patient having thrush, started Diflucan empirically treat esophagitis. D/w patient, recommended to f/u with EGD in 2 weeks.  4. History of lung cancer. Patient having a history of stage IIIa non-small cell lung cancer status post concurrent chemoradiation therapy. She currently follows [name] at the cancer center. Her last appointment was on 11/17/2004 time. Most recent CT scan of lungs performed on 11/14/2014 showed stable 8 mm spiculated right upper lobe nodule, without evidence of disease progression.  -recommended to f/u with oncology as outpatient    Procedures:  Echo Study Conclusions  - Procedure narrative: Transthoracic echocardiography. Image quality was adequate. The study was technically difficult. - Left ventricle: The cavity size was normal. Systolic function was mildly reduced. The estimated ejection fraction was in the range of 45% to 50%. Dyskinesis of the anteroseptal myocardium. Hypokinesis of the anterior myocardium. Left ventricular diastolic function parameters were normal. - Aortic valve: There was trivial regurgitation. - Mitral valve: There was mild regurgitation. - Left atrium: The atrium was mildly dilated. - Right ventricle: Systolic function was mildly reduced. (i.e. Studies not automatically included, echos, thoracentesis, etc; not x-rays)  Consultations:  Cardiology   Discharge Exam: Filed Vitals:   01/10/15 0438  BP: 146/82  Pulse: 82  Temp: 98.4 F (36.9 C)  Resp:     General: alert. No distress  Cardiovascular: s1,s2 rrr Respiratory: CTA BL  Discharge Instructions  Discharge Instructions    Diet - low sodium heart healthy  Complete by:  As directed      Discharge instructions     Complete by:  As directed   Please follow up with primary care doctor in 1-2 weeks as needed Please follow up with gastroenterologist in 2-3 weeks Please follow up with cardiology in 3-4 weeks     Increase activity slowly    Complete by:  As directed             Medication List    STOP taking these medications        fluticasone 50 MCG/ACT nasal spray  Commonly known as:  FLONASE     omeprazole 20 MG capsule  Commonly known as:  PRILOSEC      TAKE these medications        albuterol (2.5 MG/3ML) 0.083% nebulizer solution  Commonly known as:  PROVENTIL  Take 3 mLs (2.5 mg total) by nebulization every 6 (six) hours as needed for wheezing or shortness of breath.     albuterol 108 (90 BASE) MCG/ACT inhaler  Commonly known as:  PROAIR HFA  Inhale 1-2 puffs into the lungs every 6 (six) hours as needed for wheezing or shortness of breath.     aspirin EC 81 MG tablet  Take 81 mg by mouth daily.     atorvastatin 80 MG tablet  Commonly known as:  LIPITOR  Take 80 mg by mouth at bedtime.     BREO ELLIPTA 100-25 MCG/INH Aepb  Generic drug:  Fluticasone Furoate-Vilanterol  Inhale 1 puff into the lungs 2 (two) times daily.     diazepam 5 MG tablet  Commonly known as:  VALIUM  Take 5 mg by mouth 2 (two) times daily as needed for anxiety.     escitalopram 10 MG tablet  Commonly known as:  LEXAPRO  Take 1 tablet (10 mg total) by mouth daily.     famotidine 20 MG tablet  Commonly known as:  PEPCID  Take 1 tablet (20 mg total) by mouth at bedtime.     fluconazole 100 MG tablet  Commonly known as:  DIFLUCAN  Take 1 tablet (100 mg total) by mouth daily.     HYDROcodone-acetaminophen 10-325 MG per tablet  Commonly known as:  NORCO  Take 1-2 tablets by mouth every 6 (six) hours as needed for moderate pain.     levofloxacin 500 MG tablet  Commonly known as:  LEVAQUIN  Take 1 tablet (500 mg total) by mouth daily.     loratadine 10 MG tablet  Commonly known as:  CLARITIN   Take 1 tablet (10 mg total) by mouth daily as needed for allergies.     nitroGLYCERIN 0.4 MG SL tablet  Commonly known as:  NITROSTAT  Place 0.4 mg under the tongue every 5 (five) minutes as needed for chest pain.     nortriptyline 25 MG capsule  Commonly known as:  PAMELOR  Take 1 capsule (25 mg total) by mouth at bedtime.     ondansetron 4 MG tablet  Commonly known as:  ZOFRAN  Take 1 tablet (4 mg total) by mouth every 8 (eight) hours as needed for nausea or vomiting.     OXYGEN  Inhale 4 L into the lungs continuous.     pantoprazole 40 MG tablet  Commonly known as:  PROTONIX  Take 1 tablet (40 mg total) by mouth 2 (two) times daily.     predniSONE 20 MG tablet  Commonly known as:  DELTASONE  Take 1 tablet (20 mg  total) by mouth daily with breakfast.     SPIRIVA HANDIHALER 18 MCG inhalation capsule  Generic drug:  tiotropium  PLACE 1 CAPSULE (18 MCG TOTAL) INTO INHALER AND INHALE DAILY.     SYMBICORT 160-4.5 MCG/ACT inhaler  Generic drug:  budesonide-formoterol  INHALE 2 PUFFS INTO THE LUNGS TWICE A DAY THEN RINSE AFTER DOSING     ZETIA 10 MG tablet  Generic drug:  ezetimibe  TAKE 1 TABLET BY MOUTH EVERY DAY       Allergies  Allergen Reactions  . Amiodarone Other (See Comments)    Drops HR too low, SOB, fatigue  . Clopidogrel Bisulfate Hives  . Dilaudid [Hydromorphone Hcl] Other (See Comments)    "I couldn't function for 2 days."  . Multaq [Dronedarone] Other (See Comments)    Heart races        Follow-up Information    Follow up with Whitney Ringer, MD In 1 week.   Specialty:  Internal Medicine   Contact information:   [address] [place] [address] [phone]       Follow up with Quay Burow, MD In 3 weeks.   Specialties:  Cardiology, Radiology   Contact information:   [address] [place] [address] [phone]        The results of significant diagnostics from this hospitalization (including imaging,  microbiology, ancillary and laboratory) are listed below for reference.    Significant Diagnostic Studies: Dg Chest 2 View  01/09/2015   CLINICAL DATA:  Shortness of breath, COPD coronary artery disease.  EXAM: CHEST  2 VIEW  COMPARISON:  PA and lateral chest x-ray of January 08, 2015  FINDINGS: The lungs remain hyperinflated and clear. The heart is normal in size. The pulmonary vascularity is not engorged. There is no significant pleural effusion and there is no pneumothorax. There is 7 intact sternal wires. There are posterior spinal fusion device is present in the lower thoracic and lumbar spine.  IMPRESSION: COPD. There is no CHF, pneumonia, nor other acute cardiopulmonary abnormality.   Electronically Signed   By: David  Martinique M.D.   On: 01/09/2015 07:31   Dg Chest 2 View  01/08/2015   CLINICAL DATA:  Shortness of breath.  Weakness today.  EXAM: CHEST  2 VIEW  COMPARISON:  09/27/2014.  Chest CT 11/14/2014  FINDINGS: There is hyperinflation of the lungs compatible with COPD. Heart and mediastinal contours are within normal limits. No focal opacities or effusions. No acute bony abnormality.  IMPRESSION: COPD.  No active disease.   Electronically Signed   By: Rolm Baptise M.D.   On: 01/08/2015 10:35    Microbiology: Recent Results (from the past 240 hour(s))  Urine culture     Status: None   Collection Time: 01/08/15 10:03 AM  Result Value Ref Range Status   Specimen Description URINE, CLEAN CATCH  Final   Special Requests NONE  Final   Culture 9,000 COLONIES/mL INSIGNIFICANT GROWTH  Final   Report Status 01/09/2015 FINAL  Final     Labs: Basic Metabolic Panel:  Recent Labs Lab 01/08/15 0918  NA 136  K 3.5  CL 97*  CO2 29  GLUCOSE 117*  BUN 6  CREATININE 0.48  CALCIUM 9.0   Liver Function Tests: No results for input(s): AST, ALT, ALKPHOS, BILITOT, PROT, ALBUMIN in the last 168 hours. No results for input(s): LIPASE, AMYLASE in the last 168 hours. No results for input(s):  AMMONIA in the last 168 hours. CBC:  Recent Labs  Lab 01/08/15 0918  WBC 7.5  NEUTROABS 5.9  HGB 12.6  HCT 37.2  MCV 93.9  PLT 181   Cardiac Enzymes:  Recent Labs Lab 01/08/15 1442 01/08/15 1705 01/08/15 2017  TROPONINI 0.03 <0.03 <0.03   BNP: BNP (last 3 results)  Recent Labs  06/23/14 1523 09/27/14 1323 01/08/15 0918  BNP 58.5 199.1* 261.2*    ProBNP (last 3 results)  Recent Labs  03/03/14 1147  PROBNP 475.8*    CBG: No results for input(s): GLUCAP in the last 168 hours.     SignedKinnie Feil  [facility] 01/10/2015, 11:16 AM

## 2015-01-10 NOTE — Progress Notes (Signed)
Pt has been up ambulating in hall independently on room air without difficulty. She is very anxious to go home. Discharge instructions given and pt patient discharged with grandson.

## 2015-01-10 NOTE — Consult Note (Signed)
CARDIOLOGY CONSULT NOTE   Patient ID: [name] MRN: [id number] DOB/AGE: [birth year] 64 y.o.  Admit Date: 01/08/2015 Referring Physician: [facility], Dr. Daleen Bo Primary Physician: Tivis Ringer, MD Consulting Cardiologist: Dr. Ron Parker Primary Cardiologist: Dr. Gwenlyn Found Reason for Consultation: Chest Pain  Clinical Summary: [name] is a 64 y.o.female with PMHx of ischemic cardiomyopathy EF 45%, CAD s/p CABG, Stage III NSCLC s/p chemoradiation, HLD, PVD, tobacco abuse and COPD on 4L [name] who presented to the ED on 01/08/15 with complaint of substernal chest pain with radiation to her neck and jaw that began while brushing her teeth. Pain was associated with shortness of breath above baseline, throat irritation and lightheadedness. Patient took ASA and SL nitrogen with some improvement in pain.   In the ED, vital signs were stable, satting well on home oxygen 4 L via [place], mildly hypertensive in the 140-150s/80s. CBC and BMET were within normal limits. Troponin negative x 3. CXR showed COPD, and no CHF, pneumonia or other acute abnormality. BNP mildly elevated at 261.2. Echo showed EF 45-50% and mild MVR. Previous echo in 06/2014 showed EF 55-60%, no MVR, and no regional wall motion abnormalities; however they could not be excluded; however ventriculogram during cath in 2011 showed EF of 40%.   During the course of her stay, patient admitted to increased cough and sputum production concerning for COPD exacerbation and patient was started on solumedrol, Levaquin and duoneb treatment. She was also found to have recurrent oropharyngeal candidiasis and complaint of worsened chest pain when she would swallow. Patient was started on diflucan and PPI. Cardiology was consulted for evaluation of new LV dysfunction.   Today, patient complains of mid-sternal chest pain when she swallow and diffuse pain when she coughs. She denies any nausea or diaphoresis.    Allergies  Allergen Reactions  .  Amiodarone Other (See Comments)    Drops HR too low, SOB, fatigue  . Clopidogrel Bisulfate Hives  . Dilaudid [Hydromorphone Hcl] Other (See Comments)    "I couldn't function for 2 days."  . Multaq [Dronedarone] Other (See Comments)    Heart races     Medications Scheduled Medications: . aspirin EC  81 mg Oral Daily  . atorvastatin  80 mg Oral QHS  . budesonide-formoterol  2 puff Inhalation BID  . enoxaparin (LOVENOX) injection  40 mg Subcutaneous Q24H  . ezetimibe  10 mg Oral Daily  . feeding supplement (ENSURE ENLIVE)  237 mL Oral TID BM  . fluconazole  100 mg Oral Daily  . ipratropium-albuterol  3 mL Nebulization BID  . levofloxacin (LEVAQUIN) IV  750 mg Intravenous Q48H  . methylPREDNISolone (SOLU-MEDROL) injection  40 mg Intravenous 3 times per day  . pantoprazole  40 mg Oral BID  . senna-docusate  1 tablet Oral BID  . sodium chloride  3 mL Intravenous Q12H    Infusions:    PRN Medications: acetaminophen **OR** acetaminophen, bisacodyl, gi cocktail, loratadine, morphine injection, ondansetron **OR** ondansetron (ZOFRAN) IV, oxyCODONE-acetaminophen   Past Medical History  Diagnosis Date  . Acute myocardial infarction, unspecified site, episode of care unspecified 2011    VF arrest. Ruled in, cath with no clear culprit lesion, inferior WMA on LV gram, medical therapy  . Esophageal dysmotility   . Hemorrhoids   . Esophageal stricture   . Diaphragmatic hernia without mention of obstruction or gangrene   . 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 CAD (coronary artery disease)     CABG'96., Xience DES -VG-RCA (  01/12/13)  . Peripheral neuropathy   . Hyperlipidemia   . Peptic ulcer, unspecified site, unspecified as acute or chronic, without mention of hemorrhage, perforation, or obstruction   . Depressive disorder, not elsewhere classified   . Anxiety state, unspecified   . COPD (chronic obstructive pulmonary disease)   . GERD (gastroesophageal reflux  disease)   . Candida esophagitis     on multiple occasions  . C. difficile diarrhea   . PVD (peripheral vascular disease)   . Headache(784.0)   . Weight loss 06/13/2011    scheduled here by family doctor  . CHF (congestive heart failure) 2011  . Asthma   . Hypertension   . History of nuclear stress test 07/14/2010    dipyriadmole; normal pattern of perfusion, low risk   . History of tobacco use     quit 05/2009  . Non-small cell carcinoma of lung, stage 3     Dr. Roxan Hockey  . History of radiation therapy 02/28/2012-04/14/2012    right lung  . Bilateral carotid artery disease   . TIA (transient ischemic attack)     amaurosis fugax  . Tubular adenoma of colon   . On home oxygen therapy     "4L; 24/7" (01/08/2015)    Past Surgical History  Procedure Laterality Date  . Tonsillectomy  1974  . Mandible fracture surgery Left 1976  . Breast surgery Bilateral 1969, 1970    "tumors"  . Epigastric hernia repair  2008    Dr Roxan Hockey  . Coronary artery bypass graft  02/01/1994    left internal mammary to LAD, vein graft to diagonal/post descending, post lateral, marginals, circumfle, diagonal  . Carpal tunnel release    . Laminectomy    . Subclavian vein angioplasty / stenting Left   . Transthoracic echocardiogram  08/14/2009    EF=>55%, normal LV systolic function, mild hypokinesis of basal segments of inferolateral & anterolater walls; mild MR/TR; AV mildly sclerotic  . Coronary angioplasty with stent placement  08/29/2000    stent to RCA (Dr. Corky Downs)  . Cardiac catheterization  01/17/2002    patent grafts (Dr. Marella Chimes)  . Cardiac catheterization  02/13/2004    significant L main & 3 vessel CAD, patents grafts, mild distal aortic disease (Dr. Gerrie Nordmann)  . Cardiac catheterization  07/14/2005    patent grafts, normal LV function (Dr. Adora Fridge)  . Cardiac catheterization  09/26/2008    patent grafts, normal LV function (Dr. Adora Fridge)  . Cardiac catheterization  05/31/2009     patent grafts, inferior wall motion abnormality (Dr. Adora Fridge)  . Cardiac catheterization  10/03/2009    normal LV function; 100% native RCA, circumflex, distal LAD occlusion  . Coronary angioplasty with stent placement  01/12/2013    Xience DES to SVG-RCA distal  . Carotid doppler  08/14/2009    R & L ICAs - 0-49% diameter reduction   . Left heart catheterization with coronary/graft angiogram N/A 01/12/2013    Procedure: LEFT HEART CATHETERIZATION WITH Beatrix Fetters;  Surgeon: Leonie Man, MD;  Location: [facility] CATH LAB;  Service: Cardiovascular;  Laterality: N/A;  . Vaginal hysterectomy  1998    Family History  Problem Relation Age of Onset  . Lung cancer Father     also CVA  . Heart disease Father   . Asthma Father   . Allergies Father   . Cancer Father   . Heart disease Mother     also CVA, MI  . Brain cancer Mother   .  Heart disease Sister   . Colon cancer Neg Hx   . Lung cancer Brother 66    also ENT cancer  . Hypertension Brother 81    also aneurysm, early CAD  . Breast cancer Other     family hx    Social History Ms. Hobin reports that she has been smoking Cigarettes.  She has a 45 pack-year smoking history. She has never used smokeless tobacco. Ms. Bridgett reports that she does not drink alcohol.  Review of Systems General: Admits to fatigue, decreased appetite. Denies fever, chills, and diaphoresis.  Respiratory: Admits to SOB, cough, DOE. Denies chest tightness.   Cardiovascular: Admits to chest pain. Denies palpitations.  Gastrointestinal: Denies nausea, vomiting, abdominal pain, diarrhea, constipation Musculoskeletal: Admits to chronic back pain. Neurological: Denies dizziness, headaches, weakness, lightheadedness  Physical Examination Filed Vitals:   01/10/15 0155 01/10/15 0300 01/10/15 0438 01/10/15 0838  BP: 139/82  146/82   Pulse: 118  82   Temp:  98.3 F (36.8 C) 98.4 F (36.9 C)   TempSrc:  Oral Oral   Resp:      SpO2: 100%  100% 100%    General: Vital signs reviewed.  Patient is cachetic, chronically ill appearing, in no acute distress and cooperative with exam.   HEENT: +thrush. No JVD, + left carotid bruit present.  Cardiovascular: Tachycardic, regular rhythm, S1 normal, S2 normal Pulmonary/Chest: Decreased breath sounds, mild rhonchi that clear with cough, mild crackles in right upper lung.  Abdominal: Soft, non-tender, scaphoid, BS +, no masses, organomegaly, or guarding present.  Extremities: No lower extremity edema bilaterally Neurological: A&O x3 Skin: Warm, dry and intact. No rashes or erythema. Psychiatric: Normal mood and affect. speech and behavior is normal. Cognition and memory are normal.   Prior Cardiac Testing/Procedures:  Echocardiogram 01/09/15: EF 45-50%, dyskinesis of anteroseptal myocardium, hypokinesis of anterior myocardium, mild mitral valve regurgitation. This echo was reviewed with Dr. Ron Parker. Current echo was compared with prior echo from 06/24/14 and there is some suggestion of decreased ejection fraction since February. There is also decreased motion of the septum.  Catheterization 2011: Patent grafts. Left main 80%, LAD 70%, 90% segmental. RCA occluded at mid portion, EF 40%.   Lab Results:  Basic Metabolic Panel:  Recent Labs Lab 01/08/15 0918  NA 136  K 3.5  CL 97*  CO2 29  GLUCOSE 117*  BUN 6  CREATININE 0.48  CALCIUM 9.0   CBC:  Recent Labs Lab 01/08/15 0918  WBC 7.5  NEUTROABS 5.9  HGB 12.6  HCT 37.2  MCV 93.9  PLT 181   Cardiac Enzymes:  Recent Labs Lab 01/08/15 1442 01/08/15 1705 01/08/15 2017  TROPONINI 0.03 <0.03 <0.03   Radiology: Dg Chest 2 View  01/09/2015   CLINICAL DATA:  Shortness of breath, COPD coronary artery disease.  EXAM: CHEST  2 VIEW  COMPARISON:  PA and lateral chest x-ray of January 08, 2015  FINDINGS: The lungs remain hyperinflated and clear. The heart is normal in size. The pulmonary vascularity is not engorged. There is no significant  pleural effusion and there is no pneumothorax. There is 7 intact sternal wires. There are posterior spinal fusion device is present in the lower thoracic and lumbar spine.  IMPRESSION: COPD. There is no CHF, pneumonia, nor other acute cardiopulmonary abnormality.   Electronically Signed   By: David  Martinique M.D.   On: 01/09/2015 07:31    ECG: Sinus Tachycardia, no evidence of ischemic changes. Similar to prior.   Impression and Recommendations:  Ischemic Cardiomyopathy with New LV Dysfunction: Echo during admission showed worsened EF of 45-50% with anterior hypokinesis and anterior septal dyskinesis of myocardium which was not seen on prior echocardiogram. Previous echo in February 2016 showed EF 55-60%. This echo was reviewed by Dr. Ron Parker who agrees there is suggestion of change in echo and LV function. However, her current symptoms do not appear to be ischemic. Therefore, there is no evidence patient is having ongoing ischemic chest pain. Given her severe co-moribidities, no further work up is recommended. Patient follows closely with Dr. Gwenlyn Found and during her last OV with him, a discussion was made to make the patient DNR. Effient was discontinued 11/22/14 due to excessive bruising. Patient has remained on ASA 81 mg daily. We recommend patient follow up with Dr. Gwenlyn Found upon discharge.    Chest Pain: ACS ruled out. Troponins negative x 3. No evidence of ischemia on new EKGs. Chest pain likely secondary to Candidal Esophagitis and COPD exacerbation.   HTN: Mildly hypertensive during admission in the 140-150s/80s. Patient was previously well controlled off of antihypertensives at home. HTN likely secondary to Duoneb treatments and steroids.  -Continue off antihypertensive medications  CAD s/p CABG: Patient had a CABG in 1995 with multiple interventions since then with most recent DES to to distal RCA vein graft in 2014. Effient discontinued 11/22/14 due to excessive bruising. Patient has remained on ASA 81 mg  daily.   Carotid Artery Stenosis: Left carotid artery shows 60-79% stenosis. Due to her multiple co-morbidities, patient was not a good candidate for revascularization.   Plan was discussed with Dr. Ron Parker, Cardiologist, and with Dr. Daleen Bo, Hospitalist.   Signed: Osa Craver, DO PGY-2 Internal Medicine Resident Pager # [phone] 01/10/2015 10:43 AM  Patient seen and examined. I agree with the assessment and plan as detailed above. See also my additional thoughts below.   As it is noted above, I personally reviewed the patient's echoes. There is evidence of some change in left ventricular dysfunction since February, 2016. Currently the patient is not having unstable angina symptoms. Considering all of her other comorbidities, no further cardiac workup would be recommended at this time.  Dola Argyle, MD, [facility] 01/10/2015 11:11 AM

## 2015-01-10 NOTE — Progress Notes (Signed)
MD paged regarding pt HR sustaining above 130s. Pt states feels like heart is racing. EKG done. New orders given. Will continue to monitor closely.  Raliegh Ip RN

## 2015-01-25 ENCOUNTER — Emergency Department (HOSPITAL_COMMUNITY)

## 2015-01-25 ENCOUNTER — Inpatient Hospital Stay (HOSPITAL_COMMUNITY)
Admission: EM | Admit: 2015-01-25 | Discharge: 2015-01-28 | DRG: 180 | Disposition: A | Discharge status: Hospice | Attending: Internal Medicine | Admitting: Internal Medicine

## 2015-01-25 ENCOUNTER — Encounter (HOSPITAL_COMMUNITY): Payer: Self-pay | Admitting: Emergency Medicine

## 2015-01-25 DIAGNOSIS — G8929 Other chronic pain: Secondary | ICD-10-CM | POA: Diagnosis present

## 2015-01-25 DIAGNOSIS — G629 Polyneuropathy, unspecified: Secondary | ICD-10-CM | POA: Diagnosis present

## 2015-01-25 DIAGNOSIS — Z9981 Dependence on supplemental oxygen: Secondary | ICD-10-CM

## 2015-01-25 DIAGNOSIS — I4719 Other supraventricular tachycardia: Secondary | ICD-10-CM | POA: Diagnosis present

## 2015-01-25 DIAGNOSIS — Z79899 Other long term (current) drug therapy: Secondary | ICD-10-CM | POA: Diagnosis not present

## 2015-01-25 DIAGNOSIS — E43 Unspecified severe protein-calorie malnutrition: Secondary | ICD-10-CM | POA: Diagnosis present

## 2015-01-25 DIAGNOSIS — E871 Hypo-osmolality and hyponatremia: Secondary | ICD-10-CM | POA: Diagnosis present

## 2015-01-25 DIAGNOSIS — I471 Supraventricular tachycardia: Secondary | ICD-10-CM | POA: Diagnosis present

## 2015-01-25 DIAGNOSIS — I739 Peripheral vascular disease, unspecified: Secondary | ICD-10-CM | POA: Diagnosis present

## 2015-01-25 DIAGNOSIS — Z515 Encounter for palliative care: Secondary | ICD-10-CM | POA: Diagnosis not present

## 2015-01-25 DIAGNOSIS — I2510 Atherosclerotic heart disease of native coronary artery without angina pectoris: Secondary | ICD-10-CM | POA: Diagnosis present

## 2015-01-25 DIAGNOSIS — Z681 Body mass index (BMI) 19 or less, adult: Secondary | ICD-10-CM | POA: Diagnosis not present

## 2015-01-25 DIAGNOSIS — K224 Dyskinesia of esophagus: Secondary | ICD-10-CM | POA: Diagnosis present

## 2015-01-25 DIAGNOSIS — J441 Chronic obstructive pulmonary disease with (acute) exacerbation: Secondary | ICD-10-CM | POA: Diagnosis present

## 2015-01-25 DIAGNOSIS — I509 Heart failure, unspecified: Secondary | ICD-10-CM | POA: Diagnosis present

## 2015-01-25 DIAGNOSIS — E876 Hypokalemia: Secondary | ICD-10-CM | POA: Diagnosis present

## 2015-01-25 DIAGNOSIS — F32A Depression, unspecified: Secondary | ICD-10-CM | POA: Diagnosis present

## 2015-01-25 DIAGNOSIS — J9621 Acute and chronic respiratory failure with hypoxia: Secondary | ICD-10-CM | POA: Diagnosis present

## 2015-01-25 DIAGNOSIS — K222 Esophageal obstruction: Secondary | ICD-10-CM | POA: Diagnosis present

## 2015-01-25 DIAGNOSIS — I4891 Unspecified atrial fibrillation: Secondary | ICD-10-CM | POA: Diagnosis present

## 2015-01-25 DIAGNOSIS — R000 Tachycardia, unspecified: Secondary | ICD-10-CM | POA: Diagnosis present

## 2015-01-25 DIAGNOSIS — IMO0002 Reserved for concepts with insufficient information to code with codable children: Secondary | ICD-10-CM | POA: Diagnosis present

## 2015-01-25 DIAGNOSIS — C3490 Malignant neoplasm of unspecified part of unspecified bronchus or lung: Secondary | ICD-10-CM | POA: Diagnosis not present

## 2015-01-25 DIAGNOSIS — Z66 Do not resuscitate: Secondary | ICD-10-CM | POA: Diagnosis present

## 2015-01-25 DIAGNOSIS — I4892 Unspecified atrial flutter: Secondary | ICD-10-CM | POA: Diagnosis present

## 2015-01-25 DIAGNOSIS — Z951 Presence of aortocoronary bypass graft: Secondary | ICD-10-CM | POA: Diagnosis not present

## 2015-01-25 DIAGNOSIS — Z7982 Long term (current) use of aspirin: Secondary | ICD-10-CM

## 2015-01-25 DIAGNOSIS — F411 Generalized anxiety disorder: Secondary | ICD-10-CM | POA: Diagnosis present

## 2015-01-25 DIAGNOSIS — Z8673 Personal history of transient ischemic attack (TIA), and cerebral infarction without residual deficits: Secondary | ICD-10-CM

## 2015-01-25 DIAGNOSIS — F329 Major depressive disorder, single episode, unspecified: Secondary | ICD-10-CM | POA: Diagnosis present

## 2015-01-25 DIAGNOSIS — I10 Essential (primary) hypertension: Secondary | ICD-10-CM | POA: Diagnosis present

## 2015-01-25 DIAGNOSIS — Z7951 Long term (current) use of inhaled steroids: Secondary | ICD-10-CM | POA: Diagnosis not present

## 2015-01-25 DIAGNOSIS — I252 Old myocardial infarction: Secondary | ICD-10-CM | POA: Diagnosis not present

## 2015-01-25 DIAGNOSIS — J45909 Unspecified asthma, uncomplicated: Secondary | ICD-10-CM | POA: Diagnosis present

## 2015-01-25 DIAGNOSIS — C3491 Malignant neoplasm of unspecified part of right bronchus or lung: Secondary | ICD-10-CM | POA: Diagnosis not present

## 2015-01-25 DIAGNOSIS — M549 Dorsalgia, unspecified: Secondary | ICD-10-CM | POA: Diagnosis present

## 2015-01-25 DIAGNOSIS — E785 Hyperlipidemia, unspecified: Secondary | ICD-10-CM | POA: Diagnosis present

## 2015-01-25 DIAGNOSIS — Z8711 Personal history of peptic ulcer disease: Secondary | ICD-10-CM | POA: Diagnosis not present

## 2015-01-25 DIAGNOSIS — Z885 Allergy status to narcotic agent status: Secondary | ICD-10-CM | POA: Diagnosis not present

## 2015-01-25 DIAGNOSIS — Z888 Allergy status to other drugs, medicaments and biological substances status: Secondary | ICD-10-CM | POA: Diagnosis not present

## 2015-01-25 DIAGNOSIS — I255 Ischemic cardiomyopathy: Secondary | ICD-10-CM | POA: Diagnosis present

## 2015-01-25 DIAGNOSIS — I25810 Atherosclerosis of coronary artery bypass graft(s) without angina pectoris: Secondary | ICD-10-CM | POA: Diagnosis present

## 2015-01-25 DIAGNOSIS — K219 Gastro-esophageal reflux disease without esophagitis: Secondary | ICD-10-CM | POA: Diagnosis present

## 2015-01-25 DIAGNOSIS — Z955 Presence of coronary angioplasty implant and graft: Secondary | ICD-10-CM

## 2015-01-25 DIAGNOSIS — J449 Chronic obstructive pulmonary disease, unspecified: Secondary | ICD-10-CM | POA: Diagnosis present

## 2015-01-25 DIAGNOSIS — B370 Candidal stomatitis: Secondary | ICD-10-CM | POA: Diagnosis present

## 2015-01-25 HISTORY — DX: Unspecified atrial fibrillation: I48.91

## 2015-01-25 LAB — BASIC METABOLIC PANEL
ANION GAP: 7 (ref 5–15)
BUN: 7 mg/dL (ref 6–20)
CHLORIDE: 92 mmol/L — AB (ref 101–111)
CO2: 35 mmol/L — AB (ref 22–32)
Calcium: 8.7 mg/dL — ABNORMAL LOW (ref 8.9–10.3)
Creatinine, Ser: 0.53 mg/dL (ref 0.44–1.00)
GFR calc Af Amer: >60 mL/min (ref >60)
GFR calc non Af Amer: >60 mL/min (ref >60)
GLUCOSE: 102 mg/dL — AB (ref 65–99)
POTASSIUM: 3.3 mmol/L — AB (ref 3.5–5.1)
Sodium: 134 mmol/L — ABNORMAL LOW (ref 135–145)

## 2015-01-25 LAB — CBC WITH DIFFERENTIAL/PLATELET
BASOS ABS: 0 10*3/uL (ref 0.0–0.1)
Basophils Relative: 0 % (ref 0–1)
EOS PCT: 1 % (ref 0–5)
Eosinophils Absolute: 0.1 10*3/uL (ref 0.0–0.7)
HEMATOCRIT: 39.6 % (ref 36.0–46.0)
Hemoglobin: 13.2 g/dL (ref 12.0–15.0)
LYMPHS ABS: 0.6 10*3/uL — AB (ref 0.7–4.0)
LYMPHS PCT: 9 % — AB (ref 12–46)
MCH: 31.4 pg (ref 26.0–34.0)
MCHC: 33.3 g/dL (ref 30.0–36.0)
MCV: 94.1 fL (ref 78.0–100.0)
Monocytes Absolute: 0.6 10*3/uL (ref 0.1–1.0)
Monocytes Relative: 9 % (ref 3–12)
NEUTROS ABS: 5.8 10*3/uL (ref 1.7–7.7)
Neutrophils Relative %: 81 % — ABNORMAL HIGH (ref 43–77)
PLATELETS: 165 10*3/uL (ref 150–400)
RBC: 4.21 MIL/uL (ref 3.87–5.11)
RDW: 12.9 % (ref 11.5–15.5)
WBC: 7.1 10*3/uL (ref 4.0–10.5)

## 2015-01-25 LAB — MAGNESIUM: Magnesium: 1.7 mg/dL (ref 1.7–2.4)

## 2015-01-25 LAB — TSH: TSH: 0.547 u[IU]/mL (ref 0.350–4.500)

## 2015-01-25 LAB — TROPONIN I: TROPONIN I: 0.03 ng/mL (ref <0.031)

## 2015-01-25 LAB — PROTIME-INR
INR: 1.12 (ref 0.00–1.49)
PROTHROMBIN TIME: 14.6 s (ref 11.6–15.2)

## 2015-01-25 LAB — I-STAT CG4 LACTIC ACID, ED: Lactic Acid, Venous: 0.83 mmol/L (ref 0.5–2.0)

## 2015-01-25 LAB — PHOSPHORUS: Phosphorus: 3.7 mg/dL (ref 2.5–4.6)

## 2015-01-25 LAB — BRAIN NATRIURETIC PEPTIDE: B Natriuretic Peptide: 634.8 pg/mL — ABNORMAL HIGH (ref 0.0–100.0)

## 2015-01-25 MED ORDER — HYDROMORPHONE HCL 1 MG/ML IJ SOLN
0.2500 mg | INTRAMUSCULAR | Status: DC | PRN
  Administered 2015-01-26 (×2): 0.5 mg via INTRAVENOUS
  Filled 2015-01-25 (×2): qty 1

## 2015-01-25 MED ORDER — POTASSIUM CHLORIDE CRYS ER 20 MEQ PO TBCR
40.0000 meq | EXTENDED_RELEASE_TABLET | Freq: Once | ORAL | Status: AC
  Administered 2015-01-25: 40 meq via ORAL
  Filled 2015-01-25: qty 2

## 2015-01-25 MED ORDER — OXYCODONE HCL 5 MG PO TABS
5.0000 mg | ORAL_TABLET | ORAL | Status: DC | PRN
  Administered 2015-01-25: 5 mg via ORAL
  Administered 2015-01-26 – 2015-01-28 (×5): 10 mg via ORAL
  Filled 2015-01-25 (×6): qty 2

## 2015-01-25 MED ORDER — DEXTROSE 5 % IV SOLN
5.0000 mg/h | Freq: Once | INTRAVENOUS | Status: DC
  Filled 2015-01-25: qty 100

## 2015-01-25 MED ORDER — LEVALBUTEROL HCL 0.63 MG/3ML IN NEBU
0.6300 mg | INHALATION_SOLUTION | Freq: Four times a day (QID) | RESPIRATORY_TRACT | Status: DC
Start: 2015-01-25 — End: 2015-01-28
  Administered 2015-01-25 – 2015-01-28 (×12): 0.63 mg via RESPIRATORY_TRACT
  Filled 2015-01-25 (×15): qty 3

## 2015-01-25 MED ORDER — IOHEXOL 350 MG/ML SOLN
75.0000 mL | Freq: Once | INTRAVENOUS | Status: AC | PRN
  Administered 2015-01-25: 75 mL via INTRAVENOUS

## 2015-01-25 MED ORDER — FOLIC ACID 1 MG PO TABS: 1.0000 mg | ORAL_TABLET | Freq: Every day | ORAL | Status: DC

## 2015-01-25 MED ORDER — TIOTROPIUM BROMIDE MONOHYDRATE 18 MCG IN CAPS
18.0000 ug | ORAL_CAPSULE | Freq: Every day | RESPIRATORY_TRACT | Status: DC
  Administered 2015-01-26 – 2015-01-28 (×3): 18 ug via RESPIRATORY_TRACT
  Filled 2015-01-25: qty 5

## 2015-01-25 MED ORDER — FOLIC ACID 5 MG/ML IJ SOLN
1.0000 mg | Freq: Every day | INTRAMUSCULAR | Status: DC
  Filled 2015-01-25: qty 0.2

## 2015-01-25 MED ORDER — ENSURE ENLIVE PO LIQD
237.0000 mL | Freq: Two times a day (BID) | ORAL | Status: DC
  Administered 2015-01-26 – 2015-01-27 (×3): 237 mL via ORAL

## 2015-01-25 MED ORDER — ATORVASTATIN CALCIUM 80 MG PO TABS: 80.0000 mg | ORAL_TABLET | Freq: Every day | ORAL | Status: DC

## 2015-01-25 MED ORDER — METHYLPREDNISOLONE SODIUM SUCC 125 MG IJ SOLR
60.0000 mg | Freq: Four times a day (QID) | INTRAMUSCULAR | Status: DC
  Administered 2015-01-25 – 2015-01-27 (×7): 60 mg via INTRAVENOUS
  Filled 2015-01-25 (×7): qty 2

## 2015-01-25 MED ORDER — MORPHINE SULFATE (CONCENTRATE) 10 MG/0.5ML PO SOLN: 10.0000 mg | ORAL | Status: DC | PRN

## 2015-01-25 MED ORDER — VITAMIN B-1 100 MG PO TABS: 100.0000 mg | ORAL_TABLET | Freq: Every day | ORAL | Status: DC

## 2015-01-25 MED ORDER — DILTIAZEM LOAD VIA INFUSION
10.0000 mg | Freq: Once | INTRAVENOUS | Status: AC
  Administered 2015-01-25: 10 mg via INTRAVENOUS
  Filled 2015-01-25: qty 10

## 2015-01-25 MED ORDER — DILTIAZEM HCL 100 MG IV SOLR
5.0000 mg/h | INTRAVENOUS | Status: DC
  Administered 2015-01-25: 5 mg/h via INTRAVENOUS
  Administered 2015-01-25: 15 mg/h via INTRAVENOUS
  Administered 2015-01-26: 5 mg/h via INTRAVENOUS
  Filled 2015-01-25 (×2): qty 100

## 2015-01-25 MED ORDER — ASPIRIN EC 81 MG PO TBEC
81.0000 mg | DELAYED_RELEASE_TABLET | Freq: Every day | ORAL | Status: DC
  Administered 2015-01-26 – 2015-01-28 (×3): 81 mg via ORAL
  Filled 2015-01-25 (×3): qty 1

## 2015-01-25 MED ORDER — LEVALBUTEROL HCL 0.63 MG/3ML IN NEBU
0.6300 mg | INHALATION_SOLUTION | Freq: Once | RESPIRATORY_TRACT | Status: DC
  Filled 2015-01-25: qty 3

## 2015-01-25 MED ORDER — HYDROMORPHONE HCL 1 MG/ML IJ SOLN
0.2500 mg | INTRAMUSCULAR | Status: DC
  Administered 2015-01-25 – 2015-01-26 (×4): 0.25 mg via INTRAVENOUS
  Filled 2015-01-25 (×4): qty 1

## 2015-01-25 MED ORDER — SODIUM CHLORIDE 0.9 % IV SOLN
Freq: Once | INTRAVENOUS | Status: AC
  Administered 2015-01-25: 100 mL/h via INTRAVENOUS

## 2015-01-25 MED ORDER — MORPHINE SULFATE (PF) 4 MG/ML IV SOLN
4.0000 mg | INTRAVENOUS | Status: DC | PRN
  Administered 2015-01-25 (×2): 4 mg via INTRAVENOUS
  Filled 2015-01-25 (×3): qty 1

## 2015-01-25 MED ORDER — DIAZEPAM 5 MG PO TABS
5.0000 mg | ORAL_TABLET | Freq: Two times a day (BID) | ORAL | Status: DC | PRN
Start: 2015-01-25 — End: 2015-01-25
  Filled 2015-01-25: qty 1

## 2015-01-25 MED ORDER — MORPHINE SULFATE (PF) 4 MG/ML IV SOLN
4.0000 mg | INTRAVENOUS | Status: AC | PRN
  Administered 2015-01-25: 4 mg via INTRAVENOUS
  Filled 2015-01-25: qty 1

## 2015-01-25 MED ORDER — SODIUM CHLORIDE 0.9 % IJ SOLN
3.0000 mL | Freq: Two times a day (BID) | INTRAMUSCULAR | Status: DC
Start: 2015-01-25 — End: 2015-01-28
  Administered 2015-01-25 – 2015-01-27 (×5): 3 mL via INTRAVENOUS

## 2015-01-25 MED ORDER — ALBUTEROL SULFATE (2.5 MG/3ML) 0.083% IN NEBU
5.0000 mg | INHALATION_SOLUTION | Freq: Once | RESPIRATORY_TRACT | Status: AC
  Administered 2015-01-25: 5 mg via RESPIRATORY_TRACT
  Filled 2015-01-25: qty 6

## 2015-01-25 MED ORDER — POTASSIUM CHLORIDE 10 MEQ/100ML IV SOLN
10.0000 meq | Freq: Once | INTRAVENOUS | Status: AC
  Administered 2015-01-25: 10 meq via INTRAVENOUS
  Filled 2015-01-25: qty 100

## 2015-01-25 MED ORDER — FAMOTIDINE 20 MG PO TABS
20.0000 mg | ORAL_TABLET | Freq: Every day | ORAL | Status: DC
  Administered 2015-01-25 – 2015-01-27 (×3): 20 mg via ORAL
  Filled 2015-01-25 (×3): qty 1

## 2015-01-25 MED ORDER — ENOXAPARIN SODIUM 40 MG/0.4ML ~~LOC~~ SOLN
40.0000 mg | SUBCUTANEOUS | Status: DC
  Administered 2015-01-25: 40 mg via SUBCUTANEOUS
  Filled 2015-01-25: qty 0.4

## 2015-01-25 MED ORDER — EZETIMIBE 10 MG PO TABS
10.0000 mg | ORAL_TABLET | Freq: Every day | ORAL | Status: DC
  Administered 2015-01-26 – 2015-01-28 (×3): 10 mg via ORAL
  Filled 2015-01-25 (×3): qty 1

## 2015-01-25 MED ORDER — PANTOPRAZOLE SODIUM 40 MG PO TBEC
40.0000 mg | DELAYED_RELEASE_TABLET | Freq: Two times a day (BID) | ORAL | Status: DC
  Administered 2015-01-25 – 2015-01-28 (×6): 40 mg via ORAL
  Filled 2015-01-25 (×6): qty 1

## 2015-01-25 MED ORDER — LEVALBUTEROL HCL 0.63 MG/3ML IN NEBU
0.6300 mg | INHALATION_SOLUTION | RESPIRATORY_TRACT | Status: AC
  Administered 2015-01-25: 0.63 mg via RESPIRATORY_TRACT
  Filled 2015-01-25: qty 3

## 2015-01-25 MED ORDER — BUDESONIDE-FORMOTEROL FUMARATE 160-4.5 MCG/ACT IN AERO
2.0000 | INHALATION_SPRAY | Freq: Two times a day (BID) | RESPIRATORY_TRACT | Status: DC
  Administered 2015-01-26 – 2015-01-28 (×5): 2 via RESPIRATORY_TRACT
  Filled 2015-01-25 (×2): qty 6

## 2015-01-25 MED ORDER — LORATADINE 10 MG PO TABS: 10.0000 mg | ORAL_TABLET | Freq: Every day | ORAL | Status: DC | PRN

## 2015-01-25 MED ORDER — FLUCONAZOLE 100 MG PO TABS
100.0000 mg | ORAL_TABLET | Freq: Every day | ORAL | Status: DC
  Administered 2015-01-25 – 2015-01-27 (×3): 100 mg via ORAL
  Filled 2015-01-25 (×4): qty 1

## 2015-01-25 MED ORDER — LORAZEPAM 2 MG/ML IJ SOLN: 1.0000 mg | INTRAMUSCULAR | Status: DC | PRN

## 2015-01-25 MED ORDER — DIAZEPAM 5 MG PO TABS
5.0000 mg | ORAL_TABLET | Freq: Three times a day (TID) | ORAL | Status: DC | PRN
  Administered 2015-01-25: 5 mg via ORAL

## 2015-01-25 MED ORDER — THIAMINE HCL 100 MG/ML IJ SOLN: 100.0000 mg | Freq: Every day | INTRAMUSCULAR | Status: DC

## 2015-01-25 MED ORDER — METHYLPREDNISOLONE SODIUM SUCC 125 MG IJ SOLR
125.0000 mg | Freq: Once | INTRAMUSCULAR | Status: AC
  Administered 2015-01-25: 125 mg via INTRAVENOUS

## 2015-01-25 MED ORDER — VITAMIN B-1 100 MG PO TABS
100.0000 mg | ORAL_TABLET | Freq: Every day | ORAL | Status: DC
  Administered 2015-01-26 – 2015-01-28 (×3): 100 mg via ORAL
  Filled 2015-01-25 (×4): qty 1

## 2015-01-25 MED ORDER — BOOST / RESOURCE BREEZE PO LIQD
1.0000 | Freq: Three times a day (TID) | ORAL | Status: DC
  Administered 2015-01-25 – 2015-01-27 (×3): 1 via ORAL

## 2015-01-25 MED ORDER — MORPHINE SULFATE (PF) 2 MG/ML IV SOLN
1.0000 mg | INTRAVENOUS | Status: DC | PRN
  Administered 2015-01-25: 2 mg via INTRAVENOUS
  Filled 2015-01-25: qty 1

## 2015-01-25 MED ORDER — GUAIFENESIN 100 MG/5ML PO SYRP
200.0000 mg | ORAL_SOLUTION | ORAL | Status: DC | PRN
  Administered 2015-01-26: 200 mg via ORAL
  Filled 2015-01-25 (×2): qty 10

## 2015-01-25 MED ORDER — METHYLPREDNISOLONE SODIUM SUCC 125 MG IJ SOLR
125.0000 mg | Freq: Once | INTRAMUSCULAR | Status: DC
  Filled 2015-01-25: qty 2

## 2015-01-25 MED ORDER — ONDANSETRON HCL 4 MG/2ML IJ SOLN
4.0000 mg | Freq: Once | INTRAMUSCULAR | Status: AC
  Administered 2015-01-25: 4 mg via INTRAVENOUS
  Filled 2015-01-25: qty 2

## 2015-01-25 MED ORDER — LORAZEPAM 2 MG/ML IJ SOLN
0.5000 mg | INTRAMUSCULAR | Status: DC | PRN
  Administered 2015-01-26: 0.5 mg via INTRAVENOUS
  Filled 2015-01-25: qty 1

## 2015-01-25 NOTE — ED Notes (Addendum)
Per EMS: pt comes from home c/o irregular HR/SVT.  Pt is on hospice for COPD, on 4L O2 at home.  Pt began experiencing chest discomfort and daughter called EMS.  PT states she's had this before, but has never been cardioverted before.  Allergic to amiodarone.  Pt has 20g LAC, given 250 NS bolus. HR went from 135-150s to 108 after fluids.  Pt A&O x 4.

## 2015-01-25 NOTE — ED Notes (Signed)
Dr. Jeneen Rinks notified of pt respiratory assessment and pt request for breathing tx, see new orders. Primary RN notified.

## 2015-01-25 NOTE — Consult Note (Signed)
CARDIOLOGY CONSULT NOTE   Patient ID: [name] MRN: [id number] DOB/AGE: 30-May-1950 64 y.o.  Admit date: 01/25/2015  Primary Physician   Tivis Ringer, MD Primary Cardiologist   Dr Gwenlyn Found Reason for Consultation    [id number] [name] is a 64 y.o. year old female with a history of ischemic cardiomyopathy EF 45%, CAD s/p CABG and subsequent interventions, Stage III NSCLC s/p chemoradiation, HLD, PVD, tobacco abuse and COPD on 4L [place]. She continues to smoke.  She was discharged 08/19 after a 2 day admission for chest pain. Cards saw, EF decreased slightly at 45-50%, medical therapy recommended.   [name] has had palpitations for months. No mention of any arrhythmia is made during her recent admission.  [name] states that the palpitations have been more frequent and lasting longer. Today, she became extremely short of breath and was wheezing. The palpitations were worse. She stated she was treating the palpitations with nebulizers and drinking plenty of water.  She came to the hospital where she was in and out of an atrial tachycardia. There is a question of MAT on telemetry strips. She is symptomatic with this and gets a burning in her nose and in the back of her throat that her family states is her angina. She continues to have the burning nasal pain even when her heart rate is normal. Initial enzymes are negative and ECG is not acute.  She was initially treated in the emergency room with nebulizers, IV fluids and IV Cardizem as well as potassium and morphine. Her wheezing briefly improved but has started again.  She is being followed by hospice at home, but is not fully [facility].  Past Medical History  Diagnosis Date  . Acute myocardial infarction, unspecified site, episode of care unspecified 2011    VF arrest. Ruled in, cath with no clear culprit lesion, inferior WMA on LV gram, medical therapy  . Esophageal dysmotility   . Hemorrhoids   . Esophageal  stricture   . Diaphragmatic hernia without mention of obstruction or gangrene   . 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 CAD (coronary artery disease)     CABG'96., Xience DES -VG-RCA (01/12/13)  . Peripheral neuropathy   . Hyperlipidemia   . Peptic ulcer, unspecified site, unspecified as acute or chronic, without mention of hemorrhage, perforation, or obstruction   . Depressive disorder, not elsewhere classified   . Anxiety state, unspecified   . COPD (chronic obstructive pulmonary disease)   . GERD (gastroesophageal reflux disease)   . Candida esophagitis     on multiple occasions  . C. difficile diarrhea   . PVD (peripheral vascular disease)   . Headache(784.0)   . Weight loss 06/13/2011    scheduled here by family doctor  . CHF (congestive heart failure) 2011  . Asthma   . Hypertension   . History of nuclear stress test 07/14/2010    dipyriadmole; normal pattern of perfusion, low risk   . History of tobacco use     quit 05/2009  . Non-small cell carcinoma of lung, stage 3     Dr. Roxan Hockey  . History of radiation therapy 02/28/2012-04/14/2012    right lung  . Bilateral carotid artery disease   . TIA (transient ischemic attack)     amaurosis fugax  . Tubular adenoma of colon   . On home oxygen therapy     "4L; 24/7" (01/08/2015)  . Atrial fibrillation      Past  Surgical History  Procedure Laterality Date  . Tonsillectomy  1974  . Mandible fracture surgery Left 1976  . Breast surgery Bilateral 1969, 1970    "tumors"  . Epigastric hernia repair  2008    Dr Roxan Hockey  . Coronary artery bypass graft  02/01/1994    left internal mammary to LAD, vein graft to diagonal/post descending, post lateral, marginals, circumfle, diagonal  . Carpal tunnel release    . Laminectomy    . Subclavian vein angioplasty / stenting Left   . Transthoracic echocardiogram  08/14/2009    EF=>55%, normal LV systolic function, mild hypokinesis of basal segments of inferolateral &  anterolater walls; mild MR/TR; AV mildly sclerotic  . Coronary angioplasty with stent placement  08/29/2000    stent to RCA (Dr. Corky Downs)  . Cardiac catheterization  01/17/2002    patent grafts (Dr. Marella Chimes)  . Cardiac catheterization  02/13/2004    significant L main & 3 vessel CAD, patents grafts, mild distal aortic disease (Dr. Gerrie Nordmann)  . Cardiac catheterization  07/14/2005    patent grafts, normal LV function (Dr. Adora Fridge)  . Cardiac catheterization  09/26/2008    patent grafts, normal LV function (Dr. Adora Fridge)  . Cardiac catheterization  05/31/2009    patent grafts, inferior wall motion abnormality (Dr. Adora Fridge)  . Cardiac catheterization  10/03/2009    normal LV function; 100% native RCA, circumflex, distal LAD occlusion  . Coronary angioplasty with stent placement  01/12/2013    Xience DES to SVG-RCA distal  . Carotid doppler  08/14/2009    R & L ICAs - 0-49% diameter reduction   . Left heart catheterization with coronary/graft angiogram N/A 01/12/2013    Procedure: LEFT HEART CATHETERIZATION WITH Beatrix Fetters;  Surgeon: Leonie Man, MD;  Location: [facility] CATH LAB;  Service: Cardiovascular;  Laterality: N/A;  . Vaginal hysterectomy  1998    Allergies  Allergen Reactions  . Amiodarone Other (See Comments)    Drops HR too low, SOB, fatigue  . Clopidogrel Bisulfate Hives  . Dilaudid [Hydromorphone Hcl] Other (See Comments)    "I couldn't function for 2 days."  . Multaq [Dronedarone] Other (See Comments)    Heart races     I have reviewed the patient's current medications   . diltiazem (CARDIZEM) infusion    . diltiazem (CARDIZEM) infusion 10 mg/hr (01/25/15 1300)   morphine injection  Medication Sig  albuterol (PROAIR HFA) 108 (90 BASE) MCG/ACT inhaler Inhale 1-2 puffs into the lungs every 6 (six) hours as needed for wheezing or shortness of breath.  albuterol (PROVENTIL) (2.5 MG/3ML) 0.083% nebulizer solution Take 3 mLs (2.5 mg total) by nebulization every  6 (six) hours as needed for wheezing or shortness of breath.  aspirin EC 81 MG tablet Take 81 mg by mouth daily.  atorvastatin (LIPITOR) 80 MG tablet Take 80 mg by mouth at bedtime.  diazepam (VALIUM) 5 MG tablet Take 5 mg by mouth 2 (two) times daily as needed for anxiety.   escitalopram (LEXAPRO) 10 MG tablet Take 1 tablet (10 mg total) by mouth daily. Patient not taking: Reported on 01/08/2015  famotidine (PEPCID) 20 MG tablet Take 1 tablet (20 mg total) by mouth at bedtime.  fluconazole (DIFLUCAN) 100 MG tablet Take 1 tablet (100 mg total) by mouth daily.  Fluticasone Furoate-Vilanterol (BREO ELLIPTA) 100-25 MCG/INH AEPB Inhale 1 puff into the lungs 2 (two) times daily.  HYDROcodone-acetaminophen (NORCO) 10-325 MG per tablet Take 1-2 tablets by mouth every 6 (  six) hours as needed for moderate pain.  levofloxacin (LEVAQUIN) 500 MG tablet Take 1 tablet (500 mg total) by mouth daily.  loratadine (CLARITIN) 10 MG tablet Take 1 tablet (10 mg total) by mouth daily as needed for allergies.  nitroGLYCERIN (NITROSTAT) 0.4 MG SL tablet Place 0.4 mg under the tongue every 5 (five) minutes as needed for chest pain.   nortriptyline (PAMELOR) 25 MG capsule Take 1 capsule (25 mg total) by mouth at bedtime. Patient not taking: Reported on 01/08/2015  ondansetron (ZOFRAN) 4 MG tablet Take 1 tablet (4 mg total) by mouth every 8 (eight) hours as needed for nausea or vomiting.  OXYGEN-HELIUM IN Inhale 4 L into the lungs continuous.  pantoprazole (PROTONIX) 40 MG tablet Take 1 tablet (40 mg total) by mouth 2 (two) times daily.  predniSONE (DELTASONE) 20 MG tablet Take 1 tablet (20 mg total) by mouth daily with breakfast.  SPIRIVA HANDIHALER 18 MCG inhalation capsule PLACE 1 CAPSULE (18 MCG TOTAL) INTO INHALER AND INHALE DAILY.  SYMBICORT 160-4.5 MCG/ACT inhaler INHALE 2 PUFFS INTO THE LUNGS TWICE A DAY THEN RINSE AFTER DOSING  ZETIA 10 MG tablet TAKE 1 TABLET BY MOUTH EVERY DAY     Social History   Social  History  . Marital Status: Widowed    Spouse Name: N/A  . Number of Children: 2  . Years of Education: N/A   Occupational History  . disabled     back problems   Social History Main Topics  . Smoking status: Current Every Day Smoker -- 1.50 packs/day for 30 years    Types: Cigarettes  . Smokeless tobacco: Never Used  . Alcohol Use: No  . Drug Use: Yes    Special: Flunitrazepam  . Sexual Activity: Not on file   Other Topics Concern  . Not on file   Social History Narrative   Married with 2 children, husband has Alzheimer's dementia, retired now, previously Corporate treasurer, Quit smoking    Family Status  Relation Status Death Age  . Father Deceased   . Mother Deceased   . Brother Deceased    Family History  Problem Relation Age of Onset  . Lung cancer Father     also CVA  . Heart disease Father   . Asthma Father   . Allergies Father   . Cancer Father   . Heart disease Mother     also CVA, MI  . Brain cancer Mother   . Heart disease Sister   . Colon cancer Neg Hx   . Lung cancer Brother 51    also ENT cancer  . Hypertension Brother 10    also aneurysm, early CAD  . Breast cancer Other     family hx     ROS:  Full 14 point review of systems complete and found to be negative unless listed above.  Physical Exam: Blood pressure 132/65, pulse 83, resp. rate 17, height '5\' 6"'$  (1.676 m), weight 98 lb (44.453 kg), SpO2 100 %.  General: Well developed, frail, cachectic, female in mild respiratory distress Head: Eyes PERRLA, No xanthomas.   Normocephalic and atraumatic, oropharynx without edema or exudate. Dentition: Poor Lungs: Bibasilar Rales and wheezing Heart: Heart slightly irregular rate and rhythm with S1, S2  murmur. pulses are 2+ all 4 extrem.   Neck: No carotid bruits. No lymphadenopathy.  JVD elevated but difficult to assess because patient is very comfortable when she tries to sit higher. Abdomen: Bowel sounds present, abdomen protuberant, soft and  non-tender without masses or hernias noted. Msk:  No spine or cva tenderness. Generalized weakness, no joint deformities or effusions. Extremities: No clubbing or cyanosis.  edema.  Neuro: Alert and oriented X 3. No focal deficits noted. Psych: Responds appropriately Skin: No rashes or lesions noted.  Labs:   Lab Results  Component Value Date   WBC 7.1 01/25/2015   HGB 13.2 01/25/2015   HCT 39.6 01/25/2015   MCV 94.1 01/25/2015   PLT 165 01/25/2015    Recent Labs  01/25/15 1005  INR 1.12     Recent Labs Lab 01/25/15 1005  NA 134*  K 3.3*  CL 92*  CO2 35*  BUN 7  CREATININE 0.53  CALCIUM 8.7*  GLUCOSE 102*    Recent Labs  01/25/15 1005  TROPONINI 0.03   B NATRIURETIC PEPTIDE  Date/Time Value Ref Range Status  01/25/2015 10:06 AM 634.8* 0.0 - 100.0 pg/mL Final  01/08/2015 09:18 AM 261.2* 0.0 - 100.0 pg/mL Final   Echo: 01/09/2015 - Procedure narrative: Transthoracic echocardiography. Image quality was adequate. The study was technically difficult. - Left ventricle: The cavity size was normal. Systolic function was mildly reduced. The estimated ejection fraction was in the range of 45% to 50%. Dyskinesis of the anteroseptal myocardium. Hypokinesis of the anterior myocardium. Left ventricular diastolic function parameters were normal. - Aortic valve: There was trivial regurgitation. - Mitral valve: There was mild regurgitation. - Left atrium: The atrium was mildly dilated. - Right ventricle: Systolic function was mildly reduced.  ECG:  01/25/2015 Atrial flutter vs Atrial tach Vent. rate 148 BPM PR interval * ms QRS duration 80 ms QT/QTc 300/471 ms P-R-T axes -1 73 86  Radiology:  Ct Angio Chest Pe W/cm &/or Wo Cm 01/25/2015   CLINICAL DATA:  Shortness of breath.  COPD.  EXAM: CT ANGIOGRAPHY CHEST WITH CONTRAST  TECHNIQUE: Multidetector CT imaging of the chest was performed using the standard protocol during bolus administration of intravenous  contrast. Multiplanar CT image reconstructions and MIPs were obtained to evaluate the vascular anatomy.  CONTRAST:  92m OMNIPAQUE IOHEXOL 350 MG/ML SOLN  COMPARISON:  10/05/2013, 06/19/2014, 08/04/2012  FINDINGS: There is adequate opacification of the pulmonary arteries. There is no pulmonary embolus. The main pulmonary artery, right main pulmonary artery and left main pulmonary arteries are normal in size. The heart size is normal. There is no pericardial effusion. Normal caliber thoracic aorta. Scratched There is evidence of prior CABG. There is a patent left subclavian artery stents.  There is bilateral centrilobular emphysema. Stable spiculated nodule in the right upper lobe which is unchanged compared with the prior exam measuring 10 x 8 mm. Extending from the spiculated nodule is a subpleural soft tissue density along the right major fissure measuring 18 x 12 mm, unchanged compared with the prior exam of 10/05/2013. There is mild left basilar scarring. There is no focal consolidation, pleural effusion or pneumothorax.  There is esophageal wall thickening which may reflect esophagitis.  There is no axillary, hilar, or mediastinal adenopathy.  There is no lytic or blastic osseous lesion. Partially visualized is posterior thoracolumbar fusion. There is a chronic T10 vertebral body compression fracture.  The visualized portions of the upper abdomen are unremarkable.  Review of the MIP images confirms the above findings.  IMPRESSION: 1. No evidence of pulmonary embolus. 2. Esophageal wall thickening as can be seen with esophagitis. 3. Stable spiculated nodule and subpleural soft tissue density along the right major fissure.   Electronically Signed   By: HElbert Ewings  Patel   On: 01/25/2015 11:39   Dg Chest Port 1 View 01/25/2015   CLINICAL DATA:  64 year old female with shortness of breath. Irregular heart rate. Supraventricular tachycardia. COPD on oxygen.  EXAM: PORTABLE CHEST - 1 VIEW  COMPARISON:  Multiple priors,  most recently chest x-ray 01/09/2015.  FINDINGS: Lungs appear hyperexpanded with mild emphysematous changes. Chronic scarring in the medial aspect of the right upper lobe contributing to opacity in the right paratracheal region, with chronic elevation of the right hemidiaphragm, with chronic elevation of the minor fissure, similar prior examinations. Left lung appears clear. Lung bases are not incompletely visualized. No large pleural effusions (small pleural effusions are not excluded secondary to incomplete visualization of the lower thorax). Mild diffuse peribronchial cuffing, similar to prior examinations (chronic). No evidence of pulmonary edema. Heart size is upper limits of normal, likely accentuated by portable AP technique and lordotic positioning. Atherosclerosis in the thoracic aorta. Status post median sternotomy for CABG.  IMPRESSION: 1. Chronic changes in the lungs, similar prior studies, as above. No definite radiographic evidence of acute cardiopulmonary disease. 2. Mild diffuse peribronchial cuffing and emphysematous changes; imaging findings compatible with the reported clinical history of underlying COPD. 3. Atherosclerosis.   Electronically Signed   By: Vinnie Langton M.D.   On: 01/25/2015 09:58    ASSESSMENT AND PLAN:   The patient was seen today by Dr. Stanford Breed, the patient evaluated and the data reviewed.  Active Problems:   COPD exacerbation - may have caused the increased arrhythmia - per ER MD/IM    Atrial tachycardia - Pt in/out - tolerating Cardizem, continue to titrate gtt to minimize episodes - would continue Cardizem, change to PO once more stablilized - When ready for d/c, can be on Cardizem CD plus PRN Cardizem 30 or 60 mg.  SignedRosaria Ferries, Hershal Coria 01/25/2015 1:21 PM Beeper [phone]  Co-Sign MD As above, patient seen and examined. Briefly she is a 64 year old female with past medical history of severe COPD home O2 dependent, lung cancer, coronary artery  disease status post coronary artery bypass graft, peripheral vascular disease, now on hospice care for evaluation of SVT. Over the past week she complains of episodes of palpitations described as her heart racing. There is an associated chest burning as well as increased dyspnea. They have lasted approximately 15 min to 1 hour and resolved. Symptoms recurred this morning and would not break. She presented to the emergency room. Presently in sinus rhythm. Electrocardiogram shows SVT possibly ectopic atrial tachycardia. Patient was started on IV Cardizem and she converted to sinus. Would continue for now. Transition to oral formula tomorrow. She will be discharged on Cardizem CD. Note given lung disease would avoid beta blockade. She is a no CODE BLUE and on hospice. No further aggressive cardiac evaluation. Kirk Ruths

## 2015-01-25 NOTE — Progress Notes (Signed)
Patient is currently on 4L[place], sats are 97%, and patient is in no distress. Bipap is not needed at this time. Will continue to monitor.

## 2015-01-25 NOTE — Evaluation (Signed)
Clinical/Bedside Swallow Evaluation Patient Details  Name: [name] MRN: [id number] Date of Birth: December 25, 1950  Today's Date: 01/25/2015 Time: SLP Start Time (ACUTE ONLY): 53 SLP Stop Time (ACUTE ONLY): 1605 SLP Time Calculation (min) (ACUTE ONLY): 15 min  Past Medical History:  Past Medical History  Diagnosis Date  . Acute myocardial infarction, unspecified site, episode of care unspecified 2011    VF arrest. Ruled in, cath with no clear culprit lesion, inferior WMA on LV gram, medical therapy  . Esophageal dysmotility   . Hemorrhoids   . Esophageal stricture   . Diaphragmatic hernia without mention of obstruction or gangrene   . 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 CAD (coronary artery disease)     CABG'96., Xience DES -VG-RCA (01/12/13)  . Peripheral neuropathy   . Hyperlipidemia   . Peptic ulcer, unspecified site, unspecified as acute or chronic, without mention of hemorrhage, perforation, or obstruction   . Depressive disorder, not elsewhere classified   . Anxiety state, unspecified   . COPD (chronic obstructive pulmonary disease)   . GERD (gastroesophageal reflux disease)   . Candida esophagitis     on multiple occasions  . C. difficile diarrhea   . PVD (peripheral vascular disease)   . Headache(784.0)   . Weight loss 06/13/2011    scheduled here by family doctor  . CHF (congestive heart failure) 2011  . Asthma   . Hypertension   . History of nuclear stress test 07/14/2010    dipyriadmole; normal pattern of perfusion, low risk   . History of tobacco use     quit 05/2009  . Non-small cell carcinoma of lung, stage 3     Dr. Roxan Hockey  . History of radiation therapy 02/28/2012-04/14/2012    right lung  . Bilateral carotid artery disease   . TIA (transient ischemic attack)     amaurosis fugax  . Tubular adenoma of colon   . On home oxygen therapy     "4L; 24/7" (01/08/2015)  . Atrial fibrillation    Past Surgical History:  Past Surgical  History  Procedure Laterality Date  . Tonsillectomy  1974  . Mandible fracture surgery Left 1976  . Breast surgery Bilateral 1969, 1970    "tumors"  . Epigastric hernia repair  2008    Dr Roxan Hockey  . Coronary artery bypass graft  02/01/1994    left internal mammary to LAD, vein graft to diagonal/post descending, post lateral, marginals, circumfle, diagonal  . Carpal tunnel release    . Laminectomy    . Subclavian vein angioplasty / stenting Left   . Transthoracic echocardiogram  08/14/2009    EF=>55%, normal LV systolic function, mild hypokinesis of basal segments of inferolateral & anterolater walls; mild MR/TR; AV mildly sclerotic  . Coronary angioplasty with stent placement  08/29/2000    stent to RCA (Dr. Corky Downs)  . Cardiac catheterization  01/17/2002    patent grafts (Dr. Marella Chimes)  . Cardiac catheterization  02/13/2004    significant L main & 3 vessel CAD, patents grafts, mild distal aortic disease (Dr. Gerrie Nordmann)  . Cardiac catheterization  07/14/2005    patent grafts, normal LV function (Dr. Adora Fridge)  . Cardiac catheterization  09/26/2008    patent grafts, normal LV function (Dr. Adora Fridge)  . Cardiac catheterization  05/31/2009    patent grafts, inferior wall motion abnormality (Dr. Adora Fridge)  . Cardiac catheterization  10/03/2009    normal LV function; 100% native RCA, circumflex, distal LAD  occlusion  . Coronary angioplasty with stent placement  01/12/2013    Xience DES to SVG-RCA distal  . Carotid doppler  08/14/2009    R & L ICAs - 0-49% diameter reduction   . Left heart catheterization with coronary/graft angiogram N/A 01/12/2013    Procedure: LEFT HEART CATHETERIZATION WITH Beatrix Fetters;  Surgeon: Leonie Man, MD;  Location: [facility] CATH LAB;  Service: Cardiovascular;  Laterality: N/A;  . Vaginal hysterectomy  1998   HPI:  Pt is a 64 y.o. female with PMH of ischemic cardiomyopathy, known CAD and CABG status post drug-eluting stent to vein graft to RCA, known  stage III non-small cell lung cancer diagnosed in 2013 status post chemoradiation, end-stage COPD oxygen dependent, recurrent esophagitis in setting of known esophageal dysmotility and esophageal stricture and recurrent [name] ear is currently on Diflucan, continued tobacco abuse, ongoing depression and anxiety, dyslipidemia and severe protein calorie malnutrition, on hospice. Recently discharged on 8/19 after being admitted for chest pain. During that admission pt also noted to have acute oropharyngeal candidiasis. Pt re-admitted 9/3 with acute onset shortness of breath and chest pressure. Chest x-ray revealed underlying COPD without any focal changes. CT angiogram of the chest showed no pulmonary embolism, no focal infiltrates, no evidence of volume overload. Incidental finding of esophageal wall thickening as can be seen with esophagitis. Pt has been complaining of difficulty swallowing; MD report noted that pt could be experiencing occult aspiration which may be further exacerbating COPD symptoms. Pt had esophagram in August 2015 which showed no abnormalities. Bedside swallow eval ordered to further assess swallow function.    Assessment / Plan / Recommendation Clinical Impression  Pt showed no immediate s/s of aspiration at bedside; however, pt did cough intermittently throughout evaluation including prior to PO intake, and appeared SOB throughout eval. Unable to completely rule out aspiration. Pt initially denied any difficulties swallowing; daughter at bedside reported that pt has difficulties swallowing tough meats. Asked pt about this who confirmed globus sensation at times with regular solids, especially meats. Provided education on dysphagia 2 and 3 diet consistency options, explaining that they may allow pt to consume POs with greater ease. Pt rejected these options; daughter at bedside stated, "it's up to her." Explained aspiration risks associated with COPD; encouraged pt to take small bites/  sips at a time to reduce risk. Risk of aspiration does appear moderate-high given current respiratory status and SOB; however, pt is under hospice care and is requesting to remain on regular diet consistency with daughter in agreement. Recommend continuing regular diet/ thin liquids, providing intermittent supervision to ensure pt is taking small bites/ sips and to monitor O2s. Will sign off at this time. Please re-consult if needs arise.    Aspiration Risk   (moderate-severe)    Diet Recommendation Age appropriate regular solids;Thin   Medication Administration: Whole meds with liquid Compensations: Slow rate;Small sips/bites    Other  Recommendations Oral Care Recommendations: Oral care BID   Follow Up Recommendations       Frequency and Duration        Pertinent Vitals/Pain n/a    SLP Swallow Goals     Swallow Study Prior Functional Status       General Other Pertinent Information: Pt is a 64 y.o. female with PMH of ischemic cardiomyopathy, known CAD and CABG status post drug-eluting stent to vein graft to RCA, known stage III non-small cell lung cancer diagnosed in 2013 status post chemoradiation, end-stage COPD oxygen dependent, recurrent esophagitis in  setting of known esophageal dysmotility and esophageal stricture and recurrent [name] ear is currently on Diflucan, continued tobacco abuse, ongoing depression and anxiety, dyslipidemia and severe protein calorie malnutrition, on hospice. Recently discharged on 8/19 after being admitted for chest pain. During that admission pt also noted to have acute oropharyngeal candidiasis. Pt re-admitted 9/3 with acute onset shortness of breath and chest pressure. Chest x-ray revealed underlying COPD without any focal changes. CT angiogram of the chest showed no pulmonary embolism, no focal infiltrates, no evidence of volume overload. Incidental finding of esophageal wall thickening as can be seen with esophagitis. Pt has been complaining of  difficulty swallowing; MD report noted that pt could be experiencing occult aspiration which may be further exacerbating COPD symptoms. Pt had esophagram in August 2015 which showed no abnormalities. Bedside swallow eval ordered to further assess swallow function.  Type of Study: Bedside swallow evaluation Previous Swallow Assessment: esophagram 2015- negative Diet Prior to this Study: Regular;Thin liquids Temperature Spikes Noted: No Respiratory Status: Supplemental O2 delivered via (comment) History of Recent Intubation: No Behavior/Cognition: Alert;Cooperative Oral Cavity - Dentition: Adequate natural dentition/normal for age Self-Feeding Abilities: Able to feed self Patient Positioning: Other (comment) (sitting EOB) Baseline Vocal Quality: Breathy Volitional Cough: Strong Volitional Swallow: Able to elicit    Oral/Motor/Sensory Function Overall Oral Motor/Sensory Function: Appears within functional limits for tasks assessed Labial ROM: Within Functional Limits Labial Symmetry: Within Functional Limits Labial Strength: Within Functional Limits Labial Sensation: Within Functional Limits Lingual ROM: Within Functional Limits Lingual Symmetry: Within Functional Limits Lingual Strength: Within Functional Limits Lingual Sensation: Within Functional Limits   Ice Chips Ice chips: Not tested   Thin Liquid Thin Liquid: Within functional limits Presentation: Straw    Nectar Thick Nectar Thick Liquid: Not tested   Honey Thick Honey Thick Liquid: Not tested   Puree Puree: Within functional limits   Solid   GO    Solid: Impaired Presentation: Self Fed Pharyngeal Phase Impairments: Cough - Delayed       Kern Reap, MA, CCC-SLP 01/25/2015,4:09 PM [phone]

## 2015-01-25 NOTE — Progress Notes (Signed)
PT Cancellation Note  Patient Details Name: [name] MRN: [id number] DOB: 02/09/1951   Cancelled Treatment:    Reason Eval/Treat Not Completed: Medical issues which prohibited therapy.  Attempted to see patient.  Currently patient extremely SOB.  Will hold PT today.  Will return tomorrow for PT evaluation if appropriate for patient.  Thank you.   Despina Pole 01/25/2015, 6:12 PM Carita Pian. Sanjuana Kava, [place] Pager [phone]

## 2015-01-25 NOTE — Consult Note (Signed)
Consultation Note Date: 01/25/2015   Patient Name: [name]  DOB: June 07, 1950  MRN: [id number]  Age / Sex: 64 y.o., female   PCP: Prince Solian, MD Referring Physician: Kelvin Cellar, MD  Reason for Consultation: Establishing goals of care, Non pain symptom management, Pain control, Psychosocial/spiritual support and Terminal care  Palliative Care Assessment and Plan Summary of Established Goals of Care and Medical Treatment Preferences   Clinical Assessment/Narrative: Patient is a 64 year old female admitted in acute respiratory distress today . Patient has a significant history of end-stage COPD, oxygen dependency as well as non-small cell stage III lung cancer. She has recently been under the care of hospice of [facility] home care division. Patient was also complaining of chest pressure when she was admitted and was found to be in a tachycardic rhythm with a rate of 180. This is a very unfortunate situation. Patient's husband died of cancer in 07-11-22 and her son overdosed of heroin in July 11, 2022. Her sister is at the bedside and she is ill wearing oxygen as well as her niece who has cancer and is wearing oxygen. Patient lives with her 22 year old grandson for whom she is his primary caregiver. She does have a daughter who came with her to the emergency room today. She is currently on a Cardizem drip and her heart rate is in the 70s. She is very short of breath although she subjectively states she feels better. She is wheezing airway sounds very tight. She is unable to lie flat and is leaning forward on the bedside table. She has a moist congested cough. Her knees are mottled. I did share with the patient that I was very worried about her how sick she was. She herself stated that she feels like she is dying. I stated that that was my fear is well. Family and patient share with me that she does not want to die in the home because of her 36 year old grandson. She is telling me tonight that  she still wants to go home but wants to go to an inpatient hospice facility "when it's my time". I shared with her sister and niece that I feared if things were to continue to go the way they are now she could potentially have a prognosis of hours to just days. She is denying chest pain  Contacts/Participants in Discussion: Primary Decision Maker: Patient at this point can still make some of her decisions is able to participate in the assessment. My concern is that window is rapidly closing.   HCPOA: no  Her daughter [name] by default is her Air traffic controller. Present for tonight's conversation in addition to patient is her sister as well as her niece. I will touch base with her daughter tonight as to my clinical impression and concern that patient is transitioning towards end-of-life  Code Status/Advance Care Planning:  DO NOT RESUSCITATE  Patient does not want to die at home. I do think that she is within hours to days of end-of-life. I will discuss this with her daughter and recommended inpatient hospice.  Continue comfort based care. Continue the Cardizem drip as it is providing symptomatic relief from tachycardia.  Symptom Management:   Dyspnea: DC morphine. This causes patient to have a severe headache. Patient has been averaging at home at least 40-50 mg of oxycodone a day. We'll convert this to hydromorphone as an injectable option. Pt does show an intolerance to dilaudid in terms of " feeling out of it for 2 days" Spoke to MD  and daughter that this was likely due to high dose or rapid drug initiation. Also staffed with supervising MD, Dr. Domingo Cocking. All parties in agreement. Also discussed fentnayl as an option but concerned that this is too short acting iv given pt's current level of dyspnea. Daughter also leery of fentanyl because her brother died from heroin laced with fentanyl. We will reduce calculated dose by 25% for cross tolerance and start hydromorphone 0.25 every 4  around-the-clock with 0.25 2.5 every 2 when necessary for shortness of breath. We'll continue nebulizer treatments and add Decadron 8 mg daily starting on 01/26/2015  Pain: Patient has chronic back pain. We'll manage with hydromorphone; see above   Additional Recommendations (Limitations, Scope, Preferences):  In-patient hospice if pt survives Psycho-social/Spiritual:   Support System: Yes.  Desire for further Chaplaincy support:no  Prognosis: Hours - Days  Discharge Planning:  Need to discuss further with daughter [name], but would recommend hospice inpatient if she stabilizes in the hospital. If things are to continue to progress as they are now I would not be surprised if she has a prognosis of just hours to days. Given her stated wish that she does not wish to die at home I would recommend transfer from here to hospice inpatient.       Chief Complaint/History of Present Illness: Patient is a 64 year old female with a significant history of end-stage COPD O2 dependency as well as non-small cell  stage III lung cancer, coronary artery disease, MI, and CABG admitted after worsening shortness of breath. She is currently under the care of hospice of [facility]. She was reporting some chest pressure and pain and EMS was called. She was found to be tachycardic with a rate in the 180s upon arrival to the emergency room.  Primary Diagnoses  Present on Admission:  . Atrial tachycardia . (Resolved) COPD exacerbation . (Resolved) Tachycardia . Depression . Acute and chronic respiratory failure with hypoxia . Oropharyngeal candidiasis . CAD (coronary artery disease), autologous vein bypass graft, cath 01/12/13 with Xience DES to VG-RCA . Protein-calorie malnutrition, severe . Esophageal dysmotility . 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 Anxiety state . Hyperlipidemia . Hypertension . End stage COPD . COPD with acute exacerbation . Acute on chronic respiratory failure  with hypoxia  Palliative Review of Systems: Patient is having 2-3 word dyspnea. She is able to state that she is short of breath and that she is having back pain. She also is complaining of a headache I have reviewed the medical record, interviewed the patient and family, and examined the patient. The following aspects are pertinent.  Past Medical History  Diagnosis Date  . Acute myocardial infarction, unspecified site, episode of care unspecified 2011    VF arrest. Ruled in, cath with no clear culprit lesion, inferior WMA on LV gram, medical therapy  . Esophageal dysmotility   . Hemorrhoids   . Esophageal stricture   . Diaphragmatic hernia without mention of obstruction or gangrene   . 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 CAD (coronary artery disease)     CABG'96., Xience DES -VG-RCA (01/12/13)  . Peripheral neuropathy   . Hyperlipidemia   . Peptic ulcer, unspecified site, unspecified as acute or chronic, without mention of hemorrhage, perforation, or obstruction   . Depressive disorder, not elsewhere classified   . Anxiety state, unspecified   . COPD (chronic obstructive pulmonary disease)   . GERD (gastroesophageal reflux disease)   . Candida esophagitis     on  multiple occasions  . C. difficile diarrhea   . PVD (peripheral vascular disease)   . Headache(784.0)   . Weight loss 06/13/2011    scheduled here by family doctor  . CHF (congestive heart failure) 2011  . Asthma   . Hypertension   . History of nuclear stress test 07/14/2010    dipyriadmole; normal pattern of perfusion, low risk   . History of tobacco use     quit 05/2009  . Non-small cell carcinoma of lung, stage 3     Dr. Roxan Hockey  . History of radiation therapy 02/28/2012-04/14/2012    right lung  . Bilateral carotid artery disease   . TIA (transient ischemic attack)     amaurosis fugax  . Tubular adenoma of colon   . On home oxygen therapy     "4L; 24/7" (01/08/2015)  . Atrial fibrillation      Social History   Social History  . Marital Status: Widowed    Spouse Name: N/A  . Number of Children: 2  . Years of Education: N/A   Occupational History  . disabled     back problems   Social History Main Topics  . Smoking status: Current Every Day Smoker -- 1.50 packs/day for 30 years    Types: Cigarettes  . Smokeless tobacco: Never Used  . Alcohol Use: No  . Drug Use: Yes    Special: Flunitrazepam  . Sexual Activity: Not Asked   Other Topics Concern  . None   Social History Narrative   Married with 2 children, husband has Alzheimer's dementia, retired now, previously Corporate treasurer, Quit smoking   Family History  Problem Relation Age of Onset  . Lung cancer Father     also CVA  . Heart disease Father   . Asthma Father   . Allergies Father   . Cancer Father   . Heart disease Mother     also CVA, MI  . Brain cancer Mother   . Heart disease Sister   . Colon cancer Neg Hx   . Lung cancer Brother 37    also ENT cancer  . Hypertension Brother 3    also aneurysm, early CAD  . Breast cancer Other     family hx   Scheduled Meds: . [START ON 01/26/2015] aspirin EC  81 mg Oral Daily  . atorvastatin  80 mg Oral QHS  . enoxaparin (LOVENOX) injection  40 mg Subcutaneous Q24H  . [START ON 01/26/2015] ezetimibe  10 mg Oral Daily  . famotidine  20 mg Oral QHS  . feeding supplement  1 Container Oral TID BM  . feeding supplement (ENSURE ENLIVE)  237 mL Oral BID BM  . fluconazole  100 mg Oral Daily  . Fluticasone Furoate-Vilanterol  1 puff Inhalation BID  . folic acid  1 mg Oral Daily  . levalbuterol  0.63 mg Nebulization Q6H  . methylPREDNISolone (SOLU-MEDROL) injection  125 mg Intravenous Once  . methylPREDNISolone (SOLU-MEDROL) injection  60 mg Intravenous Q6H  . pantoprazole  40 mg Oral BID  . sodium chloride  3 mL Intravenous Q12H  . thiamine  100 mg Intravenous Daily  . [START ON 01/26/2015] tiotropium  18 mcg Inhalation Daily   Continuous  Infusions: . diltiazem (CARDIZEM) infusion 15 mg/hr (01/25/15 1729)   PRN Meds:.guaifenesin, loratadine, LORazepam, morphine injection, morphine CONCENTRATE, oxyCODONE Medications Prior to Admission:  Prior to Admission medications   Medication Sig Start Date End Date Taking? Authorizing Provider  albuterol (PROAIR HFA) 108 (  90 BASE) MCG/ACT inhaler Inhale 1-2 puffs into the lungs every 6 (six) hours as needed for wheezing or shortness of breath. 01/10/15   Kinnie Feil, MD  albuterol (PROVENTIL) (2.5 MG/3ML) 0.083% nebulizer solution Take 3 mLs (2.5 mg total) by nebulization every 6 (six) hours as needed for wheezing or shortness of breath. 01/10/15   Kinnie Feil, MD  aspirin EC 81 MG tablet Take 81 mg by mouth daily.    Historical Provider, MD  atorvastatin (LIPITOR) 80 MG tablet Take 80 mg by mouth at bedtime.    Historical Provider, MD  diazepam (VALIUM) 5 MG tablet Take 5 mg by mouth 2 (two) times daily as needed for anxiety.  10/31/14   Historical Provider, MD  escitalopram (LEXAPRO) 10 MG tablet Take 1 tablet (10 mg total) by mouth daily. Patient not taking: Reported on 01/08/2015 10/17/14   Lorretta Harp, MD  famotidine (PEPCID) 20 MG tablet Take 1 tablet (20 mg total) by mouth at bedtime. 12/11/14   Lafayette Dragon, MD  fluconazole (DIFLUCAN) 100 MG tablet Take 1 tablet (100 mg total) by mouth daily. 01/10/15   Kinnie Feil, MD  Fluticasone Furoate-Vilanterol (BREO ELLIPTA) 100-25 MCG/INH AEPB Inhale 1 puff into the lungs 2 (two) times daily.    Historical Provider, MD  HYDROcodone-acetaminophen (NORCO) 10-325 MG per tablet Take 1-2 tablets by mouth every 6 (six) hours as needed for moderate pain.    Historical Provider, MD  levofloxacin (LEVAQUIN) 500 MG tablet Take 1 tablet (500 mg total) by mouth daily. 01/10/15   Kinnie Feil, MD  loratadine (CLARITIN) 10 MG tablet Take 1 tablet (10 mg total) by mouth daily as needed for allergies. 03/05/14   Ripudeep Krystal Eaton, MD   nitroGLYCERIN (NITROSTAT) 0.4 MG SL tablet Place 0.4 mg under the tongue every 5 (five) minutes as needed for chest pain.     Historical Provider, MD  nortriptyline (PAMELOR) 25 MG capsule Take 1 capsule (25 mg total) by mouth at bedtime. Patient not taking: Reported on 01/08/2015 09/24/14   Britt Bottom, MD  ondansetron (ZOFRAN) 4 MG tablet Take 1 tablet (4 mg total) by mouth every 8 (eight) hours as needed for nausea or vomiting. 09/07/14   Tori Milks, MD  OXYGEN-HELIUM IN Inhale 4 L into the lungs continuous.    Historical Provider, MD  pantoprazole (PROTONIX) 40 MG tablet Take 1 tablet (40 mg total) by mouth 2 (two) times daily. 01/10/15   Kinnie Feil, MD  predniSONE (DELTASONE) 20 MG tablet Take 1 tablet (20 mg total) by mouth daily with breakfast. 01/10/15   Kinnie Feil, MD  SPIRIVA HANDIHALER 18 MCG inhalation capsule PLACE 1 CAPSULE (18 MCG TOTAL) INTO INHALER AND INHALE DAILY. 10/28/14   Noralee Space, MD  SYMBICORT 160-4.5 MCG/ACT inhaler INHALE 2 PUFFS INTO THE LUNGS TWICE A DAY THEN RINSE AFTER DOSING 08/23/14   Chesley Mires, MD  ZETIA 10 MG tablet TAKE 1 TABLET BY MOUTH EVERY DAY 03/25/14   Lorretta Harp, MD   Allergies  Allergen Reactions  . Amiodarone Other (See Comments)    Drops HR too low, SOB, fatigue  . Clopidogrel Bisulfate Hives  . Dilaudid [Hydromorphone Hcl] Other (See Comments)    "I couldn't function for 2 days."  . Multaq [Dronedarone] Other (See Comments)    Heart races    CBC:    Component Value Date/Time   WBC 7.1 01/25/2015 1005   WBC 7.7 11/14/2014 0953  HGB 13.2 01/25/2015 1005   HGB 12.9 11/14/2014 0953   HCT 39.6 01/25/2015 1005   HCT 38.2 11/14/2014 0953   PLT 165 01/25/2015 1005   PLT 192 11/14/2014 0953   MCV 94.1 01/25/2015 1005   MCV 95.7 11/14/2014 0953   NEUTROABS 5.8 01/25/2015 1005   NEUTROABS 6.0 11/14/2014 0953   LYMPHSABS 0.6* 01/25/2015 1005   LYMPHSABS 1.0 11/14/2014 0953   MONOABS 0.6 01/25/2015 1005   MONOABS 0.5  11/14/2014 0953   EOSABS 0.1 01/25/2015 1005   EOSABS 0.1 11/14/2014 0953   BASOSABS 0.0 01/25/2015 1005   BASOSABS 0.0 11/14/2014 0953   Comprehensive Metabolic Panel:    Component Value Date/Time   NA 134* 01/25/2015 1005   NA 139 11/14/2014 0953   K 3.3* 01/25/2015 1005   K 4.3 11/14/2014 0953   CL 92* 01/25/2015 1005   CL 104 11/07/2012 0839   CO2 35* 01/25/2015 1005   CO2 32* 11/14/2014 0953   BUN 7 01/25/2015 1005   BUN 11.5 11/14/2014 0953   CREATININE 0.53 01/25/2015 1005   CREATININE 0.6 11/14/2014 0953   GLUCOSE 102* 01/25/2015 1005   GLUCOSE 88 11/14/2014 0953   GLUCOSE 92 11/07/2012 0839   CALCIUM 8.7* 01/25/2015 1005   CALCIUM 9.2 11/14/2014 0953   AST 21 11/14/2014 0953   AST 28 06/26/2014 0535   ALT 17 11/14/2014 0953   ALT 38* 06/26/2014 0535   ALKPHOS 98 11/14/2014 0953   ALKPHOS 79 06/26/2014 0535   BILITOT 0.29 11/14/2014 0953   BILITOT 0.3 06/26/2014 0535   PROT 6.7 11/14/2014 0953   PROT 5.5* 06/26/2014 0535   ALBUMIN 3.9 11/14/2014 0953   ALBUMIN 3.1* 06/26/2014 0535    Physical Exam: Vital Signs: BP 161/70 mmHg  Pulse 95  Temp(Src) 97.7 F (36.5 C) (Oral)  Resp 23  Ht '5\' 6"'$  (1.676 m)  Wt 44.453 kg (98 lb)  BMI 15.83 kg/m2  SpO2 93% SpO2: SpO2: 93 % O2 Device: O2 Device: Nasal Cannula O2 Flow Rate: O2 Flow Rate (L/min): 4 L/min Intake/output summary:  Intake/Output Summary (Last 24 hours) at 01/25/15 1831 Last data filed at 01/25/15 1025  Gross per 24 hour  Intake    300 ml  Output      0 ml  Net    300 ml   LBM:   Baseline Weight: Weight: 44.453 kg (98 lb) Most recent weight: Weight: 44.453 kg (98 lb)  Exam Findings:  General: Cachectic middle-aged female acutely ill. Very short of breath at rest Respiratory: Airway tight audible wheezing, minimal air movement. She has a moist congested cough Cardiac: Heart rate is regular rate 80 Skin: Her knees are mottled, feet are cold         Palliative Performance Scale: 30% to               Additional Data Reviewed: Recent Labs     01/25/15  1005  WBC  7.1  HGB  13.2  PLT  165  NA  134*  BUN  7  CREATININE  0.53     Time In: 1700 Time Out: 1830 Time Total: 90 minutes Spoke to daughter and shared treatment plan but also my fear that she may only have hours to days to live given current clinical condition  Signed by: Dory Horn, NP  Dory Horn, NP  01/25/2015, 6:31 PM  Please contact Palliative Medicine Team phone at [phone] for questions and concerns.

## 2015-01-25 NOTE — ED Provider Notes (Signed)
CSN: [id number]     Arrival date & time 01/25/15  0913 History   First MD Initiated Contact with Patient 01/25/15 [phone]     Chief Complaint  Patient presents with  . Tachycardia      HPI  64 year old female significant history of severe COPD, right lung non-small cell cancer status post chemotherapy and radiation therapy, previous MI, previous CABG, coronary artery disease. Presents with worsening dyspnea at home this morning.  She awakened and felt more short of breath than she usually does. He states at times she will feel chest pain but on arrival here. Noted to have tachycardic rhythm per paramedics.  Description the monitor showed a heart rate of 225. Rhythm strip showing narrow complex ventricular rhythm with rates as high as 180. Was not medicated. At times her rate would be as low as 100 and sinus. Was intermittently in and out of these tachycardic rhythms.  Denies fever. She denies nausea vomiting or diarrhea. She has some chest pain but this is not unusual for her. Has had a cough but not anything more unusual for her. Does feel dyspneic proportion to her baseline. Is on 4 L home oxygen.  Is DO NOT RESUSCITATE. Does have home hospice initiated.  Past Medical History  Diagnosis Date  . Acute myocardial infarction, unspecified site, episode of care unspecified 2011    VF arrest. Ruled in, cath with no clear culprit lesion, inferior WMA on LV gram, medical therapy  . Esophageal dysmotility   . Hemorrhoids   . Esophageal stricture   . Diaphragmatic hernia without mention of obstruction or gangrene   . 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 CAD (coronary artery disease)     CABG'96., Xience DES -VG-RCA (01/12/13)  . Peripheral neuropathy   . Hyperlipidemia   . Peptic ulcer, unspecified site, unspecified as acute or chronic, without mention of hemorrhage, perforation, or obstruction   . Depressive disorder, not elsewhere classified   . Anxiety state, unspecified    . COPD (chronic obstructive pulmonary disease)   . GERD (gastroesophageal reflux disease)   . Candida esophagitis     on multiple occasions  . C. difficile diarrhea   . PVD (peripheral vascular disease)   . Headache(784.0)   . Weight loss 06/13/2011    scheduled here by family doctor  . CHF (congestive heart failure) 2011  . Asthma   . Hypertension   . History of nuclear stress test 07/14/2010    dipyriadmole; normal pattern of perfusion, low risk   . History of tobacco use     quit 05/2009  . Non-small cell carcinoma of lung, stage 3     Dr. Roxan Hockey  . History of radiation therapy 02/28/2012-04/14/2012    right lung  . Bilateral carotid artery disease   . TIA (transient ischemic attack)     amaurosis fugax  . Tubular adenoma of colon   . On home oxygen therapy     "4L; 24/7" (01/08/2015)  . Atrial fibrillation    Past Surgical History  Procedure Laterality Date  . Tonsillectomy  1974  . Mandible fracture surgery Left 1976  . Breast surgery Bilateral 1969, 1970    "tumors"  . Epigastric hernia repair  2008    Dr Roxan Hockey  . Coronary artery bypass graft  02/01/1994    left internal mammary to LAD, vein graft to diagonal/post descending, post lateral, marginals, circumfle, diagonal  . Carpal tunnel release    . Laminectomy    .  Subclavian vein angioplasty / stenting Left   . Transthoracic echocardiogram  08/14/2009    EF=>55%, normal LV systolic function, mild hypokinesis of basal segments of inferolateral & anterolater walls; mild MR/TR; AV mildly sclerotic  . Coronary angioplasty with stent placement  08/29/2000    stent to RCA (Dr. Corky Downs)  . Cardiac catheterization  01/17/2002    patent grafts (Dr. Marella Chimes)  . Cardiac catheterization  02/13/2004    significant L main & 3 vessel CAD, patents grafts, mild distal aortic disease (Dr. Gerrie Nordmann)  . Cardiac catheterization  07/14/2005    patent grafts, normal LV function (Dr. Adora Fridge)  . Cardiac catheterization   09/26/2008    patent grafts, normal LV function (Dr. Adora Fridge)  . Cardiac catheterization  05/31/2009    patent grafts, inferior wall motion abnormality (Dr. Adora Fridge)  . Cardiac catheterization  10/03/2009    normal LV function; 100% native RCA, circumflex, distal LAD occlusion  . Coronary angioplasty with stent placement  01/12/2013    Xience DES to SVG-RCA distal  . Carotid doppler  08/14/2009    R & L ICAs - 0-49% diameter reduction   . Left heart catheterization with coronary/graft angiogram N/A 01/12/2013    Procedure: LEFT HEART CATHETERIZATION WITH Beatrix Fetters;  Surgeon: Leonie Man, MD;  Location: [facility] CATH LAB;  Service: Cardiovascular;  Laterality: N/A;  . Vaginal hysterectomy  1998   Family History  Problem Relation Age of Onset  . Lung cancer Father     also CVA  . Heart disease Father   . Asthma Father   . Allergies Father   . Cancer Father   . Heart disease Mother     also CVA, MI  . Brain cancer Mother   . Heart disease Sister   . Colon cancer Neg Hx   . Lung cancer Brother 18    also ENT cancer  . Hypertension Brother 41    also aneurysm, early CAD  . Breast cancer Other     family hx   Social History  Substance Use Topics  . Smoking status: Current Every Day Smoker -- 1.50 packs/day for 30 years    Types: Cigarettes  . Smokeless tobacco: Never Used  . Alcohol Use: No   OB History    No data available     Review of Systems  Constitutional: Negative for fever, chills, diaphoresis, appetite change and fatigue.  HENT: Negative for mouth sores, sore throat and trouble swallowing.   Eyes: Negative for visual disturbance.  Respiratory: Positive for chest tightness and shortness of breath. Negative for cough and wheezing.   Cardiovascular: Positive for chest pain and palpitations.  Gastrointestinal: Negative for nausea, vomiting, abdominal pain, diarrhea and abdominal distention.  Endocrine: Negative for polydipsia, polyphagia and polyuria.   Genitourinary: Negative for dysuria, frequency and hematuria.  Musculoskeletal: Negative for gait problem.  Skin: Negative for color change, pallor and rash.  Neurological: Negative for dizziness, syncope, light-headedness and headaches.  Hematological: Does not bruise/bleed easily.  Psychiatric/Behavioral: Negative for behavioral problems and confusion.      Allergies  Amiodarone; Clopidogrel bisulfate; Dilaudid; and Multaq  Home Medications   Prior to Admission medications   Medication Sig Start Date End Date Taking? Authorizing Provider  albuterol (PROAIR HFA) 108 (90 BASE) MCG/ACT inhaler Inhale 1-2 puffs into the lungs every 6 (six) hours as needed for wheezing or shortness of breath. 01/10/15   Kinnie Feil, MD  albuterol (PROVENTIL) (2.5 MG/3ML) 0.083% nebulizer  solution Take 3 mLs (2.5 mg total) by nebulization every 6 (six) hours as needed for wheezing or shortness of breath. 01/10/15   Kinnie Feil, MD  aspirin EC 81 MG tablet Take 81 mg by mouth daily.    Historical Provider, MD  atorvastatin (LIPITOR) 80 MG tablet Take 80 mg by mouth at bedtime.    Historical Provider, MD  diazepam (VALIUM) 5 MG tablet Take 5 mg by mouth 2 (two) times daily as needed for anxiety.  10/31/14   Historical Provider, MD  escitalopram (LEXAPRO) 10 MG tablet Take 1 tablet (10 mg total) by mouth daily. Patient not taking: Reported on 01/08/2015 10/17/14   Lorretta Harp, MD  famotidine (PEPCID) 20 MG tablet Take 1 tablet (20 mg total) by mouth at bedtime. 12/11/14   Lafayette Dragon, MD  fluconazole (DIFLUCAN) 100 MG tablet Take 1 tablet (100 mg total) by mouth daily. 01/10/15   Kinnie Feil, MD  Fluticasone Furoate-Vilanterol (BREO ELLIPTA) 100-25 MCG/INH AEPB Inhale 1 puff into the lungs 2 (two) times daily.    Historical Provider, MD  HYDROcodone-acetaminophen (NORCO) 10-325 MG per tablet Take 1-2 tablets by mouth every 6 (six) hours as needed for moderate pain.    Historical Provider, MD   levofloxacin (LEVAQUIN) 500 MG tablet Take 1 tablet (500 mg total) by mouth daily. 01/10/15   Kinnie Feil, MD  loratadine (CLARITIN) 10 MG tablet Take 1 tablet (10 mg total) by mouth daily as needed for allergies. 03/05/14   Ripudeep Krystal Eaton, MD  nitroGLYCERIN (NITROSTAT) 0.4 MG SL tablet Place 0.4 mg under the tongue every 5 (five) minutes as needed for chest pain.     Historical Provider, MD  nortriptyline (PAMELOR) 25 MG capsule Take 1 capsule (25 mg total) by mouth at bedtime. Patient not taking: Reported on 01/08/2015 09/24/14   Britt Bottom, MD  ondansetron (ZOFRAN) 4 MG tablet Take 1 tablet (4 mg total) by mouth every 8 (eight) hours as needed for nausea or vomiting. 09/07/14   Tori Milks, MD  OXYGEN-HELIUM IN Inhale 4 L into the lungs continuous.    Historical Provider, MD  pantoprazole (PROTONIX) 40 MG tablet Take 1 tablet (40 mg total) by mouth 2 (two) times daily. 01/10/15   Kinnie Feil, MD  predniSONE (DELTASONE) 20 MG tablet Take 1 tablet (20 mg total) by mouth daily with breakfast. 01/10/15   Kinnie Feil, MD  SPIRIVA HANDIHALER 18 MCG inhalation capsule PLACE 1 CAPSULE (18 MCG TOTAL) INTO INHALER AND INHALE DAILY. 10/28/14   Noralee Space, MD  SYMBICORT 160-4.5 MCG/ACT inhaler INHALE 2 PUFFS INTO THE LUNGS TWICE A DAY THEN RINSE AFTER DOSING 08/23/14   Chesley Mires, MD  ZETIA 10 MG tablet TAKE 1 TABLET BY MOUTH EVERY DAY 03/25/14   Lorretta Harp, MD   BP 139/80 mmHg  Pulse 79  Temp(Src) 98 F (36.7 C) (Oral)  Resp 14  Ht '5\' 6"'$  (1.676 m)  Wt 93 lb 8 oz (42.411 kg)  BMI 15.10 kg/m2  SpO2 97% Physical Exam  Constitutional: She is oriented to person, place, and time.  Thin frail emaciated-appearing 64 year old female. Speaking in full sentences.  HENT:  Head: Normocephalic.  Eyes: Conjunctivae are normal. Pupils are equal, round, and reactive to light. No scleral icterus.  Neck: Normal range of motion. Neck supple. No thyromegaly present.  Cardiovascular: Normal rate  and regular rhythm.  Exam reveals no gallop and no friction rub.   No murmur heard.  Tachycardic irregular rhythm. Monitor shows H fibrillation with rapid ventricular response. Rate 158.  Pulmonary/Chest: Effort normal and breath sounds normal. No respiratory distress. She has no wheezes. She has no rales.  Barrel chest. Muscle wasting. Globally diminished and distant breath sounds. No wheezing or prolongation.  Abdominal: Soft. Bowel sounds are normal. She exhibits no distension. There is no tenderness. There is no rebound.  Musculoskeletal: Normal range of motion.  Neurological: She is alert and oriented to person, place, and time.  Skin: Skin is warm and dry. No rash noted.  Psychiatric: She has a normal mood and affect. Her behavior is normal.    ED Course  Procedures (including critical care time) Labs Review Labs Reviewed  CBC WITH DIFFERENTIAL/PLATELET - Abnormal; Notable for the following:    Neutrophils Relative % 81 (*)    Lymphocytes Relative 9 (*)    Lymphs Abs 0.6 (*)    All other components within normal limits  BASIC METABOLIC PANEL - Abnormal; Notable for the following:    Sodium 134 (*)    Potassium 3.3 (*)    Chloride 92 (*)    CO2 35 (*)    Glucose, Bld 102 (*)    Calcium 8.7 (*)    All other components within normal limits  BRAIN NATRIURETIC PEPTIDE - Abnormal; Notable for the following:    B Natriuretic Peptide 634.8 (*)    All other components within normal limits  PROTIME-INR  TROPONIN I  MAGNESIUM  PHOSPHORUS  TSH  I-STAT CG4 LACTIC ACID, ED    Imaging Review Ct Angio Chest Pe W/cm &/or Wo Cm  01/25/2015   CLINICAL DATA:  Shortness of breath.  COPD.  EXAM: CT ANGIOGRAPHY CHEST WITH CONTRAST  TECHNIQUE: Multidetector CT imaging of the chest was performed using the standard protocol during bolus administration of intravenous contrast. Multiplanar CT image reconstructions and MIPs were obtained to evaluate the vascular anatomy.  CONTRAST:  60m OMNIPAQUE  IOHEXOL 350 MG/ML SOLN  COMPARISON:  10/05/2013, 06/19/2014, 08/04/2012  FINDINGS: There is adequate opacification of the pulmonary arteries. There is no pulmonary embolus. The main pulmonary artery, right main pulmonary artery and left main pulmonary arteries are normal in size. The heart size is normal. There is no pericardial effusion. Normal caliber thoracic aorta. Scratched There is evidence of prior CABG. There is a patent left subclavian artery stents.  There is bilateral centrilobular emphysema. Stable spiculated nodule in the right upper lobe which is unchanged compared with the prior exam measuring 10 x 8 mm. Extending from the spiculated nodule is a subpleural soft tissue density along the right major fissure measuring 18 x 12 mm, unchanged compared with the prior exam of 10/05/2013. There is mild left basilar scarring. There is no focal consolidation, pleural effusion or pneumothorax.  There is esophageal wall thickening which may reflect esophagitis.  There is no axillary, hilar, or mediastinal adenopathy.  There is no lytic or blastic osseous lesion. Partially visualized is posterior thoracolumbar fusion. There is a chronic T10 vertebral body compression fracture.  The visualized portions of the upper abdomen are unremarkable.  Review of the MIP images confirms the above findings.  IMPRESSION: 1. No evidence of pulmonary embolus. 2. Esophageal wall thickening as can be seen with esophagitis. 3. Stable spiculated nodule and subpleural soft tissue density along the right major fissure.   Electronically Signed   By: HKathreen Devoid  On: 01/25/2015 11:39   Dg Chest Port 1 View  01/25/2015   CLINICAL DATA:  64 year old female with shortness of breath. Irregular heart rate. Supraventricular tachycardia. COPD on oxygen.  EXAM: PORTABLE CHEST - 1 VIEW  COMPARISON:  Multiple priors, most recently chest x-ray 01/09/2015.  FINDINGS: Lungs appear hyperexpanded with mild emphysematous changes. Chronic scarring in  the medial aspect of the right upper lobe contributing to opacity in the right paratracheal region, with chronic elevation of the right hemidiaphragm, with chronic elevation of the minor fissure, similar prior examinations. Left lung appears clear. Lung bases are not incompletely visualized. No large pleural effusions (small pleural effusions are not excluded secondary to incomplete visualization of the lower thorax). Mild diffuse peribronchial cuffing, similar to prior examinations (chronic). No evidence of pulmonary edema. Heart size is upper limits of normal, likely accentuated by portable AP technique and lordotic positioning. Atherosclerosis in the thoracic aorta. Status post median sternotomy for CABG.  IMPRESSION: 1. Chronic changes in the lungs, similar prior studies, as above. No definite radiographic evidence of acute cardiopulmonary disease. 2. Mild diffuse peribronchial cuffing and emphysematous changes; imaging findings compatible with the reported clinical history of underlying COPD. 3. Atherosclerosis.   Electronically Signed   By: Vinnie Langton M.D.   On: 01/25/2015 09:58   I have personally reviewed and evaluated these images and lab results as part of my medical decision-making.   EKG Interpretation   Date/Time:  Saturday January 25 2015 09:20:54 EDT Ventricular Rate:  148 PR Interval:    QRS Duration: 80 QT Interval:  300 QTC Calculation: 471 R Axis:   73 Text Interpretation:  Atrial flutter 2:1 AV conduction Confirmed by Jeneen Rinks   MD, [place] ([id number]) on 01/25/2015 9:51:30 AM      MDM   Final diagnoses:  Tachycardia  COPD exacerbation    CRITICAL CARE Performed by: Tanna Furry JOSEPH   Total critical care time: 60 minutes. Bolus and infusion of Cardizem, multiple albuterol treatments, multiple consultations.  Critical care time was exclusive of separately billable procedures and treating other patients.  Critical care was necessary to treat or prevent imminent or  life-threatening deterioration.  Critical care was time spent personally by me on the following activities: development of treatment plan with patient and/or surrogate as well as nursing, discussions with consultants, evaluation of patient's response to treatment, examination of patient, obtaining history from patient or surrogate, ordering and performing treatments and interventions, ordering and review of laboratory studies, ordering and review of radiographic studies, pulse oximetry and re-evaluation of patient's condition. Care   EKG shows atrial fibrillation/flutter heart rate 148. Still has rhythm changes frequently between sinus rhythm with controlled rate, and fib flutter with rapid ventricular response. She lists an allergy to amiodarone. With her COPD and felt a block would be a poor choice. With consideration for her known myopathy an EF of 45 was given a one-time dose of Cardizem 10 mg and placed on drip. This has had good response. Her pressures are maintained. Heart rate is now 110 and sinus. Chest x-ray shows no significant changes. With her worsening dyspnea and new tachycardia concern is for PE. High risk for this with her ongoing malignancy. She has renal function appropriate and is undergoing CT angiogram rule out PE.   I discussed the case with Dr. Kirk Ruths cardiology. Asked to see the patient in consultation regarding recommendations for medications for rate control.  12:30:  Patient complaining of dyspnea. Heart rate is controlled. Bony exam shows that she is now actually wheezing and somewhat prolonged and feels dyspneic. Given albuterol neb. Has been given  solu Medrol by paramedics.  13:10:  Patient getting some relief of dyspnea after treatment. Remains on low-dose Cardizem drip. Cardiology consultation ongoing. Will likely recommend by mouth Cardizem for rate control. Remains on Cardizem drip. Will speak with hospitalist regarding observation admission.      Tanna Furry, MD 01/26/15 [phone]

## 2015-01-25 NOTE — H&P (Signed)
[facility] History and Physical                                                                                    [name], is a 64 y.o. female  MRN: [id number]   DOB - Nov 06, 1950  Admit Date - 01/25/2015  Outpatient Primary MD for the patient is Tivis Ringer, MD  Referring MD: Jeneen Rinks / ER  With History of -  Past Medical History  Diagnosis Date  . Acute myocardial infarction, unspecified site, episode of care unspecified 2011    VF arrest. Ruled in, cath with no clear culprit lesion, inferior WMA on LV gram, medical therapy  . Esophageal dysmotility   . Hemorrhoids   . Esophageal stricture   . Diaphragmatic hernia without mention of obstruction or gangrene   . 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 CAD (coronary artery disease)     CABG'96., Xience DES -VG-RCA (01/12/13)  . Peripheral neuropathy   . Hyperlipidemia   . Peptic ulcer, unspecified site, unspecified as acute or chronic, without mention of hemorrhage, perforation, or obstruction   . Depressive disorder, not elsewhere classified   . Anxiety state, unspecified   . COPD (chronic obstructive pulmonary disease)   . GERD (gastroesophageal reflux disease)   . Candida esophagitis     on multiple occasions  . C. difficile diarrhea   . PVD (peripheral vascular disease)   . Headache(784.0)   . Weight loss 06/13/2011    scheduled here by family doctor  . CHF (congestive heart failure) 2011  . Asthma   . Hypertension   . History of nuclear stress test 07/14/2010    dipyriadmole; normal pattern of perfusion, low risk   . History of tobacco use     quit 05/2009  . Non-small cell carcinoma of lung, stage 3     Dr. Roxan Hockey  . History of radiation therapy 02/28/2012-04/14/2012    right lung  . Bilateral carotid artery disease   . TIA (transient ischemic attack)     amaurosis fugax  . Tubular adenoma of colon   . On home oxygen therapy     "4L; 24/7" (01/08/2015)  . Atrial fibrillation       Past Surgical History  Procedure Laterality Date  . Tonsillectomy  1974  . Mandible fracture surgery Left 1976  . Breast surgery Bilateral 1969, 1970    "tumors"  . Epigastric hernia repair  2008    Dr Roxan Hockey  . Coronary artery bypass graft  02/01/1994    left internal mammary to LAD, vein graft to diagonal/post descending, post lateral, marginals, circumfle, diagonal  . Carpal tunnel release    . Laminectomy    . Subclavian vein angioplasty / stenting Left   . Transthoracic echocardiogram  08/14/2009    EF=>55%, normal LV systolic function, mild hypokinesis of basal segments of inferolateral & anterolater walls; mild MR/TR; AV mildly sclerotic  . Coronary angioplasty with stent placement  08/29/2000    stent to RCA (Dr. Corky Downs)  . Cardiac catheterization  01/17/2002    patent grafts (Dr. Marella Chimes)  . Cardiac catheterization  02/13/2004    significant L  main & 3 vessel CAD, patents grafts, mild distal aortic disease (Dr. Gerrie Nordmann)  . Cardiac catheterization  07/14/2005    patent grafts, normal LV function (Dr. Adora Fridge)  . Cardiac catheterization  09/26/2008    patent grafts, normal LV function (Dr. Adora Fridge)  . Cardiac catheterization  05/31/2009    patent grafts, inferior wall motion abnormality (Dr. Adora Fridge)  . Cardiac catheterization  10/03/2009    normal LV function; 100% native RCA, circumflex, distal LAD occlusion  . Coronary angioplasty with stent placement  01/12/2013    Xience DES to SVG-RCA distal  . Carotid doppler  08/14/2009    R & L ICAs - 0-49% diameter reduction   . Left heart catheterization with coronary/graft angiogram N/A 01/12/2013    Procedure: LEFT HEART CATHETERIZATION WITH Beatrix Fetters;  Surgeon: Leonie Man, MD;  Location: [facility] CATH LAB;  Service: Cardiovascular;  Laterality: N/A;  . Vaginal hysterectomy  1998    in for   Chief Complaint  Patient presents with  . Tachycardia     HPI This is a 64 year old female patient with  history of ischemic cardiomyopathy with most recent EF 45%, known CAD and CABG status post drug-eluting stent to vein graft to RCA, known stage III non-small cell lung cancer diagnosed in 2013 status post chemoradiation, end-stage COPD oxygen dependent, recurrent esophagitis in setting of known esophageal dysmotility and esophageal stricture and recurrent [name] ear is currently on Diflucan, continued tobacco abuse, ongoing depression and anxiety, dyslipidemia and severe protein calorie malnutrition. Patient was recently discharged on 8/19 after an admission for chest pain. Acute coronary syndrome was ruled out and cardiology recommended continuing current medical regimen. During that admission she had a associated COPD exacerbation and was treated with Solu-Medrol and Levaquin. On clinical exam she was also found to have acute oropharyngeal candidiasis and was started on 2 week course of Diflucan. She was last evaluated by her oncologist on 6/27 with most recent CT scan on 6/23 demonstrating stability of spiculated right upper lobe nodule without disease progression. Patient returns today with acute onset of shortness of breath early this morning. Initially she thought this was more related to anxiety and contacted her hospice nurse who instructed her to take a Valium. Her symptoms did not improve. She also was having some chest pressure. EMS was called and she was found to have a tachycardic rhythm and subsequently transferred reported to the hospital.  Upon arrival to ER rhythm strip demonstrated now complex ventricular rhythm with a rate of 180 bpm. She's had no other changes in her respiratory symptoms according to the ER doctor and per minute history. After arrival to the ER patient's heart rate did improve to 105 bpm, BP was 132/89, O2 saturations were high percent on 4 L oxygen. Chest x-ray revealed underlying COPD without any focal changes. CT angiogram of the chest mistreated no pulmonary embolism,  no focal infiltrates, no evidence of volume overload. Incidental finding of esophageal wall thickening as can be seen with esophagitis. She was found to have mild hyponatremia with a sodium of 134 and hypokalemia with potassium of 3.3, glucose was 102, BNP was slightly elevated at 634, troponin was 0.03, lactic acid was 0.83, CBC was normal except for mildly elevated neutrophils of 81%. Because her for increased heart rate patient was bolused with Cardizem and started on Cardizem infusion. She subsequently converted to sinus rhythm. While in the ER she began to have more dyspnea and increased work of breathing requiring  her to sit upright in the bed. she has been given several doses of morphine, she's been given oral as well as IV potassium, she has been given 1 nebulizer treatment with albuterol with minimal improvement in her symptoms.  Upon my evaluation of the patient she was sitting upright in the bed with notable coarse expiratory wheezes on auscultation, she was complaining of difficulty breathing, she was subsequently given 125 mg of Solu-Medrol, Xopenex nebulizer was initiated. I returned 15 minutes later patient was sitting on the side of the bed but breathing much more easily. At this point more history was able to be obtained. Patient reports she continues to smoke "a little bit". Discussed with patient concerns her symptoms more likely related to end-stage COPD and introduced the concept of end-of-life care and beginning to transition our focus on comfort as opposed to aggressive care. Patient's daughter at bedside seemed to understand and process this concept better than the patient. Although patient is DNR, I'm not sure she is ready emotionally to accept that end-of-life is drawing closer. In addition to above, patient reports as having same chronic productive cough with yellow sputum but no fevers, chills, nausea vomiting or diarrhea. She continues to lose weight.   Review of Systems   In  addition to the HPI above,  No Fever-chills, myalgias or other constitutional symptoms No Headache, changes with Vision or hearing, new weakness, tingling, numbness in any extremity, No problems swallowing food or Liquids, indigestion/reflux  No Abdominal pain, N/V; no melena or hematochezia, no dark tarry stools, Bowel movements are regular, No dysuria, hematuria or flank pain No new skin rashes, lesions, masses or bruises, No new joints pains-aches No polyuria, polydypsia or polyphagia,  *A full 10 point Review of Systems was done, except as stated above, all other Review of Systems were negative.  Social History Social History  Substance Use Topics  . Smoking status: Current Every Day Smoker -- 1.50 packs/day for 30 years-patient reports does not smoke as frequently at this point     Types: Cigarettes  . Smokeless tobacco: Never Used  . Alcohol Use: No    Resides at: Private residence  Lives with: Alone  Ambulatory status: without assistive devices   Family History Family History  Problem Relation Age of Onset  . Lung cancer Father     also CVA  . Heart disease Father   . Asthma Father   . Allergies Father   . Cancer Father   . Heart disease Mother     also CVA, MI  . Brain cancer Mother   . Heart disease Sister   . Colon cancer Neg Hx   . Lung cancer Brother 26    also ENT cancer  . Hypertension Brother 40    also aneurysm, early CAD  . Breast cancer Other     family hx     Prior to Admission medications   Medication Sig Start Date End Date Taking? Authorizing Provider  albuterol (PROAIR HFA) 108 (90 BASE) MCG/ACT inhaler Inhale 1-2 puffs into the lungs every 6 (six) hours as needed for wheezing or shortness of breath. 01/10/15   Kinnie Feil, MD  albuterol (PROVENTIL) (2.5 MG/3ML) 0.083% nebulizer solution Take 3 mLs (2.5 mg total) by nebulization every 6 (six) hours as needed for wheezing or shortness of breath. 01/10/15   Kinnie Feil, MD  aspirin  EC 81 MG tablet Take 81 mg by mouth daily.    Historical Provider, MD  atorvastatin (LIPITOR) 80  MG tablet Take 80 mg by mouth at bedtime.    Historical Provider, MD  diazepam (VALIUM) 5 MG tablet Take 5 mg by mouth 2 (two) times daily as needed for anxiety.  10/31/14   Historical Provider, MD  escitalopram (LEXAPRO) 10 MG tablet Take 1 tablet (10 mg total) by mouth daily. Patient not taking: Reported on 01/08/2015 10/17/14   Lorretta Harp, MD  famotidine (PEPCID) 20 MG tablet Take 1 tablet (20 mg total) by mouth at bedtime. 12/11/14   Lafayette Dragon, MD  fluconazole (DIFLUCAN) 100 MG tablet Take 1 tablet (100 mg total) by mouth daily. 01/10/15   Kinnie Feil, MD  Fluticasone Furoate-Vilanterol (BREO ELLIPTA) 100-25 MCG/INH AEPB Inhale 1 puff into the lungs 2 (two) times daily.    Historical Provider, MD  HYDROcodone-acetaminophen (NORCO) 10-325 MG per tablet Take 1-2 tablets by mouth every 6 (six) hours as needed for moderate pain.    Historical Provider, MD  levofloxacin (LEVAQUIN) 500 MG tablet Take 1 tablet (500 mg total) by mouth daily. 01/10/15   Kinnie Feil, MD  loratadine (CLARITIN) 10 MG tablet Take 1 tablet (10 mg total) by mouth daily as needed for allergies. 03/05/14   Ripudeep Krystal Eaton, MD  nitroGLYCERIN (NITROSTAT) 0.4 MG SL tablet Place 0.4 mg under the tongue every 5 (five) minutes as needed for chest pain.     Historical Provider, MD  nortriptyline (PAMELOR) 25 MG capsule Take 1 capsule (25 mg total) by mouth at bedtime. Patient not taking: Reported on 01/08/2015 09/24/14   Britt Bottom, MD  ondansetron (ZOFRAN) 4 MG tablet Take 1 tablet (4 mg total) by mouth every 8 (eight) hours as needed for nausea or vomiting. 09/07/14   Tori Milks, MD  OXYGEN-HELIUM IN Inhale 4 L into the lungs continuous.    Historical Provider, MD  pantoprazole (PROTONIX) 40 MG tablet Take 1 tablet (40 mg total) by mouth 2 (two) times daily. 01/10/15   Kinnie Feil, MD  predniSONE (DELTASONE) 20 MG  tablet Take 1 tablet (20 mg total) by mouth daily with breakfast. 01/10/15   Kinnie Feil, MD  SPIRIVA HANDIHALER 18 MCG inhalation capsule PLACE 1 CAPSULE (18 MCG TOTAL) INTO INHALER AND INHALE DAILY. 10/28/14   Noralee Space, MD  SYMBICORT 160-4.5 MCG/ACT inhaler INHALE 2 PUFFS INTO THE LUNGS TWICE A DAY THEN RINSE AFTER DOSING 08/23/14   Chesley Mires, MD  ZETIA 10 MG tablet TAKE 1 TABLET BY MOUTH EVERY DAY 03/25/14   Lorretta Harp, MD    Allergies  Allergen Reactions  . Amiodarone Other (See Comments)    Drops HR too low, SOB, fatigue  . Clopidogrel Bisulfate Hives  . Dilaudid [Hydromorphone Hcl] Other (See Comments)    "I couldn't function for 2 days."  . Multaq [Dronedarone] Other (See Comments)    Heart races     Physical Exam  Vitals  Blood pressure 145/80, pulse 139, resp. rate 25, height '5\' 6"'$  (1.676 m), weight 98 lb (44.453 kg), SpO2 93 %.   General:  Initially was in moderate respiratory distress as evidenced by increased work of breathing and audible wheezing with associated anxiety; appears very underweight and cachectic  Psych: Anxious affect, Denies Suicidal or Homicidal ideations, Awake Alert, Oriented X 3. Speech and thought patterns are clear and appropriate, no apparent short term memory deficits  Neuro:   No focal neurological deficits, CN II through XII intact, Strength 5/5 all 4 extremities, Sensation intact all 4 extremities.  ENT:  Ears and Eyes appear Normal, Conjunctivae clear, PER. Moist oral mucosa without erythema or exudates.  Neck:  Supple, No lymphadenopathy appreciated  Respiratory:  Symmetrical chest wall movement, upper airways with adequate movement decreased in bases, coarse expiratory wheezes, increased work of breathing that improved after medical therapies and patient repositioning to upright position on side of bed, or liters oxygen  Cardiac: Irregular-sinus rhythm with frequent PACs, No Murmurs, no LE edema noted, no JVD, No carotid  bruits, peripheral pulses palpable at 2+  Abdomen:  Positive bowel sounds, Soft, Non tender, Non distended,  No masses appreciated, no obvious hepatosplenomegaly  Skin:  No Cyanosis, poor Skin Turgor secondary to significant weight loss, No Skin Rash   Extremities: Symmetrical without obvious trauma or injury,  no effusions.  Data Review  CBC  Recent Labs Lab 01/25/15 1005  WBC 7.1  HGB 13.2  HCT 39.6  PLT 165  MCV 94.1  MCH 31.4  MCHC 33.3  RDW 12.9  LYMPHSABS 0.6*  MONOABS 0.6  EOSABS 0.1  BASOSABS 0.0    Chemistries   Recent Labs Lab 01/25/15 1005  NA 134*  K 3.3*  CL 92*  CO2 35*  GLUCOSE 102*  BUN 7  CREATININE 0.53  CALCIUM 8.7*    estimated creatinine clearance is 51.2 mL/min (by C-G formula based on Cr of 0.53).  No results for input(s): TSH, T4TOTAL, T3FREE, THYROIDAB in the last 72 hours.  Invalid input(s): FREET3  Coagulation profile  Recent Labs Lab 01/25/15 1005  INR 1.12    No results for input(s): DDIMER in the last 72 hours.  Cardiac Enzymes  Recent Labs Lab 01/25/15 1005  TROPONINI 0.03    Invalid input(s): POCBNP  Urinalysis    Component Value Date/Time   COLORURINE YELLOW 01/08/2015 1003   APPEARANCEUR CLEAR 01/08/2015 1003   LABSPEC 1.004* 01/08/2015 1003   PHURINE 7.0 01/08/2015 1003   GLUCOSEU NEGATIVE 01/08/2015 1003   HGBUR TRACE* 01/08/2015 1003   BILIRUBINUR NEGATIVE 01/08/2015 1003   KETONESUR NEGATIVE 01/08/2015 1003   PROTEINUR NEGATIVE 01/08/2015 1003   UROBILINOGEN 0.2 01/08/2015 1003   NITRITE NEGATIVE 01/08/2015 1003   LEUKOCYTESUR NEGATIVE 01/08/2015 1003    Imaging results:   Dg Chest 2 View  01/09/2015   CLINICAL DATA:  Shortness of breath, COPD coronary artery disease.  EXAM: CHEST  2 VIEW  COMPARISON:  PA and lateral chest x-ray of January 08, 2015  FINDINGS: The lungs remain hyperinflated and clear. The heart is normal in size. The pulmonary vascularity is not engorged. There is no  significant pleural effusion and there is no pneumothorax. There is 7 intact sternal wires. There are posterior spinal fusion device is present in the lower thoracic and lumbar spine.  IMPRESSION: COPD. There is no CHF, pneumonia, nor other acute cardiopulmonary abnormality.   Electronically Signed   By: David  Martinique M.D.   On: 01/09/2015 07:31   Dg Chest 2 View  01/08/2015   CLINICAL DATA:  Shortness of breath.  Weakness today.  EXAM: CHEST  2 VIEW  COMPARISON:  09/27/2014.  Chest CT 11/14/2014  FINDINGS: There is hyperinflation of the lungs compatible with COPD. Heart and mediastinal contours are within normal limits. No focal opacities or effusions. No acute bony abnormality.  IMPRESSION: COPD.  No active disease.   Electronically Signed   By: Rolm Baptise M.D.   On: 01/08/2015 10:35   Ct Angio Chest Pe W/cm &/or Wo Cm  01/25/2015   CLINICAL DATA:  Shortness of breath.  COPD.  EXAM: CT ANGIOGRAPHY CHEST WITH CONTRAST  TECHNIQUE: Multidetector CT imaging of the chest was performed using the standard protocol during bolus administration of intravenous contrast. Multiplanar CT image reconstructions and MIPs were obtained to evaluate the vascular anatomy.  CONTRAST:  32m OMNIPAQUE IOHEXOL 350 MG/ML SOLN  COMPARISON:  10/05/2013, 06/19/2014, 08/04/2012  FINDINGS: There is adequate opacification of the pulmonary arteries. There is no pulmonary embolus. The main pulmonary artery, right main pulmonary artery and left main pulmonary arteries are normal in size. The heart size is normal. There is no pericardial effusion. Normal caliber thoracic aorta. Scratched There is evidence of prior CABG. There is a patent left subclavian artery stents.  There is bilateral centrilobular emphysema. Stable spiculated nodule in the right upper lobe which is unchanged compared with the prior exam measuring 10 x 8 mm. Extending from the spiculated nodule is a subpleural soft tissue density along the right major fissure measuring 18  x 12 mm, unchanged compared with the prior exam of 10/05/2013. There is mild left basilar scarring. There is no focal consolidation, pleural effusion or pneumothorax.  There is esophageal wall thickening which may reflect esophagitis.  There is no axillary, hilar, or mediastinal adenopathy.  There is no lytic or blastic osseous lesion. Partially visualized is posterior thoracolumbar fusion. There is a chronic T10 vertebral body compression fracture.  The visualized portions of the upper abdomen are unremarkable.  Review of the MIP images confirms the above findings.  IMPRESSION: 1. No evidence of pulmonary embolus. 2. Esophageal wall thickening as can be seen with esophagitis. 3. Stable spiculated nodule and subpleural soft tissue density along the right major fissure.   Electronically Signed   By: HKathreen Devoid  On: 01/25/2015 11:39   Dg Chest Port 1 View  01/25/2015   CLINICAL DATA:  64year old female with shortness of breath. Irregular heart rate. Supraventricular tachycardia. COPD on oxygen.  EXAM: PORTABLE CHEST - 1 VIEW  COMPARISON:  Multiple priors, most recently chest x-ray 01/09/2015.  FINDINGS: Lungs appear hyperexpanded with mild emphysematous changes. Chronic scarring in the medial aspect of the right upper lobe contributing to opacity in the right paratracheal region, with chronic elevation of the right hemidiaphragm, with chronic elevation of the minor fissure, similar prior examinations. Left lung appears clear. Lung bases are not incompletely visualized. No large pleural effusions (small pleural effusions are not excluded secondary to incomplete visualization of the lower thorax). Mild diffuse peribronchial cuffing, similar to prior examinations (chronic). No evidence of pulmonary edema. Heart size is upper limits of normal, likely accentuated by portable AP technique and lordotic positioning. Atherosclerosis in the thoracic aorta. Status post median sternotomy for CABG.  IMPRESSION: 1. Chronic  changes in the lungs, similar prior studies, as above. No definite radiographic evidence of acute cardiopulmonary disease. 2. Mild diffuse peribronchial cuffing and emphysematous changes; imaging findings compatible with the reported clinical history of underlying COPD. 3. Atherosclerosis.   Electronically Signed   By: DVinnie LangtonM.D.   On: 01/25/2015 09:58     EKG: (Independently reviewed) . To be consistent with atrial flutter with 2-1 AV conduction but cardiology has since reviewed EKG and has determined this to be atrial tachycardia; ventricular rate is 148 bpm, QTC normal at 471 ms, no ST segment or T-wave changes that would be concerning for acute ischemia   Assessment & Plan  Principal Problem:   Acute and chronic respiratory failure with hypoxia:  a) End stage COPD  b)  COPD with acute exacerbation -Admit to stepdown (see below) -Suspect current respiratory issues and progressive decline more related to end-stage COPD as opposed to her underlying lung cancer -Patient DO NOT RESUSCITATE and on hospice but does not appear emotionally ready at this juncture to transition to full comfort measures -Hold home prednisone and continue IV Solu-Medrol -Since having underlying tachycardia we'll utilize Xopenex scheduled every 6 hours for bronchodilation -Add liquid guaifenesin as mucolytic -Continue home oxygen at 4 L/m -For severe episodes of dyspnea I have added when necessary BiPAP but would only use this as last resort -Patient continues to smoke and has been counseled numerous occasions; states nicotine patch ineffective -Suspect will need increased benzodiazepine dosing as COPD worsens therefore I have currently increased her preadmission Valium from twice daily as needed to 3 times daily she may eventually require 4 times daily dosing -No leukocytosis or change in cough and no evidence of infiltrates or other infectious causes on x-ray/CT scan  Active Problems:   Atrial  tachycardia -Cardiology assisting -Continue IV Cardizem for now-eventual transition to oral agents in a.m. 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 -Recent CT June 2016 demonstrated stability without any disease progression    Esophageal dysmotility/Esophageal stricture/Oropharyngeal candidiasis -Patient continues to complain of sore throat and some difficulty swallowing -Continue Diflucan -SLP evaluation for appropriate diet consistency -Given swallowing issues could be experiencing occult aspiration which is further exacerbating COPD symptoms    Hypertension -Blood pressure currently controlled    CAD (coronary artery disease), autologous vein bypass graft, cath 01/12/13 with Xience DES to VG-RCA  -Suspect recent chest pain related to tachycardia arrhythmia and COPD exacerbation -Continue medical therapy as tolerated    Hyperlipidemia -Continue sodium and Lipitor    Anxiety state/Depression -Valium as above -May benefit from chaplain consultation -Previously enrolled in hospice care prior to admission    Protein-calorie malnutrition, severe -Allow nonrestricted regular diet -Protein supplementation with either Resource beverage or Ensure: Patient preference    DVT Prophylaxis: Lovenox  Family Communication:   Daughter at bedside  Code Status:  DO NOT RESUSCITATE  Condition:  Stable  Discharge disposition: Anticipate discharge back to home once COPD exacerbation resolved and no further episodes of SVT/atrial tachycardia  Time spent in minutes : 60      ELLIS,ALLISON L. ANP on 01/25/2015 at 2:00 PM  Between 7am to 7pm - Pager - [phone]  After 7pm go to [email or web address] - password [email or web address]  And look for the night coverage person covering me after hours  [facility]  Addendum  I personally evaluated patient on 01/25/2015 and agree with the above findings. Mrs. Mercer  is an unfortunate 64 year old female with a past medical history of end-stage COPD and stage IIIA non-small cell lung cancer diagnosed in 2013. She has a history of undergoing chemoradiation therapy at the [facility] last dose was given in November 2013 with partial response. She presented to the emergency department today with complaints of shortness of breath worse from her baseline. She was found to be in SVT on presentation. In the emergency department she was seen and evaluated by cardiology started on IV Cardizem. She is currently under the care of hospice. Discussed overall goals of care with patient and family members as they expressed interest in transitioning to an inpatient hospice facility like [facility]. On exam she is cachectic, appears short of breath, uncomfortable. Will start her on  morphine sulfate 10 mg every 2 hours for shortness of breath and discomfort. Will also start her on IV Ativan 1 mg for anxiety. Palliative care consultation for symptomatic management.

## 2015-01-25 NOTE — ED Notes (Signed)
Pt family member at nurses station requesting that pt lung sounds be auscultated due to pt reports of wheezing and requesting breathing tx. This RN at bedside to assess.

## 2015-01-26 MED ORDER — DILTIAZEM 12 MG/ML ORAL SUSPENSION
60.0000 mg | Freq: Three times a day (TID) | ORAL | Status: DC
Start: 2015-01-26 — End: 2015-01-28
  Administered 2015-01-26 – 2015-01-28 (×6): 60 mg via ORAL
  Filled 2015-01-26 (×9): qty 6

## 2015-01-26 MED ORDER — HYDROMORPHONE HCL 1 MG/ML IJ SOLN
0.5000 mg | INTRAMUSCULAR | Status: DC
  Administered 2015-01-26 – 2015-01-27 (×5): 0.5 mg via INTRAVENOUS
  Filled 2015-01-26 (×5): qty 1

## 2015-01-26 MED ORDER — IBUPROFEN 200 MG PO TABS
400.0000 mg | ORAL_TABLET | Freq: Four times a day (QID) | ORAL | Status: DC | PRN
  Administered 2015-01-26 – 2015-01-27 (×3): 400 mg via ORAL
  Filled 2015-01-26 (×3): qty 2

## 2015-01-26 MED ORDER — HYDROCODONE-HOMATROPINE 5-1.5 MG/5ML PO SYRP
5.0000 mL | ORAL_SOLUTION | Freq: Four times a day (QID) | ORAL | Status: DC | PRN
  Administered 2015-01-26 – 2015-01-27 (×3): 5 mL via ORAL
  Filled 2015-01-26 (×3): qty 5

## 2015-01-26 MED ORDER — DOCUSATE SODIUM 100 MG PO CAPS
100.0000 mg | ORAL_CAPSULE | Freq: Two times a day (BID) | ORAL | Status: DC
  Administered 2015-01-26 – 2015-01-28 (×4): 100 mg via ORAL
  Filled 2015-01-26 (×5): qty 1

## 2015-01-26 MED ORDER — ENOXAPARIN SODIUM 30 MG/0.3ML ~~LOC~~ SOLN
30.0000 mg | SUBCUTANEOUS | Status: DC
  Administered 2015-01-26 – 2015-01-27 (×2): 30 mg via SUBCUTANEOUS
  Filled 2015-01-26 (×2): qty 0.3

## 2015-01-26 NOTE — Progress Notes (Signed)
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 Not yet stable for discharge. Cardizem changed to by mouth.  Code Status:  DNR Family Communication:  Multiple at bedside Disposition Plan:  Home with hospice 1 breathing improved.  Consultants:  Palliative care medicine  Cardiology, signed off  Procedures:     Antibiotics:    HPI/Subjective: Was very short of breath this morning. Slightly improved. Unable to recline in bed due to dyspnea.  Objective: Filed Vitals:   01/26/15 0410  BP: 139/80  Pulse: 79  Temp: 98 F (36.7 C)  Resp: 14    Intake/Output Summary (Last 24 hours) at 01/26/15 1516 Last data filed at 01/26/15 0500  Gross per 24 hour  Intake    480 ml  Output    900 ml  Net   -420 ml   Filed Weights   01/25/15 0923 01/26/15 0410  Weight: 44.453 kg (98 lb) 42.411 kg (93 lb 8 oz)    Exam:   General:  Cachectic. Seated at edge of bed. Breathing nonlabored. Alert and oriented.  Cardiovascular: Regular rate rhythm without murmurs gallops rubs  Respiratory: Diminished throughout without wheezes rhonchi or rales  Abdomen: Scaphoid, soft, nontender  Ext: Trace pedal edema  Basic Metabolic Panel:  Recent Labs Lab 01/25/15 1005 01/25/15 1517  NA 134*   --   K 3.3*  --   CL 92*  --   CO2 35*  --   GLUCOSE 102*  --   BUN 7  --   CREATININE 0.53  --   CALCIUM 8.7*  --   MG  --  1.7  PHOS  --  3.7   Liver Function Tests: No results for input(s): AST, ALT, ALKPHOS, BILITOT, PROT, ALBUMIN in the last 168 hours. No results for input(s): LIPASE, AMYLASE in the last 168 hours. No results for input(s): AMMONIA in the last 168 hours. CBC:  Recent Labs Lab 01/25/15 1005  WBC 7.1  NEUTROABS 5.8  HGB 13.2  HCT 39.6  MCV 94.1  PLT 165   Cardiac Enzymes:  Recent Labs Lab 01/25/15 1005  TROPONINI 0.03   BNP (last 3 results)  Recent Labs  09/27/14 1323 01/08/15 0918 01/25/15 1006  BNP 199.1* 261.2* 634.8*    ProBNP (last 3 results)  Recent Labs  03/03/14 1147  PROBNP 475.8*    CBG: No results for input(s): GLUCAP in the last 168 hours.  No results found for this or any previous visit (from the past 240 hour(s)).   Studies: Ct Angio Chest Pe W/cm &/or Wo Cm  01/25/2015   CLINICAL DATA:  Shortness of breath.  COPD.  EXAM: CT ANGIOGRAPHY CHEST WITH CONTRAST  TECHNIQUE: Multidetector CT imaging of the chest was performed using the standard protocol during bolus administration of intravenous contrast. Multiplanar CT image reconstructions and MIPs  were obtained to evaluate the vascular anatomy.  CONTRAST:  50m OMNIPAQUE IOHEXOL 350 MG/ML SOLN  COMPARISON:  10/05/2013, 06/19/2014, 08/04/2012  FINDINGS: There is adequate opacification of the pulmonary arteries. There is no pulmonary embolus. The main pulmonary artery, right main pulmonary artery and left main pulmonary arteries are normal in size. The heart size is normal. There is no pericardial effusion. Normal caliber thoracic aorta. Scratched There is evidence of prior CABG. There is a patent left subclavian artery stents.  There is bilateral centrilobular emphysema. Stable spiculated nodule in the right upper lobe which is unchanged compared with the prior exam measuring 10 x  8 mm. Extending from the spiculated nodule is a subpleural soft tissue density along the right major fissure measuring 18 x 12 mm, unchanged compared with the prior exam of 10/05/2013. There is mild left basilar scarring. There is no focal consolidation, pleural effusion or pneumothorax.  There is esophageal wall thickening which may reflect esophagitis.  There is no axillary, hilar, or mediastinal adenopathy.  There is no lytic or blastic osseous lesion. Partially visualized is posterior thoracolumbar fusion. There is a chronic T10 vertebral body compression fracture.  The visualized portions of the upper abdomen are unremarkable.  Review of the MIP images confirms the above findings.  IMPRESSION: 1. No evidence of pulmonary embolus. 2. Esophageal wall thickening as can be seen with esophagitis. 3. Stable spiculated nodule and subpleural soft tissue density along the right major fissure.   Electronically Signed   By: HKathreen Devoid  On: 01/25/2015 11:39   Dg Chest Port 1 View  01/25/2015   CLINICAL DATA:  64year old female with shortness of breath. Irregular heart rate. Supraventricular tachycardia. COPD on oxygen.  EXAM: PORTABLE CHEST - 1 VIEW  COMPARISON:  Multiple priors, most recently chest x-ray 01/09/2015.  FINDINGS: Lungs appear hyperexpanded with mild emphysematous changes. Chronic scarring in the medial aspect of the right upper lobe contributing to opacity in the right paratracheal region, with chronic elevation of the right hemidiaphragm, with chronic elevation of the minor fissure, similar prior examinations. Left lung appears clear. Lung bases are not incompletely visualized. No large pleural effusions (small pleural effusions are not excluded secondary to incomplete visualization of the lower thorax). Mild diffuse peribronchial cuffing, similar to prior examinations (chronic). No evidence of pulmonary edema. Heart size is upper limits of normal, likely accentuated by portable AP technique and  lordotic positioning. Atherosclerosis in the thoracic aorta. Status post median sternotomy for CABG.  IMPRESSION: 1. Chronic changes in the lungs, similar prior studies, as above. No definite radiographic evidence of acute cardiopulmonary disease. 2. Mild diffuse peribronchial cuffing and emphysematous changes; imaging findings compatible with the reported clinical history of underlying COPD. 3. Atherosclerosis.   Electronically Signed   By: DVinnie LangtonM.D.   On: 01/25/2015 09:58    Scheduled Meds: . aspirin EC  81 mg Oral Daily  . budesonide-formoterol  2 puff Inhalation BID  . diltiazem  60 mg Oral Q8H  . docusate sodium  100 mg Oral BID  . enoxaparin (LOVENOX) injection  30 mg Subcutaneous Q24H  . ezetimibe  10 mg Oral Daily  . famotidine  20 mg Oral QHS  . feeding supplement  1 Container Oral TID BM  . feeding supplement (ENSURE ENLIVE)  237 mL Oral BID BM  . fluconazole  100 mg Oral Daily  .  HYDROmorphone (DILAUDID) injection  0.5 mg Intravenous 6 times per day  . levalbuterol  0.63 mg Nebulization Q6H  .  methylPREDNISolone (SOLU-MEDROL) injection  125 mg Intravenous Once  . methylPREDNISolone (SOLU-MEDROL) injection  60 mg Intravenous Q6H  . pantoprazole  40 mg Oral BID  . sodium chloride  3 mL Intravenous Q12H  . thiamine  100 mg Oral Daily  . tiotropium  18 mcg Inhalation Daily   Continuous Infusions:   Time spent: 35 minutes  [facility] [email or web address], password [facility] 01/26/2015, 3:16 PM  LOS: 1 day

## 2015-01-26 NOTE — Evaluation (Signed)
Physical Therapy Evaluation Patient Details Name: [name] MRN: [id number] DOB: November 03, 1950 Today's Date: 01/26/2015   History of Present Illness  64 y.o. female admitted to [facility] on 01/25/15 with SOB and chest pressure.  Dx with acute on chronic respiratory failure with hypoxia, end stage COPD with acute exacerbation, atrial tachycardia, and esophageal dysmobility/esophageal stricture, oropharyngeal candidiasis.  Pt with PMHx of depression/anxiety, COPD, PVD, CHF, HTN, lung CA, TIA, A-fib, on home O2 (4 L O2 [place]), CABG, low back surgery, multiple cardiac caths with stents/angioplasties.    Clinical Impression  Pt is progressing better today with her mobility and was able to walk in the hallway today on 4 L O2 [place] with both HR and O2 sats controlled.  PT will follow acutely, but this is close to pt's baseline (I know her from previous admissions).  I agree with home hospice care at d/c.      Follow Up Recommendations No PT follow up    Equipment Recommendations  None recommended by PT    Recommendations for Other Services   NA     Precautions / Restrictions Precautions Precautions: Fall;Other (comment) Precaution Comments: pt generally weak and deconditioned.  Monitor O2 sats and HR      Mobility  Bed Mobility Overal bed mobility: Modified Independent             General bed mobility comments: HOB elevated, using railing for leverag, extra time needed to complete task.   Transfers Overall transfer level: Needs assistance Equipment used: Rolling walker (2 wheeled) Transfers: Sit to/from Stand Sit to Stand: Min guard         General transfer comment: Min guard assist for safety, uncontrolled descent to sit.  Verbal cues for safe hand placement.   Ambulation/Gait Ambulation/Gait assistance: Min guard Ambulation Distance (Feet): 300 Feet Assistive device: Rolling walker (2 wheeled) Gait Pattern/deviations: Step-through pattern;Shuffle;Staggering right;Staggering  left Gait velocity: decreased Gait velocity interpretation: Below normal speed for age/gender General Gait Details: Pt with slow, staggering gait pattern even with RW.  She takes her time, has to stop talking while walking, but O2 sats remained in the 90s during gait on 4 L O2 [place].  DOE 3/4, but pt able to continue.  HR stable throughout.  Even with RW, pt with mildly staggering gait pattern.          Balance Overall balance assessment: Needs assistance Sitting-balance support: Feet supported;Bilateral upper extremity supported Sitting balance-Leahy Scale: Fair     Standing balance support: Bilateral upper extremity supported Standing balance-Leahy Scale: Poor                               Pertinent Vitals/Pain Pain Assessment: 0-10 Pain Score: 7  Pain Location: back Pain Descriptors / Indicators: Constant Pain Intervention(s): Limited activity within patient's tolerance;Monitored during session;Repositioned;Patient requesting pain meds-RN notified    Home Living Family/patient expects to be discharged to:: Private residence Living Arrangements: Other relatives;Other (Comment) (15 y.o. grandson lives with her) Available Help at Discharge: Family;Available PRN/intermittently Type of Home: House Home Access: Ramped entrance     Home Layout: One level Home Equipment: Walker - 2 wheels;Walker - 4 wheels;Wheelchair - Education officer, community - power;Electric scooter (home O2- 4 L 24/7)      Prior Function Level of Independence: Needs assistance   Gait / Transfers Assistance Needed: walks with RW for gait  ADL's / Homemaking Assistance Needed: daughter does what housework is done  Extremity/Trunk Assessment   Upper Extremity Assessment: Generalized weakness           Lower Extremity Assessment: Generalized weakness      Cervical / Trunk Assessment: Other exceptions  Communication   Communication: Other (comment) (due to breathing difficulty  slower, labored, incr w/ walking)  Cognition Arousal/Alertness: Awake/alert Behavior During Therapy: WFL for tasks assessed/performed Overall Cognitive Status: Within Functional Limits for tasks assessed                         Exercises General Exercises - Lower Extremity Long Arc Quad: AROM;Both;10 reps;Seated Hip ABduction/ADduction: AROM;Both;10 reps;Seated Hip Flexion/Marching: AROM;Both;10 reps;Seated Toe Raises: AROM;Both;10 reps;Seated Heel Raises: AROM;Both;10 reps;Seated      Assessment/Plan    PT Assessment Patient needs continued PT services  PT Diagnosis Difficulty walking;Abnormality of gait;Generalized weakness   PT Problem List Decreased strength;Decreased activity tolerance;Decreased balance;Decreased mobility;Decreased knowledge of use of DME;Pain;Cardiopulmonary status limiting activity  PT Treatment Interventions DME instruction;Gait training;Functional mobility training;Therapeutic activities;Therapeutic exercise;Balance training;Neuromuscular re-education;Patient/family education   PT Goals (Current goals can be found in the Care Plan section) Acute Rehab PT Goals Patient Stated Goal: to go home before going to [facility] PT Goal Formulation: With patient Time For Goal Achievement: 02/09/15 Potential to Achieve Goals: Good    Frequency Min 3X/week    End of Session Equipment Utilized During Treatment: Gait belt;Oxygen Activity Tolerance: Patient limited by fatigue;Treatment limited secondary to medical complications (Comment) (limited by DOE) Patient left: in bed;with call bell/phone within reach;with family/visitor present Nurse Communication: Mobility status         Time: 1456-1530 PT Time Calculation (min) (ACUTE ONLY): 34 min   Charges:   PT Evaluation $Initial PT Evaluation Tier I: 1 Procedure PT Treatments $Gait Training: 8-22 mins    Alpa Salvo B. [place], [place], DPT [phone]   01/26/2015, 3:45 PM

## 2015-01-26 NOTE — Progress Notes (Signed)
Daily Progress Note   Patient Name: [name]       Date: 01/26/2015 DOB: July 31, 1950  Age: 64 y.o. MRN#: [id number] Attending Physician: Delfina Redwood, MD Primary Care Physician: Tivis Ringer, MD Admit Date: 01/25/2015  Reason for Consultation/Follow-up: Non pain symptom management and Pain control  Subjective: Patient breathing much easier this morning. She is still complaining of a headache. She is denying any chest pain. Her daughter Terrence Dupont is at the bedside, she feels like her mother looks better. Patient is alert, able to converse more. She is on 4 L nasal cannula at 97%; her heart rate is 79. She did not need any breakthrough medicine last night for shortness of breath or pain she is stating today that she wants to go home with hospice of [facility] to follow her when she is medically discharged. She states she is not ready for [employer]. Although she does go on to state that "when it's my time" she does not want to die at home.  Interval Events: Improved respiratory status Length of Stay: 1 day  Current Medications: Scheduled Meds:  . aspirin EC  81 mg Oral Daily  . budesonide-formoterol  2 puff Inhalation BID  . diltiazem  60 mg Oral Q8H  . enoxaparin (LOVENOX) injection  40 mg Subcutaneous Q24H  . ezetimibe  10 mg Oral Daily  . famotidine  20 mg Oral QHS  . feeding supplement  1 Container Oral TID BM  . feeding supplement (ENSURE ENLIVE)  237 mL Oral BID BM  . fluconazole  100 mg Oral Daily  .  HYDROmorphone (DILAUDID) injection  0.5 mg Intravenous 6 times per day  . levalbuterol  0.63 mg Nebulization Q6H  . methylPREDNISolone (SOLU-MEDROL) injection  125 mg Intravenous Once  . methylPREDNISolone (SOLU-MEDROL) injection  60 mg Intravenous Q6H  . pantoprazole  40 mg Oral BID  . sodium chloride  3 mL Intravenous Q12H  . thiamine  100 mg Oral Daily  . tiotropium  18 mcg Inhalation Daily    Continuous Infusions:    PRN Meds: guaifenesin, HYDROmorphone  (DILAUDID) injection, ibuprofen, LORazepam, oxyCODONE  Palliative Performance Scale: 40-50% %     Vital Signs: BP 139/80 mmHg  Pulse 79  Temp(Src) 98 F (36.7 C) (Oral)  Resp 14  Ht '5\' 6"'$  (1.676 m)  Wt 42.411 kg (93 lb 8 oz)  BMI 15.10 kg/m2  SpO2 92% SpO2: SpO2: 92 % O2 Device: O2 Device: Nasal Cannula O2 Flow Rate: O2 Flow Rate (L/min): 4 L/min  Intake/output summary:  Intake/Output Summary (Last 24 hours) at 01/26/15 0915 Last data filed at 01/26/15 0500  Gross per 24 hour  Intake    780 ml  Output    900 ml  Net   -120 ml   LBM:   Baseline Weight: Weight: 44.453 kg (98 lb) Most recent weight: Weight: 42.411 kg (93 lb 8 oz)  Physical Exam: Gen.: Cachectic older female; more alert and less respiratory distress Respiratory: Moist  congested , productive cough; increased work of breathing. Is lying in the bed  Cardiac: Heart rate 79              Additional Data Reviewed: Recent Labs     01/25/15  1005  WBC  7.1  HGB  13.2  PLT  165  NA  134*  BUN  7  CREATININE  0.53     Problem List:  Patient Active Problem List   Diagnosis Date Noted  . Atrial  tachycardia 01/25/2015  . COPD with acute exacerbation 01/25/2015  . Acute on chronic respiratory failure with hypoxia 01/25/2015  . Palliative care encounter   . Oropharyngeal candidiasis 01/08/2015  . Headache disorder 09/24/2014  . Carotid stenosis 09/24/2014  . Acute and chronic respiratory failure with hypoxia 06/23/2014  . Transient blindness of right eye 03/03/2014  . Chronic respiratory failure with hypoxia 07/20/2013  . Cardiomyopathy, ischemic - mild to moderate. EF ~45% 01/13/2013  . CAD (coronary artery disease), autologous vein bypass graft, cath 01/12/13 with Xience DES to VG-RCA 01/13/2013    Class: Acute  . Protein-calorie malnutrition, severe 01/11/2013  . Acute myocardial infarction, unspecified site, episode of care unspecified   . Esophageal dysmotility   . Esophageal stricture   .  Peripheral neuropathy   . C. difficile diarrhea   . PVD (peripheral vascular disease)- Rt SCA PTA   . Hypertension   . Hx of radiation therapy and chemo therapy   . 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 End stage COPD 10/27/2011  . Hyperlipidemia 07/31/2007  . Anxiety state 07/31/2007  . Depression 07/31/2007  . GERD 07/31/2007  . PEPTIC ULCER DISEASE 07/31/2007  . GASTRITIS, CHRONIC 03/17/2006  . HIATAL HERNIA 03/17/2006  . POLYP, COLON 01/21/2005  . HEMORRHOIDS 01/21/2005     Palliative Care Assessment & Plan    Code Status:  DNR  Goals of Care:  Continue medical management for now in hospital setting  Plan to return home with hospice of [facility] following  Symptom Management:  Dyspnea: Patient tolerating hydromorphone well; no undue sedation. We'll up titrate dose to 0.5 mg every 4 hours around-the-clock, every 2 hours as needed for breakthrough shortness of breath or pain. Patient today was able to articulate that she was taking oxycodone 15 mg tabs at least 4 times a day. Her daughter Terrence Dupont is at the bedside and does verify this  Anxiety: Continue Ativan as needed  Pain: Patient is still endorsing a headache, will order ibuprofen 400 mg every 6 hours as needed. Patient also has chronic back pain, ibuprofen will also help with this in addition to scheduled hydromorphone. Prior to discharge will see whether to switch patient to oral hydromorphone or to resume oxycodone as she was at home.    Psycho-social/Spiritual:  Desire for further Chaplaincy support:no   Prognosis: < 2 weeks Discharge Planning: Home with Hospice   Care plan was discussed with daughter  Thank you for allowing the Palliative Medicine Team to assist in the care of this patient.   Time In: 0830 Time Out: 0900 Total Time 30 min Prolonged Time Billed  no     Greater than 50%  of this time was spent counseling and coordinating care related to the above assessment and  plan.   Dory Horn, NP  01/26/2015, 9:15 AM  Please contact Palliative Medicine Team phone at [phone] for questions and concerns.

## 2015-01-26 NOTE — Progress Notes (Signed)
No further episodes of SVT   Currently on IV diltiazem at 5 mg per hour   Would transition to 60 mg po q 8 hours.  Can come as liquid.   Follow BP and HR  WIll be available as needed.    Dorris Carnes

## 2015-01-26 NOTE — Progress Notes (Signed)
Patient sitting on side of bed, alert but confused.  Patient able to tell RN her name.  Patient told RN the year was 67.  RN stated you've got the 16 right then patient stated the year was 2016.  RN stated correct.  RN then asked patient where she was and patient not sure.  Patient stated the room looked like a double wide trailer and patient thought maybe she was at home.  RN explained to patient she was at [facility].  Patient stated I'm suppose to be at home. It seems to RN that patient might be getting confused because she was recently admitted to the hospital in August and from that admission was discharged to home.  RN explained to patient that less than a month ago she was at the hospital and did go home for a little bit.  RN stated again that patient was at [facility].  RN asked patient if she remembered why she had to come to the hospital. Patient stated she didn't know.  RN explained to patient that she was having increased trouble breathing and some chest discomfort and hospice told patient to come to hospital.

## 2015-01-27 DIAGNOSIS — R000 Tachycardia, unspecified: Secondary | ICD-10-CM

## 2015-01-27 DIAGNOSIS — J449 Chronic obstructive pulmonary disease, unspecified: Secondary | ICD-10-CM

## 2015-01-27 DIAGNOSIS — I25810 Atherosclerosis of coronary artery bypass graft(s) without angina pectoris: Secondary | ICD-10-CM

## 2015-01-27 DIAGNOSIS — E43 Unspecified severe protein-calorie malnutrition: Secondary | ICD-10-CM

## 2015-01-27 LAB — BLOOD GAS, ARTERIAL
Acid-Base Excess: 9.9 mmol/L — ABNORMAL HIGH (ref 0.0–2.0)
BICARBONATE: 34.9 meq/L — AB (ref 20.0–24.0)
DRAWN BY: 331761
O2 Content: 3 L/min
O2 Saturation: 97.2 %
PH ART: 7.419 (ref 7.350–7.450)
PO2 ART: 88.3 mmHg (ref 80.0–100.0)
Patient temperature: 97.5
TCO2: 36.6 mmol/L (ref 0–100)
pCO2 arterial: 54.5 mmHg — ABNORMAL HIGH (ref 35.0–45.0)

## 2015-01-27 MED ORDER — MORPHINE SULFATE (CONCENTRATE) 10 MG/0.5ML PO SOLN
5.0000 mg | ORAL | Status: DC
  Administered 2015-01-27 – 2015-01-28 (×8): 5 mg via ORAL
  Filled 2015-01-27 (×7): qty 0.5

## 2015-01-27 MED ORDER — MORPHINE SULFATE (CONCENTRATE) 10 MG/0.5ML PO SOLN: 5.0000 mg | ORAL | Status: DC | PRN

## 2015-01-27 MED ORDER — DIAZEPAM 2 MG PO TABS: 2.0000 mg | ORAL_TABLET | ORAL | Status: DC | PRN

## 2015-01-27 MED ORDER — MORPHINE SULFATE (CONCENTRATE) 10 MG/0.5ML PO SOLN: 2.5000 mg | ORAL | Status: DC | PRN

## 2015-01-27 MED ORDER — ENSURE ENLIVE PO LIQD
237.0000 mL | Freq: Three times a day (TID) | ORAL | Status: DC
  Administered 2015-01-27 (×2): 237 mL via ORAL

## 2015-01-27 MED ORDER — METHYLPREDNISOLONE SODIUM SUCC 40 MG IJ SOLR
40.0000 mg | Freq: Three times a day (TID) | INTRAMUSCULAR | Status: DC
  Administered 2015-01-27 – 2015-01-28 (×3): 40 mg via INTRAVENOUS
  Filled 2015-01-27 (×3): qty 1

## 2015-01-27 NOTE — Evaluation (Addendum)
Occupational Therapy Evaluation Patient Details Name: [name] MRN: [id number] DOB: [birth year] Today's Date: 01/27/2015    History of Present Illness 64 y.o. female admitted to [facility] on 01/25/15 with SOB and chest pressure.  Dx with acute on chronic respiratory failure with hypoxia, end stage COPD with acute exacerbation, atrial tachycardia, and esophageal dysmobility/esophageal stricture, oropharyngeal candidiasis.  Pt with PMHx of depression/anxiety, COPD, PVD, CHF, HTN, lung CA, TIA, A-fib, on home O2 (4 L O2 ), CABG, low back surgery, multiple cardiac caths with stents/angioplasties.     Clinical Impression   Pt admitted with above. Pt independent with ADLs, PTA. Feel pt will benefit from acute OT to increase independence and reinforce energy conservation techniques.     Follow Up Recommendations  No OT follow up;Supervision - Intermittent    Equipment Recommendations  None recommended by OT    Recommendations for Other Services       Precautions / Restrictions Precautions Precautions: Fall;Other (comment) Precaution Comments: pt generally weak and deconditioned.  Monitor O2 sats and HR Restrictions Weight Bearing Restrictions: No      Mobility Bed Mobility               General bed mobility comments: pt sitting EOB  Transfers Overall transfer level: Needs assistance   Transfers: Sit to/from Stand Sit to Stand: Supervision              Balance  No LOB in session.                                           ADL Overall ADL's : Needs assistance/impaired     Grooming: Brushing hair;Set up;Supervision/safety;Standing               Lower Body Dressing: Set up;Supervision/safety;Sit to/from stand   Toilet Transfer: Min guard;Supervision/safety;Ambulation (sit to stand from bed)           Functional mobility during ADLs: Min guard;Supervision/safety General ADL Comments: Educated on energy conservation techniques.       Vision     Perception     Praxis      Pertinent Vitals/Pain Pain Assessment: 0-10 Pain Score: 9  Pain Location: back Pain Descriptors / Indicators: Aching Pain Intervention(s): Monitored during session;Limited activity within patient's tolerance   Used around 3.5L of O2 in session and monitor read pt's O2 dropping in 80's but appeared to not be accurate reading. Went up to 90s.     Hand Dominance     Extremity/Trunk Assessment Upper Extremity Assessment Upper Extremity Assessment: Generalized weakness;Overall [facility] for tasks assessed   Lower Extremity Assessment Lower Extremity Assessment: Defer to PT evaluation   Cervical / Trunk Assessment Cervical / Trunk Assessment: Other exceptions Cervical / Trunk Exceptions: h/o low back surgery   Communication Communication Communication: Other (comment) (due to breathing difficulty, labored)   Cognition Arousal/Alertness: Awake/alert Behavior During Therapy: WFL for tasks assessed/performed Overall Cognitive Status: overall Within Functional Limits for tasks assessed-however did note that pt was not following commands well when OT was giving directions on which way to turn or where to go when up and ambulating.                     General Comments       Exercises       Shoulder Instructions      Home Living Family/patient expects  to be discharged to:: Private residence Living Arrangements: Other relatives;Other (Comment) (37 y.o. grandson lives with her, according to PT eval) Available Help at Discharge: Family Type of Home: House Home Access: Ramped entrance     Home Layout: One level     Bathroom Shower/Tub: Tub only (garden tub)         Home Equipment: Environmental consultant - 2 wheels;Walker - 4 wheels;Wheelchair - Education officer, community - power;Electric scooter;Bedside commode;Shower seat;Tub bench (home O2-4L 24/7)          Prior Functioning/Environment Level of Independence: Needs assistance  Gait /  Transfers Assistance Needed: walks with RW for gait at times  ADL's / Homemaking Assistance Needed: daughter does what housework is done; pt steps over tub        OT Diagnosis: Generalized weakness   OT Problem List: Cardiopulmonary status limiting activity;Decreased knowledge of use of DME or AE;Decreased knowledge of precautions;Pain;Increased edema;Decreased strength;Decreased activity tolerance   OT Treatment/Interventions: Self-care/ADL training;DME and/or AE instruction;Therapeutic activities;Patient/family education;Balance training;Energy conservation;Therapeutic exercise    OT Goals(Current goals can be found in the care plan section) Acute Rehab OT Goals Patient Stated Goal: hopes to go home tomorrow OT Goal Formulation: With patient Time For Goal Achievement: 02/03/15 Potential to Achieve Goals: Good ADL Goals Pt Will Perform Tub/Shower Transfer: Tub transfer;with modified independence;ambulating;shower seat Additional ADL Goal #1: Pt will independently verbalize 3/3 energy conservation techniques and utilize during session.  OT Frequency: Min 2X/week   Barriers to D/C:            Co-evaluation              End of Session Equipment Utilized During Treatment: Gait belt;Oxygen Nurse Communication: Other (comment) (called tech about fixing pt's sheets)  Activity Tolerance: Patient tolerated treatment well Patient left: in bed;with call bell/phone within reach;with bed alarm set (lady cleaning her room)   Time: 5643-3295 OT Time Calculation (min): 16 min Charges:  OT General Charges $OT Visit: 1 Procedure OT Evaluation $Initial OT Evaluation Tier I: 1 Procedure G-CodesBenito Mccreedy OTR/L [id number] 01/27/2015, 11:43 AM

## 2015-01-27 NOTE — Progress Notes (Signed)
Pt refuse NIV, pt is stable at this time, and currently on bedside toilet. No distress or complications noted.

## 2015-01-27 NOTE — Progress Notes (Signed)
[facility] Work note Patient was admitted over the weekend with respiratory distress. She was awake and eating buiscut upon entry to the room. Palliative Medicine NP, Stanton Kidney was meeting with patient and daughter to review medications. LCSW reviewed options and patient and daughter share plan to return home. We discussed care needs and support available from hospice. Patient appears to be struggling with limits to independence as well as other grief issues. LCSW normalized her feelings, adjustment. LCSW reviewed that [facility] is not an option at this time, yet primary team will assess and work with family when the option becomes more appropriate.  LCSW offered support, education.  Katherina Right, [place]

## 2015-01-27 NOTE — Progress Notes (Signed)
Subjective:  Feeling a little better today, less SOB.  Objective:  Temp:  [97.4 F (36.3 C)-98.4 F (36.9 C)] 97.5 F (36.4 C) (09/05 0815) Pulse Rate:  [74-88] 88 (09/05 0815) Resp:  [12] 12 (09/04 1608) BP: (111-139)/(40-88) 124/83 mmHg (09/05 0815) SpO2:  [90 %-100 %] 100 % (09/05 0820) Weight change:   Intake/Output from previous day: 09/04 0701 - 09/05 0700 In: 597 [P.O.:597] Out: 625 [Urine:625]  Intake/Output from this shift:    Physical Exam: General appearance: alert and mild distress Neck: no adenopathy, no carotid bruit, no JVD, supple, symmetrical, trachea midline and thyroid not enlarged, symmetric, no tenderness/mass/nodules Lungs: Distant BS bilaterally with scattered wheezes Heart: regular rate and rhythm, S1, S2 normal, no murmur, click, rub or gallop Extremities: extremities normal, atraumatic, no cyanosis or edema  Lab Results: Results for orders placed or performed during the hospital encounter of 01/25/15 (from the past 48 hour(s))  I-Stat CG4 Lactic Acid, ED     Status: None   Collection Time: 01/25/15  9:59 AM  Result Value Ref Range   Lactic Acid, Venous 0.83 0.5 - 2.0 mmol/L  CBC with Differential/Platelet     Status: Abnormal   Collection Time: 01/25/15 10:05 AM  Result Value Ref Range   WBC 7.1 4.0 - 10.5 K/uL   RBC 4.21 3.87 - 5.11 MIL/uL   Hemoglobin 13.2 12.0 - 15.0 g/dL   HCT 39.6 36.0 - 46.0 %   MCV 94.1 78.0 - 100.0 fL   MCH 31.4 26.0 - 34.0 pg   MCHC 33.3 30.0 - 36.0 g/dL   RDW 12.9 11.5 - 15.5 %   Platelets 165 150 - 400 K/uL   Neutrophils Relative % 81 (H) 43 - 77 %   Neutro Abs 5.8 1.7 - 7.7 K/uL   Lymphocytes Relative 9 (L) 12 - 46 %   Lymphs Abs 0.6 (L) 0.7 - 4.0 K/uL   Monocytes Relative 9 3 - 12 %   Monocytes Absolute 0.6 0.1 - 1.0 K/uL   Eosinophils Relative 1 0 - 5 %   Eosinophils Absolute 0.1 0.0 - 0.7 K/uL   Basophils Relative 0 0 - 1 %   Basophils Absolute 0.0 0.0 - 0.1 K/uL  Basic metabolic panel      Status: Abnormal   Collection Time: 01/25/15 10:05 AM  Result Value Ref Range   Sodium 134 (L) 135 - 145 mmol/L   Potassium 3.3 (L) 3.5 - 5.1 mmol/L   Chloride 92 (L) 101 - 111 mmol/L   CO2 35 (H) 22 - 32 mmol/L   Glucose, Bld 102 (H) 65 - 99 mg/dL   BUN 7 6 - 20 mg/dL   Creatinine, Ser 0.53 0.44 - 1.00 mg/dL   Calcium 8.7 (L) 8.9 - 10.3 mg/dL   GFR calc non Af Amer >60 >60 mL/min   GFR calc Af Amer >60 >60 mL/min    Comment: (NOTE) The eGFR has been calculated using the CKD EPI equation. This calculation has not been validated in all clinical situations. eGFR's persistently <60 mL/min signify possible Chronic Kidney Disease.    Anion gap 7 5 - 15  Protime-INR     Status: None   Collection Time: 01/25/15 10:05 AM  Result Value Ref Range   Prothrombin Time 14.6 11.6 - 15.2 seconds   INR 1.12 0.00 - 1.49  Troponin I     Status: None   Collection Time: 01/25/15 10:05 AM  Result Value Ref Range  Troponin I 0.03 <0.031 ng/mL    Comment:        NO INDICATION OF MYOCARDIAL INJURY.   Brain natriuretic peptide     Status: Abnormal   Collection Time: 01/25/15 10:06 AM  Result Value Ref Range   B Natriuretic Peptide 634.8 (H) 0.0 - 100.0 pg/mL  Magnesium     Status: None   Collection Time: 01/25/15  3:17 PM  Result Value Ref Range   Magnesium 1.7 1.7 - 2.4 mg/dL  Phosphorus     Status: None   Collection Time: 01/25/15  3:17 PM  Result Value Ref Range   Phosphorus 3.7 2.5 - 4.6 mg/dL  TSH     Status: None   Collection Time: 01/25/15  3:17 PM  Result Value Ref Range   TSH 0.547 0.350 - 4.500 uIU/mL    Imaging: Imaging results have been reviewed  Tele- SR, had episode for SVT last PM  Assessment/Plan:   1. Principal Problem: 2.   Acute and chronic respiratory failure with hypoxia 3. Active Problems: 4.   Hyperlipidemia 5.   Anxiety state 6.   Depression 7.   End stage COPD 8.   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   Esophageal dysmotility 10.    Esophageal stricture 11.   Hypertension 12.   Protein-calorie malnutrition, severe 13.   CAD (coronary artery disease), autologous vein bypass graft, cath 01/12/13 with Xience DES to VG-RCA 14.   Oropharyngeal candidiasis 15.   Atrial tachycardia 16.   COPD with acute exacerbation 17.   Acute on chronic respiratory failure with hypoxia 18.   Palliative care encounter 19.   Time Spent Directly with Patient:  20 minutes  Length of Stay:  LOS: 2 days   Pt well known to me with complex PMH admiitted with increasing SOB and atrial tachyarrhythmias. Tele and EKG appeared to be MAT vs PSVT/ Aflutter improved with IV dilt. Getting inhaled bronchodilators. She has O2 dependent COPD and continues to smoke unfortunately. She is a DNR and has home Hospice care. Exam notable for distant BS C/W emphysema and scattered wheezes. Agree with no aggressive cardiac w/u. Would transition to PO dilt at DC (Avoid BB).   Quay Burow 01/27/2015, 8:35 AM

## 2015-01-27 NOTE — Progress Notes (Addendum)
Initial Nutrition Assessment  DOCUMENTATION CODES:   Severe malnutrition in context of chronic illness, Underweight  INTERVENTION:    Ensure Enlive PO TID, each supplement provides 350 kcal and 20 grams of protein  NUTRITION DIAGNOSIS:   Malnutrition related to chronic illness as evidenced by severe depletion of body fat, severe depletion of muscle mass.  GOAL:   Patient will meet greater than or equal to 90% of their needs  MONITOR:   PO intake, Supplement acceptance, Weight trends, Labs  REASON FOR ASSESSMENT:   Consult Assessment of nutrition requirement/status  ASSESSMENT:   64 year old female patient with history of ischemic cardiomyopathy with most recent EF 45%, known CAD and CABG status post drug-eluting stent to vein graft to RCA, known stage III non-small cell lung cancer diagnosed in 2013 status post chemoradiation, end-stage COPD oxygen dependent, recurrent esophagitis in setting of known esophageal dysmotility and esophageal stricture. Patient was recently discharged on 8/19 after an admission for chest pain. Admitted on 9/3 with chest pain and tacchycardia.    Labs reviewed: sodium and potassium are low.  Patient reports that she has lost a lot of weight since her back surgery. She is eating pretty good today. Nutrition-Focused physical exam completed. Findings are severe fat depletion, severe muscle depletion, and no edema. Patient likes Ensure supplements, buys them when she can afford them. Discussed less expensive alternatives to Ensure. Will provide patient with coupons for Ensure as well.  Patient being followed by [facility], plans for d/c home with Hospice.  Diet Order:  Diet regular Room service appropriate?: Yes; Fluid consistency:: Thin  Skin:  Reviewed, no issues  Last BM:  9/5  Height:   Ht Readings from Last 1 Encounters:  01/25/15 '5\' 6"'$  (1.676 m)    Weight:   Wt Readings from Last 1 Encounters:  01/26/15 93 lb 8 oz (42.411  kg)    Ideal Body Weight:  59.1 kg  BMI:  Body mass index is 15.1 kg/(m^2).  Estimated Nutritional Needs:   Kcal:  1500-1700  Protein:  70-85 gm  Fluid:  1.5-1.7 L  EDUCATION NEEDS:   No education needs identified at this time  Molli Barrows, [place], [place], [place] Pager [phone] After Hours Pager [phone]

## 2015-01-27 NOTE — Progress Notes (Signed)
Physical Therapy Treatment Patient Details Name: [name] MRN: [id number] DOB: [birth year] Today's Date: 01/27/2015    History of Present Illness 64 y.o. female admitted to [facility] on 01/25/15 with SOB and chest pressure.  Dx with acute on chronic respiratory failure with hypoxia, end stage COPD with acute exacerbation, atrial tachycardia, and esophageal dysmobility/esophageal stricture, oropharyngeal candidiasis.  Pt with PMHx of depression/anxiety, COPD, PVD, CHF, HTN, lung CA, TIA, A-fib, on home O2 (4 L O2 [place]), CABG, low back surgery, multiple cardiac caths with stents/angioplasties.      PT Comments    Pt is progressing well with her mobility.  She did not need RW today and took fewer standing rest breaks.  O2 sats stable on 3 L O2 [place].  PT will continue to follow acutely for activity progression.   Follow Up Recommendations  No PT follow up     Equipment Recommendations  None recommended by PT    Recommendations for Other Services   NA     Precautions / Restrictions Precautions Precautions: Fall;Other (comment) Precaution Comments: pt generally weak and deconditioned.  Monitor O2 sats and HR Restrictions Weight Bearing Restrictions: No    Mobility  Bed Mobility               General bed mobility comments: pt sitting EOB.  Reporting her feet are swelling (I encouraged elevation and walking)  Transfers Overall transfer level: Needs assistance Equipment used: None Transfers: Sit to/from Stand Sit to Stand: Min guard         General transfer comment: Min guard assist for safety as pt did not choose to use AD today.   Ambulation/Gait Ambulation/Gait assistance: Min guard Ambulation Distance (Feet): 300 Feet Assistive device: None (pt pushing her own O2 tank) Gait Pattern/deviations: Step-through pattern;Shuffle Gait velocity: decreased, but better than yesterday Gait velocity interpretation: Below normal speed for age/gender General Gait Details: Pt with  better gait speed today, no standing rest breaks.  O2 sats stable and DOE stable on 3 L O2 [place].  Pt with coughing episodes throughout session.           Balance Overall balance assessment: Needs assistance Sitting-balance support: Feet supported;No upper extremity supported Sitting balance-Leahy Scale: Good     Standing balance support: No upper extremity supported Standing balance-Leahy Scale: Fair                      Cognition Arousal/Alertness: Awake/alert Behavior During Therapy: WFL for tasks assessed/performed Overall Cognitive Status: Within Functional Limits for tasks assessed                             Pertinent Vitals/Pain Pain Assessment: 0-10 Pain Score: 6  Pain Location: back Pain Descriptors / Indicators: Aching;Constant Pain Intervention(s): Limited activity within patient's tolerance;Monitored during session;Repositioned    Home Living Family/patient expects to be discharged to:: Private residence Living Arrangements: Other relatives;Other (Comment) (52 y.o. grandson lives with her, according to PT eval) Available Help at Discharge: Family Type of Home: House Home Access: Ramped entrance   Home Layout: One level Home Equipment: Environmental consultant - 2 wheels;Walker - 4 wheels;Wheelchair - Education officer, community - power;Electric scooter;Bedside commode;Shower seat;Tub bench (home O2-4L 24/7)      Prior Function Level of Independence: Needs assistance  Gait / Transfers Assistance Needed: walks with RW for gait at times ADL's / Homemaking Assistance Needed: daughter does what housework is done     PT Goals (  current goals can now be found in the care plan section) Acute Rehab PT Goals Patient Stated Goal: hopes to go home tomorrow Progress towards PT goals: Progressing toward goals    Frequency  Min 3X/week    PT Plan Current plan remains appropriate       End of Session Equipment Utilized During Treatment: Oxygen Activity Tolerance: Patient  limited by fatigue;Treatment limited secondary to medical complications (Comment) (limited by coughing) Patient left: in bed;with call bell/phone within reach;with family/visitor present (seated EOB with family)     Time: 1638-4536 PT Time Calculation (min) (ACUTE ONLY): 13 min  Charges:  $Gait Training: 8-22 mins                     Idalie Canto B. Dawnn Nam, PT, DPT [phone]   01/27/2015, 3:05 PM

## 2015-01-27 NOTE — Progress Notes (Signed)
PROGRESS NOTE  [name] BOF:[id number] DOB: [birth year] DOA: 01/25/2015 PCP: Tivis Ringer, MD  HPI/Recap of past 24 hours: 64 of fever with past medical history of chronic respiratory failure from end-stage COPD and non-small cell lung cancer stage III who has been under hospice care admitted on 9/3 for acute on chronic shortness of breath plus found to be in multifocal atrial tachycardia/SVT. Cardiology and palliative care consulted. Patient started on Cardizem drip, converting to sinus rhythm, palliative care saw patient starting her on scheduled doses of opiates to decrease work of breathing.  By 9/5, patient feeling much better. Complains of some burning in her nose with the oxygen has dried her out. States that breathing is easier than when she first came in, although not at baseline. Pain is better controlled as well.  Assessment/Plan: Principal Problem:   Acute on chronic respiratory failure with hypoxia/hypercarbia secondary to acute on end-stage COPD exacerbation plus underlying stage IIIB squamous cell carcinoma of the right lung: Improving with nebulizers, will taper down steroids, Roxanol appears to decrease work of breathing well. Given concerns for chronic respiratory failure, check ABG noting patient had 100% on 4 L and with some hypercarbia, will keep patient on 3 L Active Problems:    Anxiety state: Stable with medications   Depression: Stable continue home medication    Esophageal dysmotility   Esophageal stricture   Hypertension   Protein-calorie malnutrition, severe: Patient meets criteria for severe malnutrition in the context of chronic illness, plus underweight. On ensure enlive twice a day. Appreciate nutrition assistance.    Oropharyngeal candidiasis: On Diflucan   Atrial tachycardia, multifocal: Responded to IV Cardizem which has been changed to by mouth.    Palliative care encounter   Code Status: DO NOT RESUSCITATE  Family Communication: Daughter  at the bedside  Disposition Plan: Anticipate home tomorrow, as long as tolerating by mouth pain medications    Consultants: Cardiology Palliative medicine  Procedures:  None  Antibiotics:  Diflucan 9/3-present   Objective: BP 96/81 mmHg  Pulse 80  Temp(Src) 98.8 F (37.1 C) (Oral)  Resp 12  Ht '5\' 6"'$  (1.676 m)  Wt 42.411 kg (93 lb 8 oz)  BMI 15.10 kg/m2  SpO2 100%  Intake/Output Summary (Last 24 hours) at 01/27/15 1311 Last data filed at 01/27/15 0441  Gross per 24 hour  Intake    597 ml  Output    625 ml  Net    -28 ml   Filed Weights   01/25/15 0923 01/26/15 0410  Weight: 44.453 kg (98 lb) 42.411 kg (93 lb 8 oz)    Exam:   General:  alert and oriented 3, emaciated   Cardiovascular: regular rate and rhythm, S1 and S2  Respiratory: bilateral end expiratory wheeze, decreased breath sounds throughout   Abdomen:  soft, nontender, nondistended, hypoactive bowel sounds    extremities: No clubbing or cyanosis or edema  Data Reviewed: Basic Metabolic Panel:  Recent Labs Lab 01/25/15 1005 01/25/15 1517  NA 134*  --   K 3.3*  --   CL 92*  --   CO2 35*  --   GLUCOSE 102*  --   BUN 7  --   CREATININE 0.53  --   CALCIUM 8.7*  --   MG  --  1.7  PHOS  --  3.7   Liver Function Tests: No results for input(s): AST, ALT, ALKPHOS, BILITOT, PROT, ALBUMIN in the last 168 hours. No results for input(s): LIPASE, AMYLASE in the last  168 hours. No results for input(s): AMMONIA in the last 168 hours. CBC:  Recent Labs Lab 01/25/15 1005  WBC 7.1  NEUTROABS 5.8  HGB 13.2  HCT 39.6  MCV 94.1  PLT 165   Cardiac Enzymes:    Recent Labs Lab 01/25/15 1005  TROPONINI 0.03   BNP (last 3 results)  Recent Labs  09/27/14 1323 01/08/15 0918 01/25/15 1006  BNP 199.1* 261.2* 634.8*    ProBNP (last 3 results)  Recent Labs  03/03/14 1147  PROBNP 475.8*    CBG: No results for input(s): GLUCAP in the last 168 hours.  No results found for this or  any previous visit (from the past 240 hour(s)).   Studies: No results found.  Scheduled Meds: . aspirin EC  81 mg Oral Daily  . budesonide-formoterol  2 puff Inhalation BID  . diltiazem  60 mg Oral Q8H  . docusate sodium  100 mg Oral BID  . enoxaparin (LOVENOX) injection  30 mg Subcutaneous Q24H  . ezetimibe  10 mg Oral Daily  . famotidine  20 mg Oral QHS  . feeding supplement  1 Container Oral TID BM  . feeding supplement (ENSURE ENLIVE)  237 mL Oral TID BM  . fluconazole  100 mg Oral Daily  . levalbuterol  0.63 mg Nebulization Q6H  . methylPREDNISolone (SOLU-MEDROL) injection  125 mg Intravenous Once  . methylPREDNISolone (SOLU-MEDROL) injection  60 mg Intravenous Q6H  . morphine CONCENTRATE  5 mg Oral Q4H  . pantoprazole  40 mg Oral BID  . sodium chloride  3 mL Intravenous Q12H  . thiamine  100 mg Oral Daily  . tiotropium  18 mcg Inhalation Daily    Continuous Infusions:    Time spent: [place] [facility] Pager [phone]. If 7PM-7AM, please contact night-coverage at [email or web address], password [facility] 01/27/2015, 1:11 PM  LOS: 2 days

## 2015-01-27 NOTE — Care Management Note (Signed)
Case Management Note  Patient Details  Name: [name] MRN: [id number] Date of Birth: 1951/04/05  Subjective/Objective:       Adm w resp failure             Action/Plan:lives   Expected Discharge Date:                  Expected Discharge Plan:  [place]  In-House Referral:     Discharge planning Services     Post Acute Care Choice:    Choice offered to:     DME Arranged:    DME Agency:     HH Arranged:    [place] Agency:     Status of Service:     Medicare Important Message Given:    Date Medicare IM Given:    Medicare IM give by:    Date Additional Medicare IM Given:    Additional Medicare Important Message give by:     If discussed at [place] of Stay Meetings, dates discussed:    Additional Comments: lives w grandson, ur review done  Lacretia Leigh, RN 01/27/2015, 10:25 AM

## 2015-01-27 NOTE — Progress Notes (Signed)
Daily Progress Note   Patient Name: [name]       Date: 01/27/2015 DOB: January 09, 1951  Age: 64 y.o. MRN#: [id number] Attending Physician: Annita Brod, MD Primary Care Physician: Tivis Ringer, MD Admit Date: 01/25/2015  Reason for Consultation/Follow-up: Non pain symptom management and Pain control  Subjective: Patient resting in bed eating breakfast sandwich from [employer].  No increased WOB.  O/C of  of a headache.   Daughter Terrence Dupont is at the bedside   Patient is alert, able to participate in conversation and plan for discharge and EOL wishes  Plan is  to go home with [facility] when she is medically stable. Will need to convert her medications to oral agents today in preparation for discharge home.  [facility] social worker Threasa Beards came to room during visit, we discussed importance of continued conversation with patient regarding anticipatory care needs, symptom management once discahrged   Interval Events: Improved respiratory status  Length of Stay: 2 days  Current Medications: Scheduled Meds:  . aspirin EC  81 mg Oral Daily  . budesonide-formoterol  2 puff Inhalation BID  . diltiazem  60 mg Oral Q8H  . docusate sodium  100 mg Oral BID  . enoxaparin (LOVENOX) injection  30 mg Subcutaneous Q24H  . ezetimibe  10 mg Oral Daily  . famotidine  20 mg Oral QHS  . feeding supplement  1 Container Oral TID BM  . feeding supplement (ENSURE ENLIVE)  237 mL Oral BID BM  . fluconazole  100 mg Oral Daily  . levalbuterol  0.63 mg Nebulization Q6H  . methylPREDNISolone (SOLU-MEDROL) injection  125 mg Intravenous Once  . methylPREDNISolone (SOLU-MEDROL) injection  60 mg Intravenous Q6H  . morphine CONCENTRATE  5 mg Oral Q4H  . pantoprazole  40 mg Oral BID  . sodium chloride  3 mL Intravenous Q12H  . thiamine  100 mg Oral Daily  . tiotropium  18 mcg Inhalation Daily    Continuous Infusions:    PRN Meds: diazepam, HYDROcodone-homatropine, ibuprofen, morphine  CONCENTRATE, oxyCODONE  Palliative Performance Scale: 40%     Vital Signs: BP 124/83 mmHg  Pulse 88  Temp(Src) 97.5 F (36.4 C) (Oral)  Resp 12  Ht '5\' 6"'$  (1.676 m)  Wt 42.411 kg (93 lb 8 oz)  BMI 15.10 kg/m2  SpO2 100% SpO2: SpO2: 100 % O2 Device: O2 Device: Nasal Cannula O2 Flow Rate: O2 Flow Rate (L/min): 3 L/min  Intake/output summary:   Intake/Output Summary (Last 24 hours) at 01/27/15 0923 Last data filed at 01/27/15 0441  Gross per 24 hour  Intake    597 ml  Output    625 ml  Net    -28 ml   LBM:   Baseline Weight: Weight: 44.453 kg (98 lb) Most recent weight: Weight: 42.411 kg (93 lb 8 oz)  Physical Exam:  Gen.: Cachectic frail 64 yo female;  Alert, increased dyspnea with minimal exertion Heent- moist buccal membranes, no exudate, gross temporal muscle wasting Respiratory: Diminished throughout, productive cough  Cardiac: RRR Skin: warm and dry             Additional Data Reviewed: Recent Labs     01/25/15  1005  WBC  7.1  HGB  13.2  PLT  165  NA  134*  BUN  7  CREATININE  0.53     Problem List:  Patient Active Problem List   Diagnosis Date Noted  . Atrial tachycardia 01/25/2015  . COPD with acute exacerbation  01/25/2015  . Acute on chronic respiratory failure with hypoxia 01/25/2015  . Palliative care encounter   . Oropharyngeal candidiasis 01/08/2015  . Headache disorder 09/24/2014  . Carotid stenosis 09/24/2014  . Acute and chronic respiratory failure with hypoxia 06/23/2014  . Transient blindness of right eye 03/03/2014  . Chronic respiratory failure with hypoxia 07/20/2013  . Cardiomyopathy, ischemic - mild to moderate. EF ~45% 01/13/2013  . CAD (coronary artery disease), autologous vein bypass graft, cath 01/12/13 with Xience DES to VG-RCA 01/13/2013    Class: Acute  . Protein-calorie malnutrition, severe 01/11/2013  . Acute myocardial infarction, unspecified site, episode of care unspecified   . Esophageal dysmotility   .  Esophageal stricture   . Peripheral neuropathy   . C. difficile diarrhea   . PVD (peripheral vascular disease)- Rt SCA PTA   . Hypertension   . Hx of radiation therapy and chemo therapy   . 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 End stage COPD 10/27/2011  . Hyperlipidemia 07/31/2007  . Anxiety state 07/31/2007  . Depression 07/31/2007  . GERD 07/31/2007  . PEPTIC ULCER DISEASE 07/31/2007  . GASTRITIS, CHRONIC 03/17/2006  . HIATAL HERNIA 03/17/2006  . POLYP, COLON 01/21/2005  . HEMORRHOIDS 01/21/2005     Palliative Care Assessment & Plan    Code Status:  DNR  Goals of Care:   Plan to return home with hospice of [facility] management of chronic disease of COPD, remains hopeful for more continued quality of life  Symptom Management:   Dyspnea: Convert to oral agents             -Roxanol 5 mg po/sl every 4 hrs scheduled and every every 2 hrs prn    Anxiety: Ativan "it made me feel awful last night, I take Valium at home"               -dc ativan                - Valium 2 mg po every 6 hrs prn    Pain:  Patient has chronic back pain, "I have rods in my back"               Patient today was able to articulate that she was taking oxycodone 15 mg tabs at least 4 times a day. Her daughter Terrence Dupont is at the bedside and does verify this.  Oxycodone for back pain unchanged at patient request   Psycho-social/Spiritual:   Desire for further Chaplaincy support: declined at this time   Prognosis: < 6 months prognosis Discharge Planning: Home with Hospice   Care plan was discussed with daughter and Dr Maryland Pink and Ronelle Nigh from [facility]  Thank you for allowing the Palliative Medicine Team to assist in the care of this patient.   Time In: 0800 Time Out: 0845 Total Time 45 min Prolonged Time Billed  no     Greater than 50%  of this time was spent counseling and coordinating care related to the above assessment and plan.   Knox Royalty, NP  01/27/2015, 9:23 AM  Please contact Palliative Medicine Team phone at [phone] for questions and concerns.

## 2015-01-28 MED ORDER — PREDNISONE 10 MG PO TABS: ORAL_TABLET | ORAL | Status: DC

## 2015-01-28 MED ORDER — PREDNISONE 20 MG PO TABS
40.0000 mg | ORAL_TABLET | Freq: Every day | ORAL | Status: DC
  Administered 2015-01-28: 40 mg via ORAL
  Filled 2015-01-28: qty 2

## 2015-01-28 MED ORDER — MORPHINE SULFATE (CONCENTRATE) 10 MG/0.5ML PO SOLN: 5.0000 mg | ORAL | Status: DC

## 2015-01-28 MED ORDER — ENSURE ENLIVE PO LIQD: 237.0000 mL | Freq: Three times a day (TID) | ORAL | Status: AC

## 2015-01-28 MED ORDER — DILTIAZEM 12 MG/ML ORAL SUSPENSION
60.0000 mg | Freq: Four times a day (QID) | ORAL | Status: DC
  Filled 2015-01-28 (×4): qty 6

## 2015-01-28 MED ORDER — DIAZEPAM 2 MG PO TABS: 2.0000 mg | ORAL_TABLET | ORAL | Status: DC | PRN

## 2015-01-28 MED ORDER — HYDROCODONE-HOMATROPINE 5-1.5 MG/5ML PO SYRP: 5.0000 mL | ORAL_SOLUTION | Freq: Four times a day (QID) | ORAL | Status: DC | PRN

## 2015-01-28 MED ORDER — BOOST / RESOURCE BREEZE PO LIQD: 1.0000 | Freq: Three times a day (TID) | ORAL | Status: DC

## 2015-01-28 MED ORDER — MORPHINE SULFATE (CONCENTRATE) 10 MG/0.5ML PO SOLN: 2.5000 mg | ORAL | Status: DC | PRN

## 2015-01-28 MED ORDER — THIAMINE HCL 100 MG PO TABS: 100.0000 mg | ORAL_TABLET | Freq: Every day | ORAL | Status: DC

## 2015-01-28 MED ORDER — DILTIAZEM 12 MG/ML ORAL SUSPENSION: 60.0000 mg | Freq: Four times a day (QID) | ORAL | Status: AC

## 2015-01-28 NOTE — Care Management Note (Signed)
Case Management Note  Patient Details  Name: [name] MRN: [id number] Date of Birth: Nov 04, 1950  Subjective/Objective: Pt is active with Hospice and [place].                 Action/Plan: Plan for d/c today. Hospice is aware. No further needs from CM at this time.    Expected Discharge Date:                  Expected Discharge Plan:  Home w Hospice Care  In-House Referral:  Hospice / Palliative Care  Discharge planning Services  CM Consult  Post Acute Care Choice:  NA Choice offered to:  NA  DME Arranged:  N/A DME Agency:  NA  HH Arranged:  RN HH Agency:  [facility]  Status of Service:  Completed, signed off  Medicare Important Message Given:    Date Medicare IM Given:    Medicare IM give by:    Date Additional Medicare IM Given:    Additional Medicare Important Message give by:     If discussed at [place] of Stay Meetings, dates discussed:    Additional Comments:  Bethena Roys, RN 01/28/2015, 11:19 AM

## 2015-01-28 NOTE — Progress Notes (Signed)
Inpatient [facility] RN Visit. This is a related admission to [facility] DX of Lung Cancer and CAD. Pt. Is a DNR code status. Pt. Seen in room sitting on the edge of her bed. Her grandson is sitting at her bedside. O2 continues at 3L n/c and pt. remains short of breath more with exertion. Her lower legs are moderately swollen and her MD came in and encouraged her to keep them elevated at home. Pt. State she is still unsure if she is ready for a hospital bed. Patient has received a total of 30 mg PO of Morphine concentrate and 30 mg of Oxycodone PO in the last 24  Hours. [facility] will f/u with patient at home tomorrow. Please call with any questions.  [facility] Liaison [phone]

## 2015-01-28 NOTE — Progress Notes (Signed)
Physical Therapy Treatment Patient Details Name: [name] MRN: [id number] DOB: [birth year] Today's Date: 01/28/2015    History of Present Illness 64 y.o. female admitted to [facility] on 01/25/15 with SOB and chest pressure.  Dx with acute on chronic respiratory failure with hypoxia, end stage COPD with acute exacerbation, atrial tachycardia, and esophageal dysmobility/esophageal stricture, oropharyngeal candidiasis.  Pt with PMHx of depression/anxiety, COPD, PVD, CHF, HTN, lung CA, TIA, A-fib, on home O2 (4 L O2 [place]), CABG, low back surgery, multiple cardiac caths with stents/angioplasties.      PT Comments    Pt was able to progress to gait without an assistive device today and is doing better than PTA.  She remained stable in both her O2 sats and HR on 3 L O2 [place] during activity.  She plans to d/c home today.  PT will follow acutely until d/c.  Follow Up Recommendations  No PT follow up     Equipment Recommendations  None recommended by PT    Recommendations for Other Services   NA     Precautions / Restrictions Precautions Precautions: Fall;Other (comment) Precaution Comments: pt generally weak and deconditioned.  Monitor O2 sats and HR    Mobility  Bed Mobility               General bed mobility comments: pt seated EOB  Transfers Overall transfer level: Needs assistance Equipment used: None Transfers: Sit to/from Stand Sit to Stand: Supervision         General transfer comment: supervision for safety as it is quite effortful for pt to get to standing over swollen feet  Ambulation/Gait Ambulation/Gait assistance: Min guard Ambulation Distance (Feet): 300 Feet Assistive device: None Gait Pattern/deviations: Step-through pattern;Staggering left;Staggering right Gait velocity: decreased Gait velocity interpretation: Below normal speed for age/gender General Gait Details: Pt with mildly staggering gait pattern, DOE 3/4 with gait, O2 sats stable on 2 L O2 [place] and well  as HR.            Balance Overall balance assessment: Needs assistance Sitting-balance support: Feet supported;Bilateral upper extremity supported Sitting balance-Leahy Scale: Good     Standing balance support: No upper extremity supported Standing balance-Leahy Scale: Good                      Cognition Arousal/Alertness: Awake/alert Behavior During Therapy: WFL for tasks assessed/performed Overall Cognitive Status: Within Functional Limits for tasks assessed       Memory: Decreased short-term memory                     Pertinent Vitals/Pain Pain Assessment: No/denies pain           PT Goals (current goals can now be found in the care plan section) Acute Rehab PT Goals Patient Stated Goal: go home Progress towards PT goals: Progressing toward goals    Frequency  Min 3X/week    PT Plan Current plan remains appropriate       End of Session Equipment Utilized During Treatment: Oxygen Activity Tolerance: Patient limited by fatigue;Patient limited by pain Patient left: in bed;with call bell/phone within reach;with family/visitor present (seated EOB)     Time: 5188-4166 PT Time Calculation (min) (ACUTE ONLY): 17 min  Charges:  $Gait Training: 8-22 mins                      Jaheim Canino B. Sheridan, [place], DPT [phone]   01/28/2015, 5:18 PM

## 2015-01-28 NOTE — Progress Notes (Addendum)
Occupational Therapy Treatment Patient Details Name: [name] MRN: [id number] DOB: [birth year] Today's Date: 01/28/2015    History of present illness 64 y.o. female admitted to [facility] on 01/25/15 with SOB and chest pressure.  Dx with acute on chronic respiratory failure with hypoxia, end stage COPD with acute exacerbation, atrial tachycardia, and esophageal dysmobility/esophageal stricture, oropharyngeal candidiasis.  Pt with PMHx of depression/anxiety, COPD, PVD, CHF, HTN, lung CA, TIA, A-fib, on home O2 (4 L O2 [place]), CABG, low back surgery, multiple cardiac caths with stents/angioplasties.     OT comments  Pt progressing toward goals. Education provided in session.  Follow Up Recommendations  No OT follow up;Supervision - Intermittent    Equipment Recommendations  None recommended by OT    Recommendations for Other Services      Precautions / Restrictions Precautions Precautions: Fall;Other (comment) Precaution Comments: pt generally weak and deconditioned.  Monitor O2 sats and HR Restrictions Weight Bearing Restrictions: No       Mobility Bed Mobility Overal bed mobility: Needs Assistance Bed Mobility: Supine to Sit     Supine to sit: Supervision        Transfers Overall transfer level: Needs assistance   Transfers: Sit to/from Stand Sit to Stand: Min guard-Min assist;Supervision         General transfer comment: initial LOB with first stand from bed, but did better later    Balance  Pushed O2 tank during ambulation.                                 ADL Overall ADL's : Needs assistance/impaired                     Lower Body Dressing: Sitting/lateral leans;Set up (donned footies)   Toilet Transfer: Supervision/safety;Ambulation (pushed O2 tank;pt with LOB with initial stand from bed-Min guard-Min assist, but did better later in session)       Tub/ Shower Transfer: Tub transfer;Supervision/safety;Ambulation Tub/Shower Transfer  Details (indicate cue type and reason): pt intially holding onto therapist when stepping over simulated tub, but then able to perform without doing so. Functional mobility during ADLs: Supervision/safety (pushed O2 tank) General ADL Comments: Educated on energy conservation techniques as well as deep breathing technique. Educated on safety such as sitting for LB dressing. Recommended someone be with her for tub transfer.  Discussed AE. Cues to take breaks. Gave pt energy conservation handout.      Vision                     Perception     Praxis      Cognition  Awake/Alert Behavior During Therapy: WFL for tasks assessed/performed Overall Cognitive Status: Within Functional Limits for tasks assessed       Memory: Decreased short-term memory               Extremity/Trunk Assessment               Exercises     Shoulder Instructions       General Comments      Pertinent Vitals/ Pain       Pain Assessment: 0-10 Pain Score: 8  Pain Location: head Pain Descriptors / Indicators: Headache Pain Intervention(s): Monitored during session;Patient requesting pain meds-RN notified   Pt used 3L of O2 in session and O2 in 90s towards end of session. Monitor reading in 80s earlier in session, but unsure  of accuracy of reading.  Home Living                                          Prior Functioning/Environment              Frequency Min 2X/week     Progress Toward Goals  OT Goals(current goals can now be found in the care plan section)  Progress towards OT goals: Progressing toward goals  Acute Rehab OT Goals Patient Stated Goal: go home OT Goal Formulation: With patient Time For Goal Achievement: 02/03/15 Potential to Achieve Goals: Good ADL Goals Pt Will Perform Tub/Shower Transfer: Tub transfer;with modified independence;ambulating;shower seat Additional ADL Goal #1: Pt will independently verbalize 3/3 energy conservation  techniques and utilize during session.  Plan Discharge plan remains appropriate    Co-evaluation                 End of Session Equipment Utilized During Treatment: Oxygen;Gait belt   Activity Tolerance Patient tolerated treatment well   Patient Left in bed;with call bell/phone within reach;with family/visitor present   Nurse Communication Patient requests pain meds        Time: 9371-6967 OT Time Calculation (min): 19 min  Charges: OT General Charges $OT Visit: 1 Procedure OT Treatments $Self Care/Home Management : 8-22 mins  Benito Mccreedy OTR/L [phone] 01/28/2015, 9:41 AM

## 2015-01-28 NOTE — Progress Notes (Signed)
Pt family concerned about more swollen LE . Kindly address per pt/family. Thanks!

## 2015-01-28 NOTE — Progress Notes (Signed)
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 Gwenlyn Found Patient Profile: 64yo female w/ PMH of ischemic cardiomyopathy (EF 45%-50% on 01/09/2015), CAD (s/p CABG 1995 with subsequent interventions), Stage III NSCLC (s/p chemoradiation), HLD, PVD, tobacco abuse and COPD (on 4L [place]) noted to be in atrial tachycardia upon arrival to the ED on 01/25/2015.  SUBJECTIVE: Reports new onset swelling in her feet bilaterally over the past 2 days. Reports some palpitations when at rest. Denies any chest pain or changes in her breathing status.  OBJECTIVE Filed Vitals:   01/27/15 2352 01/28/15 0333 01/28/15 0419 01/28/15 0800  BP: 120/70  115/64 136/86  Pulse: 102  130 97  Temp: 97.9 F (36.6 C)  97.6 F (36.4 C) 98.5 F (36.9 C)  TempSrc: Oral  Oral Oral  Resp: '20  18 20  '$ Height:      Weight:      SpO2: 100% 97% 100% 100%    Intake/Output Summary (Last 24 hours) at 01/28/15 0846 Last data filed at 01/28/15 0600  Gross per 24 hour  Intake    600 ml  Output    800 ml  Net   -200 ml   Filed Weights   01/25/15 0923 01/26/15 0410  Weight: 98 lb (44.453 kg) 93 lb 8 oz (42.411 kg)    PHYSICAL EXAM General: Frail, thin-appearing, female in no acute distress. Head: Normocephalic, atraumatic.  Neck: Supple without bruits, JVD not elevated. Lungs:  Resp regular and unlabored, decreased  breath sounds at bases. Expiratory wheeze noted. Heart: Tachycardiac rate, regular rhythm, S1, S2, no S3, S4, or murmur; no rub. Abdomen: Soft, non-tender, non-distended with normoactive bowel sounds. No hepatomegaly. No rebound/guarding. No obvious abdominal masses. Extremities: No clubbing or cyanosis.. Distal pedal pulses are 2+ bilaterally. 2+ pitting edema at feet bilaterally. Neuro: Alert and oriented X 3. Moves all extremities spontaneously. Psych: Normal affect.   LABS: CBC: Recent Labs  01/25/15 1005  WBC 7.1  NEUTROABS 5.8  HGB 13.2  HCT 39.6  MCV 94.1  PLT 165   INR: Recent Labs  01/25/15 1005  INR 1.51   Basic Metabolic Panel: Recent Labs  01/25/15 1005 01/25/15 1517  NA 134*  --   K 3.3*  --   CL 92*  --   CO2 35*  --   GLUCOSE 102*  --   BUN 7  --   CREATININE 0.53  --   CALCIUM 8.7*  --   MG  --  1.7  PHOS  --  3.7   Cardiac Enzymes: Recent Labs  01/25/15 1005  TROPONINI 0.03   BNP:  B NATRIURETIC PEPTIDE  Date/Time Value Ref Range Status  01/25/2015 10:06 AM 634.8* 0.0 - 100.0 pg/mL Final  01/08/2015 09:18 AM 261.2* 0.0 - 100.0 pg/mL Final    Thyroid Function Tests: Recent Labs  01/25/15 1517  TSH 0.547  TELE:  Sinus tachycardia with rate in 120's - 140's. Lowest HR noted in past 24 hours is 80bpm.      Current Medications:  . aspirin EC  81 mg Oral Daily  . budesonide-formoterol  2 puff Inhalation BID  . diltiazem  60 mg Oral Q8H  . docusate sodium  100 mg Oral BID  . enoxaparin (LOVENOX) injection  30 mg Subcutaneous Q24H  . ezetimibe  10 mg Oral Daily  . famotidine  20 mg Oral QHS  . feeding supplement  1 Container Oral TID BM  . feeding supplement (ENSURE ENLIVE)  237 mL Oral TID BM  . fluconazole  100 mg Oral Daily  . levalbuterol  0.63 mg Nebulization Q6H  . methylPREDNISolone (SOLU-MEDROL) injection  125 mg Intravenous Once  . methylPREDNISolone (SOLU-MEDROL) injection  40 mg Intravenous 3 times per day  . morphine  CONCENTRATE  5 mg Oral Q4H  . pantoprazole  40 mg Oral BID  . sodium chloride  3 mL Intravenous Q12H  . thiamine  100 mg Oral Daily  . tiotropium  18 mcg Inhalation Daily      ASSESSMENT AND PLAN:  1. Atrial Tachycardia - noted to initially have HR of 225 while in the ED. Started on Cardizem drip and HR improved to the 80's.  - Switched from Cardizem drip to Cardizem '60mg'$  PO Q8H on 01/26/2015. Patient's HR has been in the 120's -140's over the past 24 hours, with HR in the 80's at its lowest. Will increase to Cardizem '60mg'$  PO Q6H for better rate control. - Avoid BB due to concurrent lung disease.  2. History of CAD - s/p CABG 1995 with subsequent interventions (most recent 01/12/2013 with DES to SVG-RCA). - continue ASA and Zetia. No BB due to concurrent lung disease.  - currently under hospice care for end-stage COPD and non-small cell lung cancer Stage III.  Franchot Heidelberg , PA-C 8:46 AM 01/28/2015 Pager: [phone]

## 2015-01-28 NOTE — Discharge Summary (Signed)
Physician Discharge Summary  [name] BJY:[id number] DOB: [birth year] DOA: 01/25/2015  PCP: Whitney Ringer, MD  Admit date: 01/25/2015 Discharge date: 01/28/2015  Time spent: 35 minutes  Recommendations for Outpatient Follow-up:  1. Home with hospice 2. 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   Esophageal dysmotility   Esophageal stricture   Hypertension   Protein-calorie malnutrition, severe   CAD (coronary artery disease), autologous vein bypass graft, cath 01/12/13 with Xience DES to VG-RCA   Oropharyngeal candidiasis   Atrial tachycardia   COPD with acute exacerbation   Acute on chronic respiratory failure with hypoxia   Palliative care encounter   Discharge Condition: improved  Diet recommendation: regular  Filed Weights   01/25/15 0923 01/26/15 0410  Weight: 44.453 kg (98 lb) 42.411 kg (93 lb 8 oz)    History of present illness:  This is a 64 year old female patient with history of ischemic cardiomyopathy with most recent EF 45%, known CAD and CABG status post drug-eluting stent to vein graft to RCA, known stage III non-small cell lung cancer diagnosed in 2013 status post chemoradiation, end-stage COPD oxygen dependent, recurrent esophagitis in setting of known esophageal dysmotility and esophageal stricture and recurrent [name] ear is currently on Diflucan, continued tobacco abuse, ongoing depression and anxiety, dyslipidemia and severe protein calorie malnutrition. Patient was recently discharged on 8/19 after an admission for chest pain. Acute coronary syndrome was ruled out and cardiology recommended continuing current medical regimen. During that admission she had a associated COPD exacerbation and was treated with Solu-Medrol and Levaquin. On clinical exam she was also  found to have acute oropharyngeal candidiasis and was started on 2 week course of Diflucan. She was last evaluated by her oncologist on 6/27 with most recent CT scan on 6/23 demonstrating stability of spiculated right upper lobe nodule without disease progression. Patient returns today with acute onset of shortness of breath early this morning. Initially she thought this was more related to anxiety and contacted her hospice nurse who instructed her to take a Valium. Her symptoms did not improve. She also was having some chest pressure. EMS was called and she was found to have a tachycardic rhythm and subsequently transferred reported to the hospital.  Upon arrival to ER rhythm strip demonstrated now complex ventricular rhythm with a rate of 180 bpm. She's had no other changes in her respiratory symptoms according to the ER doctor and per minute history. After arrival to the ER patient's heart rate did improve to 105 bpm, BP was 132/89, O2 saturations were high percent on 4 L oxygen. Chest x-ray revealed underlying COPD without any focal changes. CT angiogram of the chest mistreated no pulmonary embolism, no focal infiltrates, no evidence of volume overload. Incidental finding of esophageal wall thickening as can be seen with esophagitis. She was found to have mild hyponatremia with a sodium of 134 and hypokalemia with potassium of 3.3, glucose was 102, BNP was slightly elevated at 634, troponin was 0.03, lactic acid was 0.83, CBC was normal except for mildly elevated neutrophils of 81%. Because her for increased heart rate patient was bolused with Cardizem and started on Cardizem infusion. She subsequently converted to sinus rhythm. While in the ER she began to have more dyspnea and increased work of breathing requiring her to sit upright  in the bed. she has been given several doses of morphine, she's been given oral as well as IV potassium, she has been given 1 nebulizer treatment with albuterol with minimal  improvement in her symptoms.  Upon my evaluation of the patient she was sitting upright in the bed with notable coarse expiratory wheezes on auscultation, she was complaining of difficulty breathing, she was subsequently given 125 mg of Solu-Medrol, Xopenex nebulizer was initiated. I returned 15 minutes later patient was sitting on the side of the bed but breathing much more easily. At this point more history was able to be obtained. Patient reports she continues to smoke "a little bit". Discussed with patient concerns her symptoms more likely related to end-stage COPD and introduced the concept of end-of-life care and beginning to transition our focus on comfort as opposed to aggressive care. Patient's daughter at bedside seemed to understand and process this concept better than the patient. Although patient is DNR, I'm not sure she is ready emotionally to accept that end-of-life is drawing closer. In addition to above, patient reports as having same chronic productive cough with yellow sputum but no fevers, chills, nausea vomiting or diarrhea. She continues to lose weight.  Hospital Course:  Acute on chronic respiratory failure with hypoxia/hypercarbia secondary to acute on end-stage COPD exacerbation plus underlying stage IIIB squamous cell carcinoma of the right lung: Improving with nebulizers, will taper down steroids, Roxanol appears to decrease work of breathing well keep patient on 3 L   Anxiety state: Stable with medications  Depression: Stable continue home medication   Esophageal dysmotility  Esophageal stricture  Hypertension  Protein-calorie malnutrition, severe: Patient meets criteria for severe malnutrition in the context of chronic illness, plus underweight. On ensure enlive twice a day. Appreciate nutrition assistance.   Oropharyngeal candidiasis: On Diflucan  Atrial tachycardia, multifocal: Responded to IV Cardizem which has been changed to by mouth.   Palliative care  encounter   Consultants: Cardiology Palliative medicine  Discharge Exam: Filed Vitals:   01/28/15 1148  BP: 135/77  Pulse: 105  Temp: 97.3 F (36.3 C)  Resp: 20    General: chronically ill appearing   Discharge Instructions   Discharge Instructions    Diet general    Complete by:  As directed      Discharge instructions    Complete by:  As directed   Hospice Elevate feet     Increase activity slowly    Complete by:  As directed           Current Discharge Medication List    START taking these medications   Details  diltiazem (CARDIZEM) 10 mg/ml oral suspension Take 6 mLs (60 mg total) by mouth every 6 (six) hours. Qty: 1000 mL, Refills: 0    !! feeding supplement (BOOST / RESOURCE BREEZE) LIQD Take 1 Container by mouth 3 (three) times daily between meals. Qty: 90 Container, Refills: 0    !! feeding supplement, ENSURE ENLIVE, (ENSURE ENLIVE) LIQD Take 237 mLs by mouth 3 (three) times daily between meals. Qty: 237 mL, Refills: 12    HYDROcodone-homatropine (HYCODAN) 5-1.5 MG/5ML syrup Take 5 mLs by mouth every 6 (six) hours as needed for cough. Qty: 120 mL, Refills: 0    !! Morphine Sulfate (MORPHINE CONCENTRATE) 10 MG/0.5ML SOLN concentrated solution Take 0.25 mLs (5 mg total) by mouth every 4 (four) hours. Qty: 120 mL, Refills: 0    !! Morphine Sulfate (MORPHINE CONCENTRATE) 10 MG/0.5ML SOLN concentrated solution Take 0.13 mLs (2.6 mg  total) by mouth every 2 (two) hours as needed for shortness of breath. Qty: 30 mL, Refills: 0    thiamine 100 MG tablet Take 1 tablet (100 mg total) by mouth daily. Qty: 30 tablet, Refills: 0     !! - Potential duplicate medications found. Please discuss with provider.    CONTINUE these medications which have CHANGED   Details  diazepam (VALIUM) 2 MG tablet Take 1 tablet (2 mg total) by mouth every 4 (four) hours as needed for anxiety. Qty: 30 tablet, Refills: 0    predniSONE (DELTASONE) 10 MG tablet 40 mg x 5 days, 30  mg x 5 days, 20 mg x 5 days, 10 mg x 5 days, 5 mg x 4 days then d/c Qty: 52 tablet, Refills: 0      CONTINUE these medications which have NOT CHANGED   Details  albuterol (PROAIR HFA) 108 (90 BASE) MCG/ACT inhaler Inhale 1-2 puffs into the lungs every 6 (six) hours as needed for wheezing or shortness of breath. Qty: 8.5 Inhaler, Refills: 3    albuterol (PROVENTIL) (2.5 MG/3ML) 0.083% nebulizer solution Take 3 mLs (2.5 mg total) by nebulization every 6 (six) hours as needed for wheezing or shortness of breath. Qty: 120 vial, Refills: 5    aspirin EC 81 MG tablet Take 81 mg by mouth daily.    benzonatate (TESSALON) 100 MG capsule Take 100 mg by mouth 3 (three) times daily as needed for cough.  Refills: 3    [employer] ARTHRITIS PAIN RELIEF 650 MG CR tablet Take 650 mg by mouth every 8 (eight) hours as needed for pain.  Refills: 0    famotidine (PEPCID) 20 MG tablet Take 1 tablet (20 mg total) by mouth at bedtime. Qty: 30 tablet, Refills: 4    fluconazole (DIFLUCAN) 100 MG tablet Take 1 tablet (100 mg total) by mouth daily. Qty: 14 tablet, Refills: 0    Fluticasone Furoate-Vilanterol (BREO ELLIPTA) 100-25 MCG/INH AEPB Inhale 1 puff into the lungs 2 (two) times daily.    MUCINEX 600 MG 12 hr tablet Take 1,200 mg by mouth every 12 (twelve) hours as needed for cough.  Refills: 0    OXYGEN-HELIUM IN Inhale 4 L into the lungs continuous.    pantoprazole (PROTONIX) 40 MG tablet Take 1 tablet (40 mg total) by mouth 2 (two) times daily. Qty: 60 tablet, Refills: 0    SPIRIVA HANDIHALER 18 MCG inhalation capsule PLACE 1 CAPSULE (18 MCG TOTAL) INTO INHALER AND INHALE DAILY. Qty: 30 capsule, Refills: 6    ZETIA 10 MG tablet TAKE 1 TABLET BY MOUTH EVERY DAY Qty: 30 tablet, Refills: 11      STOP taking these medications     atorvastatin (LIPITOR) 80 MG tablet      HYDROcodone-acetaminophen (NORCO) 10-325 MG per tablet      levofloxacin (LEVAQUIN) 500 MG tablet      loratadine (CLARITIN) 10  MG tablet      nitroGLYCERIN (NITROSTAT) 0.4 MG SL tablet      omeprazole (PRILOSEC) 20 MG capsule      ondansetron (ZOFRAN) 4 MG tablet      oxyCODONE (ROXICODONE) 15 MG immediate release tablet      SYMBICORT 160-4.5 MCG/ACT inhaler        Allergies  Allergen Reactions  . Amiodarone Shortness Of Breath and Other (See Comments)    Drops HR too low, fatigue  . Clopidogrel Bisulfate Hives  . Dilaudid [Hydromorphone Hcl] Other (See Comments)    "I couldn't  function for 2 days." lethargy  . Multaq [Dronedarone] Other (See Comments)    Heart races   . Tape Itching and Rash    Tears skin.  Please use "paper" tape      The results of significant diagnostics from this hospitalization (including imaging, microbiology, ancillary and laboratory) are listed below for reference.    Significant Diagnostic Studies: Dg Chest 2 View  01/09/2015   CLINICAL DATA:  Shortness of breath, COPD coronary artery disease.  EXAM: CHEST  2 VIEW  COMPARISON:  PA and lateral chest x-ray of January 08, 2015  FINDINGS: The lungs remain hyperinflated and clear. The heart is normal in size. The pulmonary vascularity is not engorged. There is no significant pleural effusion and there is no pneumothorax. There is 7 intact sternal wires. There are posterior spinal fusion device is present in the lower thoracic and lumbar spine.  IMPRESSION: COPD. There is no CHF, pneumonia, nor other acute cardiopulmonary abnormality.   Electronically Signed   By: David  Martinique M.D.   On: 01/09/2015 07:31   Dg Chest 2 View  01/08/2015   CLINICAL DATA:  Shortness of breath.  Weakness today.  EXAM: CHEST  2 VIEW  COMPARISON:  09/27/2014.  Chest CT 11/14/2014  FINDINGS: There is hyperinflation of the lungs compatible with COPD. Heart and mediastinal contours are within normal limits. No focal opacities or effusions. No acute bony abnormality.  IMPRESSION: COPD.  No active disease.   Electronically Signed   By: Rolm Baptise M.D.   On:  01/08/2015 10:35   Ct Angio Chest Pe W/cm &/or Wo Cm  01/25/2015   CLINICAL DATA:  Shortness of breath.  COPD.  EXAM: CT ANGIOGRAPHY CHEST WITH CONTRAST  TECHNIQUE: Multidetector CT imaging of the chest was performed using the standard protocol during bolus administration of intravenous contrast. Multiplanar CT image reconstructions and MIPs were obtained to evaluate the vascular anatomy.  CONTRAST:  69m OMNIPAQUE IOHEXOL 350 MG/ML SOLN  COMPARISON:  10/05/2013, 06/19/2014, 08/04/2012  FINDINGS: There is adequate opacification of the pulmonary arteries. There is no pulmonary embolus. The main pulmonary artery, right main pulmonary artery and left main pulmonary arteries are normal in size. The heart size is normal. There is no pericardial effusion. Normal caliber thoracic aorta. Scratched There is evidence of prior CABG. There is a patent left subclavian artery stents.  There is bilateral centrilobular emphysema. Stable spiculated nodule in the right upper lobe which is unchanged compared with the prior exam measuring 10 x 8 mm. Extending from the spiculated nodule is a subpleural soft tissue density along the right major fissure measuring 18 x 12 mm, unchanged compared with the prior exam of 10/05/2013. There is mild left basilar scarring. There is no focal consolidation, pleural effusion or pneumothorax.  There is esophageal wall thickening which may reflect esophagitis.  There is no axillary, hilar, or mediastinal adenopathy.  There is no lytic or blastic osseous lesion. Partially visualized is posterior thoracolumbar fusion. There is a chronic T10 vertebral body compression fracture.  The visualized portions of the upper abdomen are unremarkable.  Review of the MIP images confirms the above findings.  IMPRESSION: 1. No evidence of pulmonary embolus. 2. Esophageal wall thickening as can be seen with esophagitis. 3. Stable spiculated nodule and subpleural soft tissue density along the right major fissure.    Electronically Signed   By: HKathreen Devoid  On: 01/25/2015 11:39   Dg Chest Port 1 View  01/25/2015   CLINICAL DATA:  64 year old female with shortness of breath. Irregular heart rate. Supraventricular tachycardia. COPD on oxygen.  EXAM: PORTABLE CHEST - 1 VIEW  COMPARISON:  Multiple priors, most recently chest x-ray 01/09/2015.  FINDINGS: Lungs appear hyperexpanded with mild emphysematous changes. Chronic scarring in the medial aspect of the right upper lobe contributing to opacity in the right paratracheal region, with chronic elevation of the right hemidiaphragm, with chronic elevation of the minor fissure, similar prior examinations. Left lung appears clear. Lung bases are not incompletely visualized. No large pleural effusions (small pleural effusions are not excluded secondary to incomplete visualization of the lower thorax). Mild diffuse peribronchial cuffing, similar to prior examinations (chronic). No evidence of pulmonary edema. Heart size is upper limits of normal, likely accentuated by portable AP technique and lordotic positioning. Atherosclerosis in the thoracic aorta. Status post median sternotomy for CABG.  IMPRESSION: 1. Chronic changes in the lungs, similar prior studies, as above. No definite radiographic evidence of acute cardiopulmonary disease. 2. Mild diffuse peribronchial cuffing and emphysematous changes; imaging findings compatible with the reported clinical history of underlying COPD. 3. Atherosclerosis.   Electronically Signed   By: Vinnie Langton M.D.   On: 01/25/2015 09:58    Microbiology: No results found for this or any previous visit (from the past 240 hour(s)).   Labs: Basic Metabolic Panel:  Recent Labs Lab 01/25/15 1005 01/25/15 1517  NA 134*  --   K 3.3*  --   CL 92*  --   CO2 35*  --   GLUCOSE 102*  --   BUN 7  --   CREATININE 0.53  --   CALCIUM 8.7*  --   MG  --  1.7  PHOS  --  3.7   Liver Function Tests: No results for input(s): AST, ALT, ALKPHOS,  BILITOT, PROT, ALBUMIN in the last 168 hours. No results for input(s): LIPASE, AMYLASE in the last 168 hours. No results for input(s): AMMONIA in the last 168 hours. CBC:  Recent Labs Lab 01/25/15 1005  WBC 7.1  NEUTROABS 5.8  HGB 13.2  HCT 39.6  MCV 94.1  PLT 165   Cardiac Enzymes:  Recent Labs Lab 01/25/15 1005  TROPONINI 0.03   BNP: BNP (last 3 results)  Recent Labs  09/27/14 1323 01/08/15 0918 01/25/15 1006  BNP 199.1* 261.2* 634.8*    ProBNP (last 3 results)  Recent Labs  03/03/14 1147  PROBNP 475.8*    CBG: No results for input(s): GLUCAP in the last 168 hours.     SignedEulogio Bear  [facility] 01/28/2015, 12:27 PM

## 2015-02-16 ENCOUNTER — Other Ambulatory Visit: Payer: Self-pay | Admitting: Pulmonary Disease

## 2015-02-25 ENCOUNTER — Inpatient Hospital Stay (HOSPITAL_COMMUNITY)
Admission: EM | Admit: 2015-02-25 | Discharge: 2015-02-27 | DRG: 189 | Disposition: A | Discharge status: Hospice | Attending: Internal Medicine | Admitting: Internal Medicine

## 2015-02-25 ENCOUNTER — Emergency Department (HOSPITAL_COMMUNITY)

## 2015-02-25 ENCOUNTER — Encounter (HOSPITAL_COMMUNITY): Payer: Self-pay | Admitting: Emergency Medicine

## 2015-02-25 DIAGNOSIS — T501X5A Adverse effect of loop [high-ceiling] diuretics, initial encounter: Secondary | ICD-10-CM | POA: Diagnosis present

## 2015-02-25 DIAGNOSIS — I4892 Unspecified atrial flutter: Secondary | ICD-10-CM | POA: Diagnosis present

## 2015-02-25 DIAGNOSIS — Z7982 Long term (current) use of aspirin: Secondary | ICD-10-CM | POA: Diagnosis not present

## 2015-02-25 DIAGNOSIS — R0600 Dyspnea, unspecified: Secondary | ICD-10-CM | POA: Diagnosis present

## 2015-02-25 DIAGNOSIS — I255 Ischemic cardiomyopathy: Secondary | ICD-10-CM | POA: Diagnosis present

## 2015-02-25 DIAGNOSIS — E871 Hypo-osmolality and hyponatremia: Secondary | ICD-10-CM | POA: Diagnosis present

## 2015-02-25 DIAGNOSIS — I4891 Unspecified atrial fibrillation: Secondary | ICD-10-CM | POA: Diagnosis present

## 2015-02-25 DIAGNOSIS — I509 Heart failure, unspecified: Secondary | ICD-10-CM | POA: Diagnosis present

## 2015-02-25 DIAGNOSIS — Z681 Body mass index (BMI) 19 or less, adult: Secondary | ICD-10-CM

## 2015-02-25 DIAGNOSIS — E785 Hyperlipidemia, unspecified: Secondary | ICD-10-CM | POA: Diagnosis present

## 2015-02-25 DIAGNOSIS — K219 Gastro-esophageal reflux disease without esophagitis: Secondary | ICD-10-CM | POA: Diagnosis present

## 2015-02-25 DIAGNOSIS — Z955 Presence of coronary angioplasty implant and graft: Secondary | ICD-10-CM | POA: Diagnosis not present

## 2015-02-25 DIAGNOSIS — Z8673 Personal history of transient ischemic attack (TIA), and cerebral infarction without residual deficits: Secondary | ICD-10-CM | POA: Diagnosis not present

## 2015-02-25 DIAGNOSIS — I110 Hypertensive heart disease with heart failure: Secondary | ICD-10-CM | POA: Diagnosis present

## 2015-02-25 DIAGNOSIS — F419 Anxiety disorder, unspecified: Secondary | ICD-10-CM | POA: Diagnosis present

## 2015-02-25 DIAGNOSIS — Z79899 Other long term (current) drug therapy: Secondary | ICD-10-CM

## 2015-02-25 DIAGNOSIS — E43 Unspecified severe protein-calorie malnutrition: Secondary | ICD-10-CM | POA: Diagnosis present

## 2015-02-25 DIAGNOSIS — E222 Syndrome of inappropriate secretion of antidiuretic hormone: Secondary | ICD-10-CM | POA: Diagnosis present

## 2015-02-25 DIAGNOSIS — Z801 Family history of malignant neoplasm of trachea, bronchus and lung: Secondary | ICD-10-CM | POA: Diagnosis not present

## 2015-02-25 DIAGNOSIS — I471 Supraventricular tachycardia: Secondary | ICD-10-CM | POA: Diagnosis present

## 2015-02-25 DIAGNOSIS — Z923 Personal history of irradiation: Secondary | ICD-10-CM | POA: Diagnosis not present

## 2015-02-25 DIAGNOSIS — Z85118 Personal history of other malignant neoplasm of bronchus and lung: Secondary | ICD-10-CM

## 2015-02-25 DIAGNOSIS — J9621 Acute and chronic respiratory failure with hypoxia: Principal | ICD-10-CM | POA: Diagnosis present

## 2015-02-25 DIAGNOSIS — F329 Major depressive disorder, single episode, unspecified: Secondary | ICD-10-CM | POA: Diagnosis present

## 2015-02-25 DIAGNOSIS — F17210 Nicotine dependence, cigarettes, uncomplicated: Secondary | ICD-10-CM | POA: Diagnosis present

## 2015-02-25 DIAGNOSIS — E875 Hyperkalemia: Secondary | ICD-10-CM

## 2015-02-25 DIAGNOSIS — Z66 Do not resuscitate: Secondary | ICD-10-CM | POA: Diagnosis present

## 2015-02-25 DIAGNOSIS — K5900 Constipation, unspecified: Secondary | ICD-10-CM | POA: Diagnosis present

## 2015-02-25 DIAGNOSIS — Z515 Encounter for palliative care: Secondary | ICD-10-CM | POA: Insufficient documentation

## 2015-02-25 DIAGNOSIS — I739 Peripheral vascular disease, unspecified: Secondary | ICD-10-CM | POA: Diagnosis present

## 2015-02-25 DIAGNOSIS — R079 Chest pain, unspecified: Secondary | ICD-10-CM | POA: Diagnosis not present

## 2015-02-25 DIAGNOSIS — G629 Polyneuropathy, unspecified: Secondary | ICD-10-CM | POA: Diagnosis present

## 2015-02-25 DIAGNOSIS — Z951 Presence of aortocoronary bypass graft: Secondary | ICD-10-CM | POA: Diagnosis not present

## 2015-02-25 DIAGNOSIS — J449 Chronic obstructive pulmonary disease, unspecified: Secondary | ICD-10-CM | POA: Diagnosis present

## 2015-02-25 DIAGNOSIS — I2510 Atherosclerotic heart disease of native coronary artery without angina pectoris: Secondary | ICD-10-CM | POA: Diagnosis present

## 2015-02-25 DIAGNOSIS — Z9981 Dependence on supplemental oxygen: Secondary | ICD-10-CM

## 2015-02-25 DIAGNOSIS — I252 Old myocardial infarction: Secondary | ICD-10-CM | POA: Diagnosis not present

## 2015-02-25 DIAGNOSIS — R531 Weakness: Secondary | ICD-10-CM

## 2015-02-25 LAB — CBC
HCT: 35.5 % — ABNORMAL LOW (ref 36.0–46.0)
HEMOGLOBIN: 12.1 g/dL (ref 12.0–15.0)
MCH: 30.9 pg (ref 26.0–34.0)
MCHC: 34.1 g/dL (ref 30.0–36.0)
MCV: 90.8 fL (ref 78.0–100.0)
Platelets: 177 10*3/uL (ref 150–400)
RBC: 3.91 MIL/uL (ref 3.87–5.11)
RDW: 13.7 % (ref 11.5–15.5)
WBC: 8.8 10*3/uL (ref 4.0–10.5)

## 2015-02-25 LAB — BASIC METABOLIC PANEL
ANION GAP: 11 (ref 5–15)
BUN: 15 mg/dL (ref 6–20)
CALCIUM: 9.6 mg/dL (ref 8.9–10.3)
CO2: 35 mmol/L — AB (ref 22–32)
Chloride: 76 mmol/L — ABNORMAL LOW (ref 101–111)
Creatinine, Ser: 0.61 mg/dL (ref 0.44–1.00)
Glucose, Bld: 117 mg/dL — ABNORMAL HIGH (ref 65–99)
Potassium: 5.7 mmol/L — ABNORMAL HIGH (ref 3.5–5.1)
SODIUM: 122 mmol/L — AB (ref 135–145)

## 2015-02-25 LAB — BRAIN NATRIURETIC PEPTIDE: B NATRIURETIC PEPTIDE 5: 1406.6 pg/mL — AB (ref 0.0–100.0)

## 2015-02-25 LAB — TROPONIN I: TROPONIN I: 0.14 ng/mL — AB (ref <0.031)

## 2015-02-25 MED ORDER — ONDANSETRON HCL 4 MG/2ML IJ SOLN: 4.0000 mg | Freq: Four times a day (QID) | INTRAMUSCULAR | Status: DC | PRN

## 2015-02-25 MED ORDER — BENZONATATE 100 MG PO CAPS
100.0000 mg | ORAL_CAPSULE | Freq: Three times a day (TID) | ORAL | Status: DC | PRN
  Administered 2015-02-26: 100 mg via ORAL
  Filled 2015-02-25 (×2): qty 1

## 2015-02-25 MED ORDER — DILTIAZEM HCL 30 MG PO TABS
15.0000 mg | ORAL_TABLET | Freq: Four times a day (QID) | ORAL | Status: DC | PRN
  Administered 2015-02-25: 15 mg via ORAL
  Filled 2015-02-25: qty 1

## 2015-02-25 MED ORDER — BOOST / RESOURCE BREEZE PO LIQD: 1.0000 | Freq: Three times a day (TID) | ORAL | Status: DC

## 2015-02-25 MED ORDER — MORPHINE SULFATE (CONCENTRATE) 10 MG/0.5ML PO SOLN
10.0000 mg | ORAL | Status: DC | PRN
  Administered 2015-02-25 – 2015-02-26 (×4): 10 mg via SUBLINGUAL
  Filled 2015-02-25 (×4): qty 0.5

## 2015-02-25 MED ORDER — POLYETHYLENE GLYCOL 3350 17 GM/SCOOP PO POWD
17.0000 g | Freq: Every day | ORAL | Status: DC
  Filled 2015-02-25: qty 255

## 2015-02-25 MED ORDER — PANTOPRAZOLE SODIUM 40 MG PO TBEC
40.0000 mg | DELAYED_RELEASE_TABLET | Freq: Every day | ORAL | Status: DC
  Administered 2015-02-25 – 2015-02-27 (×3): 40 mg via ORAL
  Filled 2015-02-25 (×3): qty 1

## 2015-02-25 MED ORDER — METHYLPREDNISOLONE SODIUM SUCC 125 MG IJ SOLR
80.0000 mg | Freq: Once | INTRAMUSCULAR | Status: AC
  Administered 2015-02-25: 80 mg via INTRAVENOUS
  Filled 2015-02-25: qty 2

## 2015-02-25 MED ORDER — SODIUM CHLORIDE 0.9 % IV SOLN
INTRAVENOUS | Status: DC
  Administered 2015-02-25 – 2015-02-26 (×2): via INTRAVENOUS

## 2015-02-25 MED ORDER — ACETAMINOPHEN 325 MG PO TABS: 650.0000 mg | ORAL_TABLET | Freq: Four times a day (QID) | ORAL | Status: DC | PRN

## 2015-02-25 MED ORDER — ENOXAPARIN SODIUM 30 MG/0.3ML ~~LOC~~ SOLN
20.0000 mg | Freq: Every day | SUBCUTANEOUS | Status: DC
  Administered 2015-02-25 – 2015-02-26 (×2): 20 mg via SUBCUTANEOUS
  Filled 2015-02-25: qty 0.3

## 2015-02-25 MED ORDER — ENOXAPARIN SODIUM 40 MG/0.4ML ~~LOC~~ SOLN
40.0000 mg | SUBCUTANEOUS | Status: DC
  Filled 2015-02-25: qty 0.4

## 2015-02-25 MED ORDER — DILTIAZEM HCL ER COATED BEADS 240 MG PO CP24
240.0000 mg | ORAL_CAPSULE | Freq: Every day | ORAL | Status: DC
  Administered 2015-02-25 – 2015-02-27 (×3): 240 mg via ORAL
  Filled 2015-02-25 (×3): qty 1

## 2015-02-25 MED ORDER — LORAZEPAM 2 MG/ML IJ SOLN: 1.0000 mg | INTRAMUSCULAR | Status: DC | PRN

## 2015-02-25 MED ORDER — ACETAMINOPHEN 650 MG RE SUPP: 650.0000 mg | Freq: Four times a day (QID) | RECTAL | Status: DC | PRN

## 2015-02-25 MED ORDER — MORPHINE SULFATE (PF) 2 MG/ML IV SOLN
2.0000 mg | INTRAVENOUS | Status: DC | PRN
  Administered 2015-02-26: 2 mg via INTRAVENOUS
  Filled 2015-02-25: qty 1

## 2015-02-25 MED ORDER — DILTIAZEM 12 MG/ML ORAL SUSPENSION
60.0000 mg | Freq: Four times a day (QID) | ORAL | Status: DC
  Filled 2015-02-25 (×3): qty 6

## 2015-02-25 MED ORDER — IPRATROPIUM BROMIDE 0.02 % IN SOLN
0.5000 mg | Freq: Once | RESPIRATORY_TRACT | Status: AC
  Administered 2015-02-25: 0.5 mg via RESPIRATORY_TRACT
  Filled 2015-02-25: qty 2.5

## 2015-02-25 MED ORDER — SODIUM CHLORIDE 0.9 % IV BOLUS (SEPSIS)
500.0000 mL | Freq: Once | INTRAVENOUS | Status: AC
  Administered 2015-02-25: 500 mL via INTRAVENOUS

## 2015-02-25 MED ORDER — LEVALBUTEROL HCL 1.25 MG/0.5ML IN NEBU
1.2500 mg | INHALATION_SOLUTION | Freq: Four times a day (QID) | RESPIRATORY_TRACT | Status: DC | PRN
  Administered 2015-02-25 – 2015-02-26 (×2): 1.25 mg via RESPIRATORY_TRACT
  Filled 2015-02-25 (×3): qty 0.5

## 2015-02-25 MED ORDER — POLYETHYLENE GLYCOL 3350 17 G PO PACK
17.0000 g | PACK | Freq: Every day | ORAL | Status: DC
  Administered 2015-02-26 – 2015-02-27 (×2): 17 g via ORAL
  Filled 2015-02-25 (×2): qty 1

## 2015-02-25 MED ORDER — ASPIRIN EC 81 MG PO TBEC
81.0000 mg | DELAYED_RELEASE_TABLET | Freq: Every day | ORAL | Status: DC
  Administered 2015-02-26: 81 mg via ORAL
  Filled 2015-02-25: qty 1

## 2015-02-25 MED ORDER — SODIUM CHLORIDE 0.9 % IJ SOLN
3.0000 mL | Freq: Two times a day (BID) | INTRAMUSCULAR | Status: DC
  Administered 2015-02-26: 3 mL via INTRAVENOUS

## 2015-02-25 MED ORDER — DIAZEPAM 5 MG/ML IJ SOLN
2.5000 mg | Freq: Once | INTRAMUSCULAR | Status: AC
  Administered 2015-02-25: 2.5 mg via INTRAVENOUS
  Filled 2015-02-25: qty 2

## 2015-02-25 MED ORDER — PANTOPRAZOLE SODIUM 40 MG PO TBEC: 40.0000 mg | DELAYED_RELEASE_TABLET | Freq: Two times a day (BID) | ORAL | Status: DC

## 2015-02-25 MED ORDER — ONDANSETRON HCL 4 MG PO TABS: 4.0000 mg | ORAL_TABLET | Freq: Four times a day (QID) | ORAL | Status: DC | PRN

## 2015-02-25 MED ORDER — DILTIAZEM HCL 25 MG/5ML IV SOLN: 10.0000 mg | Freq: Four times a day (QID) | INTRAVENOUS | Status: DC | PRN

## 2015-02-25 MED ORDER — ALBUTEROL SULFATE (2.5 MG/3ML) 0.083% IN NEBU
5.0000 mg | INHALATION_SOLUTION | Freq: Once | RESPIRATORY_TRACT | Status: AC
  Administered 2015-02-25: 5 mg via RESPIRATORY_TRACT
  Filled 2015-02-25: qty 6

## 2015-02-25 MED ORDER — METHYLPREDNISOLONE SODIUM SUCC 125 MG IJ SOLR
60.0000 mg | Freq: Four times a day (QID) | INTRAMUSCULAR | Status: DC
  Administered 2015-02-25 – 2015-02-27 (×7): 60 mg via INTRAVENOUS
  Filled 2015-02-25 (×8): qty 2

## 2015-02-25 MED ORDER — GUAIFENESIN ER 600 MG PO TB12
1200.0000 mg | ORAL_TABLET | Freq: Two times a day (BID) | ORAL | Status: DC | PRN
Start: 2015-02-25 — End: 2015-02-27

## 2015-02-25 NOTE — H&P (Signed)
[facility] History and Physical  CLOTILE WHITTINGTON NTZ:[id number] DOB: [birth year] DOA: 02/25/2015  Referring physician:    Emergency Department PCP: Tivis Ringer, MD   Chief Complaint: worsening dyspnea    HPI: [name] is a 64 y.o. female with history of lung cancer, end-stage COPD, on hospice. She was recently hospitalized with acute on chronic respiratory failure. Patient was in "comfort zone" at [facility], now with home hospice.   Patient was brought to the emergency room department today for increased dyspnea. EMS called to home Sunday evening, breathing treatment given, patient refused ED evaluation. Last night she became more dyspneic. No fevers. She has a chronic cough, mainly non-productive. No chest pain. Patient now aving difficulty transferring from bed to chair at home .She complains of chronic constipation - strains to defecate. Some nausea, no vomiting  ED pertinent findings / treatment:    In the ED patient is tachypneic, tachycardic with heart rate in the 110-115. O2 saturation 100% on 4 L per [place]. Sodium low at 122, K+ 5.7,  renal function normal. BNP 1406, trop 0.14. White count normal.  Chest x-ray compatible with emphysema, no acute abnormalities.  ED consulted Palliative Care for goals of care.   Review of Systems:  Constitutional:  ++ weight loss. No night sweats, fevers, chills, fatigue.  HEENT:  No headaches, difficulty swallowing, tooth/dental problems, sore throat,  sneezing, itching, ear ache, nasal congestion, post nasal drip Cardio-vascular:  ++ BLE swelling.  No heartburn, indigestion, abdominal pain, nausea, vomiting, diarrhea, change in bowel habits, l++ loss of appetite  Resp:  No excess mucus, no productive cough, no coughing up of blood. No change in color of mucus.  No chest wall deformity  Skin:  no rash or lesions.  GU:  no dysuria, change in color of urine, urgency or frequency. No flank pain.  Musculoskeletal:  No joint  pain or swelling. No decreased range of motion. ++Chronic back pain Psych:  No change in mood or affect. No depression or anxiety. No memory loss.   Past Medical History  Diagnosis Date  . Acute myocardial infarction, unspecified site, episode of care unspecified 2011    VF arrest. Ruled in, cath with no clear culprit lesion, inferior WMA on LV gram, medical therapy  . Esophageal dysmotility   . Hemorrhoids   . Esophageal stricture   . Diaphragmatic hernia without mention of obstruction or gangrene   . 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 CAD (coronary artery disease)     CABG'96., Xience DES -VG-RCA (01/12/13)  . Peripheral neuropathy ([place])   . Hyperlipidemia   . Peptic ulcer, unspecified site, unspecified as acute or chronic, without mention of hemorrhage, perforation, or obstruction   . Depressive disorder, not elsewhere classified   . Anxiety state, unspecified   . COPD (chronic obstructive pulmonary disease) ([place])   . GERD (gastroesophageal reflux disease)   . Candida esophagitis ([place])     on multiple occasions  . C. difficile diarrhea   . PVD (peripheral vascular disease) ([place])   . Headache(784.0)   . Weight loss 06/13/2011    scheduled here by family doctor  . CHF (congestive heart failure) ([place]) 2011  . Asthma   . Hypertension   . History of nuclear stress test 07/14/2010    dipyriadmole; normal pattern of perfusion, low risk   . History of tobacco use     quit 05/2009  . Non-small cell carcinoma of lung, stage 3 (HCC)  Dr. Roxan Hockey  . History of radiation therapy 02/28/2012-04/14/2012    right lung  . Bilateral carotid artery disease ([place])   . TIA (transient ischemic attack)     amaurosis fugax  . Tubular adenoma of colon   . On home oxygen therapy     "4L; 24/7" (01/08/2015)  . Atrial fibrillation [facility])    Past Surgical History  Procedure Laterality Date  . Tonsillectomy  1974  . Mandible fracture surgery Left 1976  . Breast surgery  Bilateral 1969, 1970    "tumors"  . Epigastric hernia repair  2008    Dr Roxan Hockey  . Coronary artery bypass graft  02/01/1994    left internal mammary to LAD, vein graft to diagonal/post descending, post lateral, marginals, circumfle, diagonal  . Carpal tunnel release    . Laminectomy    . Subclavian vein angioplasty / stenting Left   . Transthoracic echocardiogram  08/14/2009    EF=>55%, normal LV systolic function, mild hypokinesis of basal segments of inferolateral & anterolater walls; mild MR/TR; AV mildly sclerotic  . Coronary angioplasty with stent placement  08/29/2000    stent to RCA (Dr. Corky Downs)  . Cardiac catheterization  01/17/2002    patent grafts (Dr. Marella Chimes)  . Cardiac catheterization  02/13/2004    significant L main & 3 vessel CAD, patents grafts, mild distal aortic disease (Dr. Gerrie Nordmann)  . Cardiac catheterization  07/14/2005    patent grafts, normal LV function (Dr. Adora Fridge)  . Cardiac catheterization  09/26/2008    patent grafts, normal LV function (Dr. Adora Fridge)  . Cardiac catheterization  05/31/2009    patent grafts, inferior wall motion abnormality (Dr. Adora Fridge)  . Cardiac catheterization  10/03/2009    normal LV function; 100% native RCA, circumflex, distal LAD occlusion  . Coronary angioplasty with stent placement  01/12/2013    Xience DES to SVG-RCA distal  . Carotid doppler  08/14/2009    R & L ICAs - 0-49% diameter reduction   . Left heart catheterization with coronary/graft angiogram N/A 01/12/2013    Procedure: LEFT HEART CATHETERIZATION WITH Beatrix Fetters;  Surgeon: Leonie Man, MD;  Location: [facility] CATH LAB;  Service: Cardiovascular;  Laterality: N/A;  . Vaginal hysterectomy  1998   Social History:  reports that she has been smoking Cigarettes.  She has a 45 pack-year smoking history. She has never used smokeless tobacco. She reports that she uses illicit drugs (Flunitrazepam). She reports that she does not drink alcohol.  Lives:  At  home With:  Alone but daughter and brother there on regular basis Assistive Devices:  Has a walker, doesn't use it  Allergies  Allergen Reactions  . Amiodarone Shortness Of Breath and Other (See Comments)    Drops HR too low, fatigue  . Clopidogrel Bisulfate Hives  . Dilaudid [Hydromorphone Hcl] Other (See Comments)    "I couldn't function for 2 days." lethargy  . Multaq [Dronedarone] Other (See Comments)    Heart races   . Tape Itching and Rash    Tears skin.  Please use "paper" tape    Family History  Problem Relation Age of Onset  . Lung cancer Father     also CVA  . Heart disease Father   . Asthma Father   . Allergies Father   . Cancer Father   . Heart disease Mother     also CVA, MI  . Brain cancer Mother   . Heart disease Sister   . Colon  cancer Neg Hx   . Lung cancer Brother 33    also ENT cancer  . Hypertension Brother 33    also aneurysm, early CAD  . Breast cancer Other     family hx    Prior to Admission medications   Medication Sig Start Date End Date Taking? Authorizing Provider  aspirin EC 81 MG tablet Take 81 mg by mouth daily.   Yes Historical Provider, MD  benzonatate (TESSALON) 100 MG capsule Take 100 mg by mouth 3 (three) times daily as needed for cough.  01/15/15  Yes Historical Provider, MD  [employer] ARTHRITIS PAIN RELIEF 650 MG CR tablet Take 650 mg by mouth every 8 (eight) hours as needed for pain.  01/15/15  Yes Historical Provider, MD  [employer] BISACODYL 5 MG EC tablet Take 10 mg by mouth daily as needed. 02/12/15  Yes Historical Provider, MD  diltiazem (DILACOR XR) 120 MG 24 hr capsule Take 120 mg by mouth daily.   Yes Historical Provider, MD  famotidine (PEPCID) 20 MG tablet Take 1 tablet (20 mg total) by mouth at bedtime. 12/11/14  Yes Lafayette Dragon, MD  feeding supplement, ENSURE ENLIVE, (ENSURE ENLIVE) LIQD Take 237 mLs by mouth 3 (three) times daily between meals. 01/28/15  Yes Geradine Girt, DO  fluconazole (DIFLUCAN) 100 MG tablet Take 1 tablet (100  mg total) by mouth daily. 01/10/15  Yes Kinnie Feil, MD  furosemide (LASIX) 20 MG tablet Take 20 mg by mouth daily.   Yes Historical Provider, MD  ipratropium-albuterol (DUONEB) 0.5-2.5 (3) MG/3ML SOLN Take 3 mLs by nebulization every 6 (six) hours as needed (shortness of breath).   Yes Historical Provider, MD  LORazepam (ATIVAN) 0.5 MG tablet Take 1 mg by mouth at bedtime.   Yes Historical Provider, MD  magic mouthwash SOLN Swish and swallow 5 mLs 4 (four) times daily as needed for mouth pain.   Yes Historical Provider, MD  morphine (ROXANOL) 20 MG/ML concentrated solution Take 10-20 mg by mouth every 2 (two) hours.   Yes Historical Provider, MD  MUCINEX 600 MG 12 hr tablet Take 1,200 mg by mouth every 12 (twelve) hours as needed for cough.  01/15/15  Yes Historical Provider, MD  omeprazole (PRILOSEC) 40 MG capsule Take 40 mg by mouth daily. 02/19/15  Yes Historical Provider, MD  ondansetron (ZOFRAN) 4 MG tablet Take 4 mg by mouth every 8 (eight) hours as needed for nausea or vomiting.  02/14/15  Yes Historical Provider, MD  OXYGEN-HELIUM IN Inhale 4 L into the lungs continuous.   Yes Historical Provider, MD  polyethylene glycol powder (GLYCOLAX/MIRALAX) powder Take 17 g by mouth daily.  02/12/15  Yes Historical Provider, MD  predniSONE (DELTASONE) 10 MG tablet 40 mg x 5 days, 30 mg x 5 days, 20 mg x 5 days, 10 mg x 5 days, 5 mg x 4 days then d/c Patient taking differently: Take 10 mg by mouth daily with breakfast.  01/28/15  Yes Geradine Girt, DO  SYMBICORT 160-4.5 MCG/ACT inhaler Inhale 2 puffs into the lungs every 12 (twelve) hours as needed (shortness of breath).  02/19/15  Yes Historical Provider, MD  ZETIA 10 MG tablet TAKE 1 TABLET BY MOUTH EVERY DAY 03/25/14  Yes Lorretta Harp, MD  albuterol (PROAIR HFA) 108 (90 BASE) MCG/ACT inhaler Inhale 1-2 puffs into the lungs every 6 (six) hours as needed for wheezing or shortness of breath. Patient not taking: Reported on 02/25/2015 01/10/15   Kinnie Feil,  MD  albuterol (PROVENTIL) (2.5 MG/3ML) 0.083% nebulizer solution Take 3 mLs (2.5 mg total) by nebulization every 6 (six) hours as needed for wheezing or shortness of breath. Patient not taking: Reported on 02/25/2015 01/10/15   Kinnie Feil, MD  diazepam (VALIUM) 2 MG tablet Take 1 tablet (2 mg total) by mouth every 4 (four) hours as needed for anxiety. Patient not taking: Reported on 02/25/2015 01/28/15   Geradine Girt, DO  diltiazem (CARDIZEM) 10 mg/ml oral suspension Take 6 mLs (60 mg total) by mouth every 6 (six) hours. Patient not taking: Reported on 02/25/2015 01/28/15   Geradine Girt, DO  feeding supplement (BOOST / RESOURCE BREEZE) LIQD Take 1 Container by mouth 3 (three) times daily between meals. Patient not taking: Reported on 02/25/2015 01/28/15   Geradine Girt, DO  HYDROcodone-homatropine [facility]) 5-1.5 MG/5ML syrup Take 5 mLs by mouth every 6 (six) hours as needed for cough. Patient not taking: Reported on 02/25/2015 01/28/15   Geradine Girt, DO  Morphine Sulfate (MORPHINE CONCENTRATE) 10 MG/0.5ML SOLN concentrated solution Take 0.25 mLs (5 mg total) by mouth every 4 (four) hours. Patient not taking: Reported on 02/25/2015 01/28/15   Geradine Girt, DO  Morphine Sulfate (MORPHINE CONCENTRATE) 10 MG/0.5ML SOLN concentrated solution Take 0.13 mLs (2.6 mg total) by mouth every 2 (two) hours as needed for shortness of breath. Patient not taking: Reported on 02/25/2015 01/28/15   Geradine Girt, DO  pantoprazole (PROTONIX) 40 MG tablet Take 1 tablet (40 mg total) by mouth 2 (two) times daily. Patient not taking: Reported on 02/25/2015 01/10/15   Kinnie Feil, MD  SPIRIVA HANDIHALER 18 MCG inhalation capsule PLACE 1 CAPSULE (18 MCG TOTAL) INTO INHALER AND INHALE DAILY. Patient not taking: Reported on 02/25/2015 10/28/14   Noralee Space, MD  thiamine 100 MG tablet Take 1 tablet (100 mg total) by mouth daily. Patient not taking: Reported on 02/25/2015 01/28/15   Geradine Girt, DO   Physical  Exam: Filed Vitals:   02/25/15 1316 02/25/15 1330 02/25/15 1445 02/25/15 1530  BP: 136/85 136/86 137/98 155/76  Pulse: 106 107 113   Temp:      TempSrc:      Resp: '20 22  24  '$ Height:      Weight:      SpO2: 100% 100% 100%     Wt Readings from Last 3 Encounters:  02/25/15 42.91 kg (94 lb 9.6 oz)  01/26/15 42.411 kg (93 lb 8 oz)  11/22/14 42.638 kg (94 lb)    General:  Pleasant emaciated white female calm having labored breathing.   Eyes: PERRL, normal lids, irises & conjunctiva.  ENT: grossly normal hearing, lips & tongue Neck: no LAD, masses  Cardiovascular: tachycardic, pitting edema of BLE  Respiratory: Wheezing in bilateral upper and lower lobes,labored breathing with use of accessory muscles.  Abdomen: soft, nontender, mildly distended and tympanitic. Bowel sounds present. No masses appreciated.  Skin: no rash. Scant bilateral upper extremity brusing Musculoskeletal: grossly normal tone BUE/BLE Psychiatric: grossly normal mood and affect, speech fluent and appropriate Neurologic: grossly non-focal.         Labs on Admission:  Basic Metabolic Panel:  Recent Labs Lab 02/25/15 1254  NA 122*  K 5.7*  CL 76*  CO2 35*  GLUCOSE 117*  BUN 15  CREATININE 0.61  CALCIUM 9.6   CBC:  Recent Labs Lab 02/25/15 1254  WBC 8.8  HGB 12.1  HCT 35.5*  MCV 90.8  PLT 177  Cardiac Enzymes:  Recent Labs Lab 02/25/15 1254  TROPONINI 0.14*    BNP (last 3 results)  Recent Labs  01/08/15 0918 01/25/15 1006 02/25/15 1254  BNP 261.2* 634.8* 1406.6*    ProBNP (last 3 results)  Recent Labs  03/03/14 1147  PROBNP 475.8*   Radiological Exams on Admission: Dg Chest Portable 1 View  02/25/2015   CLINICAL DATA:  Shortness of breath.  EXAM: PORTABLE CHEST 1 VIEW  COMPARISON:  CT scan and chest x-ray dated 01/25/2015 and chest x-rays dated 01/09/2015 and 06/23/2014  FINDINGS: The lungs are hyperinflated with flattening of the diaphragm. Slight scarring at the right  base, stable. Heart size and pulmonary vascularity are normal. No infiltrates or effusions. No pneumothorax. No acute osseous abnormality. Previous CABG.  IMPRESSION: Emphysema.  No acute abnormalities.   Electronically Signed   By: Lorriane Shire M.D.   On: 02/25/2015 13:37    EKG:  Atrial-paced complexes Ventricular premature complex Aberrant conduction of SV complex(es) Biatrial enlargement Left anterior fascicular block RSR in V1 or V2, probably normal variant LVH with secondary repolarization abnormality Artifact in lead(s) I V1 V6*   ASSESSMENT / PLAN:     Acute and chronic respiratory failure with hypoxia / end stage COPD on home Hospice. Patient presented to ED with worsening dyspnea since last night. No acute abnormalities on CXR.   Admit to telemetry Gentle hydration overnight with IVF as patient appears to be dehydrated.  Nebulizers, IV steroids Supplemental oxygen per RT. She has increased work of breathing. RT seeing now.  Continue morphine, anxiolytics Hold off on bipap for now as patient and daughter are leaning more toward a Palliative Care approach and understand that prognosis is poor. If no improvement in next 24-48 hours will transition to full comfort care.   CAD s/p CABG / ischemic cardiomyopathy, ischemic EF~45%. She has peripheral edema, BNP only 475. Will need to hold diuretics given hyponatremia.   Hyponatremia. Secondary to lasix or SIADH due to end stage lung disease. Recently started Lasix. Will hold lasix for now. Gentle hydration with IVF, oral fluid restriction to 1200 cc / day   Atrial flutter. Place on tele. Continue Cardizemm   Code Status: DNR DVT Prophylaxis: lovenox Family Communication: Patient alert, oriented and understands plan of care. Daughter at bedside.  Disposition Plan:  Discharge to home in 2-3 days.  Time spent: 60 minutes  Tye Savoy , NP [facility] Pager [phone]

## 2015-02-25 NOTE — ED Notes (Signed)
Pt from home- hospice pt with DNR. Pt started having increased SOB Sunday- pt was placed on CPAP by EMS then refused transport due to feeling better. Today pt feeling increased SOB pt placed on CPAP-- given 2 duonebs by EMS. Pt wears 4L [place] at home. Sats uppers 90's for EMS. Pt alert and oriented.  BP 147/81, HR 117 sinus tach, resp 28-30

## 2015-02-25 NOTE — ED Notes (Signed)
Pt on 4L [place] at this time, sats 100%

## 2015-02-25 NOTE — ED Notes (Signed)
Pt's family states pt needs her home dose of morphine which is '10mg'$  q 2hour. She last had it at 1045 this morning. Notified Dr. Ashok Cordia. Dr. Ashok Cordia ok with family giving home dose. Pt's dtr will administer '10mg'$  morphine from her home medication.

## 2015-02-25 NOTE — ED Provider Notes (Addendum)
CSN: [id number]     Arrival date & time 02/25/15  1233 History   First MD Initiated Contact with Patient 02/25/15 1246     Chief Complaint  Patient presents with  . Shortness of Breath     (Consider location/radiation/quality/duration/timing/severity/associated sxs/prior Treatment) Patient is a 64 y.o. female presenting with shortness of breath. The history is provided by the patient.  Shortness of Breath Associated symptoms: no abdominal pain, no chest pain, no cough, no fever, no headaches, no neck pain, no rash, no sore throat and no vomiting   Patient w hx end stage copd, cad, lung ca, currently followed by hospice care, from home with increased shortness of breath, and uncomfortable breathing in the past week.  Family also notes bilateral foot/ankle edema, for which patient was recently prescribed lasix.  Is on home o2 4 liters [place] continuously, and uses home nebs. Compliant w normal meds. Has liquid morphine for symptom relief. Family indicates pt is gradually becoming weaker, with poor po intake, and indicate they and pt feel she needs to be in the hospital.       Past Medical History  Diagnosis Date  . Acute myocardial infarction, unspecified site, episode of care unspecified 2011    VF arrest. Ruled in, cath with no clear culprit lesion, inferior WMA on LV gram, medical therapy  . Esophageal dysmotility   . Hemorrhoids   . Esophageal stricture   . Diaphragmatic hernia without mention of obstruction or gangrene   . 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 CAD (coronary artery disease)     CABG'96., Xience DES -VG-RCA (01/12/13)  . Peripheral neuropathy ([place])   . Hyperlipidemia   . Peptic ulcer, unspecified site, unspecified as acute or chronic, without mention of hemorrhage, perforation, or obstruction   . Depressive disorder, not elsewhere classified   . Anxiety state, unspecified   . COPD (chronic obstructive pulmonary disease) ([place])   . GERD (gastroesophageal  reflux disease)   . Candida esophagitis ([place])     on multiple occasions  . C. difficile diarrhea   . PVD (peripheral vascular disease) ([place])   . Headache(784.0)   . Weight loss 06/13/2011    scheduled here by family doctor  . CHF (congestive heart failure) ([place]) 2011  . Asthma   . Hypertension   . History of nuclear stress test 07/14/2010    dipyriadmole; normal pattern of perfusion, low risk   . History of tobacco use     quit 05/2009  . Non-small cell carcinoma of lung, stage 3 (HCC)     Dr. Roxan Hockey  . History of radiation therapy 02/28/2012-04/14/2012    right lung  . Bilateral carotid artery disease ([place])   . TIA (transient ischemic attack)     amaurosis fugax  . Tubular adenoma of colon   . On home oxygen therapy     "4L; 24/7" (01/08/2015)  . Atrial fibrillation [facility])    Past Surgical History  Procedure Laterality Date  . Tonsillectomy  1974  . Mandible fracture surgery Left 1976  . Breast surgery Bilateral 1969, 1970    "tumors"  . Epigastric hernia repair  2008    Dr Roxan Hockey  . Coronary artery bypass graft  02/01/1994    left internal mammary to LAD, vein graft to diagonal/post descending, post lateral, marginals, circumfle, diagonal  . Carpal tunnel release    . Laminectomy    . Subclavian vein angioplasty / stenting Left   . Transthoracic echocardiogram  08/14/2009  EF=>55%, normal LV systolic function, mild hypokinesis of basal segments of inferolateral & anterolater walls; mild MR/TR; AV mildly sclerotic  . Coronary angioplasty with stent placement  08/29/2000    stent to RCA (Dr. Corky Downs)  . Cardiac catheterization  01/17/2002    patent grafts (Dr. Marella Chimes)  . Cardiac catheterization  02/13/2004    significant L main & 3 vessel CAD, patents grafts, mild distal aortic disease (Dr. Gerrie Nordmann)  . Cardiac catheterization  07/14/2005    patent grafts, normal LV function (Dr. Adora Fridge)  . Cardiac catheterization  09/26/2008    patent grafts, normal LV  function (Dr. Adora Fridge)  . Cardiac catheterization  05/31/2009    patent grafts, inferior wall motion abnormality (Dr. Adora Fridge)  . Cardiac catheterization  10/03/2009    normal LV function; 100% native RCA, circumflex, distal LAD occlusion  . Coronary angioplasty with stent placement  01/12/2013    Xience DES to SVG-RCA distal  . Carotid doppler  08/14/2009    R & L ICAs - 0-49% diameter reduction   . Left heart catheterization with coronary/graft angiogram N/A 01/12/2013    Procedure: LEFT HEART CATHETERIZATION WITH Beatrix Fetters;  Surgeon: Leonie Man, MD;  Location: [facility] CATH LAB;  Service: Cardiovascular;  Laterality: N/A;  . Vaginal hysterectomy  1998   Family History  Problem Relation Age of Onset  . Lung cancer Father     also CVA  . Heart disease Father   . Asthma Father   . Allergies Father   . Cancer Father   . Heart disease Mother     also CVA, MI  . Brain cancer Mother   . Heart disease Sister   . Colon cancer Neg Hx   . Lung cancer Brother 98    also ENT cancer  . Hypertension Brother 71    also aneurysm, early CAD  . Breast cancer Other     family hx   Social History  Substance Use Topics  . Smoking status: Current Every Day Smoker -- 1.50 packs/day for 30 years    Types: Cigarettes  . Smokeless tobacco: Never Used  . Alcohol Use: No   OB History    No data available     Review of Systems  Constitutional: Negative for fever.  HENT: Negative for sore throat.   Eyes: Negative for redness.  Respiratory: Positive for shortness of breath. Negative for cough.   Cardiovascular: Positive for leg swelling. Negative for chest pain.  Gastrointestinal: Negative for vomiting and abdominal pain.  Genitourinary: Negative for flank pain.  Musculoskeletal: Negative for back pain and neck pain.  Skin: Negative for rash.  Neurological: Negative for headaches.  Hematological: Does not bruise/bleed easily.  Psychiatric/Behavioral: The patient is  nervous/anxious.       Allergies  Amiodarone; Clopidogrel bisulfate; Dilaudid; Multaq; and Tape  Home Medications   Prior to Admission medications   Medication Sig Start Date End Date Taking? Authorizing Provider  albuterol (PROAIR HFA) 108 (90 BASE) MCG/ACT inhaler Inhale 1-2 puffs into the lungs every 6 (six) hours as needed for wheezing or shortness of breath. 01/10/15   Kinnie Feil, MD  albuterol (PROVENTIL) (2.5 MG/3ML) 0.083% nebulizer solution Take 3 mLs (2.5 mg total) by nebulization every 6 (six) hours as needed for wheezing or shortness of breath. 01/10/15   Kinnie Feil, MD  aspirin EC 81 MG tablet Take 81 mg by mouth daily.    Historical Provider, MD  benzonatate (TESSALON) 100  MG capsule Take 100 mg by mouth 3 (three) times daily as needed for cough.  01/15/15   Historical Provider, MD  [employer] ARTHRITIS PAIN RELIEF 650 MG CR tablet Take 650 mg by mouth every 8 (eight) hours as needed for pain.  01/15/15   Historical Provider, MD  diazepam (VALIUM) 2 MG tablet Take 1 tablet (2 mg total) by mouth every 4 (four) hours as needed for anxiety. 01/28/15   Geradine Girt, DO  diltiazem (CARDIZEM) 10 mg/ml oral suspension Take 6 mLs (60 mg total) by mouth every 6 (six) hours. 01/28/15   Geradine Girt, DO  famotidine (PEPCID) 20 MG tablet Take 1 tablet (20 mg total) by mouth at bedtime. 12/11/14   Lafayette Dragon, MD  feeding supplement (BOOST / RESOURCE BREEZE) LIQD Take 1 Container by mouth 3 (three) times daily between meals. 01/28/15   Geradine Girt, DO  feeding supplement, ENSURE ENLIVE, (ENSURE ENLIVE) LIQD Take 237 mLs by mouth 3 (three) times daily between meals. 01/28/15   Geradine Girt, DO  fluconazole (DIFLUCAN) 100 MG tablet Take 1 tablet (100 mg total) by mouth daily. 01/10/15   Kinnie Feil, MD  Fluticasone Furoate-Vilanterol (BREO ELLIPTA) 100-25 MCG/INH AEPB Inhale 1 puff into the lungs 2 (two) times daily.    Historical Provider, MD  HYDROcodone-homatropine (HYCODAN) 5-1.5  MG/5ML syrup Take 5 mLs by mouth every 6 (six) hours as needed for cough. 01/28/15   Geradine Girt, DO  Morphine Sulfate (MORPHINE CONCENTRATE) 10 MG/0.5ML SOLN concentrated solution Take 0.25 mLs (5 mg total) by mouth every 4 (four) hours. 01/28/15   Geradine Girt, DO  Morphine Sulfate (MORPHINE CONCENTRATE) 10 MG/0.5ML SOLN concentrated solution Take 0.13 mLs (2.6 mg total) by mouth every 2 (two) hours as needed for shortness of breath. 01/28/15   Geradine Girt, DO  MUCINEX 600 MG 12 hr tablet Take 1,200 mg by mouth every 12 (twelve) hours as needed for cough.  01/15/15   Historical Provider, MD  OXYGEN-HELIUM IN Inhale 4 L into the lungs continuous.    Historical Provider, MD  pantoprazole (PROTONIX) 40 MG tablet Take 1 tablet (40 mg total) by mouth 2 (two) times daily. 01/10/15   Kinnie Feil, MD  predniSONE (DELTASONE) 10 MG tablet 40 mg x 5 days, 30 mg x 5 days, 20 mg x 5 days, 10 mg x 5 days, 5 mg x 4 days then d/c 01/28/15   Jessica U Vann, DO  SPIRIVA HANDIHALER 18 MCG inhalation capsule PLACE 1 CAPSULE (18 MCG TOTAL) INTO INHALER AND INHALE DAILY. 10/28/14   Noralee Space, MD  thiamine 100 MG tablet Take 1 tablet (100 mg total) by mouth daily. 01/28/15   Geradine Girt, DO  ZETIA 10 MG tablet TAKE 1 TABLET BY MOUTH EVERY DAY 03/25/14   Lorretta Harp, MD   BP 136/85 mmHg  Pulse 106  Temp(Src) 98 F (36.7 C) (Oral)  Resp 20  Ht '5\' 7"'$  (1.702 m)  Wt 94 lb 9.6 oz (42.91 kg)  BMI 14.81 kg/m2  SpO2 100% Physical Exam  Constitutional: She appears well-developed and well-nourished. No distress.  HENT:  Mouth/Throat: Oropharynx is clear and moist.  Eyes: Conjunctivae are normal. No scleral icterus.  Neck: Neck supple. No tracheal deviation present.  Cardiovascular: Normal rate, regular rhythm, normal heart sounds and intact distal pulses.   Pulmonary/Chest: She is in respiratory distress. She has wheezes.  Decreased bs bil  Abdominal: Soft. Normal appearance and  bowel sounds are normal. She  exhibits no distension. There is no tenderness.  Genitourinary:  No cva tenderness  Musculoskeletal:  Symmetric bilateral foot/ankle edema.   Neurological:  Slow to respond, arousable, does respond to questions, follows commands.   Skin: Skin is warm and dry. No rash noted. She is not diaphoretic.  Psychiatric:  Slow to respond, flat affect.   Nursing note and vitals reviewed.   ED Course  Procedures (including critical care time) Labs Review  Results for orders placed or performed during the hospital encounter of 78/29/56  Basic metabolic panel  Result Value Ref Range   Sodium 122 (L) 135 - 145 mmol/L   Potassium 5.7 (H) 3.5 - 5.1 mmol/L   Chloride 76 (L) 101 - 111 mmol/L   CO2 35 (H) 22 - 32 mmol/L   Glucose, Bld 117 (H) 65 - 99 mg/dL   BUN 15 6 - 20 mg/dL   Creatinine, Ser 0.61 0.44 - 1.00 mg/dL   Calcium 9.6 8.9 - 10.3 mg/dL   GFR calc non Af Amer >60 >60 mL/min   GFR calc Af Amer >60 >60 mL/min   Anion gap 11 5 - 15  CBC  Result Value Ref Range   WBC 8.8 4.0 - 10.5 K/uL   RBC 3.91 3.87 - 5.11 MIL/uL   Hemoglobin 12.1 12.0 - 15.0 g/dL   HCT 35.5 (L) 36.0 - 46.0 %   MCV 90.8 78.0 - 100.0 fL   MCH 30.9 26.0 - 34.0 pg   MCHC 34.1 30.0 - 36.0 g/dL   RDW 13.7 11.5 - 15.5 %   Platelets 177 150 - 400 K/uL   Dg Chest Portable 1 View  02/25/2015   CLINICAL DATA:  Shortness of breath.  EXAM: PORTABLE CHEST 1 VIEW  COMPARISON:  CT scan and chest x-ray dated 01/25/2015 and chest x-rays dated 01/09/2015 and 06/23/2014  FINDINGS: The lungs are hyperinflated with flattening of the diaphragm. Slight scarring at the right base, stable. Heart size and pulmonary vascularity are normal. No infiltrates or effusions. No pneumothorax. No acute osseous abnormality. Previous CABG.  IMPRESSION: Emphysema.  No acute abnormalities.   Electronically Signed   By: Lorriane Shire M.D.   On: 02/25/2015 13:37       I have personally reviewed and evaluated these images and lab results as part of  my medical decision-making.   EKG Interpretation   Date/Time:  Tuesday February 25 2015 12:37:27 EDT Ventricular Rate:  112 PR Interval:  155 QRS Duration: 88 QT Interval:  330 QTC Calculation: 450 R Axis:   -75 Text Interpretation:  Atrial-paced complexes Sinus tachycardia Biatrial  enlargement Left anterior fascicular block  LVH with secondary  repolarization abnormality Poor data quality Confirmed by Ashok Cordia  MD,  Lennette Bihari ([id number]) on 02/25/2015 1:19:19 PM      MDM   Iv ns. Monitor, pulse ox.    o2 4 liters [place].  Reviewed nursing notes and prior charts for additional history.   Albuterol and atrovent neb.  Pt/family goals of care are difficult to understand/inconsistent.  They indicate want comfort care, and remain with hospice care, and also state they want in hospital treatment of her various symptoms.  Palliative medicine consult called to assist w tx plan, clarify goals of care, and optimize patient comfort and support.  Na low, NS bolus iv. Alb neb tx. o2 [place].  Pt/fam do request tx/admit, Na is quite low and new from prior - will admit.  Pt requests anxiety med, states  takes med at home for anxiety.  Valium 2.5 mg iv.  Discussed w hospitalists -  Will admit.  On discussions w pt/fam, pt confirms her wishes for no intubation/no vent/no cpr. dnr order entered.       Lajean Saver, MD 02/25/15 [id number]

## 2015-02-25 NOTE — Progress Notes (Signed)
[facility] patient seen in the ED after being brought in by EMS for severe shortness of breath. Patient's grandson lives with her. When pt's daughter came to the house this morning she  called 911. [facility] RN offered to have pt. taken to [facility] for sx management but pt. and family declined. Pt. Is a DNR code status. Pt. on 3L  O2 per [place] with respiratory rate 22-25. Audible wheezing heard. Dtr. reports Dr. Radene Gunning started her today on Furosemide 20 mg PO that was given at 1030 am. Pt's last dose of Morphine sulfate concentrate given at 1045 am. Pt. Is reporting pain in her back and legs at this time. Pt's daughter, grandson and granddaughter were at the bedside. Pt. states she would like to go back to [facility] but would like to be admitted to the hospital for " swelling in my ankles and I need help with my breathing" first. [facility] hospital Liaison talked with pt's primary nurse and her [place]. Pt. agrees to see Palliative Care team and they were advised of this. Home medication list provided and staff RN, Hope advised of the above. [facility] will continue to follow daily if she is admitted. Please call with any questions. Please contact Ollen Gross LCSW with [facility] for any information needed related to pt. and family dynamics.  [facility] Liaison [phone]

## 2015-02-26 DIAGNOSIS — R0600 Dyspnea, unspecified: Secondary | ICD-10-CM | POA: Insufficient documentation

## 2015-02-26 DIAGNOSIS — E43 Unspecified severe protein-calorie malnutrition: Secondary | ICD-10-CM

## 2015-02-26 DIAGNOSIS — R079 Chest pain, unspecified: Secondary | ICD-10-CM

## 2015-02-26 DIAGNOSIS — J449 Chronic obstructive pulmonary disease, unspecified: Secondary | ICD-10-CM

## 2015-02-26 DIAGNOSIS — Z515 Encounter for palliative care: Secondary | ICD-10-CM | POA: Insufficient documentation

## 2015-02-26 DIAGNOSIS — I255 Ischemic cardiomyopathy: Secondary | ICD-10-CM

## 2015-02-26 DIAGNOSIS — J9621 Acute and chronic respiratory failure with hypoxia: Principal | ICD-10-CM

## 2015-02-26 LAB — BASIC METABOLIC PANEL
ANION GAP: 10 (ref 5–15)
BUN: 13 mg/dL (ref 6–20)
CO2: 31 mmol/L (ref 22–32)
Calcium: 8.8 mg/dL — ABNORMAL LOW (ref 8.9–10.3)
Chloride: 88 mmol/L — ABNORMAL LOW (ref 101–111)
Creatinine, Ser: 0.52 mg/dL (ref 0.44–1.00)
GLUCOSE: 106 mg/dL — AB (ref 65–99)
POTASSIUM: 4.9 mmol/L (ref 3.5–5.1)
SODIUM: 129 mmol/L — AB (ref 135–145)

## 2015-02-26 MED ORDER — DIPHENHYDRAMINE HCL 12.5 MG/5ML PO ELIX: 12.5000 mg | ORAL_SOLUTION | Freq: Four times a day (QID) | ORAL | Status: DC | PRN

## 2015-02-26 MED ORDER — METOPROLOL TARTRATE 1 MG/ML IV SOLN
5.0000 mg | Freq: Once | INTRAVENOUS | Status: AC
  Administered 2015-02-26: 5 mg via INTRAVENOUS
  Filled 2015-02-26: qty 5

## 2015-02-26 MED ORDER — MORPHINE BOLUS VIA INFUSION
2.0000 mg | INTRAVENOUS | Status: DC | PRN
  Filled 2015-02-26: qty 2

## 2015-02-26 MED ORDER — NITROGLYCERIN 0.4 MG SL SUBL
SUBLINGUAL_TABLET | SUBLINGUAL | Status: AC
  Administered 2015-02-26: 0.4 mg
  Filled 2015-02-26: qty 1

## 2015-02-26 MED ORDER — BISACODYL 10 MG RE SUPP: 10.0000 mg | Freq: Every day | RECTAL | Status: DC | PRN

## 2015-02-26 MED ORDER — NICOTINE 21 MG/24HR TD PT24
21.0000 mg | MEDICATED_PATCH | Freq: Every day | TRANSDERMAL | Status: DC
  Administered 2015-02-27: 21 mg via TRANSDERMAL
  Filled 2015-02-26 (×2): qty 1

## 2015-02-26 MED ORDER — NALOXONE HCL 0.4 MG/ML IJ SOLN: 0.4000 mg | INTRAMUSCULAR | Status: DC | PRN

## 2015-02-26 MED ORDER — MORPHINE SULFATE 1 MG/ML IV SOLN
INTRAVENOUS | Status: DC
  Filled 2015-02-26: qty 25

## 2015-02-26 MED ORDER — ONDANSETRON HCL 4 MG/2ML IJ SOLN: 4.0000 mg | Freq: Four times a day (QID) | INTRAMUSCULAR | Status: DC | PRN

## 2015-02-26 MED ORDER — ENSURE ENLIVE PO LIQD
237.0000 mL | Freq: Three times a day (TID) | ORAL | Status: DC
  Administered 2015-02-26 – 2015-02-27 (×2): 237 mL via ORAL

## 2015-02-26 MED ORDER — NITROGLYCERIN 2 % TD OINT
0.5000 [in_us] | TOPICAL_OINTMENT | Freq: Four times a day (QID) | TRANSDERMAL | Status: DC
  Administered 2015-02-26 – 2015-02-27 (×5): 0.5 [in_us] via TOPICAL
  Filled 2015-02-26: qty 30

## 2015-02-26 MED ORDER — LEVALBUTEROL HCL 1.25 MG/0.5ML IN NEBU
1.2500 mg | INHALATION_SOLUTION | RESPIRATORY_TRACT | Status: DC
  Administered 2015-02-26: 1.25 mg via RESPIRATORY_TRACT

## 2015-02-26 MED ORDER — MORPHINE SULFATE (PF) 2 MG/ML IV SOLN
2.0000 mg | INTRAVENOUS | Status: DC | PRN
  Administered 2015-02-26: 4 mg via INTRAVENOUS
  Administered 2015-02-26: 2 mg via INTRAVENOUS
  Administered 2015-02-27: 4 mg via INTRAVENOUS
  Filled 2015-02-26: qty 1
  Filled 2015-02-26 (×2): qty 2

## 2015-02-26 MED ORDER — METOPROLOL TARTRATE 1 MG/ML IV SOLN
5.0000 mg | Freq: Once | INTRAVENOUS | Status: DC
  Filled 2015-02-26: qty 5

## 2015-02-26 MED ORDER — LORAZEPAM 2 MG/ML IJ SOLN: 0.5000 mg | INTRAMUSCULAR | Status: DC | PRN

## 2015-02-26 MED ORDER — DIPHENHYDRAMINE HCL 50 MG/ML IJ SOLN: 12.5000 mg | Freq: Four times a day (QID) | INTRAMUSCULAR | Status: DC | PRN

## 2015-02-26 MED ORDER — DEXTROSE 5 % IV SOLN
2.0000 mg/h | INTRAVENOUS | Status: DC
  Administered 2015-02-26: 2 mg/h via INTRAVENOUS
  Filled 2015-02-26: qty 10

## 2015-02-26 MED ORDER — LEVALBUTEROL HCL 1.25 MG/0.5ML IN NEBU
1.2500 mg | INHALATION_SOLUTION | Freq: Four times a day (QID) | RESPIRATORY_TRACT | Status: DC
  Administered 2015-02-26 – 2015-02-27 (×4): 1.25 mg via RESPIRATORY_TRACT
  Filled 2015-02-26 (×4): qty 0.5

## 2015-02-26 MED ORDER — MORPHINE SULFATE (PF) 4 MG/ML IV SOLN
4.0000 mg | Freq: Once | INTRAVENOUS | Status: AC
  Administered 2015-02-26: 4 mg via INTRAVENOUS
  Filled 2015-02-26: qty 1

## 2015-02-26 MED ORDER — LORAZEPAM 2 MG/ML IJ SOLN
1.0000 mg | Freq: Once | INTRAMUSCULAR | Status: AC
  Administered 2015-02-26: 1 mg via INTRAVENOUS
  Filled 2015-02-26: qty 1

## 2015-02-26 MED ORDER — SODIUM CHLORIDE 0.9 % IJ SOLN: 9.0000 mL | INTRAMUSCULAR | Status: DC | PRN

## 2015-02-26 NOTE — Progress Notes (Signed)
[facility] does not have bed availability today.  Pt's desire is to go to [facility].   [facility] alerted to desire for [facility] placement.  Waiting for bed availability.  [place], [place] ext: [phone]

## 2015-02-26 NOTE — Progress Notes (Signed)
[facility] PROGRESS NOTE  KHARI LETT OBS:[id number] DOB: March 08, 1951 DOA: 02/25/2015  PCP: Tivis Ringer, MD  Brief HPI: 64 year old Caucasian female with a past medical history of lung cancer, COPD, ischemic cardiomyopathy, coronary artery disease, peripheral vascular disease, who was under hospice services and home due to her advanced COPD and vascular disease not amenable to intervention. She presented with complaints of worsening shortness of breath and chest pain.  Past medical history:  Past Medical History  Diagnosis Date  . Acute myocardial infarction, unspecified site, episode of care unspecified 2011    VF arrest. Ruled in, cath with no clear culprit lesion, inferior WMA on LV gram, medical therapy  . Esophageal dysmotility   . Hemorrhoids   . Esophageal stricture   . Diaphragmatic hernia without mention of obstruction or gangrene   . 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 CAD (coronary artery disease)     CABG'96., Xience DES -VG-RCA (01/12/13)  . Peripheral neuropathy ([place])   . Hyperlipidemia   . Peptic ulcer, unspecified site, unspecified as acute or chronic, without mention of hemorrhage, perforation, or obstruction   . Depressive disorder, not elsewhere classified   . Anxiety state, unspecified   . COPD (chronic obstructive pulmonary disease) ([place])   . GERD (gastroesophageal reflux disease)   . Candida esophagitis ([place])     on multiple occasions  . C. difficile diarrhea   . PVD (peripheral vascular disease) ([place])   . Headache(784.0)   . Weight loss 06/13/2011    scheduled here by family doctor  . CHF (congestive heart failure) ([place]) 2011  . Asthma   . Hypertension   . History of nuclear stress test 07/14/2010    dipyriadmole; normal pattern of perfusion, low risk   . History of tobacco use     quit 05/2009  . Non-small cell carcinoma of lung, stage 3 (HCC)     Dr. Roxan Hockey  . History of radiation therapy 02/28/2012-04/14/2012   right lung  . Bilateral carotid artery disease ([place])   . TIA (transient ischemic attack)     amaurosis fugax  . Tubular adenoma of colon   . On home oxygen therapy     "4L; 24/7" (01/08/2015)  . Atrial fibrillation [facility])     Consultants: palliative medicine  Procedures: none  Antibiotics: none  Subjective: Patient continues to have severe chest pain. 8 out of 10 in intensity. Associated with shortness of breath. Denies any nausea, vomiting. Very anxious.  Objective: Vital Signs  Filed Vitals:   02/25/15 2358 02/26/15 0454 02/26/15 0700 02/26/15 0900  BP: 143/68 122/87 139/97 139/97  Pulse: 70 78 131 131  Temp: 98.3 F (36.8 C) 98.2 F (36.8 C)    TempSrc: Oral Oral    Resp: 20 22    Height:      Weight:  42.82 kg (94 lb 6.4 oz)    SpO2: 99% 100%      Intake/Output Summary (Last 24 hours) at 02/26/15 0951 Last data filed at 02/26/15 0649  Gross per 24 hour  Intake 1998.75 ml  Output   2500 ml  Net -501.25 ml   Filed Weights   02/25/15 1257 02/25/15 1800 02/26/15 0454  Weight: 42.91 kg (94 lb 9.6 oz) 42.956 kg (94 lb 11.2 oz) 42.82 kg (94 lb 6.4 oz)    General appearance: alert, cooperative, appears stated age, mild distress and very anxious Resp: end expiratory wheezing bilaterally. No crackles. No rhonchi. Cardio: [place], S2 is tachycardic. Regular. No  S3, S4. No rubs, murmurs, or bruit. No pedal edema. GI: soft, non-tender; bowel sounds normal; no masses,  no organomegaly Extremities: extremities normal, atraumatic, no cyanosis or edema Neurologic: no focal deficits  Lab Results:  Basic Metabolic Panel:  Recent Labs Lab 02/25/15 1254 02/26/15 0312  NA 122* 129*  K 5.7* 4.9  CL 76* 88*  CO2 35* 31  GLUCOSE 117* 106*  BUN 15 13  CREATININE 0.61 0.52  CALCIUM 9.6 8.8*   CBC:  Recent Labs Lab 02/25/15 1254  WBC 8.8  HGB 12.1  HCT 35.5*  MCV 90.8  PLT 177   Cardiac Enzymes:  Recent Labs Lab 02/25/15 1254  TROPONINI 0.14*   BNP  (last 3 results)  Recent Labs  01/08/15 0918 01/25/15 1006 02/25/15 1254  BNP 261.2* 634.8* 1406.6*    Studies/Results: Dg Chest Portable 1 View  02/25/2015   CLINICAL DATA:  Shortness of breath.  EXAM: PORTABLE CHEST 1 VIEW  COMPARISON:  CT scan and chest x-ray dated 01/25/2015 and chest x-rays dated 01/09/2015 and 06/23/2014  FINDINGS: The lungs are hyperinflated with flattening of the diaphragm. Slight scarring at the right base, stable. Heart size and pulmonary vascularity are normal. No infiltrates or effusions. No pneumothorax. No acute osseous abnormality. Previous CABG.  IMPRESSION: Emphysema.  No acute abnormalities.   Electronically Signed   By: Lorriane Shire M.D.   On: 02/25/2015 13:37    Medications:  Scheduled: . diltiazem  240 mg Oral Daily  . enoxaparin (LOVENOX) injection  20 mg Subcutaneous QHS  . feeding supplement (ENSURE ENLIVE)  237 mL Oral TID BM  . levalbuterol  1.25 mg Nebulization 4 times per day  . methylPREDNISolone (SOLU-MEDROL) injection  60 mg Intravenous Q6H  . metoprolol  5 mg Intravenous Once  . morphine   Intravenous 6 times per day  . nicotine  21 mg Transdermal Daily  . nitroGLYCERIN  0.5 inch Topical 4 times per day  . pantoprazole  40 mg Oral Daily  . polyethylene glycol  17 g Oral Daily  . sodium chloride  3 mL Intravenous Q12H   Continuous: . sodium chloride 75 mL/hr at 02/26/15 1056   [id number] **OR** acetaminophen, benzonatate, bisacodyl, diltiazem, diphenhydrAMINE **OR** diphenhydrAMINE, guaiFENesin, LORazepam, morphine injection, morphine CONCENTRATE, naloxone **AND** sodium chloride, ondansetron **OR** ondansetron (ZOFRAN) IV  Assessment/Plan:  Principal Problem:   Acute and chronic respiratory failure with hypoxia (HCC) Active Problems:   End stage COPD (HCC)   Protein-calorie malnutrition, severe (HCC)   Cardiomyopathy, ischemic - mild to moderate. EF ~45%   Acute on chronic respiratory failure with hypoxia (HCC)    Hyponatremia   Atrial flutter (HCC)    Chest pain with SVT ST depression with t inversion noted on EKG. This could be related to tachycardia. Could be due to ischemic changes. However patient is not a candidate for any cardiac intervention. Mainstay will be symptom management. Intravenous metoprolol for rate control. High dose intravenous morphine for pain control. Oxygen. If there is an element of angina, Nitroglycerin might provide some relief. She may eventually need morphine infusion/PCA.   Acute on chronic respiratory failure/end-stage COPD Patient was under hospice care at home. Palliative medicine consult is pending. Patient and family interested in going to residential hospice.  History of coronary artery disease status post CABG/ischemic cardiomyopathy with EF of 45% Pain control. Morphine and oxygen. Not a candidate for aggressive intervention.  All other issues are stable. In all likelihood we are heading towards comfort care.  DVT Prophylaxis:  Lovenox    Code Status: DO NOT RESUSCITATE  Family Communication: discussed with the patient and her daughter  Disposition Plan: await palliative medicine input.    LOS: 1 day   [facility] [facility] Pager [phone] 02/26/2015, 9:51 AM  If 7PM-7AM, please contact night-coverage at [email or web address], password [facility]

## 2015-02-26 NOTE — Consult Note (Signed)
Consultation Note Date: 02/26/2015   Patient Name: [name]  DOB: Jun 26, 1950  MRN: [id number]  Age / Sex: 64 y.o., female   PCP: Prince Solian, MD Referring Physician: Bonnielee Haff, MD  Reason for Consultation: Establishing goals of care  Palliative Care Assessment and Plan Summary of Established Goals of Care and Medical Treatment Preferences  64 year old female with history of lung cancer and states COPD, ischemic cardiomyopathy EF of 45%, coronary artery disease, stage III non-small cell lung cancer, ongoing nicotine dependence, recently discharged on 9/6, followed by hospice of [facility], who presented to the ER  with worsening shortness of breath Palliative consult requested. Patient has been seen and evaluated by hospice liaisons. It was noted that patient and family leaning more towards a conservative approach, trial of IV steroids, nebulizers, no pneumonia on chest x-ray Extensive discussions with the patient, daughter, sister and niece present at the bedside. Awaiting hospice bed availability. Discussed appropriate use of morphine PCA for symptom management of dyspnea, air hunger. Low-dose morphine PCA. Patient requesting for nicotine patch. Appears dyspneic, appears anxious. Prognosis of hours-days discussed with the patient's family outside the room.  Contacts/Participants in Discussion: Primary Decision Maker:   Patient's daughter Kathyrn Sheriff at [phone] HCPOA: yes    Code Status/Advance Care Planning:  DO NOT RESUSCITATE/DO NOT INTUBATE  Comfort measures only  Transfer to inpatient hospice when bed available  Symptom Management:   Acute on chronic uncontrolled dyspnea. Some anxiety, air hunger. Patient appears mild to moderately dyspneic even at rest.   Morphine PCA  Palliative Prophylaxis: Yes, Dulcolax suppository  Nicotine patch  Additional Recommendations (Limitations, Scope, Preferences):  Transfer to inpatient hospice when bed  available  Titrate morphine PCA for comfort measures Psycho-social/Spiritual:   Support System: Strong, daughter, sister and niece are present at the bedside.  Desire for further Chaplaincy support:no  Prognosis: Hours - Days  Discharge Planning:  Hospice facility  Values: Comfort measures only at end of life Life limiting illness: Stage COPD      Chief Complaint/History of Present Illness: Dyspnea  Primary Diagnoses  Present on Admission:  . Acute and chronic respiratory failure with hypoxia ([place]) . Cardiomyopathy, ischemic - mild to moderate. EF ~45% . End stage COPD ([place]) . Protein-calorie malnutrition, severe ([place]) . Atrial flutter ([place]) . Acute on chronic respiratory failure with hypoxia (HCC)  Palliative Review of Systems: Completed I have reviewed the medical record, interviewed the patient and family, and examined the patient. The following aspects are pertinent.  Past Medical History  Diagnosis Date  . Acute myocardial infarction, unspecified site, episode of care unspecified 2011    VF arrest. Ruled in, cath with no clear culprit lesion, inferior WMA on LV gram, medical therapy  . Esophageal dysmotility   . Hemorrhoids   . Esophageal stricture   . Diaphragmatic hernia without mention of obstruction or gangrene   . 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 CAD (coronary artery disease)     CABG'96., Xience DES -VG-RCA (01/12/13)  . Peripheral neuropathy ([place])   . Hyperlipidemia   . Peptic ulcer, unspecified site, unspecified as acute or chronic, without mention of hemorrhage, perforation, or obstruction   . Depressive disorder, not elsewhere classified   . Anxiety state, unspecified   . COPD (chronic obstructive pulmonary disease) ([place])   . GERD (gastroesophageal reflux disease)   . Candida esophagitis ([place])     on multiple occasions  . C. difficile diarrhea   . PVD (peripheral vascular disease) ([place])   .  Headache(784.0)   . Weight loss  06/13/2011    scheduled here by family doctor  . CHF (congestive heart failure) ([place]) 2011  . Asthma   . Hypertension   . History of nuclear stress test 07/14/2010    dipyriadmole; normal pattern of perfusion, low risk   . History of tobacco use     quit 05/2009  . Non-small cell carcinoma of lung, stage 3 (HCC)     Dr. Roxan Hockey  . History of radiation therapy 02/28/2012-04/14/2012    right lung  . Bilateral carotid artery disease ([place])   . TIA (transient ischemic attack)     amaurosis fugax  . Tubular adenoma of colon   . On home oxygen therapy     "4L; 24/7" (01/08/2015)  . Atrial fibrillation [facility])    Social History   Social History  . Marital Status: Widowed    Spouse Name: N/A  . Number of Children: 2  . Years of Education: N/A   Occupational History  . disabled     back problems   Social History Main Topics  . Smoking status: Current Every Day Smoker -- 1.50 packs/day for 30 years    Types: Cigarettes  . Smokeless tobacco: Never Used  . Alcohol Use: No  . Drug Use: Yes    Special: Flunitrazepam  . Sexual Activity: Not Asked   Other Topics Concern  . None   Social History Narrative   Married with 2 children, husband has Alzheimer's dementia, retired now, previously Corporate treasurer, Quit smoking   Family History  Problem Relation Age of Onset  . Lung cancer Father     also CVA  . Heart disease Father   . Asthma Father   . Allergies Father   . Cancer Father   . Heart disease Mother     also CVA, MI  . Brain cancer Mother   . Heart disease Sister   . Colon cancer Neg Hx   . Lung cancer Brother 32    also ENT cancer  . Hypertension Brother 29    also aneurysm, early CAD  . Breast cancer Other     family hx   Scheduled Meds: . diltiazem  240 mg Oral Daily  . enoxaparin (LOVENOX) injection  20 mg Subcutaneous QHS  . feeding supplement (ENSURE ENLIVE)  237 mL Oral TID BM  . levalbuterol  1.25 mg Nebulization 4 times per day  .  methylPREDNISolone (SOLU-MEDROL) injection  60 mg Intravenous Q6H  . metoprolol  5 mg Intravenous Once  . morphine   Intravenous 6 times per day  . nicotine  21 mg Transdermal Daily  . nitroGLYCERIN  0.5 inch Topical 4 times per day  . pantoprazole  40 mg Oral Daily  . polyethylene glycol  17 g Oral Daily  . sodium chloride  3 mL Intravenous Q12H   Continuous Infusions: . sodium chloride 75 mL/hr at 02/26/15 1056   PRN Meds:.acetaminophen **OR** acetaminophen, benzonatate, diltiazem, diphenhydrAMINE **OR** diphenhydrAMINE, guaiFENesin, LORazepam, morphine injection, morphine CONCENTRATE, naloxone **AND** sodium chloride, ondansetron **OR** ondansetron (ZOFRAN) IV, ondansetron (ZOFRAN) IV Medications Prior to Admission:  Prior to Admission medications   Medication Sig Start Date End Date Taking? Authorizing Provider  aspirin EC 81 MG tablet Take 81 mg by mouth daily.   Yes Historical Provider, MD  benzonatate (TESSALON) 100 MG capsule Take 100 mg by mouth 3 (three) times daily as needed for cough.  01/15/15  Yes Historical Provider, MD  [employer] ARTHRITIS PAIN  RELIEF 650 MG CR tablet Take 650 mg by mouth every 8 (eight) hours as needed for pain.  01/15/15  Yes Historical Provider, MD  [employer] BISACODYL 5 MG EC tablet Take 10 mg by mouth daily as needed. 02/12/15  Yes Historical Provider, MD  diltiazem (DILACOR XR) 120 MG 24 hr capsule Take 120 mg by mouth daily.   Yes Historical Provider, MD  famotidine (PEPCID) 20 MG tablet Take 1 tablet (20 mg total) by mouth at bedtime. 12/11/14  Yes Lafayette Dragon, MD  feeding supplement, ENSURE ENLIVE, (ENSURE ENLIVE) LIQD Take 237 mLs by mouth 3 (three) times daily between meals. 01/28/15  Yes Geradine Girt, DO  fluconazole (DIFLUCAN) 100 MG tablet Take 1 tablet (100 mg total) by mouth daily. 01/10/15  Yes Kinnie Feil, MD  furosemide (LASIX) 20 MG tablet Take 20 mg by mouth daily.   Yes Historical Provider, MD  ipratropium-albuterol (DUONEB) 0.5-2.5 (3) MG/3ML  SOLN Take 3 mLs by nebulization every 6 (six) hours as needed (shortness of breath).   Yes Historical Provider, MD  LORazepam (ATIVAN) 0.5 MG tablet Take 1 mg by mouth at bedtime.   Yes Historical Provider, MD  magic mouthwash SOLN Swish and swallow 5 mLs 4 (four) times daily as needed for mouth pain.   Yes Historical Provider, MD  morphine (ROXANOL) 20 MG/ML concentrated solution Take 10-20 mg by mouth every 2 (two) hours.   Yes Historical Provider, MD  MUCINEX 600 MG 12 hr tablet Take 1,200 mg by mouth every 12 (twelve) hours as needed for cough.  01/15/15  Yes Historical Provider, MD  omeprazole (PRILOSEC) 40 MG capsule Take 40 mg by mouth daily. 02/19/15  Yes Historical Provider, MD  ondansetron (ZOFRAN) 4 MG tablet Take 4 mg by mouth every 8 (eight) hours as needed for nausea or vomiting.  02/14/15  Yes Historical Provider, MD  OXYGEN-HELIUM IN Inhale 4 L into the lungs continuous.   Yes Historical Provider, MD  polyethylene glycol powder (GLYCOLAX/MIRALAX) powder Take 17 g by mouth daily.  02/12/15  Yes Historical Provider, MD  predniSONE (DELTASONE) 10 MG tablet 40 mg x 5 days, 30 mg x 5 days, 20 mg x 5 days, 10 mg x 5 days, 5 mg x 4 days then d/c Patient taking differently: Take 10 mg by mouth daily with breakfast.  01/28/15  Yes Geradine Girt, DO  SYMBICORT 160-4.5 MCG/ACT inhaler Inhale 2 puffs into the lungs every 12 (twelve) hours as needed (shortness of breath).  02/19/15  Yes Historical Provider, MD  ZETIA 10 MG tablet TAKE 1 TABLET BY MOUTH EVERY DAY 03/25/14  Yes Lorretta Harp, MD  albuterol (PROAIR HFA) 108 (90 BASE) MCG/ACT inhaler Inhale 1-2 puffs into the lungs every 6 (six) hours as needed for wheezing or shortness of breath. Patient not taking: Reported on 02/25/2015 01/10/15   Kinnie Feil, MD  albuterol (PROVENTIL) (2.5 MG/3ML) 0.083% nebulizer solution Take 3 mLs (2.5 mg total) by nebulization every 6 (six) hours as needed for wheezing or shortness of breath. Patient not  taking: Reported on 02/25/2015 01/10/15   Kinnie Feil, MD  diazepam (VALIUM) 2 MG tablet Take 1 tablet (2 mg total) by mouth every 4 (four) hours as needed for anxiety. Patient not taking: Reported on 02/25/2015 01/28/15   Geradine Girt, DO  diltiazem (CARDIZEM) 10 mg/ml oral suspension Take 6 mLs (60 mg total) by mouth every 6 (six) hours. Patient not taking: Reported on 02/25/2015 01/28/15  Geradine Girt, DO  feeding supplement (BOOST / RESOURCE BREEZE) LIQD Take 1 Container by mouth 3 (three) times daily between meals. Patient not taking: Reported on 02/25/2015 01/28/15   Geradine Girt, DO  HYDROcodone-homatropine [facility]) 5-1.5 MG/5ML syrup Take 5 mLs by mouth every 6 (six) hours as needed for cough. Patient not taking: Reported on 02/25/2015 01/28/15   Geradine Girt, DO  Morphine Sulfate (MORPHINE CONCENTRATE) 10 MG/0.5ML SOLN concentrated solution Take 0.25 mLs (5 mg total) by mouth every 4 (four) hours. Patient not taking: Reported on 02/25/2015 01/28/15   Geradine Girt, DO  Morphine Sulfate (MORPHINE CONCENTRATE) 10 MG/0.5ML SOLN concentrated solution Take 0.13 mLs (2.6 mg total) by mouth every 2 (two) hours as needed for shortness of breath. Patient not taking: Reported on 02/25/2015 01/28/15   Geradine Girt, DO  pantoprazole (PROTONIX) 40 MG tablet Take 1 tablet (40 mg total) by mouth 2 (two) times daily. Patient not taking: Reported on 02/25/2015 01/10/15   Kinnie Feil, MD  SPIRIVA HANDIHALER 18 MCG inhalation capsule PLACE 1 CAPSULE (18 MCG TOTAL) INTO INHALER AND INHALE DAILY. Patient not taking: Reported on 02/25/2015 10/28/14   Noralee Space, MD  thiamine 100 MG tablet Take 1 tablet (100 mg total) by mouth daily. Patient not taking: Reported on 02/25/2015 01/28/15   Geradine Girt, DO   Allergies  Allergen Reactions  . Amiodarone Shortness Of Breath and Other (See Comments)    Drops HR too low, fatigue  . Clopidogrel Bisulfate Hives  . Dilaudid [Hydromorphone Hcl] Other (See Comments)     "I couldn't function for 2 days." lethargy  . Multaq [Dronedarone] Other (See Comments)    Heart races   . Tape Itching and Rash    Tears skin.  Please use "paper" tape   CBC:    Component Value Date/Time   WBC 8.8 02/25/2015 1254   WBC 7.7 11/14/2014 0953   HGB 12.1 02/25/2015 1254   HGB 12.9 11/14/2014 0953   HCT 35.5* 02/25/2015 1254   HCT 38.2 11/14/2014 0953   PLT 177 02/25/2015 1254   PLT 192 11/14/2014 0953   MCV 90.8 02/25/2015 1254   MCV 95.7 11/14/2014 0953   NEUTROABS 5.8 01/25/2015 1005   NEUTROABS 6.0 11/14/2014 0953   LYMPHSABS 0.6* 01/25/2015 1005   LYMPHSABS 1.0 11/14/2014 0953   MONOABS 0.6 01/25/2015 1005   MONOABS 0.5 11/14/2014 0953   EOSABS 0.1 01/25/2015 1005   EOSABS 0.1 11/14/2014 0953   BASOSABS 0.0 01/25/2015 1005   BASOSABS 0.0 11/14/2014 0953   Comprehensive Metabolic Panel:    Component Value Date/Time   NA 129* 02/26/2015 0312   NA 139 11/14/2014 0953   K 4.9 02/26/2015 0312   K 4.3 11/14/2014 0953   CL 88* 02/26/2015 0312   CL 104 11/07/2012 0839   CO2 31 02/26/2015 0312   CO2 32* 11/14/2014 0953   BUN 13 02/26/2015 0312   BUN 11.5 11/14/2014 0953   CREATININE 0.52 02/26/2015 0312   CREATININE 0.6 11/14/2014 0953   GLUCOSE 106* 02/26/2015 0312   GLUCOSE 88 11/14/2014 0953   GLUCOSE 92 11/07/2012 0839   CALCIUM 8.8* 02/26/2015 0312   CALCIUM 9.2 11/14/2014 0953   AST 21 11/14/2014 0953   AST 28 06/26/2014 0535   ALT 17 11/14/2014 0953   ALT 38* 06/26/2014 0535   ALKPHOS 98 11/14/2014 0953   ALKPHOS 79 06/26/2014 0535   BILITOT 0.29 11/14/2014 0953   BILITOT 0.3 06/26/2014 0535  PROT 6.7 11/14/2014 0953   PROT 5.5* 06/26/2014 0535   ALBUMIN 3.9 11/14/2014 0953   ALBUMIN 3.1* 06/26/2014 0535    Physical Exam: Vital Signs: BP 105/70 mmHg  Pulse 75  Temp(Src) 98.7 F (37.1 C) (Oral)  Resp 20  Ht '5\' 7"'$  (1.702 m)  Wt 42.82 kg (94 lb 6.4 oz)  BMI 14.78 kg/m2  SpO2 100% SpO2: SpO2: 100 % O2 Device: O2 Device: Nasal  Cannula O2 Flow Rate: O2 Flow Rate (L/min): 4 L/min Intake/output summary:  Intake/Output Summary (Last 24 hours) at 02/26/15 1442 Last data filed at 02/26/15 1437  Gross per 24 hour  Intake 2478.75 ml  Output   2900 ml  Net -421.25 ml   LBM: Last BM Date: 02/25/15 Baseline Weight: Weight: 37.195 kg (82 lb) Most recent weight: Weight: 42.82 kg (94 lb 6.4 oz) (Scale B)  Exam Findings:   mild to moderately dyspneic on exam Shallow breathing S1-S2 Cachectic  Abdomen soft No edema                       Palliative Performance Scale: 20% Additional Data Reviewed: Recent Labs     02/25/15  1254  02/26/15  0312  WBC  8.8   --   HGB  12.1   --   PLT  177   --   NA  122*  129*  BUN  15  13  CREATININE  0.61  0.52     Time In: 1340 Time Out: 1440 Time Total: 60 min  Greater than 50%  of this time was spent counseling and coordinating care related to the above assessment and plan.  Signed by: Loistine Chance, MD [id number] Loistine Chance, MD  02/26/2015, 2:42 PM  Please contact Palliative Medicine Team phone at [phone] for questions and concerns.

## 2015-02-26 NOTE — Plan of Care (Signed)
Problem: Phase I Progression Outcomes Goal: Pain controlled Outcome: Not Progressing Poor pain control today. To begin Morphine drip on patient.

## 2015-02-26 NOTE — Progress Notes (Signed)
Initial Nutrition Assessment  DOCUMENTATION CODES:   Severe malnutrition in context of chronic illness, Underweight  INTERVENTION:  Provide Ensure Enlive po TID (chocolate only), each supplement provides 350 kcal and 20 grams of protein Provide snack once daily  NUTRITION DIAGNOSIS:   Malnutrition related to chronic illness as evidenced by severe depletion of body fat, severe depletion of muscle mass, meal completion < 25%.   GOAL:   Other (Comment) (Comfort)    MONITOR:   PO intake, Skin, Labs, Weight trends  REASON FOR ASSESSMENT:   Malnutrition Screening Tool    ASSESSMENT:   64 year old female with history of lung cancer and states COPD, ischemic cardiomyopathy EF of 45%, coronary artery disease, stage III non-small cell lung cancer, ongoing nicotine dependence, recently discharged on 9/6, followed by hospice of [facility], who presents to the ER today with worsening shortness of breath.  Pt familiar to nutrition team from previous admissions. Pt is extremely underweight though weight has been stable the past few months. She continues to have severe muscle and fat wasting. She has been eating every little. Reports drinking Ensure supplements 3 times daily PTA and would like to continue. RD offered snacks as well.   Labs: low sodium, low chloride, low calcium  Diet Order:  Diet regular Room service appropriate?: Yes; Fluid consistency:: Thin  Skin:  Reviewed, no issues  Last BM:  10/4  Height:   Ht Readings from Last 1 Encounters:  02/25/15 '5\' 7"'$  (1.702 m)    Weight:   Wt Readings from Last 1 Encounters:  02/26/15 94 lb 6.4 oz (42.82 kg)    Ideal Body Weight:  61.4 kg  BMI:  Body mass index is 14.78 kg/(m^2). (Extremely underweight)  Estimated Nutritional Needs:   Kcal:  1500-1700  Protein:  65-75 grams  Fluid:  1.5 L/day  EDUCATION NEEDS:   No education needs identified at this time  [place], LDN Inpatient Clinical  Dietitian Pager: [phone] After Hours Pager: [phone]

## 2015-02-26 NOTE — Progress Notes (Signed)
Inpatient [place] [facility] RN Visit. GIP related admission to [facility] DX: COPD and Lung CA:     Pt. Is a DNR code status. Pt. Sitting up in bed resting and appears relaxed. Color good. Resps regular and no audible wheezing heard. Pt. currently on 4 L [place]. Pt's daughter and grandson are at the bedside.  Pt's daughter reports MD wants pt. to consider [facility] where her pain and breathing sx can be managed better. Pt. Appears to be undecided as she wants to go home but knows her sx will not be well managed there with daughter working. Pt. Asked if she still smokes which she replied yes. Pt. agreeable to try a nicotine patch and Staff RN Darla will request an order for this.  Pt. and family advised LCSW from [place] will check into [facility] availability and will get back with the family. Palliative Care team contacted and will see pt. Today. [facility] will continue to follow pt. Daily. Please call with any questions.  [facility] Liaison [phone]

## 2015-02-27 MED ORDER — PREDNISONE 10 MG PO TABS: 60.0000 mg | ORAL_TABLET | Freq: Two times a day (BID) | ORAL | Status: DC

## 2015-02-27 MED ORDER — PREDNISONE 20 MG PO TABS: 60.0000 mg | ORAL_TABLET | Freq: Two times a day (BID) | ORAL | Status: AC

## 2015-02-27 MED ORDER — MORPHINE BOLUS VIA INFUSION: 2.0000 mg | INTRAVENOUS | Status: AC | PRN

## 2015-02-27 MED ORDER — LORAZEPAM 2 MG/ML IJ SOLN: 0.5000 mg | INTRAMUSCULAR | Status: AC | PRN

## 2015-02-27 MED ORDER — NICOTINE 21 MG/24HR TD PT24: 21.0000 mg | MEDICATED_PATCH | Freq: Every day | TRANSDERMAL | Status: AC

## 2015-02-27 MED ORDER — MORPHINE SULFATE 25 MG/ML IV SOLN: 2.0000 mg/h | INTRAVENOUS | Status: AC

## 2015-02-27 MED ORDER — NITROGLYCERIN 2 % TD OINT: 0.5000 [in_us] | TOPICAL_OINTMENT | Freq: Four times a day (QID) | TRANSDERMAL | Status: AC

## 2015-02-27 NOTE — Progress Notes (Signed)
CSW notified that patient has been accepted to [facility]- arrangements made by Barbra Sarks, Hospice RN for admission today.  DC summary sent to [facility] and d/c back arranged.  CSW spoke with Erling Conte, LCSW Liaison at [facility] to confirm process.  CSW notified patient's daughter Ophelia Charter who was aware of d/c plan and is agreeable.  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 Patient not agitated but anxious to "leave this place as everyone here is 'in on it."  She is unable to explain further.  CSW provided reassurance and cup of cold water; talking to her quietly with hopes of her becoming a little calmer. This was somewhat successful but she remained adamant that she needed to leave the hospital ASAP.  Nursing notified of d/c plan and called report to facility. EMS arranged. CSW signing off.  Lorie Phenix. Pauline Good, [place]

## 2015-02-27 NOTE — Progress Notes (Signed)
IV removed per discharge order. Morphine '4mg'$  IVP given for transport to [facility] via EMS. Phone report given to [employer] at [place]. Discharged to EMS.

## 2015-02-27 NOTE — Discharge Summary (Signed)
[facility]  Physician Discharge Summary   Patient ID: [name] MRN: [id number] DOB/AGE: 62/07/1950 62 y.o.  Admit date: 02/25/2015 Discharge date: 02/27/2015  PCP: Tivis Ringer, MD  DISCHARGE DIAGNOSES:  Principal Problem:   Acute and chronic respiratory failure with hypoxia ([place]) Active Problems:   End stage COPD (HCC)   Protein-calorie malnutrition, severe (HCC)   Cardiomyopathy, ischemic - mild to moderate. EF ~45%   Acute on chronic respiratory failure with hypoxia (HCC)   Hyponatremia   Atrial flutter ([place])   Dyspnea   Encounter for palliative care   RECOMMENDATIONS FOR OUTPATIENT FOLLOW UP: 1. Patient being discharged to residential hospice  DISCHARGE CONDITION: stable  Diet recommendation: Regular as tolerated  Filed Weights   02/25/15 1800 02/26/15 0454 02/27/15 0459  Weight: 42.956 kg (94 lb 11.2 oz) 42.82 kg (94 lb 6.4 oz) 43.409 kg (95 lb 11.2 oz)    INITIAL HISTORY: 64 year old Caucasian female with a past medical history of lung cancer, COPD, ischemic cardiomyopathy, coronary artery disease, peripheral vascular disease, who was under hospice services and home due to her advanced COPD and vascular disease not amenable to intervention. She presented with complaints of worsening shortness of breath and chest pain.  Consultations:  Palliative medicine   HOSPITAL COURSE:   Chest pain with SVT ST depression with t inversion noted on EKG. EKG changes could be related to tachycardia. Could be due to ischemic changes. However patient is not a candidate for any cardiac intervention. Patient was given high-dose morphine. Nitroglycerin off and was utilized. She was given metoprolol for rate control. Symptoms have improved. She was also given Ativan for anxiety. Patient was subsequently seen by palliative medicine. She was started on a morphine infusion. Colon is comfort care. Plan is for the patient to go to residential hospice.   Acute on  chronic respiratory failure/end-stage COPD Stable. Continue oxygen.  History of coronary artery disease status post CABG/ischemic cardiomyopathy with EF of 45% Pain control. Morphine and oxygen. Not a candidate for aggressive intervention.  All other issues are stable. Okay for discharge to residential hospice.    PERTINENT LABS:  The results of significant diagnostics from this hospitalization (including imaging, microbiology, ancillary and laboratory) are listed below for reference.     Labs: Basic Metabolic Panel:  Recent Labs Lab 02/25/15 1254 02/26/15 0312  NA 122* 129*  K 5.7* 4.9  CL 76* 88*  CO2 35* 31  GLUCOSE 117* 106*  BUN 15 13  CREATININE 0.61 0.52  CALCIUM 9.6 8.8*   CBC:  Recent Labs Lab 02/25/15 1254  WBC 8.8  HGB 12.1  HCT 35.5*  MCV 90.8  PLT 177   Cardiac Enzymes:  Recent Labs Lab 02/25/15 1254  TROPONINI 0.14*   BNP: BNP (last 3 results)  Recent Labs  01/08/15 0918 01/25/15 1006 02/25/15 1254  BNP 261.2* 634.8* 1406.6*    IMAGING STUDIES Dg Chest Portable 1 View  02/25/2015   CLINICAL DATA:  Shortness of breath.  EXAM: PORTABLE CHEST 1 VIEW  COMPARISON:  CT scan and chest x-ray dated 01/25/2015 and chest x-rays dated 01/09/2015 and 06/23/2014  FINDINGS: The lungs are hyperinflated with flattening of the diaphragm. Slight scarring at the right base, stable. Heart size and pulmonary vascularity are normal. No infiltrates or effusions. No pneumothorax. No acute osseous abnormality. Previous CABG.  IMPRESSION: Emphysema.  No acute abnormalities.   Electronically Signed   By: Lorriane Shire M.D.   On: 02/25/2015 13:37    DISCHARGE EXAMINATION: Danley Danker  Vitals:   02/26/15 2040 02/26/15 2328 02/27/15 0459 02/27/15 0850  BP:  141/60 127/67   Pulse: 71 74 116   Temp:  98 F (36.7 C) 97.6 F (36.4 C)   TempSrc:  Oral Oral   Resp: '20 20 22   '$ Height:      Weight:   43.409 kg (95 lb 11.2 oz)   SpO2: 97% 98% 99% 99%   General  appearance: alert, cooperative, appears stated age and no distress Resp: clear to auscultation bilaterally Cardio: regular rate and rhythm, S1, S2 normal, no murmur, click, rub or gallop GI: soft, non-tender; bowel sounds normal; no masses,  no organomegaly Extremities: extremities normal, atraumatic, no cyanosis or edema  DISPOSITION: Residential Hospice  Discharge Instructions    Diet general    Complete by:  As directed      Increase activity slowly    Complete by:  As directed            ALLERGIES:  Allergies  Allergen Reactions  . Amiodarone Shortness Of Breath and Other (See Comments)    Drops HR too low, fatigue  . Clopidogrel Bisulfate Hives  . Dilaudid [Hydromorphone Hcl] Other (See Comments)    "I couldn't function for 2 days." lethargy  . Multaq [Dronedarone] Other (See Comments)    Heart races   . Tape Itching and Rash    Tears skin.  Please use "paper" tape     Current Discharge Medication List    START taking these medications   Details  LORazepam (ATIVAN) 2 MG/ML injection Inject 0.25 mLs (0.5 mg total) into the vein every 2 (two) hours as needed (anxiety). Qty: 1 mL, Refills: 0    morphine 250 mg in dextrose 5 % 250 mL Inject 2 mg/hr into the vein continuous.    morphine 5 mg/mL SOLN Inject 2 mg into the vein every hour as needed (or dyspnea). Refills: 0    nicotine (NICODERM CQ - DOSED IN MG/24 HOURS) 21 mg/24hr patch Place 1 patch (21 mg total) onto the skin daily. Qty: 28 patch, Refills: 0    nitroGLYCERIN (NITROGLYN) 2 % ointment Apply 0.5 inches topically every 6 (six) hours. Qty: 30 g, Refills: 0      CONTINUE these medications which have CHANGED   Details  predniSONE (DELTASONE) 20 MG tablet Take 3 tablets (60 mg total) by mouth 2 (two) times daily with a meal.      CONTINUE these medications which have NOT CHANGED   Details  benzonatate (TESSALON) 100 MG capsule Take 100 mg by mouth 3 (three) times daily as needed for cough.    Refills: 3    [employer] ARTHRITIS PAIN RELIEF 650 MG CR tablet Take 650 mg by mouth every 8 (eight) hours as needed for pain.  Refills: 0    [employer] BISACODYL 5 MG EC tablet Take 10 mg by mouth daily as needed. Refills: 1    famotidine (PEPCID) 20 MG tablet Take 1 tablet (20 mg total) by mouth at bedtime. Qty: 30 tablet, Refills: 4    feeding supplement, ENSURE ENLIVE, (ENSURE ENLIVE) LIQD Take 237 mLs by mouth 3 (three) times daily between meals. Qty: 237 mL, Refills: 12    fluconazole (DIFLUCAN) 100 MG tablet Take 1 tablet (100 mg total) by mouth daily. Qty: 14 tablet, Refills: 0    furosemide (LASIX) 20 MG tablet Take 20 mg by mouth daily.    MUCINEX 600 MG 12 hr tablet Take 1,200 mg by mouth every 12 (  twelve) hours as needed for cough.  Refills: 0    omeprazole (PRILOSEC) 40 MG capsule Take 40 mg by mouth daily. Refills: 1    ondansetron (ZOFRAN) 4 MG tablet Take 4 mg by mouth every 8 (eight) hours as needed for nausea or vomiting.  Refills: 1    OXYGEN-HELIUM IN Inhale 4 L into the lungs continuous.    polyethylene glycol powder (GLYCOLAX/MIRALAX) powder Take 17 g by mouth daily.  Refills: 1    SYMBICORT 160-4.5 MCG/ACT inhaler Inhale 2 puffs into the lungs every 12 (twelve) hours as needed (shortness of breath).  Refills: 2    albuterol (PROVENTIL) (2.5 MG/3ML) 0.083% nebulizer solution Take 3 mLs (2.5 mg total) by nebulization every 6 (six) hours as needed for wheezing or shortness of breath. Qty: 120 vial, Refills: 5    diltiazem (CARDIZEM) 10 mg/ml oral suspension Take 6 mLs (60 mg total) by mouth every 6 (six) hours. Qty: 1000 mL, Refills: 0    SPIRIVA HANDIHALER 18 MCG inhalation capsule PLACE 1 CAPSULE (18 MCG TOTAL) INTO INHALER AND INHALE DAILY. Qty: 30 capsule, Refills: 6      STOP taking these medications     aspirin EC 81 MG tablet      diltiazem (DILACOR XR) 120 MG 24 hr capsule      ipratropium-albuterol (DUONEB) 0.5-2.5 (3) MG/3ML SOLN      LORazepam  (ATIVAN) 0.5 MG tablet      magic mouthwash SOLN      morphine (ROXANOL) 20 MG/ML concentrated solution      ZETIA 10 MG tablet      albuterol (PROAIR HFA) 108 (90 BASE) MCG/ACT inhaler      diazepam (VALIUM) 2 MG tablet      HYDROcodone-homatropine (HYCODAN) 5-1.5 MG/5ML syrup      Morphine Sulfate (MORPHINE CONCENTRATE) 10 MG/0.5ML SOLN concentrated solution      Morphine Sulfate (MORPHINE CONCENTRATE) 10 MG/0.5ML SOLN concentrated solution      pantoprazole (PROTONIX) 40 MG tablet      thiamine 100 MG tablet        Follow-up Information    Call Tivis Ringer, MD.   Specialty:  Internal Medicine   Why:  As needed   Contact information:   [address] [place] [address] [phone]       TOTAL DISCHARGE TIME: 23 minutes  [facility] Pager [phone]  02/27/2015, 10:26 AM

## 2015-02-27 NOTE — Discharge Instructions (Signed)
Hospice °Hospice is a service that is designed to provide people who are terminally ill and their families with medical, spiritual, and psychological support. Its aim is to improve your quality of life by keeping you as alert and comfortable as possible. Hospice is performed by a team of health care professionals and volunteers who: °· Help keep you comfortable. Hospice can be provided in your home or in a homelike setting. The hospice staff works with your family and friends to help meet your needs. You will enjoy the support of loved ones by receiving much of your basic care from family and friends. °· Provide pain relief and manage your symptoms. The staff supply all necessary medicines and equipment. °· Provide companionship when you are alone. °· Allow you and your family to rest. They may do light housekeeping, prepare meals, and run errands. °· Provide counseling. They will make sure your emotional, spiritual, and social needs and those of your family are being met. °· Provide spiritual care. Spiritual care is individualized to meet your needs and your family's needs. It may involve helping you look at what death means to you, say goodbye, or perform a specific religious ceremony or ritual. °Hospice teams often include: °· A nurse. °· A doctor. °· Social workers. °· Religious leaders (such as a chaplain). °· Trained volunteers. °WHEN SHOULD HOSPICE CARE BEGIN? °Most people who use hospice are believed to have fewer than 6 months to live. Your family and health care providers can help you decide when hospice services should begin. If your condition improves, you may discontinue the program. °WHAT SHOULD I CONSIDER BEFORE SELECTING A PROGRAM? °Most hospice programs are run by nonprofit, independent organizations. Some are affiliated with hospitals, nursing homes, or home health care agencies. Hospice programs can take place in the home or at a hospice center, hospital, or skilled nursing facility. When choosing  a hospice program, ask the following questions: °· What services are available to me? °· What services are offered to my loved ones? °· How involved are my loved ones? °· How involved is my health care provider? °· Who makes up the hospice care team? How are they trained or screened? °· How will my pain and symptoms be managed? °· If my circumstances change, can the services be provided in a different setting, such as my home or in the hospital? °· Is the program reviewed and licensed by the state or certified in some other way? °WHERE CAN I LEARN MORE ABOUT HOSPICE? °You can learn about existing hospice programs in your area from your health care providers. You can also read more about hospice online. The websites of the following organizations contain helpful information: °· The National Hospice and Palliative Care Organization (NHPCO). °· The Hospice Association of America (HAA). °· The Hospice Education Institute. °· The American Cancer Society (ACS). °· Hospice Net. °  °This information is not intended to replace advice given to you by your health care provider. Make sure you discuss any questions you have with your health care provider. °  °Document Released: 08/27/2003 Document Revised: 05/15/2013 Document Reviewed: 03/20/2013 °Elsevier Interactive Patient Education ©2016 Elsevier Inc. ° °

## 2015-02-27 NOTE — Progress Notes (Addendum)
[facility] [place] [facility] RN Visit.  GIP related admission to [facility] DX: COPD and Lung CA: Pt. Is a DNR code status. Pt. sitting on the edge of the bed when writer arrived. Her daughter is at the bedside. Joneen Caraway LCSW present and advised family that pt. would be going to [facility] today. Pt's color better today. Resps 20-22. Pt. Advised that her Morphine infusion would be stopped and her pain regimen would be readdressed at [facility]. Pt. Reports nicotine patch placed this morning.  RN Please call report to [phone]. CSW please fax discharge summary to [phone].   [facility] Liaison [phone]

## 2015-02-27 NOTE — Progress Notes (Signed)
Daily Progress Note   Patient Name: [name]       Date: 02/27/2015 DOB: [birth year]  Age: 64 y.o. MRN#: [id number] Attending Physician: Bonnielee Haff, MD Primary Care Physician: Tivis Ringer, MD Admit Date: 02/25/2015  Reason for Consultation/Follow-up: Non pain symptom management  Subjective:  awake alert sitting by the edge of the bed. Thin frail lady, does appear to have mild dyspnea.   Interval Events: Now on morphine continuous infusion, will titrate according to symptoms.  To go to inpatient hospice when bed available.  Now with nicotine patch, re assured the patient that she still has tessalon pearles as well as mucinex ordered PRN.    Length of Stay: 2 days  Current Medications: Scheduled Meds:  . diltiazem  240 mg Oral Daily  . enoxaparin (LOVENOX) injection  20 mg Subcutaneous QHS  . feeding supplement (ENSURE ENLIVE)  237 mL Oral TID BM  . levalbuterol  1.25 mg Nebulization 4 times per day  . metoprolol  5 mg Intravenous Once  . nicotine  21 mg Transdermal Daily  . nitroGLYCERIN  0.5 inch Topical 4 times per day  . pantoprazole  40 mg Oral Daily  . polyethylene glycol  17 g Oral Daily  . predniSONE  60 mg Oral BID WC  . sodium chloride  3 mL Intravenous Q12H    Continuous Infusions: . sodium chloride 75 mL/hr at 02/26/15 1056  . morphine 2 mg/hr (02/26/15 2107)    PRN Meds: acetaminophen **OR** acetaminophen, benzonatate, bisacodyl, diltiazem, guaiFENesin, LORazepam, morphine injection, morphine, morphine CONCENTRATE, ondansetron **OR** ondansetron (ZOFRAN) IV  Palliative Performance Scale: 20%     Vital Signs: BP 127/67 mmHg  Pulse 116  Temp(Src) 97.6 F (36.4 C) (Oral)  Resp 22  Ht '5\' 7"'$  (1.702 m)  Wt 43.409 kg (95 lb 11.2 oz)  BMI 14.99 kg/m2  SpO2 99% SpO2: SpO2: 99 % O2 Device: O2 Device: Nasal Cannula O2 Flow Rate: O2 Flow Rate (L/min): 4 L/min  Intake/output summary:  Intake/Output Summary (Last 24 hours) at 02/27/15  0908 Last data filed at 02/27/15 0854  Gross per 24 hour  Intake   1077 ml  Output   1230 ml  Net   -153 ml   LBM:   Baseline Weight: Weight: 37.195 kg (82 lb) Most recent weight: Weight: 43.409 kg (95 lb 11.2 oz)  Physical Exam: Weak frail Diffuse wheezes posterior lung fields S1S2 Abdomen soft No edema Awake alert               Additional Data Reviewed: Recent Labs     02/25/15  1254  02/26/15  0312  WBC  8.8   --   HGB  12.1   --   PLT  177   --   NA  122*  129*  BUN  15  13  CREATININE  0.61  0.52     Problem List:  Patient Active Problem List   Diagnosis Date Noted  . Dyspnea   . Encounter for palliative care   . Hyponatremia 02/25/2015  . Atrial flutter ([place]) 02/25/2015  . Atrial tachycardia ([place]) 01/25/2015  . COPD with acute exacerbation ([place]) 01/25/2015  . Acute on chronic respiratory failure with hypoxia ([place]) 01/25/2015  . Palliative care encounter   . Oropharyngeal candidiasis 01/08/2015  . Headache disorder 09/24/2014  . Carotid stenosis 09/24/2014  . Acute and chronic respiratory failure with hypoxia ([place]) 06/23/2014  . Transient blindness of right eye 03/03/2014  . Chronic respiratory failure  with hypoxia ([place]) 07/20/2013  . Cardiomyopathy, ischemic - mild to moderate. EF ~45% 01/13/2013  . CAD (coronary artery disease), autologous vein bypass graft, cath 01/12/13 with Xience DES to VG-RCA 01/13/2013    Class: Acute  . Protein-calorie malnutrition, severe ([place]) 01/11/2013  . Acute myocardial infarction, unspecified site, episode of care unspecified   . Esophageal dysmotility   . Esophageal stricture   . Peripheral neuropathy ([place])   . C. difficile diarrhea   . PVD (peripheral vascular disease)- Rt SCA PTA   . Hypertension   . Hx of radiation therapy and chemo therapy   . 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 End stage COPD ([place]) 10/27/2011  . Hyperlipidemia 07/31/2007  . Anxiety state 07/31/2007  . Depression  07/31/2007  . GERD 07/31/2007  . PEPTIC ULCER DISEASE 07/31/2007  . GASTRITIS, CHRONIC 03/17/2006  . HIATAL HERNIA 03/17/2006  . POLYP, COLON 01/21/2005  . HEMORRHOIDS 01/21/2005     Palliative Care Assessment & Plan    Code Status:  DNR  Goals of Care:   comfort care  Inpatient hospice  Desire for further Chaplaincy support:no  3. Symptom Management:   morphine drip  4. Palliative Prophylaxis:  Stool Softener: yes  5. Prognosis: < 2 weeks  5. Discharge Planning: Hospice facility   Care plan was discussed with  Patient, bedside RN darla and daughter Terrence Dupont  Thank you for allowing the Palliative Medicine Team to assist in the care of this patient.   Time In: 0830 Time Out: 0905 Total Time 35  Prolonged Time Billed  no     Greater than 50%  of this time was spent counseling and coordinating care related to the above assessment and plan.  Fifi Schindler Burt Knack, MD  02/27/2015, 9:08 AM  [phone]  Please contact Palliative Medicine Team phone at [phone] for questions and concerns.

## 2015-02-27 NOTE — Progress Notes (Signed)
Pt going to [facility] today.  [place], [place] ext: [phone]

## 2015-02-27 NOTE — Progress Notes (Signed)
Remainder of Morphine drip was wasted with Waynetta Sandy, RN, ADON present to witness. Melford Aase, RN

## 2015-03-20 ENCOUNTER — Inpatient Hospital Stay (HOSPITAL_COMMUNITY)
Admission: EM | Admit: 2015-03-20 | Discharge: 2015-03-25 | DRG: 190 | Disposition: E | Attending: Internal Medicine | Admitting: Internal Medicine

## 2015-03-20 ENCOUNTER — Encounter (HOSPITAL_COMMUNITY): Payer: Self-pay | Admitting: Emergency Medicine

## 2015-03-20 ENCOUNTER — Ambulatory Visit: Payer: Medicaid Other | Admitting: Pulmonary Disease

## 2015-03-20 DIAGNOSIS — R238 Other skin changes: Secondary | ICD-10-CM | POA: Diagnosis present

## 2015-03-20 DIAGNOSIS — L8990 Pressure ulcer of unspecified site, unspecified stage: Secondary | ICD-10-CM | POA: Insufficient documentation

## 2015-03-20 DIAGNOSIS — Z515 Encounter for palliative care: Secondary | ICD-10-CM | POA: Diagnosis not present

## 2015-03-20 DIAGNOSIS — IMO0002 Reserved for concepts with insufficient information to code with codable children: Secondary | ICD-10-CM | POA: Diagnosis present

## 2015-03-20 DIAGNOSIS — F329 Major depressive disorder, single episode, unspecified: Secondary | ICD-10-CM | POA: Diagnosis present

## 2015-03-20 DIAGNOSIS — Z66 Do not resuscitate: Secondary | ICD-10-CM | POA: Diagnosis present

## 2015-03-20 DIAGNOSIS — J441 Chronic obstructive pulmonary disease with (acute) exacerbation: Principal | ICD-10-CM

## 2015-03-20 DIAGNOSIS — Z8673 Personal history of transient ischemic attack (TIA), and cerebral infarction without residual deficits: Secondary | ICD-10-CM

## 2015-03-20 DIAGNOSIS — Z808 Family history of malignant neoplasm of other organs or systems: Secondary | ICD-10-CM

## 2015-03-20 DIAGNOSIS — I739 Peripheral vascular disease, unspecified: Secondary | ICD-10-CM | POA: Diagnosis present

## 2015-03-20 DIAGNOSIS — Z9221 Personal history of antineoplastic chemotherapy: Secondary | ICD-10-CM

## 2015-03-20 DIAGNOSIS — G629 Polyneuropathy, unspecified: Secondary | ICD-10-CM | POA: Diagnosis present

## 2015-03-20 DIAGNOSIS — G8929 Other chronic pain: Secondary | ICD-10-CM | POA: Diagnosis not present

## 2015-03-20 DIAGNOSIS — I4891 Unspecified atrial fibrillation: Secondary | ICD-10-CM | POA: Diagnosis not present

## 2015-03-20 DIAGNOSIS — C3491 Malignant neoplasm of unspecified part of right bronchus or lung: Secondary | ICD-10-CM | POA: Diagnosis present

## 2015-03-20 DIAGNOSIS — J9620 Acute and chronic respiratory failure, unspecified whether with hypoxia or hypercapnia: Secondary | ICD-10-CM

## 2015-03-20 DIAGNOSIS — R451 Restlessness and agitation: Secondary | ICD-10-CM | POA: Diagnosis not present

## 2015-03-20 DIAGNOSIS — I2510 Atherosclerotic heart disease of native coronary artery without angina pectoris: Secondary | ICD-10-CM | POA: Diagnosis present

## 2015-03-20 DIAGNOSIS — Z8711 Personal history of peptic ulcer disease: Secondary | ICD-10-CM

## 2015-03-20 DIAGNOSIS — F17210 Nicotine dependence, cigarettes, uncomplicated: Secondary | ICD-10-CM | POA: Diagnosis present

## 2015-03-20 DIAGNOSIS — J45909 Unspecified asthma, uncomplicated: Secondary | ICD-10-CM | POA: Diagnosis present

## 2015-03-20 DIAGNOSIS — C801 Malignant (primary) neoplasm, unspecified: Secondary | ICD-10-CM

## 2015-03-20 DIAGNOSIS — Z7951 Long term (current) use of inhaled steroids: Secondary | ICD-10-CM

## 2015-03-20 DIAGNOSIS — F411 Generalized anxiety disorder: Secondary | ICD-10-CM | POA: Diagnosis present

## 2015-03-20 DIAGNOSIS — Z91048 Other nonmedicinal substance allergy status: Secondary | ICD-10-CM

## 2015-03-20 DIAGNOSIS — Z79899 Other long term (current) drug therapy: Secondary | ICD-10-CM

## 2015-03-20 DIAGNOSIS — J9621 Acute and chronic respiratory failure with hypoxia: Secondary | ICD-10-CM | POA: Diagnosis present

## 2015-03-20 DIAGNOSIS — E785 Hyperlipidemia, unspecified: Secondary | ICD-10-CM | POA: Diagnosis present

## 2015-03-20 DIAGNOSIS — Z885 Allergy status to narcotic agent status: Secondary | ICD-10-CM

## 2015-03-20 DIAGNOSIS — E43 Unspecified severe protein-calorie malnutrition: Secondary | ICD-10-CM

## 2015-03-20 DIAGNOSIS — Z888 Allergy status to other drugs, medicaments and biological substances status: Secondary | ICD-10-CM

## 2015-03-20 DIAGNOSIS — I509 Heart failure, unspecified: Secondary | ICD-10-CM | POA: Diagnosis present

## 2015-03-20 DIAGNOSIS — K219 Gastro-esophageal reflux disease without esophagitis: Secondary | ICD-10-CM | POA: Diagnosis present

## 2015-03-20 DIAGNOSIS — J449 Chronic obstructive pulmonary disease, unspecified: Secondary | ICD-10-CM | POA: Diagnosis not present

## 2015-03-20 DIAGNOSIS — Z9981 Dependence on supplemental oxygen: Secondary | ICD-10-CM

## 2015-03-20 DIAGNOSIS — R0600 Dyspnea, unspecified: Secondary | ICD-10-CM | POA: Diagnosis not present

## 2015-03-20 DIAGNOSIS — Z955 Presence of coronary angioplasty implant and graft: Secondary | ICD-10-CM

## 2015-03-20 DIAGNOSIS — I110 Hypertensive heart disease with heart failure: Secondary | ICD-10-CM | POA: Diagnosis present

## 2015-03-20 DIAGNOSIS — K224 Dyskinesia of esophagus: Secondary | ICD-10-CM | POA: Diagnosis present

## 2015-03-20 DIAGNOSIS — Z8249 Family history of ischemic heart disease and other diseases of the circulatory system: Secondary | ICD-10-CM

## 2015-03-20 DIAGNOSIS — I252 Old myocardial infarction: Secondary | ICD-10-CM

## 2015-03-20 DIAGNOSIS — R64 Cachexia: Secondary | ICD-10-CM | POA: Diagnosis present

## 2015-03-20 DIAGNOSIS — Z825 Family history of asthma and other chronic lower respiratory diseases: Secondary | ICD-10-CM

## 2015-03-20 DIAGNOSIS — Z85118 Personal history of other malignant neoplasm of bronchus and lung: Secondary | ICD-10-CM

## 2015-03-20 DIAGNOSIS — J9690 Respiratory failure, unspecified, unspecified whether with hypoxia or hypercapnia: Secondary | ICD-10-CM | POA: Diagnosis present

## 2015-03-20 DIAGNOSIS — Z923 Personal history of irradiation: Secondary | ICD-10-CM

## 2015-03-20 DIAGNOSIS — Z951 Presence of aortocoronary bypass graft: Secondary | ICD-10-CM

## 2015-03-20 MED ORDER — MORPHINE SULFATE (PF) 10 MG/ML IV SOLN: 10.0000 mg | INTRAVENOUS | Status: DC | PRN

## 2015-03-20 MED ORDER — IPRATROPIUM-ALBUTEROL 0.5-2.5 (3) MG/3ML IN SOLN: 3.0000 mL | RESPIRATORY_TRACT | Status: DC

## 2015-03-20 MED ORDER — GLYCOPYRROLATE 1 MG PO TABS
1.0000 mg | ORAL_TABLET | ORAL | Status: DC | PRN
  Filled 2015-03-20: qty 1

## 2015-03-20 MED ORDER — GLYCOPYRROLATE 0.2 MG/ML IJ SOLN: 0.2000 mg | INTRAMUSCULAR | Status: DC | PRN

## 2015-03-20 MED ORDER — HALOPERIDOL LACTATE 2 MG/ML PO CONC
0.5000 mg | ORAL | Status: DC | PRN
  Filled 2015-03-20: qty 0.3

## 2015-03-20 MED ORDER — HALOPERIDOL LACTATE 5 MG/ML IJ SOLN
0.5000 mg | INTRAMUSCULAR | Status: DC | PRN
  Administered 2015-03-21: 0.5 mg via INTRAVENOUS
  Filled 2015-03-20: qty 1

## 2015-03-20 MED ORDER — MORPHINE SULFATE (PF) 10 MG/ML IV SOLN
10.0000 mg | Freq: Once | INTRAVENOUS | Status: AC
  Administered 2015-03-20: 10 mg via INTRAVENOUS
  Filled 2015-03-20: qty 1

## 2015-03-20 MED ORDER — BISACODYL 10 MG RE SUPP: 10.0000 mg | Freq: Every day | RECTAL | Status: DC | PRN

## 2015-03-20 MED ORDER — LORAZEPAM 2 MG/ML IJ SOLN
1.0000 mg | INTRAMUSCULAR | Status: DC | PRN
  Administered 2015-03-20 – 2015-03-22 (×3): 1 mg via INTRAVENOUS
  Filled 2015-03-20 (×3): qty 1

## 2015-03-20 MED ORDER — SODIUM CHLORIDE 0.9 % IV SOLN
10.0000 mg/h | INTRAVENOUS | Status: DC
  Administered 2015-03-20 – 2015-03-22 (×3): 10 mg/h via INTRAVENOUS
  Filled 2015-03-20 (×3): qty 10

## 2015-03-20 MED ORDER — MORPHINE SULFATE (CONCENTRATE) 10 MG/0.5ML PO SOLN: 10.0000 mg | ORAL | Status: DC | PRN

## 2015-03-20 MED ORDER — HALOPERIDOL 1 MG PO TABS
0.5000 mg | ORAL_TABLET | ORAL | Status: DC | PRN
Start: 2015-03-20 — End: 2015-03-21

## 2015-03-20 MED ORDER — IPRATROPIUM-ALBUTEROL 0.5-2.5 (3) MG/3ML IN SOLN: 3.0000 mL | RESPIRATORY_TRACT | Status: DC | PRN

## 2015-03-20 MED ORDER — HEPARIN SODIUM (PORCINE) 5000 UNIT/ML IJ SOLN: 5000.0000 [IU] | Freq: Three times a day (TID) | INTRAMUSCULAR | Status: DC

## 2015-03-20 MED ORDER — ONDANSETRON HCL 4 MG PO TABS: 4.0000 mg | ORAL_TABLET | Freq: Four times a day (QID) | ORAL | Status: DC | PRN

## 2015-03-20 MED ORDER — GLYCOPYRROLATE 0.2 MG/ML IJ SOLN
0.2000 mg | INTRAMUSCULAR | Status: DC | PRN
  Administered 2015-03-21 – 2015-03-23 (×3): 0.2 mg via INTRAVENOUS
  Filled 2015-03-20 (×3): qty 1

## 2015-03-20 MED ORDER — ONDANSETRON HCL 4 MG/2ML IJ SOLN: 4.0000 mg | Freq: Four times a day (QID) | INTRAMUSCULAR | Status: DC | PRN

## 2015-03-20 MED ORDER — KETOROLAC TROMETHAMINE 30 MG/ML IJ SOLN
30.0000 mg | Freq: Once | INTRAMUSCULAR | Status: AC
  Administered 2015-03-20: 30 mg via INTRAVENOUS
  Filled 2015-03-20: qty 1

## 2015-03-20 NOTE — H&P (Signed)
[facility] History and Physical  [name] JXB:[id number] DOB: 09-30-1950 DOA: 03/19/2015  Referring physician: ED PCP: Tivis Ringer, MD   Chief Complaint: Shortness of Breath  HPI:  Patient is a 64 year old female with PMH of stage III NSCLC, end-stage COPD, oxygen dependence, chronic pain, followed by hospice of [facility]; who presented to the emergency department with shortness of breath from home. Patient has had multiple hospitalizations in the last 3 months. Most recent was 10/04-10/06 and discharged with residential hospice. Patient declines to have any interventions or lab studies and requests only that we treat her shortness of breath and her pain. Patient complains of extreme pain in her lower back, chest, and face. She reports blisters of the lower extremities bilaterally which have been weeping constantly. Patient has been to the inpatient hospice facility known as [facility] twice in the past.  Review of Systems  Constitutional: Positive for weight loss and malaise/fatigue.  HENT: Positive for congestion. Negative for nosebleeds.   Eyes: Negative for pain and redness.  Respiratory: Positive for cough, sputum production, shortness of breath and wheezing.   Cardiovascular: Positive for chest pain and leg swelling.  Genitourinary: Positive for frequency and hematuria.  Skin: Positive for rash.  Neurological: Positive for weakness.  Endo/Heme/Allergies: Negative for environmental allergies. Bruises/bleeds easily.  Psychiatric/Behavioral: The patient has insomnia.     Past Medical History  Diagnosis Date  . Acute myocardial infarction, unspecified site, episode of care unspecified 2011    VF arrest. Ruled in, cath with no clear culprit lesion, inferior WMA on LV gram, medical therapy  . Esophageal dysmotility   . Hemorrhoids   . Esophageal stricture   . Diaphragmatic hernia without mention of obstruction or gangrene   . 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 CAD (coronary artery disease)     CABG'96., Xience DES -VG-RCA (01/12/13)  . Peripheral neuropathy ([place])   . Hyperlipidemia   . Peptic ulcer, unspecified site, unspecified as acute or chronic, without mention of hemorrhage, perforation, or obstruction   . Depressive disorder, not elsewhere classified   . Anxiety state, unspecified   . COPD (chronic obstructive pulmonary disease) ([place])   . GERD (gastroesophageal reflux disease)   . Candida esophagitis ([place])     on multiple occasions  . C. difficile diarrhea   . PVD (peripheral vascular disease) ([place])   . Headache(784.0)   . Weight loss 06/13/2011    scheduled here by family doctor  . CHF (congestive heart failure) ([place]) 2011  . Asthma   . Hypertension   . History of nuclear stress test 07/14/2010    dipyriadmole; normal pattern of perfusion, low risk   . History of tobacco use     quit 05/2009  . Non-small cell carcinoma of lung, stage 3 (HCC)     Dr. Roxan Hockey  . History of radiation therapy 02/28/2012-04/14/2012    right lung  . Bilateral carotid artery disease ([place])   . TIA (transient ischemic attack)     amaurosis fugax  . Tubular adenoma of colon   . On home oxygen therapy     "4L; 24/7" (01/08/2015)  . Atrial fibrillation [facility])      Past Surgical History  Procedure Laterality Date  . Tonsillectomy  1974  . Mandible fracture surgery Left 1976  . Breast surgery Bilateral 1969, 1970    "tumors"  . Epigastric hernia repair  2008    Dr Roxan Hockey  . Coronary artery bypass graft  02/01/1994  left internal mammary to LAD, vein graft to diagonal/post descending, post lateral, marginals, circumfle, diagonal  . Carpal tunnel release    . Laminectomy    . Subclavian vein angioplasty / stenting Left   . Transthoracic echocardiogram  08/14/2009    EF=>55%, normal LV systolic function, mild hypokinesis of basal segments of inferolateral & anterolater walls; mild MR/TR; AV mildly sclerotic  . Coronary  angioplasty with stent placement  08/29/2000    stent to RCA (Dr. Corky Downs)  . Cardiac catheterization  01/17/2002    patent grafts (Dr. Marella Chimes)  . Cardiac catheterization  02/13/2004    significant L main & 3 vessel CAD, patents grafts, mild distal aortic disease (Dr. Gerrie Nordmann)  . Cardiac catheterization  07/14/2005    patent grafts, normal LV function (Dr. Adora Fridge)  . Cardiac catheterization  09/26/2008    patent grafts, normal LV function (Dr. Adora Fridge)  . Cardiac catheterization  05/31/2009    patent grafts, inferior wall motion abnormality (Dr. Adora Fridge)  . Cardiac catheterization  10/03/2009    normal LV function; 100% native RCA, circumflex, distal LAD occlusion  . Coronary angioplasty with stent placement  01/12/2013    Xience DES to SVG-RCA distal  . Carotid doppler  08/14/2009    R & L ICAs - 0-49% diameter reduction   . Left heart catheterization with coronary/graft angiogram N/A 01/12/2013    Procedure: LEFT HEART CATHETERIZATION WITH Beatrix Fetters;  Surgeon: Leonie Man, MD;  Location: [facility] CATH LAB;  Service: Cardiovascular;  Laterality: N/A;  . Vaginal hysterectomy  1998      Social History:  reports that she has been smoking Cigarettes.  She has a 45 pack-year smoking history. She has never used smokeless tobacco. She reports that she uses illicit drugs (Flunitrazepam). She reports that she does not drink alcohol. Where does patient live--home  and with whom if at home? Family in hospitce care. Can patient participate in ADLs? no  Allergies  Allergen Reactions  . Amiodarone Shortness Of Breath and Other (See Comments)    Drops HR too low, fatigue  . Clopidogrel Bisulfate Hives  . Dilaudid [Hydromorphone Hcl] Other (See Comments)    "I couldn't function for 2 days." lethargy  . Multaq [Dronedarone] Other (See Comments)    Heart races   . Tape Itching and Rash    Tears skin.  Please use "paper" tape    Family History  Problem Relation Age of Onset  .  Lung cancer Father     also CVA  . Heart disease Father   . Asthma Father   . Allergies Father   . Cancer Father   . Heart disease Mother     also CVA, MI  . Brain cancer Mother   . Heart disease Sister   . Colon cancer Neg Hx   . Lung cancer Brother 3    also ENT cancer  . Hypertension Brother 67    also aneurysm, early CAD  . Breast cancer Other     family hx     Prior to Admission medications   Medication Sig Start Date End Date Taking? Authorizing Provider  albuterol (PROVENTIL) (2.5 MG/3ML) 0.083% nebulizer solution Take 3 mLs (2.5 mg total) by nebulization every 6 (six) hours as needed for wheezing or shortness of breath. Patient not taking: Reported on 02/25/2015 01/10/15   Kinnie Feil, MD  benzonatate (TESSALON) 100 MG capsule Take 100 mg by mouth 3 (three) times daily as needed  for cough.  01/15/15   Historical Provider, MD  [employer] ARTHRITIS PAIN RELIEF 650 MG CR tablet Take 650 mg by mouth every 8 (eight) hours as needed for pain.  01/15/15   Historical Provider, MD  [employer] BISACODYL 5 MG EC tablet Take 10 mg by mouth daily as needed. 02/12/15   Historical Provider, MD  diltiazem (CARDIZEM) 10 mg/ml oral suspension Take 6 mLs (60 mg total) by mouth every 6 (six) hours. Patient not taking: Reported on 02/25/2015 01/28/15   Geradine Girt, DO  famotidine (PEPCID) 20 MG tablet Take 1 tablet (20 mg total) by mouth at bedtime. 12/11/14   Lafayette Dragon, MD  feeding supplement, ENSURE ENLIVE, (ENSURE ENLIVE) LIQD Take 237 mLs by mouth 3 (three) times daily between meals. 01/28/15   Geradine Girt, DO  fluconazole (DIFLUCAN) 100 MG tablet Take 1 tablet (100 mg total) by mouth daily. 01/10/15   Kinnie Feil, MD  furosemide (LASIX) 20 MG tablet Take 20 mg by mouth daily.    Historical Provider, MD  LORazepam (ATIVAN) 2 MG/ML injection Inject 0.25 mLs (0.5 mg total) into the vein every 2 (two) hours as needed (anxiety). 02/27/15   Bonnielee Haff, MD  morphine 250 mg in dextrose 5 % 250 mL  Inject 2 mg/hr into the vein continuous. 02/27/15   Bonnielee Haff, MD  morphine 5 mg/mL SOLN Inject 2 mg into the vein every hour as needed (or dyspnea). 02/27/15   Bonnielee Haff, MD  MUCINEX 600 MG 12 hr tablet Take 1,200 mg by mouth every 12 (twelve) hours as needed for cough.  01/15/15   Historical Provider, MD  nicotine (NICODERM CQ - DOSED IN MG/24 HOURS) 21 mg/24hr patch Place 1 patch (21 mg total) onto the skin daily. 02/27/15   Bonnielee Haff, MD  nitroGLYCERIN (NITROGLYN) 2 % ointment Apply 0.5 inches topically every 6 (six) hours. 02/27/15   Bonnielee Haff, MD  omeprazole (PRILOSEC) 40 MG capsule Take 40 mg by mouth daily. 02/19/15   Historical Provider, MD  ondansetron (ZOFRAN) 4 MG tablet Take 4 mg by mouth every 8 (eight) hours as needed for nausea or vomiting.  02/14/15   Historical Provider, MD  OXYGEN-HELIUM IN Inhale 4 L into the lungs continuous.    Historical Provider, MD  polyethylene glycol powder (GLYCOLAX/MIRALAX) powder Take 17 g by mouth daily.  02/12/15   Historical Provider, MD  predniSONE (DELTASONE) 20 MG tablet Take 3 tablets (60 mg total) by mouth 2 (two) times daily with a meal. 02/27/15   Bonnielee Haff, MD  SPIRIVA HANDIHALER 18 MCG inhalation capsule PLACE 1 CAPSULE (18 MCG TOTAL) INTO INHALER AND INHALE DAILY. Patient not taking: Reported on 02/25/2015 10/28/14   Noralee Space, MD  SYMBICORT 160-4.5 MCG/ACT inhaler Inhale 2 puffs into the lungs every 12 (twelve) hours as needed (shortness of breath).  02/19/15   Historical Provider, MD     Physical Exam: Filed Vitals:   03/05/2015 0926  BP: 147/86  Pulse: 155  Temp: 97.7 F (36.5 C)  TempSrc: Oral  Resp: 30  SpO2: 100%     Physical Exam  Constitutional: She is oriented to person, place, and time. She appears lethargic. She appears cachectic. She appears ill. She appears distressed.  HENT:  Head: Normocephalic and atraumatic.  Right Ear: Hearing normal.  Left Ear: Hearing normal.  Nose: Nose normal.  Eyes:  Conjunctivae, EOM and lids are normal. Pupils are equal, round, and reactive to light.  Cardiovascular: An irregularly irregular  rhythm present. Tachycardia present.   Respiratory: She is in respiratory distress (moderate). She has decreased breath sounds. She has wheezes. She has rhonchi. She has rales.  GI: Soft. Normal appearance. Bowel sounds are decreased. There is no tenderness.  Musculoskeletal:       Lumbar back: She exhibits decreased range of motion and tenderness.  Neurological: She is oriented to person, place, and time. She appears lethargic.  Skin: Lesion and rash noted. Rash is vesicular (Lesions of the bilateral lower extremities some have erupted and have left ulcerated areas that are weeping serosanguineous fluid. D  emarcated by image). She is not diaphoretic.     Psychiatric: Her speech is normal. Judgment and thought content normal. Her affect is blunt. She is slowed and withdrawn. Cognition and memory are normal.     Data Review   Micro Results No results found for this or any previous visit (from the past 240 hour(s)).  Radiology Reports Dg Chest Portable 1 View  02/25/2015  CLINICAL DATA:  Shortness of breath. EXAM: PORTABLE CHEST 1 VIEW COMPARISON:  CT scan and chest x-ray dated 01/25/2015 and chest x-rays dated 01/09/2015 and 06/23/2014 FINDINGS: The lungs are hyperinflated with flattening of the diaphragm. Slight scarring at the right base, stable. Heart size and pulmonary vascularity are normal. No infiltrates or effusions. No pneumothorax. No acute osseous abnormality. Previous CABG. IMPRESSION: Emphysema.  No acute abnormalities. Electronically Signed   By: Lorriane Shire M.D.   On: 02/25/2015 13:37     CBC No results for input(s): WBC, HGB, HCT, PLT, MCV, MCH, MCHC, RDW, LYMPHSABS, MONOABS, EOSABS, BASOSABS, BANDABS in the last 168 hours.  Invalid input(s): NEUTRABS, BANDSABD  Chemistries  No results for input(s): NA, K, CL, CO2, GLUCOSE, BUN,  CREATININE, CALCIUM, MG, AST, ALT, ALKPHOS, BILITOT in the last 168 hours.  Invalid input(s): GFRCGP ------------------------------------------------------------------------------------------------------------------ CrCl cannot be calculated (Unknown ideal weight.). ------------------------------------------------------------------------------------------------------------------ No results for input(s): HGBA1C in the last 72 hours. ------------------------------------------------------------------------------------------------------------------ No results for input(s): CHOL, HDL, LDLCALC, TRIG, CHOLHDL, LDLDIRECT in the last 72 hours. ------------------------------------------------------------------------------------------------------------------ No results for input(s): TSH, T4TOTAL, T3FREE, THYROIDAB in the last 72 hours.  Invalid input(s): FREET3 ------------------------------------------------------------------------------------------------------------------ No results for input(s): VITAMINB12, FOLATE, FERRITIN, TIBC, IRON, RETICCTPCT in the last 72 hours.  Coagulation profile No results for input(s): INR, PROTIME in the last 168 hours.  No results for input(s): DDIMER in the last 72 hours.  Cardiac Enzymes No results for input(s): CKMB, TROPONINI, MYOGLOBIN in the last 168 hours.  Invalid input(s): CK ------------------------------------------------------------------------------------------------------------------ Invalid input(s): POCBNP   CBG: No results for input(s): GLUCAP in the last 168 hours.     EKG: Independently reviewed. 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 Family unable to manage patient at home and patient is requesting only comfort care measures. Palliative care has been notified. Will work with social work currently in order to try and get the patient into an inpatient hospice facility. We will continue breathing treatments and pain control as we try and  assist her during this time.  Code Status:   full Family Communication: bedside Disposition Plan: admit   Total time spent 55 minutes.Greater than 50% of this time was spent in counseling, explanation of diagnosis, planning of further management, and coordination of care  [facility] Pager [phone]  If 7PM-7AM, please contact night-coverage [email or web address] Password [email or web address] 02/22/2015, 10:26 AM

## 2015-03-20 NOTE — Progress Notes (Addendum)
11:08 AM Spoke with [name] regarding patient status and hospice RN to come and see patient to define disposition for patient and family. No family at the bedside other than a grandson. No real reason to admit patient as she is here for pain control and shortness of breath. Harmon Pier is calling palliative RN again to devise plan and LCSW will be here to assist with patient and family regarding disposition. Patient has been in [facility] 2 other times and has hospice IN the home at this time.  LCSW made aware of consult for palliative/comfort care. Call placed to [name] who is aware of patient.  Harmon Pier is calling hospice RN to notify patient is currently in [facility] ED. Spoke with [facility] with regard to plan.  Will await hospice RN to call back with regards to disposition of patient.  LCSW will continue to follow.  Lane Hacker, MSW Clinical Social Work: Emergency Room [phone]

## 2015-03-20 NOTE — Progress Notes (Signed)
[facility]-D [facility]-RN Visit-Stacie Auburn Bilberry, BSN  Patient seen in room with grandson, Doren Custard at bedside.  Patient appears in distress with noted use of accessory muscles.  She is diaphoretic. She stated she is ready to "go home" spiritually speaking.  She stated she does not want to die in the home, due to children being present.  She is currently awaiting daughter to arrive to the hospital.  Discussed comfort measures and patient would like to be admitted for EOL care with continuous Morphine to assist with pain and current work of breathing. She stated she is currently in pain after receiving 10 mg Roxanol.  Spoke with Dr. Colin Rhein regarding patient's status, and plan is to admit to inpatient as patient is in acute distress and actively dying. [facility] will continue to follow and consider [facility] pending bed availability and stability of patient to transfer.    [facility] social worker, Ollen Gross made aware and plans to visit patient and family today.  Please call with any questions or concerns.  Thank you, Freddi Starr RN, [facility] Liaison [phone]

## 2015-03-20 NOTE — ED Provider Notes (Signed)
CSN: [id number]     Arrival date & time 03/01/2015  0919 History   First MD Initiated Contact with Patient 03/06/2015 0919     Chief Complaint  Patient presents with  . Shortness of Breath     (Consider location/radiation/quality/duration/timing/severity/associated sxs/prior Treatment) Patient is a 64 y.o. female presenting with shortness of breath.  Shortness of Breath Severity:  Severe Onset quality:  Gradual Timing:  Constant Progression:  Worsening Chronicity:  Chronic Context comment:  Hospice pt, end stage COPD Relieved by:  Nothing Worsened by:  Nothing tried Associated symptoms: chest pain   Associated symptoms: no abdominal pain     Past Medical History  Diagnosis Date  . Acute myocardial infarction, unspecified site, episode of care unspecified 2011    VF arrest. Ruled in, cath with no clear culprit lesion, inferior WMA on LV gram, medical therapy  . Esophageal dysmotility   . Hemorrhoids   . Esophageal stricture   . Diaphragmatic hernia without mention of obstruction or gangrene   . 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 CAD (coronary artery disease)     CABG'96., Xience DES -VG-RCA (01/12/13)  . Peripheral neuropathy ([place])   . Hyperlipidemia   . Peptic ulcer, unspecified site, unspecified as acute or chronic, without mention of hemorrhage, perforation, or obstruction   . Depressive disorder, not elsewhere classified   . Anxiety state, unspecified   . COPD (chronic obstructive pulmonary disease) ([place])   . GERD (gastroesophageal reflux disease)   . Candida esophagitis ([place])     on multiple occasions  . C. difficile diarrhea   . PVD (peripheral vascular disease) ([place])   . Headache(784.0)   . Weight loss 06/13/2011    scheduled here by family doctor  . CHF (congestive heart failure) ([place]) 2011  . Asthma   . Hypertension   . History of nuclear stress test 07/14/2010    dipyriadmole; normal pattern of perfusion, low risk   . History of tobacco use      quit 05/2009  . Non-small cell carcinoma of lung, stage 3 (H[facility])     Dr. Roxan Hockey  . History of radiation therapy 02/28/2012-04/14/2012    right lung  . Bilateral carotid artery disease ([place])   . TIA (transient ischemic attack)     amaurosis fugax  . Tubular adenoma of colon   . On home oxygen therapy     "4L; 24/7" (01/08/2015)  . Atrial fibrillation [facility])    Past Surgical History  Procedure Laterality Date  . Tonsillectomy  1974  . Mandible fracture surgery Left 1976  . Breast surgery Bilateral 1969, 1970    "tumors"  . Epigastric hernia repair  2008    Dr Roxan Hockey  . Coronary artery bypass graft  02/01/1994    left internal mammary to LAD, vein graft to diagonal/post descending, post lateral, marginals, circumfle, diagonal  . Carpal tunnel release    . Laminectomy    . Subclavian vein angioplasty / stenting Left   . Transthoracic echocardiogram  08/14/2009    EF=>55%, normal LV systolic function, mild hypokinesis of basal segments of inferolateral & anterolater walls; mild MR/TR; AV mildly sclerotic  . Coronary angioplasty with stent placement  08/29/2000    stent to RCA (Dr. Corky Downs)  . Cardiac catheterization  01/17/2002    patent grafts (Dr. Marella Chimes)  . Cardiac catheterization  02/13/2004    significant L main & 3 vessel CAD, patents grafts, mild distal aortic disease (Dr. Gerrie Nordmann)  .  Cardiac catheterization  07/14/2005    patent grafts, normal LV function (Dr. Adora Fridge)  . Cardiac catheterization  09/26/2008    patent grafts, normal LV function (Dr. Adora Fridge)  . Cardiac catheterization  05/31/2009    patent grafts, inferior wall motion abnormality (Dr. Adora Fridge)  . Cardiac catheterization  10/03/2009    normal LV function; 100% native RCA, circumflex, distal LAD occlusion  . Coronary angioplasty with stent placement  01/12/2013    Xience DES to SVG-RCA distal  . Carotid doppler  08/14/2009    R & L ICAs - 0-49% diameter reduction   . Left heart catheterization  with coronary/graft angiogram N/A 01/12/2013    Procedure: LEFT HEART CATHETERIZATION WITH Beatrix Fetters;  Surgeon: Leonie Man, MD;  Location: [facility] CATH LAB;  Service: Cardiovascular;  Laterality: N/A;  . Vaginal hysterectomy  1998   Family History  Problem Relation Age of Onset  . Lung cancer Father     also CVA  . Heart disease Father   . Asthma Father   . Allergies Father   . Cancer Father   . Heart disease Mother     also CVA, MI  . Brain cancer Mother   . Heart disease Sister   . Colon cancer Neg Hx   . Lung cancer Brother 83    also ENT cancer  . Hypertension Brother 45    also aneurysm, early CAD  . Breast cancer Other     family hx   Social History  Substance Use Topics  . Smoking status: Current Every Day Smoker -- 1.50 packs/day for 30 years    Types: Cigarettes  . Smokeless tobacco: Never Used  . Alcohol Use: No   OB History    No data available     Review of Systems  Respiratory: Positive for shortness of breath.   Cardiovascular: Positive for chest pain.  Gastrointestinal: Negative for abdominal pain.  All other systems reviewed and are negative.     Allergies  Amiodarone; Clopidogrel bisulfate; Dilaudid; Multaq; and Tape  Home Medications   Prior to Admission medications   Medication Sig Start Date End Date Taking? Authorizing Provider  albuterol (PROVENTIL) (2.5 MG/3ML) 0.083% nebulizer solution Take 3 mLs (2.5 mg total) by nebulization every 6 (six) hours as needed for wheezing or shortness of breath. Patient not taking: Reported on 02/25/2015 01/10/15   Kinnie Feil, MD  benzonatate (TESSALON) 100 MG capsule Take 100 mg by mouth 3 (three) times daily as needed for cough.  01/15/15   Historical Provider, MD  [employer] ARTHRITIS PAIN RELIEF 650 MG CR tablet Take 650 mg by mouth every 8 (eight) hours as needed for pain.  01/15/15   Historical Provider, MD  [employer] BISACODYL 5 MG EC tablet Take 10 mg by mouth daily as needed. 02/12/15   Historical  Provider, MD  diltiazem (CARDIZEM) 10 mg/ml oral suspension Take 6 mLs (60 mg total) by mouth every 6 (six) hours. Patient not taking: Reported on 02/25/2015 01/28/15   Geradine Girt, DO  famotidine (PEPCID) 20 MG tablet Take 1 tablet (20 mg total) by mouth at bedtime. 12/11/14   Lafayette Dragon, MD  feeding supplement, ENSURE ENLIVE, (ENSURE ENLIVE) LIQD Take 237 mLs by mouth 3 (three) times daily between meals. 01/28/15   Geradine Girt, DO  fluconazole (DIFLUCAN) 100 MG tablet Take 1 tablet (100 mg total) by mouth daily. 01/10/15   Kinnie Feil, MD  furosemide (LASIX) 20 MG tablet Take  20 mg by mouth daily.    Historical Provider, MD  LORazepam (ATIVAN) 2 MG/ML injection Inject 0.25 mLs (0.5 mg total) into the vein every 2 (two) hours as needed (anxiety). 02/27/15   Bonnielee Haff, MD  morphine 250 mg in dextrose 5 % 250 mL Inject 2 mg/hr into the vein continuous. 02/27/15   Bonnielee Haff, MD  morphine 5 mg/mL SOLN Inject 2 mg into the vein every hour as needed (or dyspnea). 02/27/15   Bonnielee Haff, MD  MUCINEX 600 MG 12 hr tablet Take 1,200 mg by mouth every 12 (twelve) hours as needed for cough.  01/15/15   Historical Provider, MD  nicotine (NICODERM CQ - DOSED IN MG/24 HOURS) 21 mg/24hr patch Place 1 patch (21 mg total) onto the skin daily. 02/27/15   Bonnielee Haff, MD  nitroGLYCERIN (NITROGLYN) 2 % ointment Apply 0.5 inches topically every 6 (six) hours. 02/27/15   Bonnielee Haff, MD  omeprazole (PRILOSEC) 40 MG capsule Take 40 mg by mouth daily. 02/19/15   Historical Provider, MD  ondansetron (ZOFRAN) 4 MG tablet Take 4 mg by mouth every 8 (eight) hours as needed for nausea or vomiting.  02/14/15   Historical Provider, MD  OXYGEN-HELIUM IN Inhale 4 L into the lungs continuous.    Historical Provider, MD  polyethylene glycol powder (GLYCOLAX/MIRALAX) powder Take 17 g by mouth daily.  02/12/15   Historical Provider, MD  predniSONE (DELTASONE) 20 MG tablet Take 3 tablets (60 mg total) by mouth 2 (two)  times daily with a meal. 02/27/15   Bonnielee Haff, MD  SPIRIVA HANDIHALER 18 MCG inhalation capsule PLACE 1 CAPSULE (18 MCG TOTAL) INTO INHALER AND INHALE DAILY. Patient not taking: Reported on 02/25/2015 10/28/14   Noralee Space, MD  SYMBICORT 160-4.5 MCG/ACT inhaler Inhale 2 puffs into the lungs every 12 (twelve) hours as needed (shortness of breath).  02/19/15   Historical Provider, MD   BP 147/86 mmHg  Pulse 155  Temp(Src) 97.7 F (36.5 C) (Oral)  Resp 30  SpO2 100% Physical Exam  Constitutional: She is oriented to person, place, and time. She appears well-developed. She appears distressed.  HENT:  Head: Normocephalic and atraumatic.  Right Ear: External ear normal.  Left Ear: External ear normal.  Eyes: Conjunctivae and EOM are normal. Pupils are equal, round, and reactive to light.  Neck: Normal range of motion. Neck supple.  Cardiovascular: Normal heart sounds and intact distal pulses.  An irregular rhythm present. Tachycardia present.   Pulmonary/Chest: Accessory muscle usage present. Tachypnea noted. She is in respiratory distress. She has rales.  Abdominal: Soft. Bowel sounds are normal. There is no tenderness.  Musculoskeletal: Normal range of motion.  Neurological: She is alert and oriented to person, place, and time.  Skin: Skin is warm. She is diaphoretic.  Vitals reviewed.   ED Course  Procedures (including critical care time) Labs Review Labs Reviewed - No data to display  Imaging Review No results found. I have personally reviewed and evaluated these images and lab results as part of my medical decision-making.   EKG Interpretation None      MDM   Final diagnoses:  None    64 y.o. female with pertinent PMH of COPD, CAD, CHF, st 3 nonsmall cell carcinoma of lung presents with recurrent acute on chronic dyspnea.  Seen earlier this month for same.  I had a detailed discussion with the pt on her arrival re: goals of care.  Pt verified that she is DNR/DNI.  I  asked her if she would like to be placed on bipap or have further workup for her symptoms, and the pt declined.  I offered comfort care measures including pain medication and medication for air hunger and the pt requested this be the plan of care. I had this discussion on 3 separate occasions, first with the pt alone, then with her son, then with her HCPOA over the phone.  We agreed to pursue comfort care measures only.  POA requested that pt not die at home due to family being around.  Consulted palliative, hospitalist, and social work.  I have reviewed all laboratory and imaging studies if ordered as above  No diagnosis found.      Debby Freiberg, MD 03/02/2015 [phone]

## 2015-03-20 NOTE — Progress Notes (Signed)
   03/03/2015 1008  Clinical Encounter Type  Visited With Patient and family together;Health care provider  Visit Type Initial;Patient actively dying  Referral From Nurse   Chaplain responded to a patient actively dying in the ED. Chaplain introduced spiritual care services and offered support. Patient declined support at this time. Chaplains available as needed.   Jeri Lager, Chaplain 02/23/2015 10:09 AM

## 2015-03-20 NOTE — ED Notes (Signed)
From home via [facility] for resp distress, 2 grams mag, 2 duo nebs, '125mg'$  solumedrol, CPAP briefly (poorly tolerated), obvious distress on arrival, DNR

## 2015-03-20 NOTE — ED Notes (Signed)
Family to bedside.

## 2015-03-20 NOTE — Progress Notes (Signed)
Pt is actively dying. Daughter, sister and grandson in room with pt.  Family aware of impending death.  Provided emotional support.  [place], [place] ext: [phone]

## 2015-03-20 NOTE — Progress Notes (Signed)
Pt brought to ED by EMS with [facility] being given. Upon that being finished, pt placed on 6L [place]. SPO2 100%, but pt with air hunger due to end stage COPD. Will wean O2 as tolerated.

## 2015-03-20 NOTE — Progress Notes (Signed)
Responded to page to continue support to patient's family.  Patient has decline and is actively dying.  Family is coping and accepting that patient is nearing end of life. I sat with family and listened to there reflections. Chaplain provided empathetic listening, ministry of presence, emotional and spiritual support.  Nurse will notify spiritual Care if further serves are needed.

## 2015-03-20 NOTE — Consult Note (Signed)
WOC consulted for LE wounds. Reviewed chart.  Patient admitted for comfort care, end stage COPD.  Followed by hospice.  To transfer to inpatient hospice house as soon as bed available.  On a MSO4 gtt currently.  Discussed with bedside nurse, she reports some mild weeping.  Some bulla.  Will add non adherent dressings for the bulla and kerlix is weeping is problematic for the patient.  Will not add dressings unless the nursing staff feels that the patient needs or wishes to have them due to the current situation.  Discussed POC with bedside nurse.  Re consult if needed, will not follow at this time. Thanks  Kron Everton Kellogg, [place] [phone])

## 2015-03-20 NOTE — Progress Notes (Signed)
Nutrition Brief Note  Chart reviewed. Pt now transitioning to comfort care.  No further nutrition interventions warranted at this time.  Please re-consult as needed.   Everrett Lacasse A. Byrdie Miyazaki, RD, LDN, CDE Pager: [phone] After hours Pager: [phone]  

## 2015-03-20 NOTE — Consult Note (Addendum)
Consultation Note Date: 03/22/2015   Patient Name: [name]  DOB: 26-Jun-1950  MRN: [id number]  Age / Sex: 64 y.o., female   PCP: Prince Solian, MD Referring Physician: Norval Morton, MD  Reason for Consultation: Non pain symptom management, Pain control and Terminal care  Palliative Care Assessment and Plan Summary of Established Goals of Care and Medical Treatment Preferences   Clinical Assessment/Narrative: Patient is a 64 year old female with PMH of stage III NSCLC, end-stage COPD, oxygen dependence, chronic pain, followed by hospice of [facility]; who presented to the emergency department with shortness of breath from home.  She is unresponsive and history is obtained from family and chart review.    I met with her daughter, sister, niece, and grandchildren.  They report that she been doing pretty well at home up until this morning. She then had an acute change in her condition. Her goal has been to remain at home as long as possible but eventually transition to another place at the time of her death.  When she read the ED, she was having significant distress with uncontrolled pain and dyspnea.  Her family reports that she is resting much more comfortably now.  I spent a lot of time discussing with family her clinical course to this point in time and educating on what they are seeing now that she is in the process of actively dying.  They are in agreement that currently her symptoms appear to be well managed.  Contacts/Participants in Discussion: Primary Decision Maker: Patient's daughter, Kathyrn Sheriff  HCPOA: None on chart   Code Status/Advance Care Planning:  DO NOT RESUSCITATE  Symptom Management:   Dyspnea: Continue aggressive management through continuous infusion of morphine. She appears to be resting comfortably at this time. She was in significant distress based upon reported earlier in the ED. Will plan to continue with the same and add on as needed rescue  dosing. If she is requiring frequent when necessary doses, we'll plan to continue titration of morphine infusion.  Anxiety: Ativan as needed  Excess secretions: Robinul as needed  Agitation: Haldol as needed  Additional Recommendations (Limitations, Scope, Preferences):  Comfort care only.  Is enrolled in hospice with [facility].  Her desire has been to remain at home as long as possible, but to transition to hospital or residential hospice at the time of her death  Psycho-social/Spiritual:   Support System: Strong through family  Desire for further Chaplaincy support:yes  Prognosis: Hours - Days  Discharge Planning:  To be determined. This will likely be a terminal admission. If her condition were to stabilize, could consider transition to [facility]. We will need to reassess this in the morning.       Chief Complaint/History of Present Illness:  64 year old female with squamous cell lung cancer and end-stage COPD currently on hospice care at home Primary Diagnoses  Present on Admission:  . 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 Protein-calorie malnutrition, severe ([place]) . Atrial fibrillation with rapid ventricular response ([place]) . End stage COPD ([place]) . COPD with acute exacerbation ([place]) . Chronic pain . Respiratory failure ([place])  Palliative Review of Systems: Unable to obtain. Patient does not arouse I have reviewed the medical record, interviewed the patient and family, and examined the patient. The following aspects are pertinent.  Past Medical History  Diagnosis Date  . Acute myocardial infarction, unspecified site, episode of care unspecified 2011    VF arrest. Ruled in, cath with no clear culprit lesion, inferior WMA  on LV gram, medical therapy  . Esophageal dysmotility   . Hemorrhoids   . Esophageal stricture   . Diaphragmatic hernia without mention of obstruction or gangrene   . 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 CAD (coronary  artery disease)     CABG'96., Xience DES -VG-RCA (01/12/13)  . Peripheral neuropathy ([place])   . Hyperlipidemia   . Peptic ulcer, unspecified site, unspecified as acute or chronic, without mention of hemorrhage, perforation, or obstruction   . Depressive disorder, not elsewhere classified   . Anxiety state, unspecified   . COPD (chronic obstructive pulmonary disease) ([place])   . GERD (gastroesophageal reflux disease)   . Candida esophagitis ([place])     on multiple occasions  . C. difficile diarrhea   . PVD (peripheral vascular disease) ([place])   . Headache(784.0)   . Weight loss 06/13/2011    scheduled here by family doctor  . CHF (congestive heart failure) ([place]) 2011  . Asthma   . Hypertension   . History of nuclear stress test 07/14/2010    dipyriadmole; normal pattern of perfusion, low risk   . History of tobacco use     quit 05/2009  . Non-small cell carcinoma of lung, stage 3 (HCC)     Dr. Roxan Hockey  . History of radiation therapy 02/28/2012-04/14/2012    right lung  . Bilateral carotid artery disease ([place])   . TIA (transient ischemic attack)     amaurosis fugax  . Tubular adenoma of colon   . On home oxygen therapy     "4L; 24/7" (01/08/2015)  . Atrial fibrillation [facility])    Social History   Social History  . Marital Status: Widowed    Spouse Name: N/A  . Number of Children: 2  . Years of Education: N/A   Occupational History  . disabled     back problems   Social History Main Topics  . Smoking status: Current Every Day Smoker -- 1.50 packs/day for 30 years    Types: Cigarettes  . Smokeless tobacco: Never Used  . Alcohol Use: No  . Drug Use: Yes    Special: Flunitrazepam  . Sexual Activity: Not Asked   Other Topics Concern  . None   Social History Narrative   Married with 2 children, husband has Alzheimer's dementia, retired now, previously Corporate treasurer, Quit smoking   Family History  Problem Relation Age of Onset  . Lung cancer Father     also  CVA  . Heart disease Father   . Asthma Father   . Allergies Father   . Cancer Father   . Heart disease Mother     also CVA, MI  . Brain cancer Mother   . Heart disease Sister   . Colon cancer Neg Hx   . Lung cancer Brother 75    also ENT cancer  . Hypertension Brother 38    also aneurysm, early CAD  . Breast cancer Other     family hx   Scheduled Meds:  Continuous Infusions: . morphine 10 mg/hr (03/11/2015 1149)   PRN Meds:.bisacodyl, glycopyrrolate **OR** glycopyrrolate **OR** glycopyrrolate, haloperidol **OR** haloperidol **OR** haloperidol lactate, ipratropium-albuterol, LORazepam, morphine CONCENTRATE, morphine injection, ondansetron **OR** ondansetron (ZOFRAN) IV Medications Prior to Admission:  Prior to Admission medications   Medication Sig Start Date End Date Taking? Authorizing Provider  [facility] BISACODYL 5 MG EC tablet Take 10 mg by mouth daily as needed for mild constipation.  02/12/15  Yes Historical Provider, MD  famotidine (PEPCID) 20 MG tablet Take 1 tablet (20 mg total) by mouth at bedtime. 12/11/14  Yes Lafayette Dragon, MD  feeding supplement, ENSURE ENLIVE, (ENSURE ENLIVE) LIQD Take 237 mLs by mouth 3 (three) times daily between meals. 01/28/15  Yes Geradine Girt, DO  fluconazole (DIFLUCAN) 100 MG tablet Take 1 tablet (100 mg total) by mouth daily. 01/10/15  Yes Kinnie Feil, MD  furosemide (LASIX) 20 MG tablet Take 20 mg by mouth daily.   Yes Historical Provider, MD  MUCINEX 600 MG 12 hr tablet Take 1,200 mg by mouth every 12 (twelve) hours as needed for cough.  01/15/15  Yes Historical Provider, MD  omeprazole (PRILOSEC) 40 MG capsule Take 40 mg by mouth daily. 02/19/15  Yes Historical Provider, MD  ondansetron (ZOFRAN) 4 MG tablet Take 4 mg by mouth every 8 (eight) hours as needed for nausea or vomiting.  02/14/15  Yes Historical Provider, MD  polyethylene glycol powder (GLYCOLAX/MIRALAX) powder Take 17 g by mouth daily.  02/12/15  Yes Historical Provider, MD  SYMBICORT  160-4.5 MCG/ACT inhaler Inhale 2 puffs into the lungs every 12 (twelve) hours as needed (shortness of breath).  02/19/15  Yes Historical Provider, MD  albuterol (PROVENTIL) (2.5 MG/3ML) 0.083% nebulizer solution Take 3 mLs (2.5 mg total) by nebulization every 6 (six) hours as needed for wheezing or shortness of breath. Patient not taking: Reported on 02/25/2015 01/10/15   Kinnie Feil, MD  benzonatate (TESSALON) 100 MG capsule Take 100 mg by mouth 3 (three) times daily as needed for cough.  01/15/15   Historical Provider, MD  [employer]S ARTHRITIS PAIN RELIEF 650 MG CR tablet Take 650 mg by mouth every 8 (eight) hours as needed for pain.  01/15/15   Historical Provider, MD  diltiazem (CARDIZEM) 10 mg/ml oral suspension Take 6 mLs (60 mg total) by mouth every 6 (six) hours. Patient not taking: Reported on 02/25/2015 01/28/15   Geradine Girt, DO  LORazepam (ATIVAN) 2 MG/ML injection Inject 0.25 mLs (0.5 mg total) into the vein every 2 (two) hours as needed (anxiety). 02/27/15   Bonnielee Haff, MD  morphine 250 mg in dextrose 5 % 250 mL Inject 2 mg/hr into the vein continuous. 02/27/15   Bonnielee Haff, MD  morphine 5 mg/mL SOLN Inject 2 mg into the vein every hour as needed (or dyspnea). 02/27/15   Bonnielee Haff, MD  nicotine (NICODERM CQ - DOSED IN MG/24 HOURS) 21 mg/24hr patch Place 1 patch (21 mg total) onto the skin daily. 02/27/15   Bonnielee Haff, MD  nitroGLYCERIN (NITROGLYN) 2 % ointment Apply 0.5 inches topically every 6 (six) hours. 02/27/15   Bonnielee Haff, MD  OXYGEN-HELIUM IN Inhale 4 L into the lungs continuous.    Historical Provider, MD  predniSONE (DELTASONE) 20 MG tablet Take 3 tablets (60 mg total) by mouth 2 (two) times daily with a meal. 02/27/15   Bonnielee Haff, MD  SPIRIVA HANDIHALER 18 MCG inhalation capsule PLACE 1 CAPSULE (18 MCG TOTAL) INTO INHALER AND INHALE DAILY. Patient not taking: Reported on 02/25/2015 10/28/14   Noralee Space, MD   Allergies  Allergen Reactions  . Amiodarone  Shortness Of Breath and Other (See Comments)    Drops HR too low, fatigue  . Clopidogrel Bisulfate Hives  . Dilaudid [Hydromorphone Hcl] Other (See Comments)    "I couldn't function for 2 days." lethargy  . Multaq [Dronedarone] Other (See Comments)    Heart races   . Tape Itching and Rash  Tears skin.  Please use "paper" tape   CBC:    Component Value Date/Time   WBC 8.8 02/25/2015 1254   WBC 7.7 11/14/2014 0953   HGB 12.1 02/25/2015 1254   HGB 12.9 11/14/2014 0953   HCT 35.5* 02/25/2015 1254   HCT 38.2 11/14/2014 0953   PLT 177 02/25/2015 1254   PLT 192 11/14/2014 0953   MCV 90.8 02/25/2015 1254   MCV 95.7 11/14/2014 0953   NEUTROABS 5.8 01/25/2015 1005   NEUTROABS 6.0 11/14/2014 0953   LYMPHSABS 0.6* 01/25/2015 1005   LYMPHSABS 1.0 11/14/2014 0953   MONOABS 0.6 01/25/2015 1005   MONOABS 0.5 11/14/2014 0953   EOSABS 0.1 01/25/2015 1005   EOSABS 0.1 11/14/2014 0953   BASOSABS 0.0 01/25/2015 1005   BASOSABS 0.0 11/14/2014 0953   Comprehensive Metabolic Panel:    Component Value Date/Time   NA 129* 02/26/2015 0312   NA 139 11/14/2014 0953   K 4.9 02/26/2015 0312   K 4.3 11/14/2014 0953   CL 88* 02/26/2015 0312   CL 104 11/07/2012 0839   CO2 31 02/26/2015 0312   CO2 32* 11/14/2014 0953   BUN 13 02/26/2015 0312   BUN 11.5 11/14/2014 0953   CREATININE 0.52 02/26/2015 0312   CREATININE 0.6 11/14/2014 0953   GLUCOSE 106* 02/26/2015 0312   GLUCOSE 88 11/14/2014 0953   GLUCOSE 92 11/07/2012 0839   CALCIUM 8.8* 02/26/2015 0312   CALCIUM 9.2 11/14/2014 0953   AST 21 11/14/2014 0953   AST 28 06/26/2014 0535   ALT 17 11/14/2014 0953   ALT 38* 06/26/2014 0535   ALKPHOS 98 11/14/2014 0953   ALKPHOS 79 06/26/2014 0535   BILITOT 0.29 11/14/2014 0953   BILITOT 0.3 06/26/2014 0535   PROT 6.7 11/14/2014 0953   PROT 5.5* 06/26/2014 0535   ALBUMIN 3.9 11/14/2014 0953   ALBUMIN 3.1* 06/26/2014 0535    Physical Exam: Vital Signs: BP 122/88 mmHg  Pulse 70  Temp(Src)  97.3 F (36.3 C) (Oral)  Resp 30  Wt 43.681 kg (96 lb 4.8 oz)  SpO2 97% SpO2: SpO2: 97 % O2 Device: O2 Device: Nasal Cannula O2 Flow Rate: O2 Flow Rate (L/min): 5 L/min Intake/output summary:  Intake/Output Summary (Last 24 hours) at 03/08/2015 1849 Last data filed at 03/19/2015 1322  Gross per 24 hour  Intake      0 ml  Output    375 ml  Net   -375 ml   LBM:   Baseline Weight: Weight: 43.681 kg (96 lb 4.8 oz) Most recent weight: Weight: 43.681 kg (96 lb 4.8 oz)  Exam Findings:  General: Frail, elderly-appearing female lying in bed in no acute distress. Periods of apnea noted. She does not arouse during encounter HEENT: No bruits, no goiter, no JVD Heart: Tachycardic. Irregular. No murmur appreciated. Lungs: Wheezing, rhonchi, and rales throughout. No acute distress diminished air movement Abdomen: Soft, nontender, nondistended, positive bowel sounds.  Ext: No significant edema Skin: Lesions of lower extremity noted. Currently wrapped. Neuro: Unable to assess. She remains nonresponsive to verbal and tactile stimulation.         Palliative Performance Scale:10               Additional Data Reviewed: No results for input(s): WBC, HGB, PLT, NA, BUN, CREATININE in the last 72 hours.  Invalid input(s): ALB   Time In: 1400 Time Out: 1500 Time Total: 60 Greater than 50%  of this time was spent counseling and coordinating care related to the above  assessment and plan.  Signed by: Micheline Rough, MD  Micheline Rough, MD  03/22/2015, 6:49 PM  Please contact Palliative Medicine Team phone at [phone] for questions and concerns.

## 2015-03-21 DIAGNOSIS — G8929 Other chronic pain: Secondary | ICD-10-CM | POA: Diagnosis present

## 2015-03-21 DIAGNOSIS — Z7951 Long term (current) use of inhaled steroids: Secondary | ICD-10-CM | POA: Diagnosis not present

## 2015-03-21 DIAGNOSIS — Z79899 Other long term (current) drug therapy: Secondary | ICD-10-CM | POA: Diagnosis not present

## 2015-03-21 DIAGNOSIS — Z951 Presence of aortocoronary bypass graft: Secondary | ICD-10-CM | POA: Diagnosis not present

## 2015-03-21 DIAGNOSIS — F17210 Nicotine dependence, cigarettes, uncomplicated: Secondary | ICD-10-CM | POA: Diagnosis present

## 2015-03-21 DIAGNOSIS — Z955 Presence of coronary angioplasty implant and graft: Secondary | ICD-10-CM | POA: Diagnosis not present

## 2015-03-21 DIAGNOSIS — Z888 Allergy status to other drugs, medicaments and biological substances status: Secondary | ICD-10-CM | POA: Diagnosis not present

## 2015-03-21 DIAGNOSIS — J441 Chronic obstructive pulmonary disease with (acute) exacerbation: Secondary | ICD-10-CM | POA: Diagnosis not present

## 2015-03-21 DIAGNOSIS — R451 Restlessness and agitation: Secondary | ICD-10-CM | POA: Diagnosis not present

## 2015-03-21 DIAGNOSIS — Z825 Family history of asthma and other chronic lower respiratory diseases: Secondary | ICD-10-CM | POA: Diagnosis not present

## 2015-03-21 DIAGNOSIS — E43 Unspecified severe protein-calorie malnutrition: Secondary | ICD-10-CM | POA: Diagnosis not present

## 2015-03-21 DIAGNOSIS — Z66 Do not resuscitate: Secondary | ICD-10-CM

## 2015-03-21 DIAGNOSIS — Z885 Allergy status to narcotic agent status: Secondary | ICD-10-CM | POA: Diagnosis not present

## 2015-03-21 DIAGNOSIS — K224 Dyskinesia of esophagus: Secondary | ICD-10-CM | POA: Diagnosis present

## 2015-03-21 DIAGNOSIS — L8990 Pressure ulcer of unspecified site, unspecified stage: Secondary | ICD-10-CM | POA: Insufficient documentation

## 2015-03-21 DIAGNOSIS — K219 Gastro-esophageal reflux disease without esophagitis: Secondary | ICD-10-CM | POA: Diagnosis present

## 2015-03-21 DIAGNOSIS — Z923 Personal history of irradiation: Secondary | ICD-10-CM | POA: Diagnosis not present

## 2015-03-21 DIAGNOSIS — I509 Heart failure, unspecified: Secondary | ICD-10-CM | POA: Diagnosis present

## 2015-03-21 DIAGNOSIS — I4891 Unspecified atrial fibrillation: Secondary | ICD-10-CM | POA: Diagnosis present

## 2015-03-21 DIAGNOSIS — Z9221 Personal history of antineoplastic chemotherapy: Secondary | ICD-10-CM | POA: Diagnosis not present

## 2015-03-21 DIAGNOSIS — J45909 Unspecified asthma, uncomplicated: Secondary | ICD-10-CM | POA: Diagnosis present

## 2015-03-21 DIAGNOSIS — C3491 Malignant neoplasm of unspecified part of right bronchus or lung: Secondary | ICD-10-CM | POA: Diagnosis present

## 2015-03-21 DIAGNOSIS — R64 Cachexia: Secondary | ICD-10-CM | POA: Diagnosis present

## 2015-03-21 DIAGNOSIS — Z8673 Personal history of transient ischemic attack (TIA), and cerebral infarction without residual deficits: Secondary | ICD-10-CM | POA: Diagnosis not present

## 2015-03-21 DIAGNOSIS — R238 Other skin changes: Secondary | ICD-10-CM | POA: Diagnosis present

## 2015-03-21 DIAGNOSIS — I2510 Atherosclerotic heart disease of native coronary artery without angina pectoris: Secondary | ICD-10-CM | POA: Diagnosis present

## 2015-03-21 DIAGNOSIS — F329 Major depressive disorder, single episode, unspecified: Secondary | ICD-10-CM | POA: Diagnosis present

## 2015-03-21 DIAGNOSIS — J9620 Acute and chronic respiratory failure, unspecified whether with hypoxia or hypercapnia: Secondary | ICD-10-CM | POA: Diagnosis not present

## 2015-03-21 DIAGNOSIS — I739 Peripheral vascular disease, unspecified: Secondary | ICD-10-CM | POA: Diagnosis present

## 2015-03-21 DIAGNOSIS — Z9981 Dependence on supplemental oxygen: Secondary | ICD-10-CM | POA: Diagnosis not present

## 2015-03-21 DIAGNOSIS — F411 Generalized anxiety disorder: Secondary | ICD-10-CM | POA: Diagnosis present

## 2015-03-21 DIAGNOSIS — Z808 Family history of malignant neoplasm of other organs or systems: Secondary | ICD-10-CM | POA: Diagnosis not present

## 2015-03-21 DIAGNOSIS — Z515 Encounter for palliative care: Secondary | ICD-10-CM | POA: Diagnosis not present

## 2015-03-21 DIAGNOSIS — E785 Hyperlipidemia, unspecified: Secondary | ICD-10-CM | POA: Diagnosis present

## 2015-03-21 DIAGNOSIS — I110 Hypertensive heart disease with heart failure: Secondary | ICD-10-CM | POA: Diagnosis present

## 2015-03-21 DIAGNOSIS — J9621 Acute and chronic respiratory failure with hypoxia: Secondary | ICD-10-CM | POA: Diagnosis present

## 2015-03-21 DIAGNOSIS — R0600 Dyspnea, unspecified: Secondary | ICD-10-CM | POA: Diagnosis not present

## 2015-03-21 DIAGNOSIS — Z8711 Personal history of peptic ulcer disease: Secondary | ICD-10-CM | POA: Diagnosis not present

## 2015-03-21 DIAGNOSIS — J449 Chronic obstructive pulmonary disease, unspecified: Secondary | ICD-10-CM | POA: Diagnosis not present

## 2015-03-21 DIAGNOSIS — Z85118 Personal history of other malignant neoplasm of bronchus and lung: Secondary | ICD-10-CM | POA: Diagnosis not present

## 2015-03-21 DIAGNOSIS — G629 Polyneuropathy, unspecified: Secondary | ICD-10-CM | POA: Diagnosis present

## 2015-03-21 DIAGNOSIS — I252 Old myocardial infarction: Secondary | ICD-10-CM | POA: Diagnosis not present

## 2015-03-21 DIAGNOSIS — Z8249 Family history of ischemic heart disease and other diseases of the circulatory system: Secondary | ICD-10-CM | POA: Diagnosis not present

## 2015-03-21 DIAGNOSIS — Z91048 Other nonmedicinal substance allergy status: Secondary | ICD-10-CM | POA: Diagnosis not present

## 2015-03-21 MED ORDER — HALOPERIDOL LACTATE 2 MG/ML PO CONC
1.0000 mg | ORAL | Status: DC | PRN
  Filled 2015-03-21: qty 0.5

## 2015-03-21 MED ORDER — HALOPERIDOL LACTATE 5 MG/ML IJ SOLN: 1.0000 mg | INTRAMUSCULAR | Status: DC | PRN

## 2015-03-21 MED ORDER — HALOPERIDOL 1 MG PO TABS: 1.0000 mg | ORAL_TABLET | ORAL | Status: DC | PRN

## 2015-03-21 MED ORDER — NICOTINE 21 MG/24HR TD PT24
21.0000 mg | MEDICATED_PATCH | Freq: Every day | TRANSDERMAL | Status: DC
  Administered 2015-03-21: 21 mg via TRANSDERMAL
  Filled 2015-03-21: qty 1

## 2015-03-21 NOTE — Progress Notes (Signed)
  PROGRESS NOTE  [name] NIO:[id number] DOB: 02-Feb-1951 DOA: 03/09/2015 PCP: Tivis Ringer, MD  Assessment/Plan: shortness of breath - from home where she was receiving hospice care.  -Family unable to manage patient at home  - comfort care measures. -morphine gtt at 10/hr   Code Status: DNR Family Communication: no family at bedside Disposition Plan:    Consultants:  Palliative care  Procedures:      HPI/Subjective: Raspy breathing  Objective: Filed Vitals:   03/02/2015 2200  BP: 95/55  Pulse:   Temp:   Resp:     Intake/Output Summary (Last 24 hours) at 03/21/15 1236 Last data filed at 03/21/15 0851  Gross per 24 hour  Intake      0 ml  Output    925 ml  Net   -925 ml   Filed Weights   03/24/2015 1231  Weight: 43.681 kg (96 lb 4.8 oz)    Exam:   General:  Periods of apnea   Data Reviewed: Basic Metabolic Panel: No results for input(s): NA, K, CL, CO2, GLUCOSE, BUN, CREATININE, CALCIUM, MG, PHOS in the last 168 hours. Liver Function Tests: No results for input(s): AST, ALT, ALKPHOS, BILITOT, PROT, ALBUMIN in the last 168 hours. No results for input(s): LIPASE, AMYLASE in the last 168 hours. No results for input(s): AMMONIA in the last 168 hours. CBC: No results for input(s): WBC, NEUTROABS, HGB, HCT, MCV, PLT in the last 168 hours. Cardiac Enzymes: No results for input(s): CKTOTAL, CKMB, CKMBINDEX, TROPONINI in the last 168 hours. BNP (last 3 results)  Recent Labs  01/08/15 0918 01/25/15 1006 02/25/15 1254  BNP 261.2* 634.8* 1406.6*    ProBNP (last 3 results) No results for input(s): PROBNP in the last 8760 hours.  CBG: No results for input(s): GLUCAP in the last 168 hours.  No results found for this or any previous visit (from the past 240 hour(s)).   Studies: No results found.  Scheduled Meds:  Continuous Infusions: . 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

## 2015-03-21 NOTE — Progress Notes (Signed)
   03/21/15 0900  Clinical Encounter Type  Visited With Patient not available;Health care provider  Visit Type Follow-up  Referral From Nurse  Consult/Referral To Chaplain  Spiritual Encounters  Spiritual Needs (end of life)  Stress Factors  Family Stress Factors Loss  [facility] responded to consult for end of life; family not present; RN aware that Chaplain available when family arrives and to page if needed.  9:35 AM Gwynn Burly

## 2015-03-21 NOTE — Progress Notes (Signed)
[facility]) Chaplain Visit: Met with pt's daughter Terrence Dupont, adult granddaughter [place] and teen grandson Doren Custard in pt's room. Family familiar with chaplain from home hospice visits. Pt resting comfortably in bed, with occasional congested coughing (which caused anxiety in family), and pt able to rouse for brief moments to respond to family asking pt about comfort, including "are you hungry?"  As pt was roused at one point, pt able to recognize chaplain and smile weakly, nodding yes to affirmation of faith and prayer. Family tearful as pt said "I love you." Family open with grief and anxiety, referring to pt's restless and aggressive episode last night.  Family aware [facility] transfer might be possible, but at this point wanting pt to be comfortable in hospital and not moved.  Family thankful for spiritual counsel and support, and that their pastor has been updated. Carlus Pavlov, [facility] Chaplain

## 2015-03-21 NOTE — Progress Notes (Signed)
Daily Progress Note   Patient Name: [name]       Date: 03/21/2015 DOB: 01/15/1951  Age: 64 y.o. MRN#: [id number] Attending Physician: Geradine Girt, DO Primary Care Physician: Tivis Ringer, MD Admit Date: 02/22/2015  Reason for Consultation/Follow-up: Non pain symptom management  Subjective: Patient is a 64 year old female with PMH of stage III NSCLC, end-stage COPD, oxygen dependence, chronic pain, followed by hospice of [facility]; who presented to the emergency department with shortness of breath from home.  Her goal is comfort care and she was started on morphine infusion.     Interval Events: Patient seen and examined this morning.  No family present at time of my exam. She is lying in bed in no acute distress. She does not arouse to verbal or tactile stimulation.  I spoke with bedside nurse and she received signout the patient received a couple doses of Ativan overnight due to agitation and trying to get out of bed.  Length of Stay: none  Current Medications: Scheduled Meds:     Continuous Infusions: . morphine 10 mg/hr (03/16/2015 1149)    PRN Meds: bisacodyl, glycopyrrolate **OR** glycopyrrolate **OR** glycopyrrolate, haloperidol **OR** haloperidol **OR** haloperidol lactate, ipratropium-albuterol, LORazepam, morphine CONCENTRATE, morphine injection, ondansetron **OR** ondansetron (ZOFRAN) IV  Palliative Performance Scale: 10%     Vital Signs: BP 95/55 mmHg  Pulse 70  Temp(Src) 97.3 F (36.3 C) (Oral)  Resp 30  Wt 43.681 kg (96 lb 4.8 oz)  SpO2 97% SpO2: SpO2: 97 % O2 Device: O2 Device: Nasal Cannula O2 Flow Rate: O2 Flow Rate (L/min): 5 L/min  Intake/output summary:  Intake/Output Summary (Last 24 hours) at 03/21/15 0903 Last data filed at 03/21/15 0851  Gross per 24 hour  Intake      0 ml  Output    925 ml  Net   -925 ml   LBM:   Baseline Weight: Weight: 43.681 kg (96 lb 4.8 oz) Most recent weight: Weight: 43.681 kg (96 lb 4.8  oz)  Physical Exam: General: Frail, elderly-appearing female lying in bed in no acute distress. Continued apnea noted. She does not arouse during encounter Heart: Tachycardic. Irregular. No murmur appreciated. Diminished radial pulse. Lungs: Diminished throughout. Clear but poor air movement. Abdomen: Soft, nontender, nondistended, positive bowel sounds.  Ext: No significant edema Skin: Lesions of lower extremity noted. Currently wrapped. Neuro: Unable to assess. She remains nonresponsive to verbal and tactile stimulation              Additional Data Reviewed: No results for input(s): WBC, HGB, PLT, NA, BUN, CREATININE in the last 72 hours.  Invalid input(s): ALB   Problem List:  Patient Active Problem List   Diagnosis Date Noted  . Atrial fibrillation with rapid ventricular response ([place]) 03/17/2015  . Chronic pain 03/03/2015  . Respiratory failure ([place]) 03/10/2015  . Dyspnea   . Encounter for palliative care   . Hyponatremia 02/25/2015  . Atrial flutter ([place]) 02/25/2015  . Atrial tachycardia ([place]) 01/25/2015  . COPD with acute exacerbation ([place]) 01/25/2015  . Acute on chronic respiratory failure with hypoxia ([place]) 01/25/2015  . Palliative care encounter   . Oropharyngeal candidiasis 01/08/2015  . Headache disorder 09/24/2014  . Carotid stenosis 09/24/2014  . Acute and chronic respiratory failure with hypoxia ([place]) 06/23/2014  . Transient blindness of right eye 03/03/2014  . Chronic respiratory failure with hypoxia ([place]) 07/20/2013  . Cardiomyopathy, ischemic - mild to moderate. EF ~45% 01/13/2013  . CAD (coronary artery disease), autologous vein  bypass graft, cath 01/12/13 with Xience DES to VG-RCA 01/13/2013    Class: Acute  . Protein-calorie malnutrition, severe ([place]) 01/11/2013  . Acute myocardial infarction, unspecified site, episode of care unspecified   . Esophageal dysmotility   . Esophageal stricture   . Peripheral neuropathy ([place])   . C. difficile diarrhea    . PVD (peripheral vascular disease)- Rt SCA PTA   . Hypertension   . Hx of radiation therapy and chemo therapy   . 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 End stage COPD ([place]) 10/27/2011  . Hyperlipidemia 07/31/2007  . Anxiety state 07/31/2007  . Depression 07/31/2007  . GERD 07/31/2007  . PEPTIC ULCER DISEASE 07/31/2007  . GASTRITIS, CHRONIC 03/17/2006  . HIATAL HERNIA 03/17/2006  . POLYP, COLON 01/21/2005  . HEMORRHOIDS 01/21/2005     Palliative Care Assessment & Plan    Code Status:  DNR  Goals of Care:  Is enrolled in hospice with [facility]. Her desire has been to remain at home as long as possible, but to transition to hospital or residential hospice at the time of her death.  Patient is actively dying. Comfort care only.  Symptom Management:  Dyspnea: Continue aggressive management (currently through continuous infusion of morphine). She appears to be resting comfortably at this time. She was in significant distress on presentation to ED yesterday.   Anxiety: Ativan as needed  Excess secretions: Robinul as needed  Agitation: Haldol as needed  Psycho-social/Spiritual:  Desire for further Chaplaincy support: Family has been in contact with chaplain services.   Prognosis: Hours - Days Discharge Planning:  She is currently enrolled with hospice and palliative care of [facility] and is under their care this admission. This will likely be a terminal admission. If her condition were to stabilize, could consider transition to [facility].   Care plan was discussed with bedside nurse  Thank you for allowing the Palliative Medicine Team to assist in the care of this patient.   Time In: 0800 Time Out: 0820 Total Time 20 Prolonged Time Billed no    Greater than 50%  of this time was spent counseling and coordinating care related to the above assessment and plan.   Micheline Rough, MD  03/21/2015, 9:03 AM  Please contact Palliative Medicine Team  phone at [phone] for questions and concerns.

## 2015-03-21 NOTE — Progress Notes (Signed)
   03/21/15 1330  Clinical Encounter Type  Visited With Health care provider;Patient and family together  Visit Type Follow-up;Patient actively dying  Referral From Family  Spiritual Encounters  Spiritual Needs Emotional  Stress Factors  Family Stress Factors Loss   Chaplain met with patient's daughter and offered our support. Chaplain available as needed.   Jeri Lager, Chaplain 03/21/2015 1:31 PM

## 2015-03-21 NOTE — Progress Notes (Signed)
Jefm Petty 6N Room 11-[facility] RN Visit-Stacie Carlye Grippe RN, BSN  This is a related admission to [facility] diagnosis of COPD.  Patient is a DNR.  Patient seen is room.  No family present at time of visit.  Patient is unresponsive to verbal stimuli.  Patient continues to have episodes of apnea. Patient currently receiving continuous Morphine via IV at the rate of 10 mg/hr.  She received one dose of 1 mg Ativan IV overnight.  [facility] social worker updated on patient status.  [facility] will continue to follow.  Please contact for any questions.  Thank you, Freddi Starr RN, [facility] Liaison [phone]

## 2015-03-21 NOTE — Progress Notes (Signed)
Pt resting comfortably.  Pt's daughter and grandson with pt.  Daughter questioning why pt is not getting IV fluids.  Educated that pt has chosen comfort care which would not include IV fluids.  Daughter says that she was not included in that decision.  Talked about pt's desire for comfort care only when she was in the emergency department.  Daughter questioning if pt was lucid enough to make that decision.  Spoke with Etter Sjogren, hospice hospital liaison regarding daughter's concerns.  She will follow up with palliative care physician about daughter's concerns. Alianza, [place] ext: [phone]

## 2015-03-22 DIAGNOSIS — R0600 Dyspnea, unspecified: Secondary | ICD-10-CM

## 2015-03-22 NOTE — Progress Notes (Signed)
DNR.  Patient admitted for end of life care.  She is unresponsive and very peaceful.   No indicators of pain noted.  Daughter arrived during this visit and is aware of patient's impending death and will call family in.  Advised to contact [facility] should she need additional support.  Katherina Right, SW for [facility] aware of patient's condition and possible need for extra support.  Please contact [facility] [phone] for additional needs or time of death.  Vance Gather, RN [facility]

## 2015-03-22 NOTE — Progress Notes (Signed)
Daily Progress Note   Patient Name: [name]       Date: 03/22/2015 DOB: April 29, 1951  Age: 64 y.o. MRN#: [id number] Attending Physician: Jonetta Osgood, MD Primary Care Physician: Tivis Ringer, MD Admit Date: 03/01/2015  Reason for Consultation/Follow-up: Non pain symptom management  Subjective: Patient is a 64 year old female with PMH of stage III NSCLC, end-stage COPD, oxygen dependence, chronic pain, followed by hospice of [facility]; who presented to the emergency department with shortness of breath from home.  Her goal is comfort care and she was started on morphine infusion.     Interval Events: Patient seen and examined this morning.  No family present at time of my exam. She is lying in bed in no acute distress. She does not arouse to verbal or tactile stimulation.  Length of Stay: 1 day  Current Medications: Scheduled Meds:  . nicotine  21 mg Transdermal Daily    Continuous Infusions: . morphine 10 mg/hr (03/21/15 1116)    PRN Meds: bisacodyl, glycopyrrolate **OR** glycopyrrolate **OR** glycopyrrolate, haloperidol **OR** haloperidol **OR** haloperidol lactate, ipratropium-albuterol, LORazepam, morphine CONCENTRATE, morphine injection, ondansetron **OR** ondansetron (ZOFRAN) IV  Palliative Performance Scale: 10%     Vital Signs: BP 91/70 mmHg  Pulse 144  Temp(Src) 98.2 F (36.8 C) (Oral)  Resp 19  Wt 42.33 kg (93 lb 5.1 oz)  SpO2 100% SpO2: SpO2: 100 % O2 Device: O2 Device: Nasal Cannula O2 Flow Rate: O2 Flow Rate (L/min): 4 L/min  Intake/output summary:   Intake/Output Summary (Last 24 hours) at 03/22/15 0933 Last data filed at 03/22/15 0700  Gross per 24 hour  Intake  263.5 ml  Output    450 ml  Net -186.5 ml   LBM:   Baseline Weight: Weight: 43.681 kg (96 lb 4.8 oz) Most recent weight: Weight: 42.33 kg (93 lb 5.1 oz)  Physical Exam: General: Frail, elderly-appearing female lying in bed in no acute distress. Continued apnea and  shallow respirations. She does not arouse during encounter Heart: Tachycardic. No murmur appreciated. No palpable radial pulse. Lungs: Diminished throughout. Minimal air movement. Abdomen: Soft, nontender, nondistended.  Ext: No significant edema Skin: Lesions of lower extremity noted. Currently wrapped. Neuro: She remains nonresponsive to verbal and tactile stimulation              Additional Data Reviewed: No results for input(s): WBC, HGB, PLT, NA, BUN, CREATININE in the last 72 hours.  Invalid input(s): ALB   Problem List:  Patient Active Problem List   Diagnosis Date Noted  . Pressure ulcer 03/21/2015  . DNR (do not resuscitate)   . Atrial fibrillation with rapid ventricular response ([place]) 03/08/2015  . Chronic pain 03/10/2015  . Respiratory failure ([place]) 03/08/2015  . Dyspnea   . Encounter for palliative care   . Hyponatremia 02/25/2015  . Atrial flutter ([place]) 02/25/2015  . Atrial tachycardia ([place]) 01/25/2015  . COPD with acute exacerbation ([place]) 01/25/2015  . Acute on chronic respiratory failure with hypoxia ([place]) 01/25/2015  . Palliative care encounter   . Oropharyngeal candidiasis 01/08/2015  . Headache disorder 09/24/2014  . Carotid stenosis 09/24/2014  . Acute and chronic respiratory failure with hypoxia ([place]) 06/23/2014  . Transient blindness of right eye 03/03/2014  . Chronic respiratory failure with hypoxia ([place]) 07/20/2013  . Cardiomyopathy, ischemic - mild to moderate. EF ~45% 01/13/2013  . CAD (coronary artery disease), autologous vein bypass graft, cath 01/12/13 with Xience DES to VG-RCA 01/13/2013    Class: Acute  . Protein-calorie malnutrition, severe (  [place]) 01/11/2013  . Acute myocardial infarction, unspecified site, episode of care unspecified   . Esophageal dysmotility   . Esophageal stricture   . Peripheral neuropathy ([place])   . C. difficile diarrhea   . PVD (peripheral vascular disease)- Rt SCA PTA   . Hypertension   . Hx of radiation therapy and  chemo therapy   . 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 End stage COPD ([place]) 10/27/2011  . Hyperlipidemia 07/31/2007  . Anxiety state 07/31/2007  . Depression 07/31/2007  . GERD 07/31/2007  . PEPTIC ULCER DISEASE 07/31/2007  . GASTRITIS, CHRONIC 03/17/2006  . HIATAL HERNIA 03/17/2006  . POLYP, COLON 01/21/2005  . HEMORRHOIDS 01/21/2005     Palliative Care Assessment & Plan    Code Status:  DNR  Goals of Care:  Is enrolled in hospice with [facility]. Her desire has been to remain at home as long as possible, but to transition to hospital or residential hospice at the time of her death.  Patient is actively dying. Comfort care only.  Symptom Management:  Dyspnea: Continue aggressive management (currently through continuous infusion of morphine). She was in significant distress on presentation to ED.  She is currently unresponsive and appears to be resting comfortably.   Anxiety: Ativan as needed  Excess secretions: Robinul as needed  Agitation: Haldol as needed  Psycho-social/Spiritual:  Desire for further Chaplaincy support: Family has been in contact with chaplain services.   Prognosis: Hours - Days Discharge Planning:  She is currently enrolled with hospice and palliative care of [facility] and is under their care this admission.  She is actively dying and this will be a terminal admission.   Care plan was discussed with bedside nurse  Thank you for allowing the Palliative Medicine Team to assist in the care of this patient.   Time In: 0900 Time Out: 0920 Total Time 20 Prolonged Time Billed no    Greater than 50%  of this time was spent counseling and coordinating care related to the above assessment and plan.   Micheline Rough, MD  03/22/2015, 9:33 AM  Please contact Palliative Medicine Team phone at [phone] for questions and concerns.

## 2015-03-22 NOTE — Progress Notes (Addendum)
Unresponsive-on morphine infusion. Appears tachypneic Vital signs-reviewed-pulse 144, blood pressure 91/70  Chest: Moving air-some rhonchi Abdomen: Soft nontender Extremities: No edema  Impression: End-stage COPD with chronic respiratory failure-now on full comfort measures.  Plan: Continue full comfort measures, expect inpatient death-suspect will not tolerate transfer to residential hospice urine  Discussed with daughter at bedside

## 2015-03-25 NOTE — Progress Notes (Signed)
Pt expired at 0935 and was unattended. Daughter notified. MD notified.  Daughter and family later at the bedside. PT cleaned up.  IV and foley removed.  Post-mortem checklist completed.

## 2015-03-25 NOTE — Progress Notes (Signed)
Chaplain responded to the notification of death of a patient by the Nursing Staff, chaplain presented to the room where family was gathered around the bedside of the patient. A prayer of comfort and peace for offered for the family.  They shared life stories of their loved one and how much she was loved by all she met.  Chaplain informed the family of all necessary information needed by the staff regarding final arrangement for the patient. They were appreciative of all of the support by the hospital Chaplain Yaakov Guthrie [phone]

## 2015-03-25 NOTE — Progress Notes (Signed)
70 ml of morphine drip wasted.

## 2015-03-25 NOTE — Progress Notes (Signed)
Daily Progress Note   Patient Name: [name]       Date: 04/22/15 DOB: 07-17-1950  Age: 64 y.o. MRN#: [id number] Attending Physician: Florencia Reasons, MD Primary Care Physician: Tivis Ringer, MD Admit Date: 03/10/2015  Reason for Consultation/Follow-up: Non pain symptom management  Subjective: Patient is a 64 year old female with PMH of stage III NSCLC, end-stage COPD, oxygen dependence, chronic pain, followed by hospice of [facility]; who presented to the emergency department with shortness of breath from home.  Her goal is comfort care and she was started on morphine infusion.     Interval Events: Patient seen and examined this morning.  No family present at time of my exam.   She is lying in bed in no acute distress. She does not arouse to verbal or tactile stimulation.  Length of Stay: 2 days  Current Medications: Scheduled Meds:  . nicotine  21 mg Transdermal Daily    Continuous Infusions: . morphine 10 mg/hr (03/22/15 1407)    PRN Meds: bisacodyl, glycopyrrolate **OR** glycopyrrolate **OR** glycopyrrolate, haloperidol **OR** haloperidol **OR** haloperidol lactate, ipratropium-albuterol, LORazepam, morphine CONCENTRATE, morphine injection, ondansetron **OR** ondansetron (ZOFRAN) IV  Palliative Performance Scale: 10%     Vital Signs: BP 102/56 mmHg  Pulse 92  Temp(Src) 98.2 F (36.8 C) (Oral)  Resp 20  Wt 42.33 kg (93 lb 5.1 oz)  SpO2 92% SpO2: SpO2: 92 % O2 Device: O2 Device: Nasal Cannula O2 Flow Rate: O2 Flow Rate (L/min): 4 L/min  Intake/output summary:   Intake/Output Summary (Last 24 hours) at Apr 22, 2015 0901 Last data filed at 04-22-2015 0500  Gross per 24 hour  Intake    180 ml  Output    425 ml  Net   -245 ml   LBM:   Baseline Weight: Weight: 43.681 kg (96 lb 4.8 oz) Most recent weight: Weight: 42.33 kg (93 lb 5.1 oz)  Physical Exam: General: Frail, elderly-appearing female lying in bed in no acute distress. Continued apnea and shallow  respirations. She does not arouse during encounter Heart:  Irregular. No murmur appreciated. No palpable radial pulse. Lungs: Diminished throughout. Minimal air movement. Abdomen: Soft, nontender, nondistended.  Ext: No significant edema Skin: Lesions of lower extremity noted. Currently wrapped. Neuro: She remains nonresponsive to verbal and tactile stimulation              Additional Data Reviewed: No results for input(s): WBC, HGB, PLT, NA, BUN, CREATININE in the last 72 hours.  Invalid input(s): ALB   Problem List:  Patient Active Problem List   Diagnosis Date Noted  . Pressure ulcer 03/21/2015  . DNR (do not resuscitate)   . Atrial fibrillation with rapid ventricular response ([place]) 03/12/2015  . Chronic pain 03/04/2015  . Respiratory failure ([place]) 02/28/2015  . Dyspnea   . Encounter for palliative care   . Hyponatremia 02/25/2015  . Atrial flutter ([place]) 02/25/2015  . Atrial tachycardia ([place]) 01/25/2015  . COPD with acute exacerbation ([place]) 01/25/2015  . Acute on chronic respiratory failure with hypoxia ([place]) 01/25/2015  . Palliative care encounter   . Oropharyngeal candidiasis 01/08/2015  . Headache disorder 09/24/2014  . Carotid stenosis 09/24/2014  . Acute and chronic respiratory failure with hypoxia ([place]) 06/23/2014  . Transient blindness of right eye 03/03/2014  . Chronic respiratory failure with hypoxia ([place]) 07/20/2013  . Cardiomyopathy, ischemic - mild to moderate. EF ~45% 01/13/2013  . CAD (coronary artery disease), autologous vein bypass graft, cath 01/12/13 with Xience DES to VG-RCA 01/13/2013    Class:  Acute  . Protein-calorie malnutrition, severe ([place]) 01/11/2013  . Acute myocardial infarction, unspecified site, episode of care unspecified   . Esophageal dysmotility   . Esophageal stricture   . Peripheral neuropathy ([place])   . C. difficile diarrhea   . PVD (peripheral vascular disease)- Rt SCA PTA   . Hypertension   . Hx of radiation therapy and chemo  therapy   . 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 End stage COPD ([place]) 10/27/2011  . Hyperlipidemia 07/31/2007  . Anxiety state 07/31/2007  . Depression 07/31/2007  . GERD 07/31/2007  . PEPTIC ULCER DISEASE 07/31/2007  . GASTRITIS, CHRONIC 03/17/2006  . HIATAL HERNIA 03/17/2006  . POLYP, COLON 01/21/2005  . HEMORRHOIDS 01/21/2005     Palliative Care Assessment & Plan    Code Status:  DNR  Goals of Care:  Is enrolled in hospice with [facility]. Her desire has been to remain at home as long as possible, but to transition to hospital or residential hospice at the time of her death.  Patient is actively dying. Comfort care only.  Symptom Management:  Dyspnea: Continue aggressive management (currently through continuous infusion of morphine). She was in significant distress on presentation to ED.  She is currently unresponsive and appears to be resting comfortably.   Anxiety: Ativan as needed  Excess secretions: Robinul as needed  Agitation: Haldol as needed  Psycho-social/Spiritual:  Desire for further Chaplaincy support: Family has been in contact with chaplain services.   Prognosis: Hours - Days Discharge Planning:  She is currently enrolled with hospice and palliative care of [facility] and is under their care this admission.  She is actively dying and this will be a terminal admission.   Care plan was discussed with bedside nurse  Thank you for allowing the Palliative Medicine Team to assist in the care of this patient.   Time In: 0840 Time Out: 0900 Total Time 20 Prolonged Time Billed no    Greater than 50%  of this time was spent counseling and coordinating care related to the above assessment and plan.   Micheline Rough, MD  04-01-15, 9:01 AM  Please contact Palliative Medicine Team phone at [phone] for questions and concerns.

## 2015-03-25 NOTE — Discharge Summary (Signed)
Discharge Summary  [name] INO:[id number] DOB: 06/26/1950  PCP: Tivis Ringer, MD  Admit date: 2015/03/27  time of death : 9:35am  2015-03-30  Time spent: >30mns   Discharge Diagnoses:  Active Hospital Problems   Diagnosis Date Noted  . COPD with acute exacerbation ([place] 01/25/2015  . Pressure ulcer 03/21/2015  . DNR (do not resuscitate)   . Atrial fibrillation with rapid ventricular response ([place] 1November 03, 2016 . Chronic pain 111/03/16 . Respiratory failure ([facility] 111/03/16 . Encounter for palliative care   . Protein-calorie malnutrition, severe ([place] 01/11/2013  . Hx of radiation therapy and chemo therapy   . 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 End stage COPD ([facility] 10/27/2011    Resolved Hospital Problems   Diagnosis Date Noted Date Resolved  No resolved problems to display.     Filed Weights   [id number] 03/22/15 0500  Weight: 96 lb 4.8 oz (43.681 kg) 93 lb 5.1 oz (42.33 kg)    History of present illness:  Patient is a 64year old female with PMH of stage III NSCLC, end-stage COPD, oxygen dependence, chronic pain, [facility] who presented to the emergency department with shortness of breath from home. Patient has had multiple hospitalizations in the last 3 months. Most recent was 10/04-10/06 and discharged with residential hospice. Patient declines to have any interventions or lab studies and requests only that we treat her shortness of breath and her pain. Patient complains of extreme pain in her lower back, chest, and face. She reports blisters of the lower extremities bilaterally which have been weeping constantly. Patient [facility] in the past.  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 Family unable to manage patient at home and patient is requesting only comfort care measures. Palliative care followed patient entire hospitalization, patient was kept on full comfort measures, patient too frail to tolerate transfer to residential hospice facility, she expired at 9:35am on 12024/11/06 Cause of death: end stage copd.  Procedures:  none  Consultations:  Palliative care   Discharge Instructions You were cared for by a hospitalist during your hospital stay. If you have any questions about your discharge medications or the care you received while you were in the hospital after you are discharged, you can call the unit and asked to speak with the hospitalist on call if the hospitalist that took care of you is not available. Once you are discharged, your primary care physician will handle any further medical issues. Please note that NO REFILLS for any discharge medications will be authorized once you are discharged, as it is imperative that you return to your primary care physician (or establish a relationship with a primary care physician if you do not have one) for your aftercare needs so that they can reassess your need for medications and monitor your lab values.    Allergies  Allergen Reactions  . Amiodarone Shortness  Of Breath and Other (See Comments)    Drops HR too low, fatigue  . Clopidogrel Bisulfate Hives  . Dilaudid [Hydromorphone Hcl] Other (See Comments)    "I couldn't function for 2 days." lethargy  . Multaq [Dronedarone] Other (See Comments)    Heart races   . Tape Itching and Rash    Tears skin.  Please use "paper" tape       The results of significant diagnostics from this hospitalization (including imaging, microbiology, ancillary and laboratory) are listed below for reference.    Significant Diagnostic Studies: Dg Chest Portable 1 View  02/25/2015  CLINICAL DATA:  Shortness of breath. EXAM: PORTABLE CHEST 1 VIEW COMPARISON:  CT scan and chest x-ray dated 01/25/2015 and chest x-rays dated 01/09/2015 and 06/23/2014 FINDINGS: The lungs are hyperinflated with flattening of the diaphragm. Slight scarring at the right base, stable. Heart size and pulmonary vascularity are normal. No infiltrates or effusions. No pneumothorax. No acute osseous abnormality. Previous CABG. IMPRESSION: Emphysema.  No acute abnormalities. Electronically Signed   By: Lorriane Shire M.D.   On: 02/25/2015 13:37    Microbiology: No results found for this or any previous visit (from the past 240 hour(s)).   Labs: Basic Metabolic Panel: No results for input(s): NA, K, CL, CO2, GLUCOSE, BUN, CREATININE, CALCIUM, MG, PHOS in the last 168 hours. Liver Function Tests: No results for input(s): AST, ALT, ALKPHOS, BILITOT, PROT, ALBUMIN in the last 168 hours. No results for input(s): LIPASE, AMYLASE in the last 168 hours. No results for input(s): AMMONIA in the last 168 hours. CBC: No results for input(s): WBC, NEUTROABS, HGB, HCT, MCV, PLT in the last 168 hours. Cardiac Enzymes: No results for input(s): CKTOTAL, CKMB, CKMBINDEX, TROPONINI in the last 168 hours. BNP: BNP (last 3 results)  Recent Labs  01/08/15 0918 01/25/15 1006 02/25/15 1254  BNP 261.2* 634.8* 1406.6*    ProBNP (last 3 results) No results for input(s): PROBNP in the last 8760 hours.  CBG: No results for input(s): GLUCAP in the last 168 hours.     SignedFlorencia Reasons MD, PhD  [facility] 03/24/15, 12:39 PM

## 2015-03-25 NOTE — Progress Notes (Signed)
12m of morphine drip wasted with JOthelia Pulling RN.

## 2015-03-25 DEATH — deceased

## 2015-05-05 ENCOUNTER — Other Ambulatory Visit: Payer: Medicaid Other

## 2015-05-12 ENCOUNTER — Ambulatory Visit: Payer: Medicaid Other | Admitting: Internal Medicine

## 2015-08-08 ENCOUNTER — Other Ambulatory Visit: Payer: Self-pay | Admitting: Nurse Practitioner

## 2016-04-02 IMAGING — CT CT ANGIO CHEST
2 of 8 series · 18 of 36 positions shown · IV contrast (omnipaque)
Comparison: 10/05/2013, 06/19/2014, 08/04/2012

CLINICAL DATA: Shortness of breath.  COPD.

EXAM:
CT ANGIOGRAPHY CHEST WITH CONTRAST
TECHNIQUE: Multidetector CT imaging of the chest was performed using the
standard protocol during bolus administration of intravenous
contrast. Multiplanar CT image reconstructions and MIPs were
obtained to evaluate the vascular anatomy.
CONTRAST:  75mL OMNIPAQUE IOHEXOL 350 MG/ML SOLN

[Series 406: thins pacs · axial · 0.63mm/px · z∈[+2,+317]mm · 17 of 355 slices shown]
[im 20/355  lung]
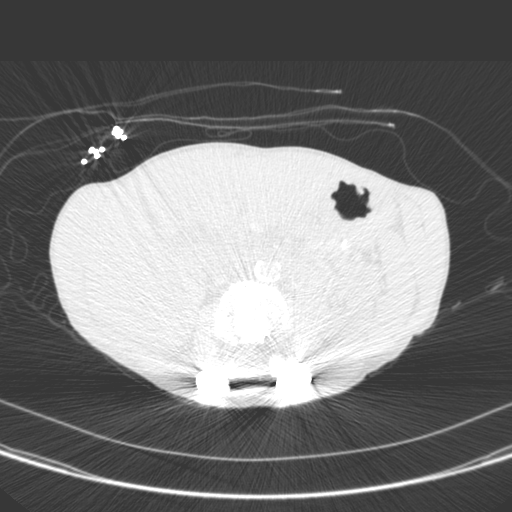
[im 40/355  mediastinal]
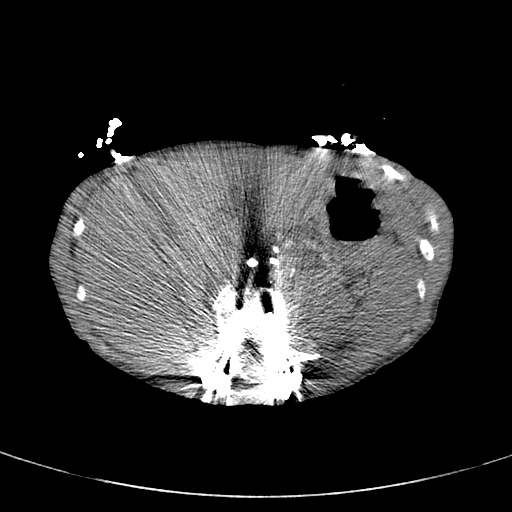
[im 60/355  lung]
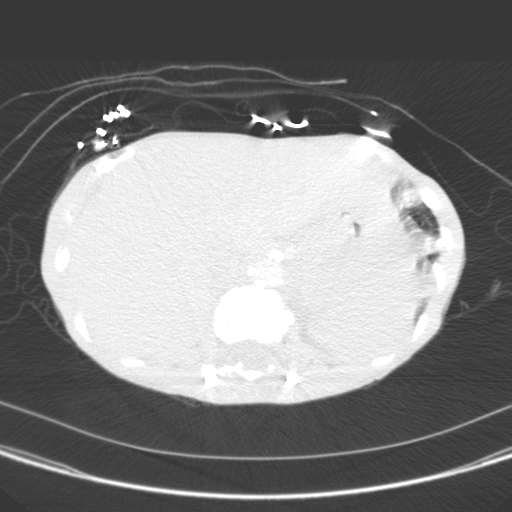
[im 79/355  mediastinal]
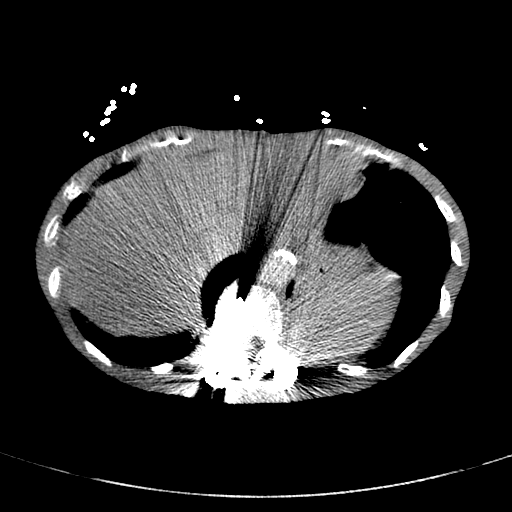
[im 99/355  lung]
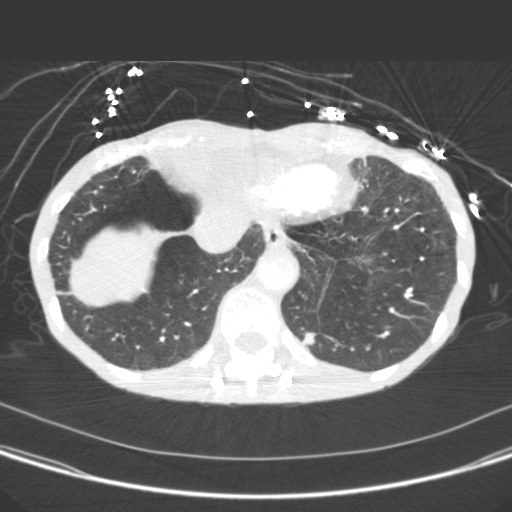
[im 119/355  mediastinal]
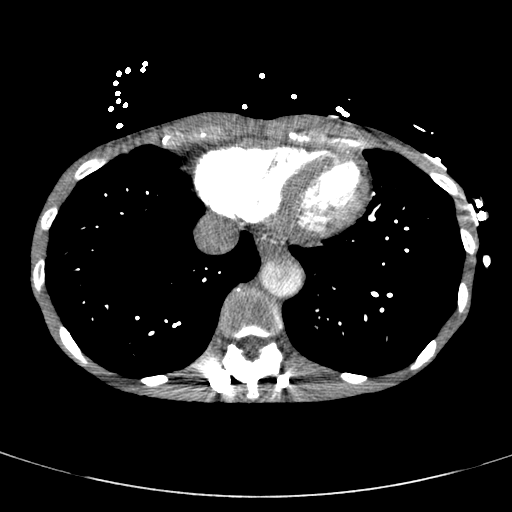
[im 138/355  lung]
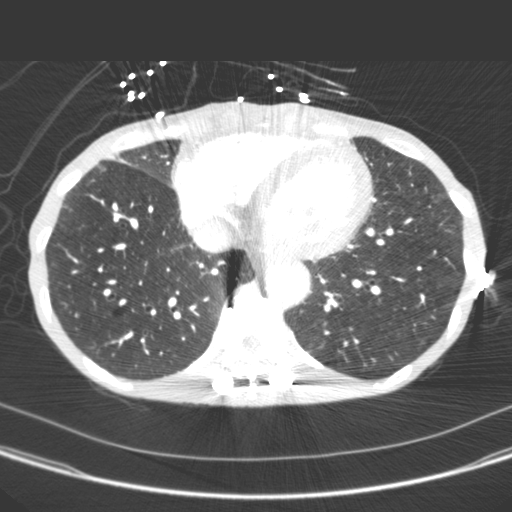
[im 158/355  mediastinal]
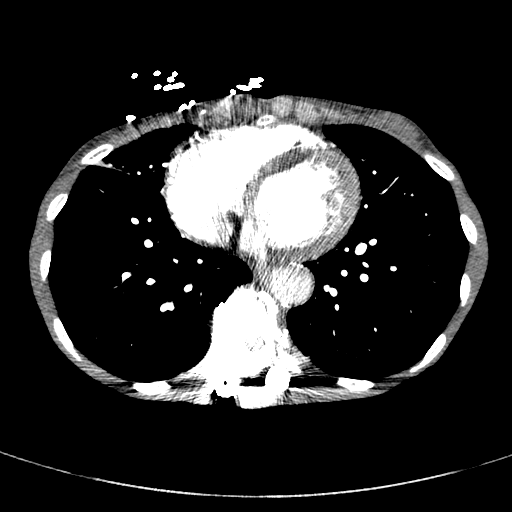
[im 178/355  lung]
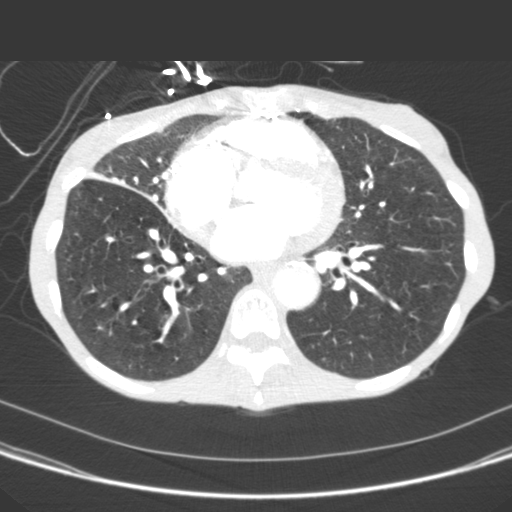
[im 197/355  mediastinal]
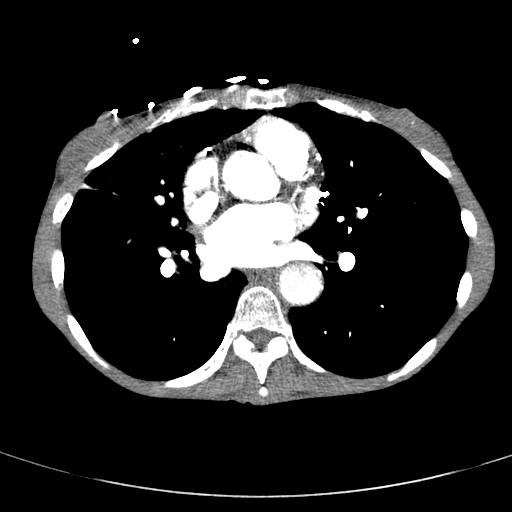
[im 217/355  lung]
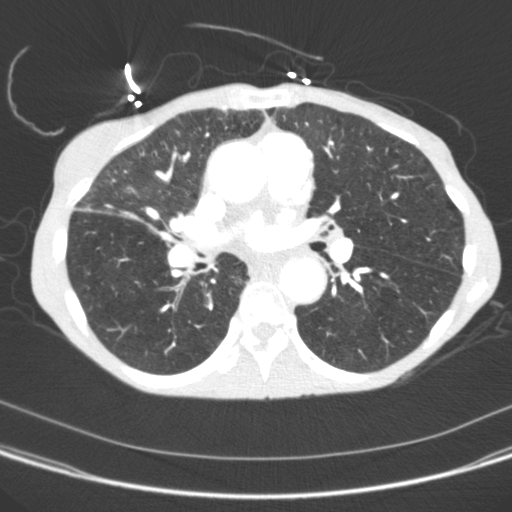
[im 237/355  mediastinal]
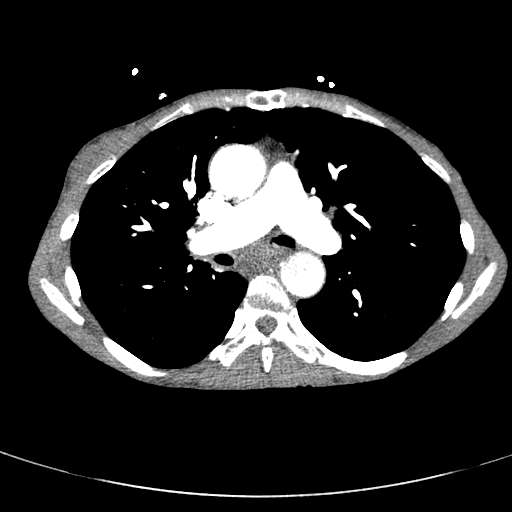
[im 256/355  lung]
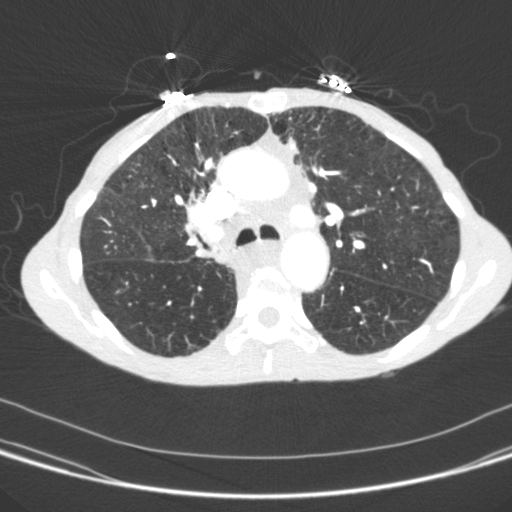
[im 276/355  mediastinal]
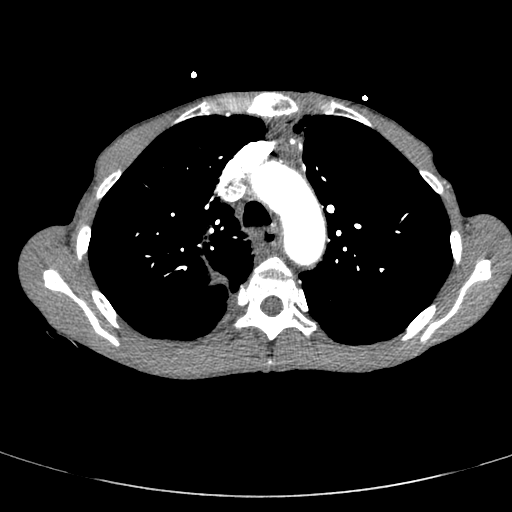
[im 296/355  lung]
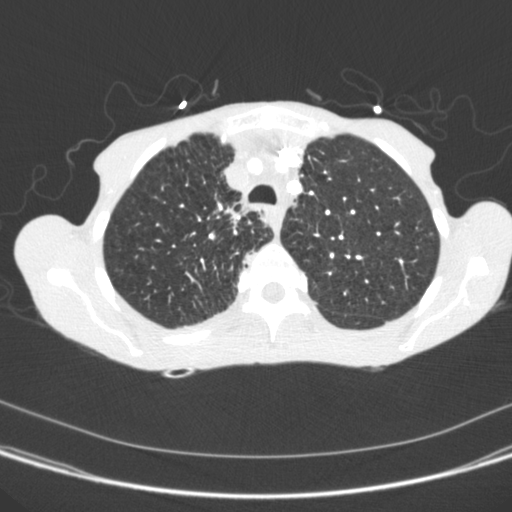
[im 315/355  mediastinal]
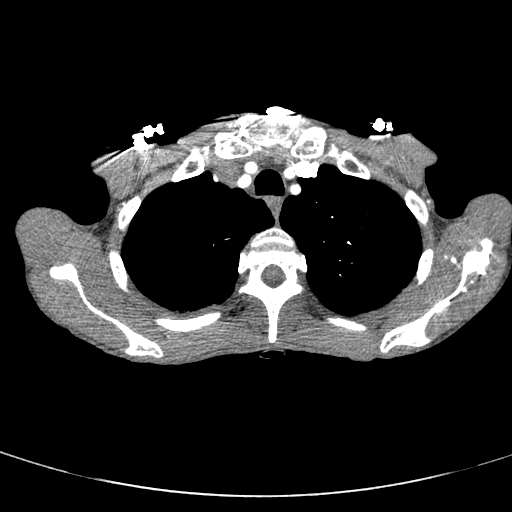
[im 335/355  lung]
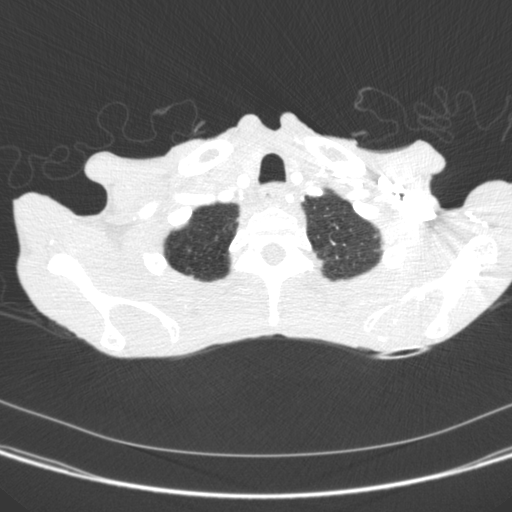

[Series 408: coronals · coronal · 0.63mm/px · 1 of 100 slices shown]
[im 50/100  mediastinal]
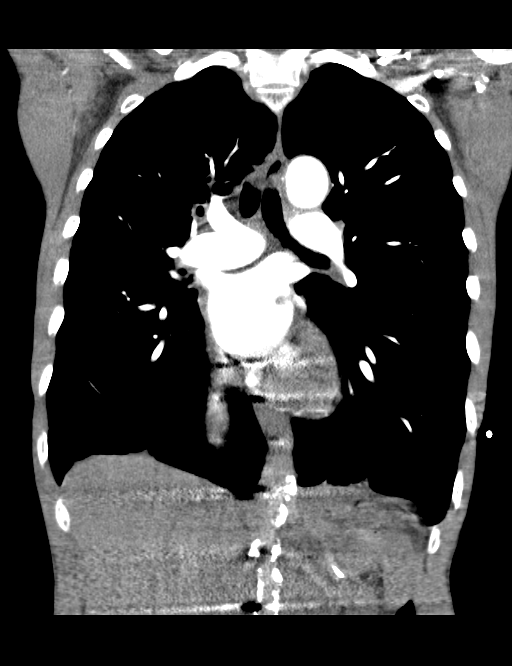

[18 of 36 positions shown; findings below may reference images not displayed]

FINDINGS: There is adequate opacification of the pulmonary arteries. There is
no pulmonary embolus. The main pulmonary artery, right main
pulmonary artery and left main pulmonary arteries are normal in
size. The heart size is normal. There is no pericardial effusion.
Normal caliber thoracic aorta. Scratched There is evidence of prior
CABG. There is a patent left subclavian artery stents.

There is bilateral centrilobular emphysema. Stable spiculated nodule
in the right upper lobe which is unchanged compared with the prior
exam measuring 10 x 8 mm. Extending from the spiculated nodule is a
subpleural soft tissue density along the right major fissure
measuring 18 x 12 mm, unchanged compared with the prior exam of
10/05/2013. There is mild left basilar scarring. There is no focal
consolidation, pleural effusion or pneumothorax.

There is esophageal wall thickening which may reflect esophagitis.

There is no axillary, hilar, or mediastinal adenopathy.

There is no lytic or blastic osseous lesion. Partially visualized is
posterior thoracolumbar fusion. There is a chronic T10 vertebral
body compression fracture.

The visualized portions of the upper abdomen are unremarkable.

Review of the MIP images confirms the above findings.
IMPRESSION: 1. No evidence of pulmonary embolus.
2. Esophageal wall thickening as can be seen with esophagitis.
3. Stable spiculated nodule and subpleural soft tissue density along
the right major fissure.

## 2016-05-03 IMAGING — CR DG CHEST 1V PORT
1 series · 1 of 1 positions shown · non-contrast
Comparison: CT scan and chest x-ray dated 01/25/2015 and chest
x-rays dated 01/09/2015 and 06/23/2014

CLINICAL DATA: Shortness of breath.

EXAM:
PORTABLE CHEST 1 VIEW

[AP]
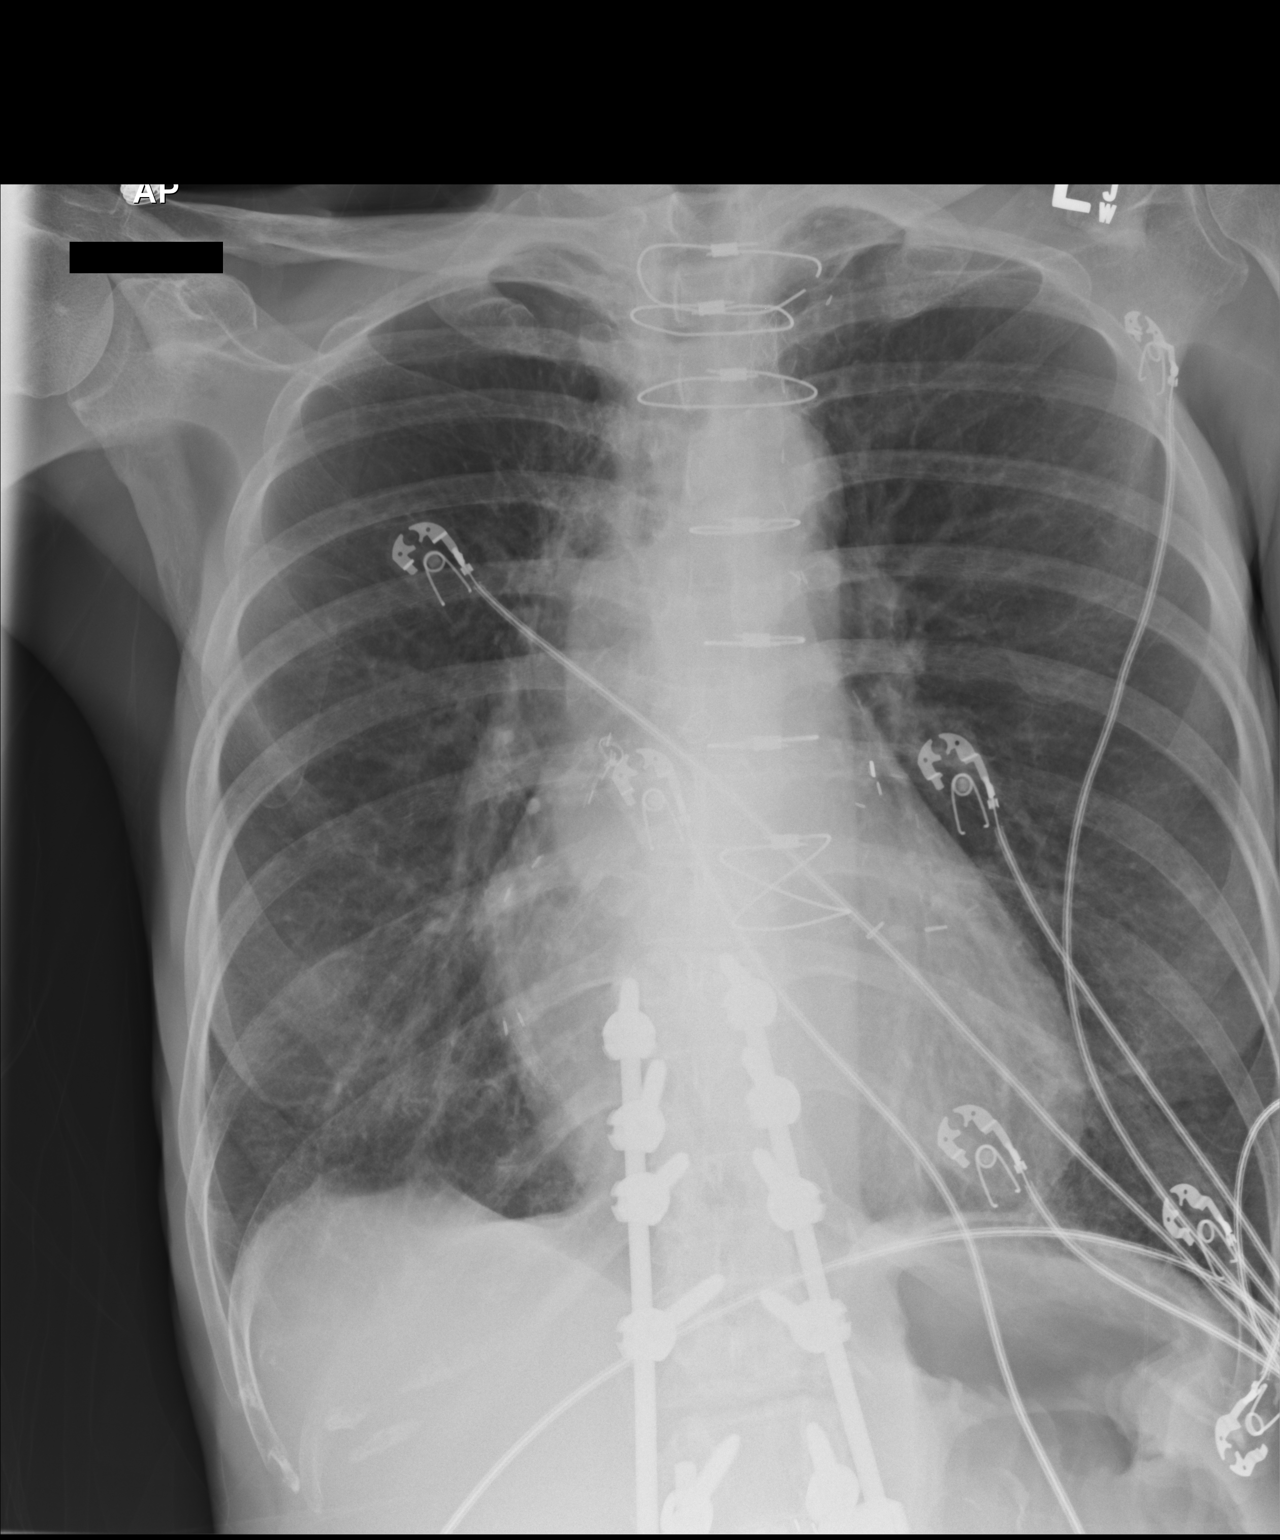

[1 of 1 positions shown; findings below may reference images not displayed]

FINDINGS: The lungs are hyperinflated with flattening of the diaphragm. Slight
scarring at the right base, stable. Heart size and pulmonary
vascularity are normal. No infiltrates or effusions. No
pneumothorax. No acute osseous abnormality. Previous CABG.
IMPRESSION: Emphysema.  No acute abnormalities.
